# Patient Record
Sex: Female | Born: 1945 | Race: White | Hispanic: No | Marital: Married | Smoking: Former smoker
Health system: Southern US, Community
[De-identification: ages and names within clinical notes are randomized; demographics above are authoritative.]

## PROBLEM LIST (undated history)

## (undated) DIAGNOSIS — K56609 Unspecified intestinal obstruction, unspecified as to partial versus complete obstruction: Secondary | ICD-10-CM

## (undated) DIAGNOSIS — M545 Low back pain, unspecified: Secondary | ICD-10-CM

## (undated) DIAGNOSIS — F329 Major depressive disorder, single episode, unspecified: Secondary | ICD-10-CM

## (undated) DIAGNOSIS — T8859XA Other complications of anesthesia, initial encounter: Secondary | ICD-10-CM

## (undated) DIAGNOSIS — M1990 Unspecified osteoarthritis, unspecified site: Secondary | ICD-10-CM

## (undated) DIAGNOSIS — K589 Irritable bowel syndrome without diarrhea: Secondary | ICD-10-CM

## (undated) DIAGNOSIS — K5790 Diverticulosis of intestine, part unspecified, without perforation or abscess without bleeding: Secondary | ICD-10-CM

## (undated) DIAGNOSIS — G43909 Migraine, unspecified, not intractable, without status migrainosus: Secondary | ICD-10-CM

## (undated) DIAGNOSIS — M542 Cervicalgia: Secondary | ICD-10-CM

## (undated) DIAGNOSIS — S82202A Unspecified fracture of shaft of left tibia, initial encounter for closed fracture: Secondary | ICD-10-CM

## (undated) DIAGNOSIS — E7800 Pure hypercholesterolemia, unspecified: Secondary | ICD-10-CM

## (undated) DIAGNOSIS — K635 Polyp of colon: Secondary | ICD-10-CM

## (undated) DIAGNOSIS — J45909 Unspecified asthma, uncomplicated: Secondary | ICD-10-CM

## (undated) DIAGNOSIS — F419 Anxiety disorder, unspecified: Secondary | ICD-10-CM

## (undated) DIAGNOSIS — F32A Depression, unspecified: Secondary | ICD-10-CM

## (undated) DIAGNOSIS — J189 Pneumonia, unspecified organism: Secondary | ICD-10-CM

## (undated) DIAGNOSIS — G8929 Other chronic pain: Secondary | ICD-10-CM

## (undated) DIAGNOSIS — T4145XA Adverse effect of unspecified anesthetic, initial encounter: Secondary | ICD-10-CM

## (undated) DIAGNOSIS — I2510 Atherosclerotic heart disease of native coronary artery without angina pectoris: Secondary | ICD-10-CM

## (undated) HISTORY — PX: AUGMENTATION MAMMAPLASTY: SUR837

## (undated) HISTORY — PX: APPENDECTOMY: SHX54

## (undated) HISTORY — DX: Unspecified intestinal obstruction, unspecified as to partial versus complete obstruction: K56.609

## (undated) HISTORY — PX: NASAL SINUS SURGERY: SHX719

## (undated) HISTORY — PX: COLON SURGERY: SHX602

## (undated) HISTORY — DX: Polyp of colon: K63.5

## (undated) HISTORY — PX: BRACHIOPLASTY: SUR162

## (undated) HISTORY — DX: Pure hypercholesterolemia, unspecified: E78.00

## (undated) HISTORY — DX: Pneumonia, unspecified organism: J18.9

## (undated) HISTORY — PX: FRACTURE SURGERY: SHX138

## (undated) HISTORY — DX: Anxiety disorder, unspecified: F41.9

## (undated) HISTORY — DX: Unspecified osteoarthritis, unspecified site: M19.90

## (undated) HISTORY — DX: Irritable bowel syndrome, unspecified: K58.9

## (undated) HISTORY — PX: CARDIAC CATHETERIZATION: SHX172

## (undated) HISTORY — PX: PARTIAL COLECTOMY: SHX5273

## (undated) HISTORY — DX: Diverticulosis of intestine, part unspecified, without perforation or abscess without bleeding: K57.90

## (undated) HISTORY — PX: CHOLECYSTECTOMY OPEN: SUR202

## (undated) HISTORY — PX: EYE MUSCLE SURGERY: SHX370

## (undated) HISTORY — PX: TONSILLECTOMY: SUR1361

---

## 1998-04-03 ENCOUNTER — Encounter: Payer: Self-pay | Admitting: Family Medicine

## 1998-04-04 ENCOUNTER — Inpatient Hospital Stay (HOSPITAL_COMMUNITY): Admission: EM | Admit: 1998-04-04 | Discharge: 1998-04-05 | Payer: Self-pay | Admitting: Emergency Medicine

## 1999-01-29 ENCOUNTER — Other Ambulatory Visit: Admission: RE | Admit: 1999-01-29 | Discharge: 1999-01-29 | Payer: Self-pay | Admitting: *Deleted

## 2000-05-06 ENCOUNTER — Other Ambulatory Visit: Admission: RE | Admit: 2000-05-06 | Discharge: 2000-05-06 | Payer: Self-pay | Admitting: *Deleted

## 2001-11-23 ENCOUNTER — Other Ambulatory Visit: Admission: RE | Admit: 2001-11-23 | Discharge: 2001-11-23 | Payer: Self-pay | Admitting: Obstetrics and Gynecology

## 2002-11-30 ENCOUNTER — Other Ambulatory Visit: Admission: RE | Admit: 2002-11-30 | Discharge: 2002-11-30 | Payer: Self-pay | Admitting: Obstetrics and Gynecology

## 2004-02-18 ENCOUNTER — Other Ambulatory Visit: Admission: RE | Admit: 2004-02-18 | Discharge: 2004-02-18 | Payer: Self-pay | Admitting: Obstetrics and Gynecology

## 2005-01-09 ENCOUNTER — Ambulatory Visit (HOSPITAL_BASED_OUTPATIENT_CLINIC_OR_DEPARTMENT_OTHER): Admission: RE | Admit: 2005-01-09 | Discharge: 2005-01-09 | Payer: Self-pay | Admitting: Ophthalmology

## 2005-01-09 ENCOUNTER — Ambulatory Visit (HOSPITAL_COMMUNITY): Admission: RE | Admit: 2005-01-09 | Discharge: 2005-01-09 | Payer: Self-pay | Admitting: Ophthalmology

## 2005-05-14 ENCOUNTER — Other Ambulatory Visit: Admission: RE | Admit: 2005-05-14 | Discharge: 2005-05-14 | Payer: Self-pay | Admitting: Obstetrics and Gynecology

## 2006-09-23 ENCOUNTER — Encounter: Payer: Self-pay | Admitting: Internal Medicine

## 2006-09-23 ENCOUNTER — Other Ambulatory Visit: Admission: RE | Admit: 2006-09-23 | Discharge: 2006-09-23 | Payer: Self-pay | Admitting: Obstetrics & Gynecology

## 2006-10-07 ENCOUNTER — Encounter: Payer: Self-pay | Admitting: Internal Medicine

## 2007-05-23 ENCOUNTER — Ambulatory Visit: Payer: Self-pay | Admitting: Internal Medicine

## 2007-06-17 ENCOUNTER — Encounter: Payer: Self-pay | Admitting: Internal Medicine

## 2007-06-17 ENCOUNTER — Ambulatory Visit: Payer: Self-pay | Admitting: Internal Medicine

## 2007-06-17 LAB — HM COLONOSCOPY

## 2007-06-22 LAB — HM MAMMOGRAPHY: HM Mammogram: NORMAL

## 2007-08-11 ENCOUNTER — Encounter: Admission: RE | Admit: 2007-08-11 | Discharge: 2007-08-11 | Payer: Self-pay | Admitting: Family Medicine

## 2007-11-25 ENCOUNTER — Encounter: Payer: Self-pay | Admitting: Endocrinology

## 2007-11-25 ENCOUNTER — Other Ambulatory Visit: Admission: RE | Admit: 2007-11-25 | Discharge: 2007-11-25 | Payer: Self-pay | Admitting: Obstetrics and Gynecology

## 2008-03-16 ENCOUNTER — Ambulatory Visit: Payer: Self-pay | Admitting: Endocrinology

## 2008-03-16 DIAGNOSIS — Z8719 Personal history of other diseases of the digestive system: Secondary | ICD-10-CM | POA: Insufficient documentation

## 2008-03-16 DIAGNOSIS — E875 Hyperkalemia: Secondary | ICD-10-CM | POA: Insufficient documentation

## 2008-03-16 DIAGNOSIS — R51 Headache: Secondary | ICD-10-CM | POA: Insufficient documentation

## 2008-03-16 DIAGNOSIS — Z9189 Other specified personal risk factors, not elsewhere classified: Secondary | ICD-10-CM | POA: Insufficient documentation

## 2008-03-16 DIAGNOSIS — R519 Headache, unspecified: Secondary | ICD-10-CM | POA: Insufficient documentation

## 2008-03-16 DIAGNOSIS — R7309 Other abnormal glucose: Secondary | ICD-10-CM | POA: Insufficient documentation

## 2008-03-16 DIAGNOSIS — L659 Nonscarring hair loss, unspecified: Secondary | ICD-10-CM | POA: Insufficient documentation

## 2008-03-16 LAB — CONVERTED CEMR LAB
BUN: 18 mg/dL (ref 6–23)
CO2: 31 meq/L (ref 19–32)
Calcium: 9.6 mg/dL (ref 8.4–10.5)
Chloride: 107 meq/L (ref 96–112)
Cortisol, Plasma: 18.7 ug/dL
Cortisol, Plasma: 27 ug/dL
Creatinine, Ser: 1 mg/dL (ref 0.4–1.2)
Folate: 10.3 ng/mL
GFR calc Af Amer: 73 mL/min
GFR calc non Af Amer: 60 mL/min
Glucose, Bld: 102 mg/dL — ABNORMAL HIGH (ref 70–99)
Potassium: 4.2 meq/L (ref 3.5–5.1)
Sodium: 142 meq/L (ref 135–145)
Vitamin B-12: 564 pg/mL (ref 211–911)

## 2008-03-18 DIAGNOSIS — G43009 Migraine without aura, not intractable, without status migrainosus: Secondary | ICD-10-CM | POA: Insufficient documentation

## 2008-03-18 DIAGNOSIS — E7800 Pure hypercholesterolemia, unspecified: Secondary | ICD-10-CM | POA: Insufficient documentation

## 2008-05-01 ENCOUNTER — Inpatient Hospital Stay (HOSPITAL_COMMUNITY): Admission: EM | Admit: 2008-05-01 | Discharge: 2008-05-07 | Payer: Self-pay | Admitting: Emergency Medicine

## 2008-10-01 ENCOUNTER — Encounter: Payer: Self-pay | Admitting: Internal Medicine

## 2008-11-08 ENCOUNTER — Telehealth: Payer: Self-pay | Admitting: Internal Medicine

## 2008-11-23 ENCOUNTER — Ambulatory Visit (HOSPITAL_BASED_OUTPATIENT_CLINIC_OR_DEPARTMENT_OTHER): Admission: RE | Admit: 2008-11-23 | Discharge: 2008-11-23 | Payer: Self-pay | Admitting: Ophthalmology

## 2009-01-09 ENCOUNTER — Encounter: Payer: Self-pay | Admitting: Internal Medicine

## 2009-04-08 ENCOUNTER — Encounter: Payer: Self-pay | Admitting: Internal Medicine

## 2009-06-04 ENCOUNTER — Encounter: Payer: Self-pay | Admitting: Internal Medicine

## 2009-06-25 ENCOUNTER — Encounter: Payer: Self-pay | Admitting: Internal Medicine

## 2009-06-27 ENCOUNTER — Encounter: Payer: Self-pay | Admitting: Internal Medicine

## 2009-07-19 ENCOUNTER — Ambulatory Visit: Payer: Self-pay | Admitting: Internal Medicine

## 2009-07-19 DIAGNOSIS — M542 Cervicalgia: Secondary | ICD-10-CM | POA: Insufficient documentation

## 2009-07-24 ENCOUNTER — Telehealth: Payer: Self-pay | Admitting: Internal Medicine

## 2009-07-25 DIAGNOSIS — F329 Major depressive disorder, single episode, unspecified: Secondary | ICD-10-CM | POA: Insufficient documentation

## 2009-07-25 DIAGNOSIS — F32A Depression, unspecified: Secondary | ICD-10-CM | POA: Insufficient documentation

## 2009-08-01 ENCOUNTER — Telehealth: Payer: Self-pay | Admitting: Internal Medicine

## 2009-08-08 ENCOUNTER — Ambulatory Visit: Payer: Self-pay | Admitting: Internal Medicine

## 2009-08-08 DIAGNOSIS — R030 Elevated blood-pressure reading, without diagnosis of hypertension: Secondary | ICD-10-CM | POA: Insufficient documentation

## 2009-08-08 LAB — CONVERTED CEMR LAB
BUN: 29 mg/dL — ABNORMAL HIGH (ref 6–23)
CO2: 30 meq/L (ref 19–32)
Calcium: 9.5 mg/dL (ref 8.4–10.5)
Chloride: 101 meq/L (ref 96–112)
Cholesterol: 221 mg/dL — ABNORMAL HIGH (ref 0–200)
Creatinine, Ser: 1 mg/dL (ref 0.4–1.2)
Direct LDL: 125.2 mg/dL
Free T4: 0.8 ng/dL (ref 0.6–1.6)
GFR calc non Af Amer: 59.76 mL/min (ref >60)
Glucose, Bld: 115 mg/dL — ABNORMAL HIGH (ref 70–99)
HDL: 77.2 mg/dL (ref >39.00)
Hgb A1c MFr Bld: 5.7 % (ref 4.6–6.5)
Potassium: 4 meq/L (ref 3.5–5.1)
Sodium: 139 meq/L (ref 135–145)
TSH: 0.57 microintl units/mL (ref 0.35–5.50)
Tissue Transglutaminase Ab, IgA: 0.5 units (ref <7)
Total CHOL/HDL Ratio: 3
Triglycerides: 56 mg/dL (ref 0.0–149.0)
VLDL: 11.2 mg/dL (ref 0.0–40.0)

## 2009-08-13 ENCOUNTER — Telehealth: Payer: Self-pay | Admitting: Internal Medicine

## 2009-09-03 ENCOUNTER — Telehealth: Payer: Self-pay | Admitting: Internal Medicine

## 2009-09-05 ENCOUNTER — Ambulatory Visit (HOSPITAL_COMMUNITY): Admission: RE | Admit: 2009-09-05 | Discharge: 2009-09-05 | Payer: Self-pay | Admitting: Otolaryngology

## 2009-09-05 ENCOUNTER — Encounter (INDEPENDENT_AMBULATORY_CARE_PROVIDER_SITE_OTHER): Payer: Self-pay | Admitting: Otolaryngology

## 2009-11-04 ENCOUNTER — Telehealth: Payer: Self-pay | Admitting: Internal Medicine

## 2009-11-12 ENCOUNTER — Ambulatory Visit: Payer: Self-pay | Admitting: Internal Medicine

## 2009-11-12 DIAGNOSIS — K589 Irritable bowel syndrome without diarrhea: Secondary | ICD-10-CM | POA: Insufficient documentation

## 2009-11-12 DIAGNOSIS — J309 Allergic rhinitis, unspecified: Secondary | ICD-10-CM | POA: Insufficient documentation

## 2009-12-13 ENCOUNTER — Telehealth: Payer: Self-pay | Admitting: Internal Medicine

## 2010-01-14 ENCOUNTER — Telehealth: Payer: Self-pay | Admitting: Internal Medicine

## 2010-01-18 ENCOUNTER — Encounter: Payer: Self-pay | Admitting: Internal Medicine

## 2010-04-24 ENCOUNTER — Ambulatory Visit: Payer: Self-pay | Admitting: Internal Medicine

## 2010-04-24 LAB — CONVERTED CEMR LAB
ALT: 14 units/L (ref 0–35)
AST: 19 units/L (ref 0–37)
Albumin: 4 g/dL (ref 3.5–5.2)
Alkaline Phosphatase: 47 units/L (ref 39–117)
BUN: 21 mg/dL (ref 6–23)
Basophils Absolute: 0 10*3/uL (ref 0.0–0.1)
Basophils Relative: 0.4 % (ref 0.0–3.0)
Bilirubin, Direct: 0.1 mg/dL (ref 0.0–0.3)
CO2: 31 meq/L (ref 19–32)
Calcium: 9.4 mg/dL (ref 8.4–10.5)
Chloride: 102 meq/L (ref 96–112)
Cholesterol: 189 mg/dL (ref 0–200)
Creatinine, Ser: 1 mg/dL (ref 0.4–1.2)
Eosinophils Absolute: 0.2 10*3/uL (ref 0.0–0.7)
Eosinophils Relative: 3.3 % (ref 0.0–5.0)
GFR calc non Af Amer: 58.94 mL/min (ref >60)
Glucose, Bld: 91 mg/dL (ref 70–99)
HCT: 39.3 % (ref 36.0–46.0)
HDL: 61.3 mg/dL (ref >39.00)
Hemoglobin: 13.2 g/dL (ref 12.0–15.0)
Hgb A1c MFr Bld: 5.8 % (ref 4.6–6.5)
LDL Cholesterol: 115 mg/dL — ABNORMAL HIGH (ref 0–99)
Lymphocytes Relative: 20.4 % (ref 12.0–46.0)
Lymphs Abs: 1.5 10*3/uL (ref 0.7–4.0)
MCHC: 33.6 g/dL (ref 30.0–36.0)
MCV: 92.9 fL (ref 78.0–100.0)
Monocytes Absolute: 0.5 10*3/uL (ref 0.1–1.0)
Monocytes Relative: 7.1 % (ref 3.0–12.0)
Neutro Abs: 5.2 10*3/uL (ref 1.4–7.7)
Neutrophils Relative %: 68.8 % (ref 43.0–77.0)
Platelets: 300 10*3/uL (ref 150.0–400.0)
Potassium: 4.8 meq/L (ref 3.5–5.1)
RBC: 4.23 M/uL (ref 3.87–5.11)
RDW: 12.5 % (ref 11.5–14.6)
Sodium: 140 meq/L (ref 135–145)
TSH: 0.67 microintl units/mL (ref 0.35–5.50)
Total Bilirubin: 0.5 mg/dL (ref 0.3–1.2)
Total CHOL/HDL Ratio: 3
Total Protein: 6.7 g/dL (ref 6.0–8.3)
Triglycerides: 66 mg/dL (ref 0.0–149.0)
VLDL: 13.2 mg/dL (ref 0.0–40.0)
WBC: 7.5 10*3/uL (ref 4.5–10.5)

## 2010-04-25 ENCOUNTER — Telehealth: Payer: Self-pay | Admitting: Internal Medicine

## 2010-04-25 ENCOUNTER — Ambulatory Visit: Payer: Self-pay | Admitting: Internal Medicine

## 2010-06-03 ENCOUNTER — Ambulatory Visit: Payer: Self-pay | Admitting: Internal Medicine

## 2010-06-03 ENCOUNTER — Encounter: Payer: Self-pay | Admitting: Internal Medicine

## 2010-06-10 ENCOUNTER — Encounter: Payer: Self-pay | Admitting: Internal Medicine

## 2010-06-10 ENCOUNTER — Ambulatory Visit: Payer: Self-pay | Admitting: Internal Medicine

## 2010-06-30 ENCOUNTER — Encounter: Payer: Self-pay | Admitting: Internal Medicine

## 2010-07-08 ENCOUNTER — Ambulatory Visit
Admission: RE | Admit: 2010-07-08 | Discharge: 2010-07-08 | Payer: Self-pay | Source: Home / Self Care | Attending: Internal Medicine | Admitting: Internal Medicine

## 2010-07-08 ENCOUNTER — Ambulatory Visit: Payer: Self-pay | Admitting: Internal Medicine

## 2010-07-08 DIAGNOSIS — M810 Age-related osteoporosis without current pathological fracture: Secondary | ICD-10-CM | POA: Insufficient documentation

## 2010-07-08 LAB — CONVERTED CEMR LAB
BUN: 16 mg/dL (ref 6–23)
Basophils Absolute: 0 10*3/uL (ref 0.0–0.1)
Basophils Relative: 0.1 % (ref 0.0–3.0)
CO2: 28 meq/L (ref 19–32)
Calcium: 9.3 mg/dL (ref 8.4–10.5)
Chloride: 102 meq/L (ref 96–112)
Creatinine, Ser: 0.7 mg/dL (ref 0.4–1.2)
Eosinophils Absolute: 0.2 10*3/uL (ref 0.0–0.7)
Eosinophils Relative: 1.5 % (ref 0.0–5.0)
GFR calc non Af Amer: 84.33 mL/min (ref >60.00)
Glucose, Bld: 84 mg/dL (ref 70–99)
HCT: 40.6 % (ref 36.0–46.0)
Hemoglobin: 13.6 g/dL (ref 12.0–15.0)
Lymphocytes Relative: 13 % (ref 12.0–46.0)
Lymphs Abs: 1.4 10*3/uL (ref 0.7–4.0)
MCHC: 33.6 g/dL (ref 30.0–36.0)
MCV: 91.4 fL (ref 78.0–100.0)
Monocytes Absolute: 1 10*3/uL (ref 0.1–1.0)
Monocytes Relative: 9.5 % (ref 3.0–12.0)
Neutro Abs: 8.4 10*3/uL — ABNORMAL HIGH (ref 1.4–7.7)
Neutrophils Relative %: 75.9 % (ref 43.0–77.0)
Platelets: 298 10*3/uL (ref 150.0–400.0)
Potassium: 4.2 meq/L (ref 3.5–5.1)
RBC: 4.45 M/uL (ref 3.87–5.11)
RDW: 12.3 % (ref 11.5–14.6)
Sodium: 138 meq/L (ref 135–145)
WBC: 11 10*3/uL — ABNORMAL HIGH (ref 4.5–10.5)

## 2010-07-11 ENCOUNTER — Telehealth: Payer: Self-pay | Admitting: Internal Medicine

## 2010-08-14 NOTE — Letter (Signed)
Summary: Date Range:04-03-09 to 06-27-09/[facility]  Date Range:04-03-09 to 06-27-09/[facility]   Imported By: Sherian Rein 09/02/2009 08:36:06  _____________________________________________________________________  External Attachment:    Type:   Image     Comment:   External Document

## 2010-08-14 NOTE — Assessment & Plan Note (Signed)
Summary: f/u BP / SD   Vital Signs:  Patient profile:   65 year old female Height:      62 inches Weight:      119 pounds BMI:     21.84 O2 Sat:      99 % on Room air Temp:     98.4 degrees F oral Pulse rate:   69 / minute BP sitting:   124 / 88  (left arm) Cuff size:   regular  O2 Flow:  Room air CC: Follow-up visit for BP/aj   Primary Care Provider:  Sherril Cong  CC:  Follow-up visit for BP/aj.  History of Present Illness: Patient returns to discuss BP readings (see scanned report) with a range from 110/62 to 151/ 86. There was a difference comparing right and left arms with the left arm running higher.  she is asymptomatic. These readings are tending to run higher than her historical levels. No driving factors.   Labs from outside have not been received. However, she believes that the last time her cholesterol was checked was in '09.   Her principal concern is with a loose bowel habit. She has not had a good response to fiber. She does have bloating and gas and borborygmi. She has no blood in her stool or mucus. this seems to be getting worse. She also has a heightened gastro-colic reflex. This is not food related. She is taking align and was taking an otc product for intestinal gas problems.   Seeing Dr. Haroldine Laws for chronic sinus disease and recurrent infections. He has proposed sinus surgery - to be done in the next 6 weeks.   Current Medications (verified): 1)  Fluoxetine Hcl 20 Mg Tabs (Fluoxetine Hcl) .... Take 1 By Mouth Qd 2)  Multivitamins  Tabs (Multiple Vitamin) .... Take 1 By Mouth Qd 3)  Alprazolam 0.5 Mg Tabs (Alprazolam) .... Take 1 At Bedtime 4)  Trazodone Hcl 50 Mg Tabs (Trazodone Hcl) .... Take 1 By Mouth At Bedtime 5)  Indomethacin 25 Mg Caps (Indomethacin) .... 4 Caps Daily 6)  Imitrex 6 Mg/0.74ml Soln (Sumatriptan Succinate) .... Subcutaneously Injection As Needed Migraine  Allergies (verified): No Known Drug Allergies PMH-FH-SH reviewed-no changes  except otherwise noted  Review of Systems GI:  Complains of abdominal pain, change in bowel habits, diarrhea, gas, and nausea.  Physical Exam  General:  Well-developed,well-nourished,in no acute distress; alert,appropriate and cooperative throughout examination Head:  normocephalic and atraumatic.   Eyes:  pupils equal, pupils round, corneas and lenses clear, and no injection.   Lungs:  normal respiratory effort and normal breath sounds.   Heart:  normal rate and regular rhythm.   Neurologic:  alert & oriented X3, cranial nerves II-XII intact, and gait normal.   Skin:  turgor normal and color normal.   Psych:  Oriented X3, memory intact for recent and remote, and normally interactive.     Impression & Recommendations:  Problem # 1:  DIARRHEA (ICD-787.91)  Persistent for months. No blood or real pain making IBD unlikely. Concern for celiac disease vs IBS.  Plan - celiac panel           trial of dicyclomine 10mg  three times a day - may titrate up           if celiac panel negative and no improvement witth dicycylomine - GI referral.  Orders: T-Celiac Disease Ab Evaluation (8002) TLB-T4 (Thyrox), Free [phone]) TLB-TSH (Thyroid Stimulating Hormone) (84443-TSH)  Addendum - thyroid labs normal. Celiac panel pending  Problem # 2:  ELEVATED BP READING WITHOUT DX HYPERTENSION (ICD-796.2) Reviewed readings as well as thinkng on the treatment of pre-hypetension.  Plan - watchful waiting at this time.  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 Crossley  Problem # 4:  HYPERCHOLESTEROLEMIA (ICD-272.0)  No recent labs.  Plan - lipid panel with recommendations to follow.  Orders: TLB-Lipid Panel (80061-LIPID)  Addendum - HDL is very robust, LDL is less than 130 which is the goal, LDL/HDL ratio is protective. No need for any treatment  Complete Medication List: 1)  Fluoxetine Hcl 40 Mg Caps (Fluoxetine hcl) .Marland Kitchen.. 1 by mouth once daily 2)  Multivitamins Tabs  (Multiple vitamin) .... Take 1 by mouth qd 3)  Alprazolam 0.5 Mg Tabs (Alprazolam) .... Take 1 at bedtime 4)  Trazodone Hcl 50 Mg Tabs (Trazodone hcl) .... Take 1 by mouth at bedtime 5)  Indomethacin 25 Mg Caps (Indomethacin) .... 4 caps daily 6)  Imitrex 6 Mg/0.44ml Soln (Sumatriptan succinate) .... Subcutaneously injection as needed migraine 7)  Dicyclomine Hcl 10 Mg Caps (Dicyclomine hcl) .Marland Kitchen.. 1 by mouth three times a day; may titrate to symptoms  Other Orders: TLB-BMP (Basic Metabolic Panel-BMET) (80048-METABOL) TLB-A1C / Hgb A1C (Glycohemoglobin) (83036-A1C)  Patient: [name] Note: All result statuses are Final unless otherwise noted.  Tests: (1) Lipid Panel (LIPID)   Cholesterol          [H]  221 mg/dL                   1-610     ATP III Classification            Desirable:  < 200 mg/dL                    Borderline High:  200 - 239 mg/dL               High:  > = 240 mg/dL   Triglycerides             56.0 mg/dL                  9.6-045.4     Normal:  <150 mg/dL     Borderline High:  098 - 199 mg/dL   HDL                       11.91 mg/dL                 >47.82   VLDL Cholesterol          11.2 mg/dL                  9.5-62.1  CHO/HDL Ratio:  CHD Risk                             3                    Men          Women     1/2 Average Risk     3.4          3.3     Average Risk          5.0          4.4     2X Average Risk          9.6  7.1     3X Average Risk          15.0          11.0                           Tests: (2) BMP (METABOL)   Sodium                    139 mEq/L                   135-145   Potassium                 4.0 mEq/L                   3.5-5.1   Chloride                  101 mEq/L                   96-112   Carbon Dioxide            30 mEq/L                    19-32   Glucose              [H]  115 mg/dL                   56-21   BUN                  [H]  29 mg/dL                    3-08   Creatinine                1.0 mg/dL                    6.5-7.8   Calcium                   9.5 mg/dL                   4.6-96.2   GFR                       59.76 mL/min                >60  Tests: (3) Hemoglobin A1C (A1C)   Hemoglobin A1C            5.7 %                       4.6-6.5     Glycemic Control Guidelines for People with Diabetes:     Non Diabetic:  <6%     Goal of Therapy: <7%     Additional Action Suggested:  >8%   Tests: (4) T4, Free (FT4R)   Free T4                   0.8 ng/dL                   9.5-2.8  Tests: (5) TSH (TSH)   FastTSH                   0.57 uIU/mL  0.35-5.50  Tests: (6) Cholesterol LDL - Direct (DIRLDL)  Cholesterol LDL - Direct                             125.2 mg/dL     Optimal:  <161 mg/dL     Near or Above Optimal:  100-129 mg/dL     Borderline High:  096-045 mg/dL     High:  409-811 mg/dL     Very High:  >914 mg/dLPrescriptions: ALPRAZOLAM 0.5 MG TABS (ALPRAZOLAM) take 1 at bedtime  #30 x 5   Entered and Authorized by:   Jacques Navy MD   Signed by:   Jacques Navy MD on 08/08/2009   Method used:   Handwritten   RxID:   [id number] FLUOXETINE HCL 40 MG CAPS (FLUOXETINE HCL) 1 by mouth once daily  #30 x 12   Entered and Authorized by:   Jacques Navy MD   Signed by:   Jacques Navy MD on 08/08/2009   Method used:   Electronically to        [facility]  [phone]* (retail)       [address]       [place], [place]  [address]       Ph: [phone] or [phone]       Fax: [phone]   RxID:   [id number] DICYCLOMINE HCL 10 MG CAPS (DICYCLOMINE HCL) 1 by mouth three times a day; may titrate to symptoms  #30 x 1   Entered and Authorized by:   Jacques Navy MD   Signed by:   Jacques Navy MD on 08/08/2009   Method used:   Electronically to        [facility]  [phone]* (retail)       [address]       [place], [place]  [address]       Ph: [phone] or [phone]       Fax:  [phone]   RxID:   [id number]    Immunization History:  Influenza Immunization History:    Influenza:  historical (04/12/2009)

## 2010-08-14 NOTE — Progress Notes (Signed)
Summary: Refill--Alprazolam  Phone Note Refill Request Message from:  Fax from Pharmacy on December 13, 2009 1:51 PM  Refills Requested: Medication #1:  ALPRAZOLAM 0.5 MG TABS 1 by mouth bid.   Supply Requested: 3 months Next Appointment Scheduled: none Initial call taken by: Lucious Groves,  December 13, 2009 1:51 PM  Follow-up for Phone Call        ok Follow-up by: Etta Grandchild MD,  December 13, 2009 2:02 PM  Additional Follow-up for Phone Call Additional follow up Details #1::        done  Additional Follow-up by: Lucious Groves,  December 13, 2009 3:44 PM    Prescriptions: ALPRAZOLAM 0.5 MG TABS (ALPRAZOLAM) 1 by mouth bid  #60 x 0   Entered by:   Lucious Groves   Authorized by:   Etta Grandchild MD   Signed by:   Lucious Groves on 12/13/2009   Method used:   Telephoned to ...       [facility]  [address]  [phone]* (retail)       [address]       [place], [place]  [address]       Ph: [phone] or [phone]       Fax: [phone]   RxID:   [id number]

## 2010-08-14 NOTE — Progress Notes (Signed)
Summary: Results  Phone Note Outgoing Call   Reason for Call: Discuss lab or test results Summary of Call: please call patient: test for celiac disease, as a cause of diarrhea, was negative.  Thanks Initial call taken by: Jacques Navy MD,  August 13, 2009 3:55 AM  Follow-up for Phone Call        left message for pt to call back Follow-up by: Ami Bullins CMA,  August 13, 2009 8:26 AM  Additional Follow-up for Phone Call Additional follow up Details #1::        Patient notified, and wanted MD to know that the med given for IBS helped immediately. Patient would like to know how the rest of her labs were (i.e. cholesterol, etc.). Please advise. Additional Follow-up by: Lucious Groves,  August 13, 2009 11:31 AM    Additional Follow-up for Phone Call Additional follow up Details #2::    office note with all her labs has been mailed. She should receive it soon. Follow-up by: Jacques Navy MD,  August 13, 2009 1:31 PM  Additional Follow-up for Phone Call Additional follow up Details #3:: Details for Additional Follow-up Action Taken: pt informed via Hm VM. pt told to call back with any further questions or concerns Additional Follow-up by: Margaret Pyle, CMA,  August 13, 2009 2:21 PM

## 2010-08-14 NOTE — Assessment & Plan Note (Signed)
Summary: NEW / [employer] / # / CD   Vital Signs:  Patient profile:   65 year old female Height:      62 inches Weight:      119 pounds BMI:     21.84 O2 Sat:      97 % on Room air Temp:     97.9 degrees F oral Pulse rate:   70 / minute BP sitting:   138 / 86  (left arm) Cuff size:   regular  Vitals Entered By: Bill Salinas CMA (July 19, 2009 2:41 PM)  O2 Flow:  Room air CC: pt here to est care with primary/ ab   Primary Care Provider:  Sherril Cong  CC:  pt here to est care with primary/ ab.  History of Present Illness: Patient presents to establish for on-going continuity care. she has no specific medical problems. She does have arthritic problems back and neck. She does report frequent diarrhea -routine is BM every 2-3 days, but it will be watery and loose. She had colonosocopy '08 Dr. Juanda Chance - diverticulosis only. Reviewed lab s and endocrine work-up for alopecia and low energy: normal labs including A1C. Labs from '09 GYN office reviewed and normal except for LDL of 162 (goal of 130 or less). She has had recent labs by Dr. Uvaldo Rising results not available at this time.   Current Medications (verified): 1)  Fluoxetine Hcl 20 Mg Tabs (Fluoxetine Hcl) .... Take 1 By Mouth Qd 2)  Multivitamins  Tabs (Multiple Vitamin) .... Take 1 By Mouth Qd 3)  Alprazolam 0.5 Mg Tabs (Alprazolam) .... Take 1 At Bedtime 4)  Trazodone Hcl 50 Mg Tabs (Trazodone Hcl) .... Take 1 By Mouth At Bedtime 5)  Indomethacin 25 Mg Caps (Indomethacin) .... 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 Maple Hudson (pediatric opthal)                GI- Dr. Lina Sar                Ortho - Dr. Darrelyn Hillock                ENT - Dr. 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  marriage in good health.   Review of Systems       The patient complains of incontinence.  The patient denies anorexia, fever, weight loss, weight gain, vision loss, decreased hearing, chest pain, syncope, dyspnea on exertion, peripheral edema, hemoptysis, abdominal pain, melena, severe indigestion/heartburn, difficulty walking, depression, enlarged  lymph nodes, and breast masses.         stress incontinence.  Physical Exam  General:  Well-developed,well-nourished,in no acute distress; alert,appropriate and cooperative throughout examination Head:  Normocephalic and atraumatic without obvious abnormalities. No apparent alopecia or balding. Eyes:  No corneal or conjunctival inflammation noted. EOMI. Perrla. Funduscopic exam benign, without hemorrhages, exudates or papilledema. Vision grossly normal. Ears:  External ear exam shows no significant lesions or deformities.  Otoscopic examination reveals clear canals, tympanic membranes are intact bilaterally without bulging, retraction, inflammation or discharge. Hearing is grossly normal bilaterally. Nose:  External nasal examination shows no deformity or inflammation. Nasal mucosa are pink and moist without lesions or exudates. Mouth:   Oral mucosa and oropharynx without lesions or exudates.  Teeth in good repair. Neck:  supple, full ROM, and no masses.   Chest Wall:  no deformities and no tenderness.   Breasts:  deferred Lungs:  Normal respiratory effort, chest expands symmetrically. Lungs are clear to auscultation, no crackles or wheezes. Heart:  Normal rate and regular rhythm. S1 and S2 normal without gallop, murmur, click, rub or other extra sounds. Abdomen:  soft, non-tender, normal bowel sounds, no distention, no guarding, no rigidity, and no hepatomegaly.   Genitalia:  deferred Msk:  normal ROM, no joint tenderness, no joint swelling, no joint warmth, no joint deformities, and no joint instability.   Pulses:  2+ radial and DP pulses Extremities:  No clubbing, cyanosis, edema, or deformity noted with normal full range of motion of all joints.   Neurologic:  alert & oriented X3, cranial nerves II-XII intact, strength normal in all extremities, gait normal, and DTRs symmetrical and normal.   Skin:  turgor normal, color normal, no rashes, no petechiae, and no ulcerations.   Cervical Nodes:  No lymphadenopathy noted Psych:  Cognition and judgment appear intact. Alert and cooperative with normal attention span and concentration. No apparent delusions, illusions, hallucinations   Impression & Recommendations:  Problem # 1:  LOW BACK PAIN, CHRONIC (ICD-724.2) Patient is not in significant pain at today's visit and has normal ROM and movement.  Her updated medication list for this problem includes:    Indomethacin 25 Mg Caps (Indomethacin) .Marland KitchenMarland KitchenMarland KitchenMarland Kitchen 4 caps daily  Problem # 2:  HYPERCHOLESTEROLEMIA (ICD-272.0) Reviewed labs from '06 scanned in from Gyn office: Total cholesterol 259, HDL 80, LDL 161. Need to review recent labs at her previous physicians. If no recent lipid profile will need to order the same with recommendations to follow in either case. There is a strong positive family history of athersclerosis related  disease.  Problem # 3:  HYPERGLYCEMIA (ICD-790.29) Reviewed labs from '06 scanned in from gyn office: A1C 5.9% which is normal. there is a positive family history of diabetes. Will need recent serum glucose or A1C.  Problem # 4:  COMMON MIGRAINE (ICD-346.10) Existing diagnosis. No 5HT products on med list. For recurrent headache, if migraine in nature will try sumatriptan or similar product.  Her updated medication list for this problem includes:    Indomethacin 25 Mg Caps (Indomethacin) .Marland KitchenMarland KitchenMarland KitchenMarland Kitchen 4 caps daily  Problem # 5:  DIVERTICULITIS, HX OF (ICD-V12.79) No current symptoms. Last colonoscopy Dec '08.  Problem # 6:  Preventive Health Care (ICD-V70.0) Unremarkable exam. Current with colorectal cancer screening and breast cancer screening. She is current with her gynecologist. Labs recently done have been requested.  In summary - a very pleasant woman whose mother and sister were patients here who is now established for on-going care. Will review  her outside labs when available. She will return  in 4-8 weeks for follow -up.  Complete Medication List: 1)  Fluoxetine Hcl 20 Mg Tabs (Fluoxetine hcl) .... Take 1 by mouth qd 2)  Multivitamins Tabs (Multiple vitamin) .... Take 1 by mouth qd 3)  Alprazolam 0.5 Mg Tabs (Alprazolam) .... Take 1 at bedtime 4)  Trazodone Hcl 50 Mg Tabs (Trazodone hcl) .... Take 1 by mouth at bedtime 5)  Indomethacin 25 Mg Caps (Indomethacin) .... 4 caps daily   Preventive Care Screening  Colonoscopy:    Date:  06/17/2007    Results:  Diverticulosis

## 2010-08-14 NOTE — Letter (Signed)
Summary: [facility]  [facility]   Imported By: Lester [place] 01/22/2010 09:12:05  _____________________________________________________________________  External Attachment:    Type:   Image     Comment:   External Document

## 2010-08-14 NOTE — Miscellaneous (Signed)
Summary: Pt Vaccination Record/[facility]  Pt Vaccination Record/[facility]   Imported By: Sherian Rein 09/02/2009 08:32:59  _____________________________________________________________________  External Attachment:    Type:   Image     Comment:   External Document

## 2010-08-14 NOTE — Assessment & Plan Note (Signed)
Summary: OV--PER PT---STC   Vital Signs:  Patient profile:   65 year old female Height:      63 inches Weight:      122 pounds BMI:     21.69 O2 Sat:      98 % on Room air Temp:     99.2 degrees F oral Pulse rate:   83 / minute BP sitting:   148 / 90  (left arm) Cuff size:   regular  Vitals Entered By: Bill Salinas CMA (July 08, 2010 1:02 PM)  O2 Flow:  Room air CC: pt here with c/o weakness, fatigue, chills, sore throat and productive cough (producing green mucous) x 1 weeks with a fever./ ab   Primary Care Provider:  Jacques Navy MD  CC:  pt here with c/o weakness, fatigue, chills, and sore throat and productive cough (producing green mucous) x 1 weeks with a fever./ ab.  History of Present Illness: Patient presents with a 7-10 h/o high fevers to 103, cough-productive of thick and dark sputum. increased SOB, rigors. She gives a history of pneumonia in the past and that this feels similar. Denies any nausea or vomiting. she has had a sore, feels like her neck glands are swollen, she has pain in the ears. She has been very weak and bedbound for several days. She has been taking APAP for fever, no other OTC meds, no cough syrups or preparations.  Current Medications (verified): 1)  Fluoxetine Hcl 40 Mg Caps (Fluoxetine Hcl) .Marland Kitchen.. 1 By Mouth Once Daily 2)  Multivitamins  Tabs (Multiple Vitamin) .... Take 1 By Mouth Qd 3)  Trazodone Hcl 50 Mg Tabs (Trazodone Hcl) .... Take 1 By Mouth At Bedtime 4)  Indomethacin 25 Mg Caps (Indomethacin) .Marland Kitchen.. 1 Cap Daily 5)  Imitrex 6 Mg/0.51ml Soln (Sumatriptan Succinate) .... Subcutaneously Injection As Needed Migraine 6)  Alprazolam 0.5 Mg Tabs (Alprazolam) .Marland Kitchen.. 1 By Mouth Bid 7)  Valacyclovir Hcl 1 Gm Tabs (Valacyclovir Hcl) .Marland Kitchen.. 1 Tablet Two Times A Day 8)  Nitrofurantoin Monohyd Macro 100 Mg Caps (Nitrofurantoin Monohyd Macro) .Marland Kitchen.. 1 Capsule Two Times A Day 9)  Maxalt 10 Mg  Tabs (Rizatriptan Benzoate) .... 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 Maple Hudson (pediatric opthal)                GI- Dr. Lina Sar                Ortho - Dr. Darrelyn Hillock                ENT - Dr. 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  marriage in good health.   Review of Systems       The patient complains of anorexia, fever, hoarseness, chest pain, dyspnea on exertion, and prolonged cough.  The patient denies weight loss, weight gain, decreased hearing, peripheral edema, hemoptysis, abdominal pain, severe indigestion/heartburn, muscle weakness, difficulty walking, abnormal bleeding, and enlarged lymph nodes.     Physical Exam  General:  Well-developed,well-nourished,in no acute distress; alert,appropriate and cooperative throughout examination Head:  normocephalic and atraumatic.  No tenderness to percussion over the frontal sinuses Eyes:  C&S clear Ears:  R ear normal and L ear normal.   Nose:  no external deformity, no external erythema, and no nasal discharge.   Mouth:  good dentition, pharynx pink and moist, no exudates, no posterior lymphoid hypertrophy, no tongue abnormalities, and no leukoplakia.   Msk:  no joint tenderness, no joint swelling, and no redness over joints.   Pulses:  2+radial Neurologic:  alert & oriented X3, cranial nerves II-XII intact, sensation intact to pinprick, and gait normal.   Skin:  turgor normal, color normal, no rashes, and no ecchymoses.   Cervical Nodes:  no anterior cervical adenopathy and no posterior cervical adenopathy.   Psych:  Oriented X3, memory intact for recent and remote, and normally interactive.     Impression & Recommendations:  Problem # 1:  COUGH (ICD-786.2)  patient with fever, cough, sob but clear lungs suggestive of bronchitis. Her is hx is remarkable for illness with leukocytosis and cough.  Plan PA-Lat CXR         Lab - CBCD, metabolic         Doxycycline 100mg  two times a day x10         robitussin DM         APAP, hydrate, vit C  Orders: TLB-CBC Platelet - w/Differential (85025-CBCD) TLB-BMP (Basic Metabolic Panel-BMET) (80048-METABOL) T-2 View CXR (71020TC)  addendum: CXr NAD                   WBC 11,000 with normal diff.  Problem # 2:  OSTEOPOROSIS (ICD-733.00) Reviewed patient DXA scan with and reviewed scoring system. she does have osteoporosis of the hip, one hip being  T-2.4 = osteopenia as well as osteopenia of the spine. Discussed treatment options.  Plan - alendronate 70 mg weekly.   Her updated medication list for this problem includes:    Alendronate Sodium 70 Mg Tabs (Alendronate sodium) .Marland Kitchen... 1 by mouth  weekly for bone loss  Complete Medication List: 1)  Fluoxetine Hcl 40 Mg Caps (Fluoxetine hcl) .Marland Kitchen.. 1 by mouth once daily 2)  Multivitamins Tabs (Multiple vitamin) .... Take 1 by mouth qd 3)  Trazodone Hcl 50 Mg Tabs (Trazodone hcl) .... Take 1 by mouth at bedtime 4)  Indomethacin 25 Mg Caps (Indomethacin) .Marland Kitchen.. 1 cap daily 5)  Imitrex 6 Mg/0.42ml Soln (Sumatriptan succinate) .... Subcutaneously injection as needed migraine 6)  Alprazolam 0.5 Mg Tabs (Alprazolam) .Marland Kitchen.. 1 by mouth bid 7)  Valacyclovir Hcl 1 Gm Tabs (Valacyclovir hcl) .Marland Kitchen.. 1 tablet two times a day 8)  Nitrofurantoin Monohyd Macro  100 Mg Caps (Nitrofurantoin monohyd macro) .Marland Kitchen.. 1 capsule two times a day 9)  Maxalt 10 Mg Tabs (Rizatriptan benzoate) .... Once daily as needed migraine 10)  Alendronate Sodium 70 Mg Tabs (Alendronate sodium) .Marland Kitchen.. 1 by mouth weekly for bone loss 11)  Doxycycline Hyclate 100 Mg Caps (Doxycycline hyclate) .Marland Kitchen.. 1 by mouth two times a day for respiratory infection  Patient Instructions: 1)  cough - lungs are clear on exam but fever and production of sputum suggestive of bronchitis vs possible peumonia. Plan - chest x-ray and lab now. Start doxycycline 100mg  two times a day as antibiotic coverage; for cough robitussin DM or equivalent product; tylenol 500-1000mg  three times a day for fever and aches; hydrate; vitamine C 1500mg  daily. 2)  Osteopenia/osteoporosis - sspine and left hip are osteopenic while the right hip is osteoporotic. Plan - calcium 1200mg  daily (diet plus supplement), vitamin D [phone] international units daily, regular weight bearing exercise. Medical therapy with generic fosamax 70mg  once a week. Prescriptions: DOXYCYCLINE HYCLATE 100 MG CAPS (DOXYCYCLINE HYCLATE) 1 by mouth two times a day for respiratory infection  #20 x 0   Entered and Authorized by:   Jacques Navy MD   Signed by:   Jacques Navy MD on 07/08/2010   Method used:   Electronically to        [facility]   [phone]* (retail)       [address]       [place], [place]  [address]       Ph: [phone] or [phone]       Fax: [phone]   RxID:   [id number] ALENDRONATE SODIUM 70 MG TABS (ALENDRONATE SODIUM) 1 by mouth weekly for bone loss  #4 x 12   Entered and Authorized by:   Jacques Navy MD   Signed by:   Jacques Navy MD on 07/08/2010   Method used:   Electronically to        [facility]  [phone]* (retail)       [address]       [place], [place]  [address]       Ph: [phone] or [phone]       Fax: [phone]   RxID:   [id number]    Orders Added: 1)  TLB-CBC Platelet - w/Differential [85025-CBCD] 2)  TLB-BMP (Basic Metabolic Panel-BMET) [80048-METABOL] 3)  T-2 View CXR [71020TC] 4)  Est. Patient Level IV [42706]

## 2010-08-14 NOTE — Assessment & Plan Note (Signed)
Summary: 3 month follow up-lb   Vital Signs:  Patient profile:   65 year old female Height:      62 inches Weight:      123 pounds BMI:     22.58 O2 Sat:      99 % on Room air Temp:     97.9 degrees F oral Pulse rate:   72 / minute BP sitting:   110 / 74  (left arm) Cuff size:   regular  Vitals Entered By: Bill Salinas CMA (April 24, 2010 1:13 PM)  O2 Flow:  Room air CC: pt here for 3 month follow up/ ab   Primary Care Provider:  Sherril Cong  CC:  pt here for 3 month follow up/ ab.  History of Present Illness: Patient presents to discuss Gyn problem: she developed a blood tinged vaginal d/c. She went to her gynecologist and came to endometrial biopsy, which by her report was negative. She continued to have a vaginal d/c along with severe pruritis. 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 She was started on [employer] [facility] [employer] [facility] Thursday 10/6.   She had/has many questions about how she could have contracted HSV with no history of fever blisters or other symptoms, with only two sexual partners. 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 Another factor that confounds the diagnosis is the persistence of her symptoms after 7-8 days of treatment. She continues to have a sense of fullness in her abdomen.  In addition, she needs refill prescriptions and would like to schedule a full exam.    Current Medications (verified): 1)  Fluoxetine Hcl 40 Mg Caps (Fluoxetine Hcl) .Marland Kitchen.. 1 By Mouth Once Daily 2)  Multivitamins  Tabs (Multiple Vitamin) .... Take 1 By Mouth Qd 3)  Trazodone Hcl 50 Mg Tabs (Trazodone Hcl) .... Take 1 By Mouth At Bedtime 4)  Indomethacin 25 Mg Caps (Indomethacin) .Marland Kitchen.. 1 Cap Daily 5)  Imitrex 6 Mg/0.50ml Soln (Sumatriptan Succinate) .... Subcutaneously Injection As Needed Migraine 6)  Alprazolam 0.5 Mg Tabs (Alprazolam) .Marland Kitchen.. 1 By Mouth  Bid 7)  Valacyclovir Hcl 1 Gm Tabs (Valacyclovir Hcl) .Marland Kitchen.. 1 Tablet Two Times A Day 8)  Nitrofurantoin Monohyd Macro 100 Mg Caps (Nitrofurantoin Monohyd Macro) .Marland Kitchen.. 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 Maple Hudson (pediatric opthal)                GI- Dr. Lina Sar                Ortho - Dr. Darrelyn Hillock                ENT - Dr. 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  marriage in good health.   Review of Systems       The patient complains of abdominal pain.  The patient denies anorexia, fever, weight loss, weight gain, peripheral edema, severe indigestion/heartburn, muscle weakness, difficulty walking, and abnormal bleeding.    Physical  Exam  General:  Well-developed,well-nourished,in no acute distress; alert,appropriate and cooperative throughout examination Head:  normocephalic and no abnormalities observed.   Eyes:  C&S clear Abdomen:  soft, non-tender, normal bowel sounds, no masses, no guarding, and no hepatomegaly.   Pulses:  2+ radial Neurologic:  alert & oriented X3, cranial nerves II-XII intact, strength normal in all extremities, and gait normal.   Skin:  turgor normal and color normal.   Psych:  Oriented X3, memory intact for recent and remote, normally interactive, and good eye contact.     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  Plan - lab for HSV I-II serologies          refer to Dr. Henderson Cloud.  Her updated medication list for this problem includes:    Nitrofurantoin Monohyd Macro 100 Mg Caps (Nitrofurantoin monohyd macro) .Marland Kitchen... 1 capsule two times a day  Orders: T- * Misc. Laboratory test [phone]) Gynecologic Referral (Gyn)  Complete Medication List: 1)  Fluoxetine Hcl 40 Mg Caps (Fluoxetine hcl) .Marland Kitchen.. 1 by mouth once daily 2)  Multivitamins Tabs (Multiple vitamin) .... Take 1 by mouth qd 3)  Trazodone Hcl 50 Mg Tabs (Trazodone hcl) .... Take 1 by mouth at bedtime 4)  Indomethacin 25 Mg Caps (Indomethacin) .Marland Kitchen.. 1 cap daily 5)  Imitrex 6 Mg/0.29ml Soln (Sumatriptan succinate) .... Subcutaneously injection as needed migraine 6)  Alprazolam 0.5 Mg Tabs (Alprazolam) .Marland Kitchen.. 1 by mouth bid 7)  Valacyclovir Hcl 1 Gm Tabs (Valacyclovir hcl) .Marland Kitchen.. 1 tablet two times a day 8)  Nitrofurantoin Monohyd Macro 100 Mg Caps (Nitrofurantoin monohyd macro) .Marland Kitchen.. 1 capsule two times a day  Other Orders: TLB-BMP (Basic Metabolic Panel-BMET) (80048-METABOL) TLB-Lipid Panel (80061-LIPID) TLB-Hepatic/Liver Function Pnl (80076-HEPATIC) TLB-A1C / Hgb A1C (Glycohemoglobin) (83036-A1C) TLB-CBC Platelet - w/Differential (85025-CBCD) TLB-TSH (Thyroid Stimulating  Hormone) (84443-TSH) Prescriptions: ALPRAZOLAM 0.5 MG TABS (ALPRAZOLAM) 1 by mouth bid  #60 x 5   Entered and Authorized by:   Jacques Navy MD   Signed by:   Jacques Navy MD on 04/24/2010   Method used:   Print then Give to Patient   RxID:   [id number] IMITREX 6 MG/0.5ML SOLN (SUMATRIPTAN SUCCINATE) Subcutaneously injection as needed migraine  #6 x 3   Entered and Authorized by:   Jacques Navy MD   Signed by:   Jacques Navy MD on 04/24/2010   Method used:   Electronically to        [facility]  [phone]* (retail)       [address]       [place], [place]  [address]  Ph: [phone] or [phone]       Fax: [phone]   RxID:   [id number] INDOMETHACIN 25 MG CAPS (INDOMETHACIN) 1 cap daily  #30 x 3   Entered and Authorized by:   Jacques Navy MD   Signed by:   Jacques Navy MD on 04/24/2010   Method used:   Electronically to        [facility]  [phone]* (retail)       [address]       [place], [place]  [address]       Ph: [phone] or [phone]       Fax: [phone]   RxID:   [id number] TRAZODONE HCL 50 MG TABS (TRAZODONE HCL) take 1 by mouth at bedtime  #30 Each x 12   Entered and Authorized by:   Jacques Navy MD   Signed by:   Jacques Navy MD on 04/24/2010   Method used:   Electronically to        [facility]  [phone]* (retail)       [address]       [place], [place]  [address]       Ph: [phone] or [phone]       Fax: [phone]   RxID:   [id number] FLUOXETINE HCL 40 MG CAPS (FLUOXETINE HCL) 1 by mouth once daily  #30 x 12   Entered and Authorized by:   Jacques Navy MD   Signed by:   Jacques Navy MD on 04/24/2010   Method used:   Electronically to        [facility]  [phone]* (retail)       [address]       [place], [place]  [address]        Ph: [phone] or [phone]       Fax: [phone]   RxID:   [id number]

## 2010-08-14 NOTE — Progress Notes (Signed)
Summary: BP/Patient  BP/Patient   Imported By: Sherian Rein 08/12/2009 10:37:41  _____________________________________________________________________  External Attachment:    Type:   Image     Comment:   External Document

## 2010-08-14 NOTE — Assessment & Plan Note (Signed)
Summary: NEW ENDO CONSULT/ SELF REFERRAL /[employer] [place]/ THINKS SHE HAS P...   Vital Signs:  Patient Profile:   65 Years Old Female Weight:      119.0 pounds Temp:     97.1 degrees F oral Pulse rate:   84 / minute BP sitting:   112 / 76  (left arm) Cuff size:   regular  Pt. in pain?   no  Vitals Entered By: Orlan Leavens (March 16, 2008 8:19 AM)                  PCP:  Sherril Cong  Chief Complaint:  New Endo/ Self referral.  History of Present Illness: pt states few years of cold intolerance, and associated loss of head and body hair.  also chronic constipation and decreased sweating.  also numbness of fingertips, and lightheaded upon arising.    Current Allergies: No known allergies   Past Medical History:    Reviewed history and no changes required:       ALOPECIA (ICD-704.00)       HYPERCHOLESTEROLEMIA (ICD-272.0)       HYPERGLYCEMIA (ICD-790.29)       NUMBNESS (ICD-782.0)       HYPERKALEMIA (ICD-276.7)       UTI'S, HX OF (ICD-V13.00)       CHICKENPOX, HX OF (ICD-V15.9)       COMMON MIGRAINE (ICD-346.10)       HEADACHE (ICD-784.0)       DIVERTICULITIS, HX OF (ICD-V12.79)       DEPRESSION (ICD-311)  Past Surgical History:    Cholecystectomy (1970)   Social History:    married    works a Environmental consultant   Risk Factors:  Tobacco use:  current   Review of Systems       The patient complains of weight gain.  The patient denies vision loss and depression.         denies sob, diarrhea, myalgias, and rhinorrhea.  no change in chronic headache, palpitations, tremor, nxiety, easy bruising, and leg cramps    Physical Exam  General:     well developed, well nourished, in no acute distress Head:     head is normocephalic eyes: no scleral icterus no periorbital swelling perrl external ears are normal nose normal externally mouth has no lesion, including normal tongue  Neck:     no masses, thyromegaly, or abnormal cervical nodes Lungs:     clear to  auscultation.  no respiratory distress  Heart:     regular rate and rhythm, S1, S2 without murmurs, rubs, gallops, or clicks Abdomen:     abdomen is soft, nontender.  no hepatosplenomegaly.   not distended.  no hernia  Msk:     no deformity or scoliosis noted with normal posture and gait Extremities:     no deformity.  no ulcer on the feet.  feet are of normal color and temp.  no edema  Neurologic:     sensation is intact to touch on the feet cn 2-12 grossly intact.   readily moves all 4's.    Skin:     intact without lesions or rashes.  little or no body hair Cervical Nodes:     no significant adenopathy Psych:     alert and cooperative; normal mood and affect; normal attention span and concentration Additional Exam:     outside test results are reviewed:  09/23/06: k+=6.2 tsh=0.895 a1c=5.9  03/16/08: VITAMIN B12  564 pg/mL                   211-911   FOLATE                    10.3 ng/mL      Tests: (2) BASIC METABOLIC PANEL (METABOL)   SODIUM                    142 mEq/L                   135-145   POTASSIUM                 4.2 mEq/L                   3.5-5.1   CHLORIDE                  107 mEq/L                   96-112   CARBON DIOXIDE            31 mEq/L                    19-32   GLUCOSE              [H]  102 mg/dL                   95-62   BUN                       18 mg/dL                    1-30   CREATININE                1.0 mg/dL                   8.6-5.7   CALCIUM                   9.6 mg/dL                   8.4-69.6    CORTISOL                  18.7 (baseline), then increases to 27, 45 minutes after 250 micrograms cosyntropin im    Impression & Recommendations:  Problem # 1:  ALOPECIA (ICD-704.00)  Problem # 2:  NUMBNESS (ICD-782.0)  Problem # 3:  cold intolerance  Problem # 4:  HYPERGLYCEMIA (ICD-790.29)  Medications Added to Medication List This Visit: 1)  Alendronate Sodium 70 Mg Tabs (Alendronate sodium) .... Take 1 q week 2)   Fluoxetine Hcl 20 Mg Tabs (Fluoxetine hcl) .... Take 1 by mouth qd 3)  Multivitamins Tabs (Multiple vitamin) .... Take 1 by mouth qd 4)  Alprazolam 0.5 Mg Tabs (Alprazolam) .... Take 1 at bedtime 5)  Trazodone Hcl 50 Mg Tabs (Trazodone hcl) .... Take 1 by mouth at bedtime 6)  Estring 2 Mg Ring (Estradiol) .... Insert once q 3 months  Other Orders: TLB-Cortisol (82533-CORT) Cosyntropin Inj. (E9528) Admin of Therapeutic Inj  intramuscular or subcutaneous (41324) TLB-BMP (Basic Metabolic Panel-BMET) (80048-METABOL)   Patient Instructions: 1)  i advised pt that no endocrine cause for her sxs is found 2)  continue routine f/u with pcp's, including annual tsh and a1c 3)  ret here  as needed 4)  cc p [facility] or dr Leda Quail and dr Corliss Blacker   ]  Medication Administration  Injection # 1:    Medication: Cosyntropin Inj.    Diagnosis: HYPERKALEMIA (ICD-276.7)    Route: IM    Site: L deltoid    Exp Date: 09/2009    Lot #: [id number]    Mfr: amphastar    Patient tolerated injection without complications    Given by: Orlan Leavens (March 16, 2008 10:12 AM)  Orders Added: 1)  TLB-Cortisol [82533-CORT] 2)  Cosyntropin Inj. [J0835] 3)  Admin of Therapeutic Inj  intramuscular or subcutaneous [96372] 4)  TLB-B12 + Folate Pnl [82746_82607-B12/FOL] 5)  TLB-BMP (Basic Metabolic Panel-BMET) [80048-METABOL] 6)  New Patient Level IV [45409]

## 2010-08-14 NOTE — Progress Notes (Signed)
Summary: Refill--Trazodone/Alprazolam  Phone Note Refill Request   Refills Requested: Medication #1:  TRAZODONE HCL 50 MG TABS take 1 by mouth at bedtime  Medication #2:  ALPRAZOLAM 0.5 MG TABS 1 by mouth bid. [facility]  Initial call taken by: Lamar Sprinkles, CMA,  January 14, 2010 10:16 AM  Follow-up for Phone Call        ok 3 ref on both Follow-up by: Tresa Garter MD,  January 15, 2010 8:09 AM    Prescriptions: ALPRAZOLAM 0.5 MG TABS (ALPRAZOLAM) 1 by mouth bid  #60 x 3   Entered by:   Lucious Groves   Authorized by:   Tresa Garter MD   Signed by:   Lucious Groves on 01/15/2010   Method used:   Telephoned to ...       [facility] [address]  [phone]* (retail)       [address]       [place], [place]  [address]       Ph: [phone] or [phone]       Fax: [phone]   RxID:   [id number] TRAZODONE HCL 50 MG TABS (TRAZODONE HCL) take 1 by mouth at bedtime  #30 Each x 3   Entered by:   Lucious Groves   Authorized by:   Tresa Garter MD   Signed by:   Lucious Groves on 01/15/2010   Method used:   Telephoned to ...       [facility] [address]  [phone]* (retail)       [address]       [place], [place]  [address]       Ph: [phone] or [phone]       Fax: [phone]   RxID:   [id number]

## 2010-08-14 NOTE — Progress Notes (Signed)
Summary: ALT MED?   Phone Note Call from Patient   Summary of Call: Pt has been on doxycycline and continues to have a fever. She is concerned that abx is not working and wants to know if she needs alt antibiotic?  Initial call taken by: Lamar Sprinkles, CMA,  July 11, 2010 10:50 AM  Follow-up for Phone Call        at three days I recommend continueing with the doxycycline especially with a ormal CXR and minimally elevated WBC at 11,000 Follow-up by: Jacques Navy MD,  July 11, 2010 1:39 PM  Additional Follow-up for Phone Call Additional follow up Details #1::        Pt informed  Additional Follow-up by: Lamar Sprinkles, CMA,  July 11, 2010 4:29 PM

## 2010-08-14 NOTE — Progress Notes (Signed)
Summary: Questions  Phone Note Call from Patient Call back at Home Phone [phone] Call back at [facility] VM ON HOME #   Summary of Call: 1. Should another dr refill her meds or will Dr Debby Bud do refills for xanax? 2. BP continues to be elevated. She has checked bp daily. Average she says is 137.  3. Pt says dr told her that cholesterol report was elevated and he would get back to her.  Initial call taken by: Lamar Sprinkles, CMA,  July 24, 2009 3:14 PM  Follow-up for Phone Call        1. will refill her xanax as needed. If she needs a refill now that is OK - alprazolam 0.5 mg # 30 1 at bedtime, refill x 5 2. For persistent elevation in BP can see in the office to address 3. Have not received her recent lab work and per my note will make recommendations after reviewing cholesterol levels ( last data available to me is from '06) Follow-up by: Jacques Navy MD,  July 24, 2009 4:15 PM  Additional Follow-up for Phone Call Additional follow up Details #1::        1.Pt does not need refill at this time, she will call when needed.  2.Pt will drop off bp readings for Dr's review 3. Pt will call her other MD to see if they have done labs. 4. Please add anxiety disorder to problem list 5. [facility] Need to add imitrex injection 6mg  as needed to her med list. Dr Landry Mellow is her neighbor and pt calls him when needed. H/a's have decreased since getting the anxiety disorder.    Additional Follow-up by: Lamar Sprinkles, CMA,  July 25, 2009 10:39 AM  New Problems: ANXIETY DEPRESSION (ICD-300.4)   New Problems: ANXIETY DEPRESSION (ICD-300.4) New/Updated Medications: IMITREX 6 MG/0.5ML SOLN (SUMATRIPTAN SUCCINATE) Subcutaneously injection as needed migraine

## 2010-08-14 NOTE — Progress Notes (Signed)
Summary: F/U OV   Phone Note Call from Patient   Summary of Call: Pt stated she dropped off BP readings (on counter) and stated the last cholesterol check was in 2009. Does pt need to come in for appt? Please advise? Initial call taken by: Josph Macho CMA,  August 01, 2009 2:35 PM  Follow-up for Phone Call        Reviewed  BP's - not too bad. I think that due to her concerns a follow-up office visit would be appropriate.  Follow-up by: Jacques Navy MD,  August 05, 2009 9:27 AM  Additional Follow-up for Phone Call Additional follow up Details #1::        Pt informed, scheduled for office visit this week  Additional Follow-up by: Lamar Sprinkles, CMA,  August 05, 2009 5:28 PM

## 2010-08-14 NOTE — Progress Notes (Signed)
Summary: REFILLS   Phone Note Refill Request   Refills Requested: Medication #1:  DICYCLOMINE HCL 10 MG CAPS 1 by mouth three times a day; may titrate to symptoms.  Medication #2:  ALPRAZOLAM 0.5mg  1 bid   Notes: #60 Pt wants to know if she is due for office visit?   Initial call taken by: Lamar Sprinkles, CMA,  November 04, 2009 2:39 PM  Follow-up for Phone Call        OKI for refills. Ok for follow-up OV Follow-up by: Jacques Navy MD,  November 04, 2009 5:40 PM  Additional Follow-up for Phone Call Additional follow up Details #1::        lmoam for pt to call back Additional Follow-up by: Ami Bullins CMA,  November 06, 2009 8:54 AM    Additional Follow-up for Phone Call Additional follow up Details #2::    pt made f/u ov for 5/3@11 :10 am. Follow-up by: Verdell Face,  November 06, 2009 11:38 AM  New/Updated Medications: ALPRAZOLAM 0.5 MG TABS (ALPRAZOLAM) 1 by mouth bid Prescriptions: ALPRAZOLAM 0.5 MG TABS (ALPRAZOLAM) 1 by mouth bid  #60 x 0   Entered by:   Lucious Groves   Authorized by:   Jacques Navy MD   Signed by:   Lucious Groves on 11/06/2009   Method used:   Telephoned to ...       [facility]  [address]  [phone]* (retail)       [address]       [place], [place]  [address]       Ph: [phone] or [phone]       Fax: [phone]   RxID:   [id number] DICYCLOMINE HCL 10 MG CAPS (DICYCLOMINE HCL) 1 by mouth three times a day; may titrate to symptoms  #30 x 0   Entered by:   Lucious Groves   Authorized by:   Jacques Navy MD   Signed by:   Lucious Groves on 11/06/2009   Method used:   Telephoned to ...       [facility]  [address]  [phone]* (retail)       [address]       [place], [place]  [address]       Ph: [phone] or [phone]       Fax: [phone]   RxID:   [id number]     Allergies: No Known Drug Allergies

## 2010-08-14 NOTE — Assessment & Plan Note (Signed)
Summary: req f/u ov/#/cd   Vital Signs:  Patient profile:   65 year old female Height:      62 inches Weight:      121 pounds BMI:     22.21 O2 Sat:      97 % on Room air Temp:     97.8 degrees F oral Pulse rate:   78 / minute BP sitting:   110 / 80  (left arm) Cuff size:   regular  Vitals Entered By: Bill Salinas CMA (Nov 12, 2009 11:12 AM)  O2 Flow:  Room air CC: follow-up visit, pt c/o sore throat with nasal drainage/ ab   Primary Care Provider:  Sherril Cong  CC:  follow-up visit and pt c/o sore throat with nasal drainage/ ab.  History of Present Illness: Patient presents for sinus congestion, sore throat and nasal drainage. She is chronically hoarse. She did see Dr. Haroldine Laws last week and was given an antibiotic-maybe Ceftin- x 5 days. Her symptoms have persisted. Over the w/e she did feel like she had a fever, undocumented, and chills.  She otherwise reports that she is doing OK with no other complaints. She reports that the bentyl has been very helpful.   Current Medications (verified): 1)  Fluoxetine Hcl 40 Mg Caps (Fluoxetine Hcl) .Marland Kitchen.. 1 By Mouth Once Daily 2)  Multivitamins  Tabs (Multiple Vitamin) .... Take 1 By Mouth Qd 3)  Trazodone Hcl 50 Mg Tabs (Trazodone Hcl) .... Take 1 By Mouth At Bedtime 4)  Indomethacin 25 Mg Caps (Indomethacin) .Marland Kitchen.. 1 Cap Daily 5)  Imitrex 6 Mg/0.84ml Soln (Sumatriptan Succinate) .... Subcutaneously Injection As Needed Migraine 6)  Alprazolam 0.5 Mg Tabs (Alprazolam) .Marland Kitchen.. 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 Maple Hudson (pediatric opthal)                GI- Dr. Lina Sar                Ortho - Dr. Darrelyn Hillock                ENT - Dr. Haroldine Laws  Past Surgical History: Reviewed history from 07/19/2009 and no changes required. 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 Father - deceased @ 71: CHF, CAD Mother - deceased @85 : CAD, CHF, HTN , Lipid Neg- Breast or colon cancer Sister- DM with all the complications, CAD, PVD, CVA  Social History: Reviewed history from 07/19/2009 and no changes required. 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  marriage in good health.   Review of Systems       The patient complains  of fever and hoarseness.  The patient denies anorexia, weight loss, weight gain, chest pain, syncope, dyspnea on exertion, peripheral edema, prolonged cough, hemoptysis, melena, severe indigestion/heartburn, incontinence, muscle weakness, difficulty walking, depression, unusual weight change, abnormal bleeding, and angioedema.    Physical Exam  General:  Well-developed,well-nourished,in no acute distress; alert,appropriate and cooperative throughout examination Head:  [place]/AT, no sinus tenderness to percussion Eyes:  corneas and lenses clear and no injection.   Ears:  TMs pearly with good landmarks Nose:  no external deformity and no external erythema.   Mouth:  posterior pharynx clear without erythema or exudate. Neck:  full ROM and no thyromegaly.   Lungs:   normal respiratory effort, normal breath sounds, and no wheezes.   Heart:  normal rate and regular rhythm.   Skin:  turgor normal and color normal.   Cervical Nodes:  no anterior cervical adenopathy and no posterior cervical adenopathy.   Psych:  Oriented X3, normally interactive, and good eye contact.     Impression & Recommendations:  Problem # 1:  ALLERGIC RHINITIS CAUSE UNSPECIFIED (ICD-477.9) No evidence of persistent infection on exam. Suspect her symptoms are allergy related.  Plan - otc generic claritin and possible mucinex if needed  Problem # 2:  IRRITABLE BOWEL SYNDROME (ICD-564.1) Very positive response to bentyl - marked reduction in symptoms  Plan - continue with bentyl up to three times a day as needed: she may titrate dose to symptom severity.   Complete Medication List: 1)  Fluoxetine Hcl 40 Mg Caps (Fluoxetine hcl) .Marland Kitchen.. 1 by mouth once daily 2)  Multivitamins Tabs (Multiple vitamin) .... Take 1 by mouth qd 3)  Trazodone Hcl 50 Mg Tabs (Trazodone hcl) .... Take 1 by mouth at bedtime 4)  Indomethacin 25 Mg Caps (Indomethacin) .Marland Kitchen.. 1 cap daily 5)  Imitrex 6 Mg/0.82ml Soln (Sumatriptan succinate) .... Subcutaneously injection as needed migraine 6)  Alprazolam 0.5 Mg Tabs (Alprazolam) .Marland Kitchen.. 1 by mouth bid

## 2010-08-14 NOTE — Progress Notes (Signed)
SummaryPrudy Mcpherson  Phone Note Call from Patient   Summary of Call: Patient is requesting rx for xanax to be 1 to 2 daily. Occasionally she needs 2 daily and would like qty to be changed also. OK?  Initial call taken by: Lamar Sprinkles, CMA,  September 03, 2009 4:07 PM  Follow-up for Phone Call        ok to make change Follow-up by: Jacques Navy MD,  September 03, 2009 6:06 PM  Additional Follow-up for Phone Call Additional follow up Details #1::        left message to call back to office, patient refused to come to the phone. Additional Follow-up by: Lucious Groves,  September 04, 2009 3:11 PM    Additional Follow-up for Phone Call Additional follow up Details #2::    rtn call-left message on machine to call back to office. Follow-up by: Lucious Groves,  September 04, 2009 4:27 PM  Additional Follow-up for Phone Call Additional follow up Details #3:: Details for Additional Follow-up Action Taken: Patient notified and prescription called in. Additional Follow-up by: Lucious Groves,  September 04, 2009 4:48 PM  New/Updated Medications: ALPRAZOLAM 0.5 MG TABS (ALPRAZOLAM) take 1 tab by mouth two times a day as directed Prescriptions: ALPRAZOLAM 0.5 MG TABS (ALPRAZOLAM) take 1 tab by mouth two times a day as directed  #60 x 0   Entered by:   Lucious Groves   Authorized by:   Jacques Navy MD   Signed by:   Lucious Groves on 09/04/2009   Method used:   Telephoned to ...       [facility]  [address]  [phone]* (retail)       [address]       [place], [place]  [address]       Ph: [phone] or [phone]       Fax: [phone]   RxID:   [id number]

## 2010-08-14 NOTE — Miscellaneous (Signed)
Summary: BONE DENSITY  Clinical Lists Changes  Orders: Added new Test order of T-Bone Densitometry (77080) - Signed Added new Test order of T-Lumbar Vertebral Assessment (77082) - Signed 

## 2010-08-14 NOTE — Letter (Signed)
   [facility] [address] [place], [place]  [address] Phone: [phone]      June 30, 2010   [facility] [address] [place], [place] [address]  RE:  LAB RESULTS  Dear  Ms. Winzer,  The following is an interpretation of your most recent lab tests.  Please take note of any instructions provided or changes to medications that have resulted from your lab work.    Bone density study reveals a T-score of -2.1 at spine - osteopenia; -2.4 right hip, -2.6 left hip - osteoporosis range.   Based on the bone density results I recommend medical therapy. Please schedule an appointment to discuss treatment options.    Sincerely Yours,    Jacques Navy MD

## 2010-08-14 NOTE — Progress Notes (Signed)
Summary: CALL?   Phone Note Call from Patient   Summary of Call: Pt left vm stating that MD was going to discuss something with Dr Henderson Cloud. Please advise. Initial call taken by: Lamar Sprinkles, CMA,  April 25, 2010 2:33 PM  Follow-up for Phone Call        called pt and informed her of appt next Tuesday with Dr. Henderson Cloud Follow-up by: Jacques Navy MD,  April 25, 2010 6:02 PM

## 2010-08-14 NOTE — Progress Notes (Signed)
Summary: TRIAGE   Phone Note Call from Patient Call back at Home Phone [phone]   Call For: Dr Juanda Chance Reason for Call: Talk to Nurse Summary of Call: C/O one week of diarrhea for 2 or 3 days, the next  week she will be constipated. Has been going on for a while now and just doesnt know what to treat with anymore. Wants an appoinment to see MD, but next available is in June. Initial call taken by: Leanor Kail [facility],  November 08, 2008 3:59 PM  Follow-up for Phone Call        Pt. c/o bowel habit changes, she is either constipated or has diarrhea. She has added fiber to her diet, tried a fiber supplement but no help. States she is scared to leave the house due to stool incontinence. Denies blood,black stools,pain,n/v,fever.  1) See Dr.Icess Bertoni on 11-22-08 at 3pm 2) Restart a daily fiber supplement, take it daily until your OV. 3) Ample daily fluids 4) Immodium as needed for diarrhea. 5) Pt. instructed to call back as needed.   Follow-up by: Laureen Ochs LPN,  November 08, 2008 4:22 PM  Additional Follow-up for Phone Call Additional follow up Details #1::        no GI history in EMR, agree with triage disposition. Additional Follow-up by: Hart Carwin,  November 09, 2008 6:04 PM

## 2010-08-14 NOTE — Assessment & Plan Note (Signed)
Summary: CPX/ NWS  #   Vital Signs:  Patient profile:   65 year old female Height:      63 inches Weight:      124 pounds O2 Sat:      98 % on Room air Temp:     98.2 degrees F oral Pulse rate:   77 / minute Resp:     12 per minute BP sitting:   138 / 82  O2 Flow:  Room air  Primary Care Provider:  Sherril Cong   History of Present Illness: Patient presents for follow-up. since her last visit she has seen Dr. 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 She did have a full pelvic/pap and breast exam.  she had lab Octboer 14th- all reviewed and all normal.  she is due for mammography and bone density which will be set up for her. She is current with colorectal cancer screening.   Immunizations are brought up to date and she will need a tetnus booster today.   Allergies: No Known Drug Allergies  Past History:  Past Medical History: VULVOVAGINITIS (ICD-616.10) IRRITABLE BOWEL SYNDROME (ICD-564.1) ALLERGIC RHINITIS CAUSE UNSPECIFIED (ICD-477.9) ELEVATED BP READING WITHOUT DX HYPERTENSION (ICD-796.2) OTHER CHRONIC SINUSITIS (ICD-473.8) NECK PAIN, CHRONIC (ICD-723.1) LOW BACK PAIN, CHRONIC (ICD-724.2) ALOPECIA (ICD-704.00) HYPERCHOLESTEROLEMIA (ICD-272.0) HYPERGLYCEMIA (ICD-790.29) HYPERKALEMIA (ICD-276.7) UTI'S, HX OF (ICD-V13.00) CHICKENPOX, HX OF (ICD-V15.9) COMMON MIGRAINE (ICD-346.10) HEADACHE (ICD-784.0) DIVERTICULITIS, HX OF (ICD-V12.79) ANXIETY DEPRESSION (ICD-300.4)          Physician Roster;                Gyn - Rock Nephew, NP                ophtal - Dr. Maple Hudson (pediatric opthal)                GI- Dr. Lina Sar                Ortho - Dr. Darrelyn Hillock                ENT - Dr. 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 Father - deceased @ 34: CHF, CAD Mother - deceased @85 : CAD, CHF, HTN , Lipid Neg- Breast or colon cancer Sister- DM with all the complications, CAD, PVD, CVA - 36  Social History: Reviewed history from 07/19/2009 and no changes required. 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  marriage in good health.   Review of Systems  The patient denies anorexia, fever, weight loss, weight gain, decreased hearing, chest pain, dyspnea on exertion, prolonged cough, headaches, hemoptysis, abdominal pain, melena, incontinence, transient blindness, depression, abnormal bleeding, and angioedema.    Physical Exam  General:  Well-developed,well-nourished,in no acute distress; alert,appropriate and cooperative throughout examination Head:  normocephalic and atraumatic.   Eyes:  pupils equal, pupils round, corneas and lenses clear, and no injection.   Neck:  supple.   Lungs:  Normal respiratory effort, chest expands symmetrically. Lungs are clear to auscultation, no crackles or wheezes. Heart:  normal rate, regular rhythm, and no murmur.   Abdomen:  soft, non-tender, normal bowel sounds, no distention, no guarding, no rigidity, and no hepatomegaly.   Msk:  normal ROM, no joint tenderness, no joint swelling, no joint warmth, and no redness over joints.   Pulses:  2+ radial Neurologic:  alert & oriented X3, cranial nerves II-XII intact, and gait normal.   Skin:  turgor normal, color normal, and no rashes.   Cervical Nodes:  no anterior cervical adenopathy and no posterior cervical adenopathy.   Psych:  Oriented X3, normally interactive, good eye contact, and moderately anxious.     Impression & Recommendations:  Problem # 1:  IRRITABLE BOWEL SYNDROME (ICD-564.1) Patient with a lot of stress in  the home: step-daughter with issues. This may be an exaceerbating factor in her symptoms  Plan - no change in medications           recommended resuming counseling/therapy to find better solutions.  Problem # 2:  VULVOVAGINITIS (ICD-616.10) Resolving vaginitis. Patient very satisfied with care from Dr. Henderson Cloud.  Her updated medication list for this problem includes:    Nitrofurantoin Monohyd Macro 100 Mg Caps (Nitrofurantoin monohyd macro) .Marland Kitchen... 1 capsule two times a day  Problem # 3:  HYPERKALEMIA (ICD-276.7) Stable labs recently. No need for further evaluation  Problem # 4:  ELEVATED BP READING WITHOUT DX HYPERTENSION (ICD-796.2)  BP today: 138/82 Prior BP: 110/74 (04/24/2010)  Labs Reviewed: Creat: 1.0 (04/24/2010) Chol: 189 (04/24/2010)   HDL: 61.30 (04/24/2010)   LDL: 115 (04/24/2010)   TG: 66.0 (04/24/2010)  BP mildly elevated. No indeication to start medication at this time - see #1.  Problem # 5:  Preventive Health Care (ICD-V70.0) unremarkable interval hisotry since her last visit. Current with Gyn. Last colonoscopy  Dec '08. Immunizations: flu - Oct  13th, '11; pneumonvax May '09. Last mammogram Dec '08 - due for follow-up. Patient says she will schedule. Recent labs OK. 12 Lead EKG negative for any ischemia or injury pattern.   Complete Medication List: 1)  Fluoxetine Hcl 40 Mg Caps (Fluoxetine hcl) .Marland Kitchen.. 1 by mouth once daily 2)  Multivitamins Tabs (Multiple vitamin) .... Take 1 by mouth qd 3)  Trazodone Hcl 50 Mg Tabs (Trazodone hcl) .... Take 1 by mouth at bedtime 4)  Indomethacin 25 Mg Caps (Indomethacin) .Marland Kitchen.. 1 cap daily 5)  Imitrex 6 Mg/0.3ml Soln (Sumatriptan succinate) .... Subcutaneously injection as needed migraine 6)  Alprazolam 0.5 Mg Tabs (Alprazolam) .Marland Kitchen.. 1 by mouth bid 7)  Valacyclovir Hcl 1 Gm Tabs (Valacyclovir hcl) .Marland Kitchen.. 1 tablet two times a day 8)  Nitrofurantoin Monohyd Macro 100 Mg Caps (Nitrofurantoin monohyd macro) .Marland Kitchen.. 1 capsule two times a day 9)   Maxalt 10 Mg Tabs (Rizatriptan benzoate) .... Once daily as needed migraine   Orders Added: 1)  Est. Patient Level III [78295]   Immunization History:  Influenza Immunization History:    Influenza:  fluvax 3+ (04/24/2010)  Pneumovax Immunization History:    Pneumovax:  pneumovax (11/23/2007)   Immunization History:  Influenza Immunization History:    Influenza:  Fluvax 3+ (04/24/2010)  Pneumovax Immunization History:    Pneumovax:  Pneumovax (11/23/2007)   Preventive Care Screening  Mammogram:    Date:  06/22/2007    Results:  normal

## 2010-08-14 NOTE — Progress Notes (Signed)
  Phone Note Refill Request Message from:  Fax from Pharmacy on September 03, 2009 12:12 PM  Refills Requested: Medication #1:  TRAZODONE HCL 50 MG TABS take 1 by mouth at bedtime   Last Refilled: 08/06/2009 Pt was a New Primary Pt to you on 07/19/2009. Refill Request coming from [facility] on [address] . Please Advise Refill  Initial call taken by: Ami Bullins CMA,  September 03, 2009 12:13 PM  Follow-up for Phone Call        ok for refills as needed  Follow-up by: Jacques Navy MD,  September 03, 2009 5:48 PM    Prescriptions: TRAZODONE HCL 50 MG TABS (TRAZODONE HCL) take 1 by mouth at bedtime  #30 x 0   Entered by:   Ami Bullins CMA   Authorized by:   Jacques Navy MD   Signed by:   Bill Salinas CMA on 09/04/2009   Method used:   Faxed to ...       [facility]  [address]  [phone]* (retail)       [address]       [place], [place]  [address]       Ph: [phone] or [phone]       Fax: [phone]   RxID:   [id number]

## 2010-08-14 NOTE — Progress Notes (Signed)
Summary: REFILL - Trazodone  Phone Note Refill Request Message from:  Pharmacy  Refills Requested: Medication #1:  TRAZODONE HCL 50 MG TABS take 1 by mouth at bedtime Initial call taken by: Lamar Sprinkles, CMA,  November 04, 2009 7:54 AM  Follow-up for Phone Call        ok to refill with 5 add'l Follow-up by: Jacques Navy MD,  November 04, 2009 9:12 AM    Prescriptions: TRAZODONE HCL 50 MG TABS (TRAZODONE HCL) take 1 by mouth at bedtime  #30 x 5   Entered by:   Lamar Sprinkles, CMA   Authorized by:   Jacques Navy MD   Signed by:   Lamar Sprinkles, CMA on 11/04/2009   Method used:   Telephoned to ...       [facility]  [address]  [phone]* (retail)       [address]       [place], [place]  [address]       Ph: [phone] or [phone]       Fax: [phone]   RxID:   [id number]

## 2010-09-12 ENCOUNTER — Encounter: Payer: Self-pay | Admitting: Internal Medicine

## 2010-09-17 ENCOUNTER — Telehealth: Payer: Self-pay | Admitting: Internal Medicine

## 2010-09-22 ENCOUNTER — Ambulatory Visit (INDEPENDENT_AMBULATORY_CARE_PROVIDER_SITE_OTHER): Payer: BC Managed Care – PPO | Admitting: Internal Medicine

## 2010-09-22 ENCOUNTER — Encounter: Payer: Self-pay | Admitting: Internal Medicine

## 2010-09-22 DIAGNOSIS — L659 Nonscarring hair loss, unspecified: Secondary | ICD-10-CM

## 2010-09-23 NOTE — Letter (Signed)
Summary: [facility]  [facility]   Imported By: Lennie Odor 09/19/2010 10:37:54  _____________________________________________________________________  External Attachment:    Type:   Image     Comment:   External Document

## 2010-09-30 NOTE — Assessment & Plan Note (Signed)
Summary: SHAKY--FEEL FREEZING COLD--X LAST YR---STC   Vital Signs:  Patient profile:   65 year old female Height:      63 inches Weight:      122 pounds BMI:     21.69 O2 Sat:      97 % on Room air Temp:     98.0 degrees F oral Pulse rate:   73 / minute BP sitting:   128 / 80  (left arm) Cuff size:   regular  Vitals Entered By: Bill Salinas CMA (September 22, 2010 1:14 PM)  O2 Flow:  Room air CC: pt here for evaluation of ongoing symptoms of feeling "shakey" and being cold all the time./ ab Comments Pt has started several new meds she is unsure of name and dose. She will call to update.   Primary Care Provider:  Jacques Navy MD  CC:  pt here for evaluation of ongoing symptoms of feeling "shakey" and being cold all the time./ ab.  History of Present Illness: Mrs. [name] continues to have a complaint of being cold all the time. She has also lost all of her pubic hair. she is generally low on energy and has a shaky feeling much of the time. She has had thyroid function tested in 2011 x 2 and was normal with TSH 0.65. chemistry panels have been normal. She has had negative lab for celiac disease. Her CBCs have been normal. Her symptoms are progressive.   She has seen Dr. Ethelene Hal for neck and back pain. He has been treating her for this problem in the past. She has had ESI that made her shaky and were not all that effective. She has been started on cymbalta 50mg  once daily.   Current Medications (verified): 1)  Fluoxetine Hcl 40 Mg Caps (Fluoxetine Hcl) .Marland Kitchen.. 1 By Mouth Once Daily 2)  Multivitamins  Tabs (Multiple Vitamin) .... Take 1 By Mouth Qd 3)  Trazodone Hcl 50 Mg Tabs (Trazodone Hcl) .... Take 1 By Mouth At Bedtime 4)  Indomethacin 25 Mg Caps (Indomethacin) .Marland Kitchen.. 1 Cap Daily 5)  Imitrex 6 Mg/0.57ml Soln (Sumatriptan Succinate) .... Subcutaneously Injection As Needed Migraine 6)  Alprazolam 0.5 Mg Tabs (Alprazolam) .Marland Kitchen.. 1 By Mouth Bid 7)  Valacyclovir Hcl 1 Gm Tabs (Valacyclovir Hcl)  .Marland Kitchen.. 1 Tablet Two Times A Day 8)  Nitrofurantoin Monohyd Macro 100 Mg Caps (Nitrofurantoin Monohyd Macro) .Marland Kitchen.. 1 Capsule Two Times A Day 9)  Maxalt 10 Mg  Tabs (Rizatriptan Benzoate) .... Once Daily As Needed Migraine 10)  Alendronate Sodium 70 Mg Tabs (Alendronate Sodium) .Marland Kitchen.. 1 By Mouth Weekly For Bone Loss 11)  Doxycycline Hyclate 100 Mg Caps (Doxycycline Hyclate) .Marland Kitchen.. 1 By Mouth Two Times A Day For Respiratory Infection 12)  Estrace 0.1 Mg/gm Crea (Estradiol)  Allergies (verified): No Known Drug Allergies  Past History:  Past Medical History: Last updated: 06/03/2010 VULVOVAGINITIS (ICD-616.10) IRRITABLE BOWEL SYNDROME (ICD-564.1) ALLERGIC RHINITIS CAUSE UNSPECIFIED (ICD-477.9) ELEVATED BP READING WITHOUT DX HYPERTENSION (ICD-796.2) OTHER CHRONIC SINUSITIS (ICD-473.8) NECK PAIN, CHRONIC (ICD-723.1) LOW BACK PAIN, CHRONIC (ICD-724.2) ALOPECIA (ICD-704.00) HYPERCHOLESTEROLEMIA (ICD-272.0) HYPERGLYCEMIA (ICD-790.29) HYPERKALEMIA (ICD-276.7) UTI'S, HX OF (ICD-V13.00) CHICKENPOX, HX OF (ICD-V15.9) COMMON MIGRAINE (ICD-346.10) HEADACHE (ICD-784.0) DIVERTICULITIS, HX OF (ICD-V12.79) ANXIETY DEPRESSION (ICD-300.4)          Physician Roster;                Gyn - Rock Nephew, NP                ophtal - Dr.  Young (pediatric opthal)                GI- Dr. Lina Sar                Ortho - Dr. Darrelyn Hillock                ENT - Dr. Haroldine Laws  Past Surgical History: Last updated: 2010/06/12 Cholecystectomy (1970) Partial colectomy in her 20's correction of esotropic June '06, May '10  P2G2  Family History: Last updated: 2010/06/12 Father - deceased @ 56: CHF, CAD Mother - deceased @85 : CAD, CHF, HTN , Lipid Neg- Breast or colon cancer Sister- DM with all the complications, CAD, PVD, CVA - 45  Social History: Last updated: June 12, 2010 [place] '72 - 16 yrs/divorce; married '92 - 2 sons - '77, '82; 1 grandchild Niece (jamie - Madeline's daughter, with 3  children and full time Consulting civil engineer) works as a Engineer, 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  marriage in good health.   Review of Systems  The patient denies anorexia, fever, weight loss, weight gain, decreased hearing, chest pain, dyspnea on exertion, prolonged cough, headaches, abdominal pain, hematochezia, incontinence, muscle weakness, difficulty walking, abnormal bleeding, and enlarged lymph nodes.         loss of all pubic hair, thinning of hair at scalp.  Physical Exam  General:  Well-developed,well-nourished,in no acute distress; alert,appropriate and cooperative throughout examination Head:  normocephalic and atraumatic.   Eyes:  vision grossly intact, pupils equal, pupils round, corneas and lenses clear, and no injection.   Neck:  supple.   Lungs:  normal respiratory effort.   Heart:  normal rate and regular rhythm.   Msk:  normal ROM.   Pulses:  2+ radial Neurologic:  alert & oriented X3, cranial nerves II-XII intact, and gait normal.   Skin:  color normal.   Psych:  Oriented X3, memory intact for recent and remote, normally interactive, and good eye contact.     Impression & Recommendations:  Problem # 1:  ALOPECIA (ICD-704.00) Patient with alopecia as well as loss of all pubic hair along with thermo-regulation issues.  Plan - refer to Dr. Leslie Dales for evaluation endocrine systems.  Orders: Endocrinology Referral (Endocrine)  Problem # 2:  LOW BACK PAIN, CHRONIC (ICD-724.2) Seeing Dr. Ethelene Hal and is trying Cymbalta without real improvement todate. She is to see Dr. Ethelene Hal in early April  Her updated medication list for this problem includes:    Indomethacin 25 Mg Caps (Indomethacin) .Marland Kitchen... 1 cap daily  Complete Medication List: 1)  Multivitamins Tabs (Multiple vitamin) .... Take 1 by mouth qd 2)  Trazodone Hcl 50 Mg Tabs (Trazodone hcl) .... Take 1 by mouth at bedtime 3)  Indomethacin 25 Mg Caps (Indomethacin) .Marland Kitchen.. 1 cap daily 4)  Imitrex 6 Mg/0.43ml Soln (Sumatriptan  succinate) .... Subcutaneously injection as needed migraine 5)  Alprazolam 0.5 Mg Tabs (Alprazolam) .Marland Kitchen.. 1 by mouth bid 6)  Valacyclovir Hcl 1 Gm Tabs (Valacyclovir hcl) .Marland Kitchen.. 1 tablet two times a day 7)  Nitrofurantoin Monohyd Macro 100 Mg Caps (Nitrofurantoin monohyd macro) .Marland Kitchen.. 1 capsule two times a day 8)  Maxalt 10 Mg Tabs (Rizatriptan benzoate) .... Once daily as needed migraine 9)  Alendronate Sodium 70 Mg Tabs (Alendronate sodium) .Marland Kitchen.. 1 by mouth weekly for bone loss 10)  Doxycycline Hyclate 100 Mg Caps (Doxycycline hyclate) .Marland Kitchen.. 1 by mouth two times a day for respiratory infection 11)  Estrace 0.1 Mg/gm Crea (Estradiol) 12)  Cymbalta 60 Mg Cpep (Duloxetine hcl) .Marland KitchenMarland KitchenMarland Kitchen  1 by mouth once daily  Patient Instructions: 1)  Thermoregulation problems and hair loss all suggestive of underlying endocrine abnormality. Plan - refer to Dr. Casimiro Needle Altheimer - my office staff will arrange for the appontment.    Orders Added: 1)  Endocrinology Referral [Endocrine] 2)  Est. Patient Level II [16109]

## 2010-09-30 NOTE — Progress Notes (Signed)
Summary: med list update  Phone Note Call from Patient Call back at Home Phone [phone]   Caller: Patient Summary of Call: Pt called to update her med list w/new Rxs..Request callback for information Initial call taken by: Burnard Leigh [facility]),  September 17, 2010 2:39 PM  Follow-up for Phone Call        Called Pt and [facility] Also spoke w/husband at [place] # listed on HIPAA & was informed Pt at work; will be home around 11:30am Follow-up by: Burnard Leigh [facility]),  September 18, 2010 9:15 AM  Additional Follow-up for Phone Call Additional follow up Details #1::        left mess to call office back..................Marland KitchenLamar Sprinkles, CMA  September 23, 2010 6:48 PM     Additional Follow-up for Phone Call Additional follow up Details #2::    Pt seen in office 3/12 Follow-up by: Lamar Sprinkles, CMA,  September 23, 2010 6:48 PM

## 2010-10-01 LAB — ELECTROLYTE PANEL
CO2: 29 mEq/L (ref 19–32)
Chloride: 102 mEq/L (ref 96–112)
Potassium: 4 mEq/L (ref 3.5–5.1)
Sodium: 138 mEq/L (ref 135–145)

## 2010-10-01 LAB — URINALYSIS, ROUTINE W REFLEX MICROSCOPIC
Bilirubin Urine: NEGATIVE
Glucose, UA: NEGATIVE mg/dL
Hgb urine dipstick: NEGATIVE
Ketones, ur: NEGATIVE mg/dL
Nitrite: NEGATIVE
Protein, ur: NEGATIVE mg/dL
Specific Gravity, Urine: 1.028 (ref 1.005–1.030)
Urobilinogen, UA: 1 mg/dL (ref 0.0–1.0)
pH: 5.5 (ref 5.0–8.0)

## 2010-10-01 LAB — GLUCOSE, RANDOM: Glucose, Bld: 76 mg/dL (ref 70–99)

## 2010-10-01 LAB — CBC
HCT: 36 % (ref 36.0–46.0)
Hemoglobin: 12.5 g/dL (ref 12.0–15.0)
MCHC: 34.8 g/dL (ref 30.0–36.0)
MCV: 89.7 fL (ref 78.0–100.0)
Platelets: 257 10*3/uL (ref 150–400)
RBC: 4.01 MIL/uL (ref 3.87–5.11)
RDW: 13.1 % (ref 11.5–15.5)
WBC: 7.1 10*3/uL (ref 4.0–10.5)

## 2010-10-01 LAB — CREATININE, SERUM
Creatinine, Ser: 1.11 mg/dL (ref 0.4–1.2)
GFR calc Af Amer: >60 mL/min (ref >60)
GFR calc non Af Amer: 50 mL/min — ABNORMAL LOW (ref >60)

## 2010-10-01 LAB — BUN: BUN: 26 mg/dL — ABNORMAL HIGH (ref 6–23)

## 2010-10-21 ENCOUNTER — Telehealth: Payer: Self-pay | Admitting: *Deleted

## 2010-10-21 LAB — POCT HEMOGLOBIN-HEMACUE: Hemoglobin: 13.8 g/dL (ref 12.0–15.0)

## 2010-10-21 NOTE — Telephone Encounter (Signed)
Pt aware.

## 2010-10-21 NOTE — Telephone Encounter (Signed)
Maintenance is usually 1000 iu otc Vit D. I do not have a vitamin d level in current EMR.   Plan - vit D level with treatment as indicated. Lab order entered fpor thursday

## 2010-10-21 NOTE — Telephone Encounter (Signed)
Patient requesting rx from Dr Debby Bud for Vit D. She lost rx given previously by her GYN.

## 2010-11-04 ENCOUNTER — Other Ambulatory Visit: Payer: Self-pay | Admitting: *Deleted

## 2010-11-05 MED ORDER — ALPRAZOLAM 0.5 MG PO TABS: 0.5000 mg | ORAL_TABLET | Freq: Two times a day (BID) | ORAL | Status: DC

## 2010-11-05 NOTE — Telephone Encounter (Signed)
Faxed paper request back to [facility]. Updated EPIC

## 2010-11-05 NOTE — Telephone Encounter (Signed)
Ok to fill 

## 2010-11-25 NOTE — Op Note (Signed)
[name], [name]               ACCOUNT NO.:  [id number]   MEDICAL RECORD NO.:  [id number]          PATIENT TYPE:  AMB   LOCATION:  DSC                          FACILITY:  [facility]   PHYSICIAN:  Pasty Spillers. Maple Hudson, M.D. DATE OF BIRTH:  08-May-1946   DATE OF PROCEDURE:  11/23/2008  DATE OF DISCHARGE:  11/23/2008                               OPERATIVE REPORT   PREOPERATIVE DIAGNOSES:  1. Right dissociated vertical deviation.  2. Exotropia, consecutive.   POSTOPERATIVE DIAGNOSES:  1. Right dissociated vertical deviation.  2. Exotropia, consecutive.   PROCEDURES:  1. Right superior rectus muscle recession, 9.0 mm, adjustable      technique.  2. Right lateral rectus muscle recession, 7.0 mm, adjustable      technique.   SURGEON:  Pasty Spillers. Young, MD   ANESTHESIA:  General (laryngeal mask).   COMPLICATIONS:  None.   DESCRIPTION OF PROCEDURE:  After preop evaluation including informed  consent, the patient was taken to the operating room where she was  identified by me.  General anesthesia was induced without difficulty  after placement of appropriate monitors.  The patient was prepped and  draped in a standard sterile fashion.  Lid speculum was placed in the  right eye.   A limbal conjunctival peritomy was made superiorly and temporally in the  right eye with Westcott scissors with relaxing incisions in the  supranasal, supratemporal, and inferotemporal quadrants.  The right  superior rectus muscle was engaged on a series of muscle hooks and  carefully cleared with surrounding fascial attachments and check  ligaments, taking extra care to clear these attachments thoroughly from  the dorsal surface of the muscle.  The tendon was secured with a double-  arm 6-0 Vicryl suture, the double-locking bite at each border of the  muscle, 1 mm from the insertion.  The muscle was disinserted.  Each pole  suture was passed back into the original insertion using direct scleral  passes in  crossed swords fashion.  The muscle was drawn up to the level  of the original insertion.  The two pole sutures were tied together 10  cm above sclera.  The pole sutures were then joined together at a  measured distance of 9.0 mm above the scleral surface using a needle  driver.  A noose suture of 6-0 Vicryl was tied tightly around the two  pole sutures at the top of the needle driver.  The muscle was then  allowed to retract until the new suture reached sclera resulting in a  hang back recession of 9.0 mm.  This procedure was repeated on the  previously resected right lateral rectus muscle, the only difference  being that scar tissue was encountered from previous surgery.  In this  case, the lateral rectus muscle was recessed at 7.0 mm again using  adjustable technique.  The corners of the conjunctival flap and the  supratemporal quadrant were joined with a very large loop of 6-0 plain  gut, leaving the incision opened in that quadrant to facilitate suture  adjustment.  The supranasal and inferotemporal corners of the  conjunctival  flap were reapposed using interrupted sutures of 6-0 plain  gut, leaving the conjunctiva recessed to the level of the original  insertion in each case.  A traction suture of 6-0 silk was placed at the  superior limbus and at the temporal limbus.  TobraDex ointment was  placed in the eye.  The pole, noose, and traction sutures were taped to  the right cheek and right forehead.  A soft patch was placed over the  eye.  The patient was awakened without difficulty and taken to the  recovery room in stable condition having suffered no intraoperative or  immediate postop complications.      Pasty Spillers. Maple Hudson, M.D.  Electronically Signed     WOY/MEDQ  D:  12/12/2008  T:  12/12/2008  Job:  [id number]

## 2010-11-25 NOTE — Discharge Summary (Signed)
NAMEROSSY, VIRAG               ACCOUNT NO.:  [id number]   MEDICAL RECORD NO.:  [id number]          PATIENT TYPE:  INP   LOCATION:  1424                         FACILITY:  [facility]   PHYSICIAN:  Corinna L. Lendell Caprice, MDDATE OF BIRTH:  1945-07-29   DATE OF ADMISSION:  05/01/2008  DATE OF DISCHARGE:  05/07/2008                               DISCHARGE SUMMARY   DISCHARGE DIAGNOSES:  1. Pneumonia  2. Recently diagnosed influenza.  3. Anxiety disorder.  4. Bronchospasm.  5. Insomnia.  6. History of C. diff colitis.  7. History of tobacco abuse, quit 3 weeks ago.   DISCHARGE MEDICATIONS:  1. Doxycycline 100 mg p.o. b.i.d. until gone.  2. Augmentin 500 mg p.o. b.i.d. until gone.  3. Hycodan 5 mL p.o. every 6 hours p.r.n. cough.  4. Imodium as needed for diarrhea.  5. Continue calcium with vitamin D.  6. Multivitamins.  7. Prozac 20 mg daily.  8. Trazodone 50 mg nightly.  9. Vitamin E.  10.Xanax 0.5 mg 1-2 p.o. every  4 p.r.n.  11.Tylenol or ibuprofen as needed for pain or fever.   FOLLOWUP:  With Dr. Corliss Blacker in 4 weeks.  Repeat chest x-ray should be  done in 4-6 weeks to ensure resolution.   CONDITION:  Stable.   CONSULTATIONS:  None.   DIET:  Drink plenty of fluids.   ACTIVITY:  She may return to work in 2 weeks.  Increase activity slowly.  No heavy exertion for 1-2 weeks.   PROCEDURES:  None.   LABORATORIES:  Blood cultures negative.  White blood cell count on  admission 27,000 with 90% neutrophils.  Her white blood cell count  peaked at 32,000 and on October 24 was 12,000.  Complete metabolic panel  on admission significant for a glucose of 176, albumin of 2.6.  Her  glucose on the 24th was 129.   SPECIAL STUDIES/RADIOLOGY:  EKG showed normal sinus rhythm with  nonspecific changes.  Two views of the chest on admission showed  lingular infiltrate and collapse, possible patchy infiltrate at the left  base and some right middle lobe disease.  Repeat chest x-ray on the  24th  showed markedly improved aeration and infiltrate of the lingula,  unchanged right upper lobe opacity.   HISTORY AND HOSPITAL COURSE:  Ms. Aziz is a 65 year old white female  who recently quit smoking.  She had been diagnosed with influenza the  week prior to admission and mistakenly took only 2 days' worth of the  Tamiflu that she was prescribed.  She had a cough for 1 week after  diagnosis and had a chest x-ray, which showed pneumonia, and  leukocytosis was found on CBC done at the office.  She was started on  levofloxacin as an outpatient and was instructed to follow up the next  day.  She subsequently had a white count of 31,000.  She had chest pain,  shortness of breath and was therefore admitted.  She had a temperature  of 100.8, normal vital signs otherwise.  She had dry mucous membranes.  She had scattered rales.  No wheezes initially.  She  subsequently  however did develop some coarse wheezes which improved after nebulizers  during the hospitalization.  She was admitted to telemetry initially,  which was subsequently stopped.  She was placed on droplet precautions.  She initially was started on Rocephin and azithromycin by the admitting  physician, which were subsequently changed to Zosyn and vancomycin.  She  had slow but steady improvement.  She did continue to have a lot of  pleuritic chest pain and abdominal pain.  She also had a lot of problems  with anxiety and insomnia, but by the time of discharge was feeling much  better.  She had normal vital signs.  She was able to ambulate the  halls.  Her cough and shortness of breath as well as energy level were  much improved.  She is tolerating a diet and ready to go home.   Total time on the day of discharge is 45 minutes.      Corinna L. Lendell Caprice, MD  Electronically Signed     CLS/MEDQ  D:  05/07/2008  T:  05/07/2008  Job:  [id number]   cc:   Pam Drown, M.D.  Fax: [phone]

## 2010-11-25 NOTE — H&P (Signed)
[name], [name]               ACCOUNT NO.:  [id number]   MEDICAL RECORD NO.:  [id number]          PATIENT TYPE:  INP   LOCATION:  1424                         [facility]   PHYSICIAN:  Kela Millin, M.D.DATE OF BIRTH:  03-22-1946   DATE OF ADMISSION:  05/01/2008  DATE OF DISCHARGE:                              HISTORY & PHYSICAL   PRIMARY CARE PHYSICIAN:  Dr. Uvaldo Rising.   CHIEF COMPLAINT:  Worsening cough and fever.   HISTORY OF PRESENT ILLNESS:  The patient is a pleasant 65 year old white  female who was diagnosed with influenza last week by her primary care  physician and started on Tamiflu but per PCP she mistakenly only took  the Tamiflu she was prescribed for 48 hours.  She states that she has  had a cough for the past 1 week with subjective fevers and she went back  to see her primary care physician the day prior to admission and a chest  x-ray was done which was consistent with a pneumonia and her white cell  count was also elevated at that time.  She was started on Levaquin and  asked to follow back up today.  When seeing her primary care physician  today she looked much worse and her white cell count was high at 31,000  with her O2 sats 92%.  The patient states that she has continued to have  coughing spells that keep her up at night.  She denies chest pain,  stating that it is only when she coughs that it feels like her whole  core hurts.  She denies dysuria, nausea, vomiting, melena.  No  diarrhea.  She admits to shortness of breath.   She was seen in the ER and a chest x-ray was done which revealed a  lingular infiltrate and collapse, possibly patchy infiltrate also at the  left base and there could also be some right middle lobe disease.  Focal  density right mid lung.  Her white cell count 27.7.  She is admitted for  further evaluation and management.   PAST MEDICAL HISTORY:  1. Anxiety disorder/history of panic attacks.  2. History of degenerative disk  disease/arthritis.   MEDICATIONS:  1. Calcium daily.  2. Multivitamins daily.  3. Prozac 20 mg daily.  4. Trazodone 50 mg q.h.s.  5. Vitamin E.  6. Xanax 0.5 mg two p.o. q.h.s.   ALLERGIES:  CODEINE and SHELLFISH.   SOCIAL HISTORY:  Positive for tobacco, though she states she has not  smoked in 2 weeks, she denies alcohol.   FAMILY HISTORY:  Her sister had diabetes with an MI/coronary artery  disease in her 68s and also stroke.   REVIEW OF SYSTEMS:  As per HPI, other review of systems negative.   PHYSICAL EXAM:  GENERAL:  The patient is an elderly white female,  intermittent coughing spells, no accessory muscle use.  VITAL SIGNS:  Temperature 100.8, blood pressure 128/74 initially 95/48,  pulse is 80, respiratory rate is 20, O2 sat 99%.  HEENT:  PERRL, EOMI, dry mucous membranes and no oral exudates.  NECK:  Supple, no  adenopathy, no thyromegaly and no JVD.  LUNGS:  She has scattered coarse crackles, few rhonchi.  No wheezes.  CARDIOVASCULAR:  Regular rate and rhythm.  Normal S1-S2.  ABDOMEN:  Soft, bowel sounds present, nontender, nondistended.  No  organomegaly and no masses palpable.  EXTREMITIES:  No cyanosis and no edema.  NEUROLOGICAL:  She is alert and oriented x3.  Cranial nerves II-XII  grossly intact.  Nonfocal exam.   LABORATORY DATA:  Chest x-ray as per HPI.  Her white cell count is 27.7,  hemoglobin 12.9, hematocrit 38.1, platelet count 261 and her sodium is  139 with a potassium of 3.7, chloride 108, CO2 of 23, glucose 176, BUN  16, creatinine 0.90, calcium is 8.3.  The albumin is 2.6, AST is 24.   ASSESSMENT AND PLAN:  1. Pneumonia, bilateral - will obtain blood cultures, empiric      antibiotics.  Concerned about is pneumonia in this patient      initially diagnosed with influenza.  Expectorants, supplemental      oxygen and follow.  2. Anxiety disorder - continue outpatient medications - recent      influenza - as above.      Kela Millin,  M.D.  Electronically Signed     ACV/MEDQ  D:  05/02/2008  T:  05/02/2008  Job:  [id number]   cc:   Dr Uvaldo Rising

## 2010-11-25 NOTE — Op Note (Signed)
[name], [name]               ACCOUNT NO.:  [id number]   MEDICAL RECORD NO.:  [id number]          PATIENT TYPE:  AMB   LOCATION:  DSC                          FACILITY:  [facility]   PHYSICIAN:  Pasty Spillers. Maple Hudson, M.D. DATE OF BIRTH:  12-05-1945   DATE OF PROCEDURE:  11/23/2008  DATE OF DISCHARGE:  11/23/2008                               OPERATIVE REPORT   PREOPERATIVE DIAGNOSES:  1. Exotropia.  2. Right dissociated vertical deviation.   POSTOPERATIVE DIAGNOSES:  1. Exotropia.  2. Right dissociated vertical deviation.   PROCEDURES:  1. Right superior rectus muscle recession, 9.0 mm, adjustable      technique.  2. Right lateral rectus muscle recession, 7.0 mm, adjustable      technique.   SURGEON:  Pasty Spillers. Young, MD   ANESTHESIA:  General (laryngeal mask).   COMPLICATIONS:  None.   DESCRIPTION OF PROCEDURE:  After preoperative evaluation including  informed consent, the patient was taken to the operating room where she  was identified by me.  General anesthesia was induced without difficulty  after placement of appropriate monitors.  The patient was prepped and  draped in standard sterile fashion.  Lid speculum was placed in the  right eye.   A limbal conjunctival peritomy was made beginning in the superonasal  quadrant and extending into the inferotemporal quadrant, approximately  180 degrees, with relaxing incisions in the superonasal, superotemporal,  and inferotemporal quadrants.  Through the superior incision, the right  superior rectus muscle was engaged on a series of muscle hooks and  cleared of its fascial attachments, taking special care to clear the  attachments on the superior surface of the muscle.  The tendon was  secured with a double-arm 6-0 Vicryl suture, with a double-locking bite  at each border of the muscle, 1 mm from the insertion.  The muscle was  disinserted.  Each pole suture was passed back into the original stump  in crossed swords fashion,  and the muscle was drawn up to the level of  the original insertion.  The pole sutures were tied together  approximately 10 cm above the sclera.  With the muscle held at the level  of the original insertion, the 2 pole sutures were joined with a needle  driver at a measured distance of 9.0 mm above sclera, and a noose suture  was tied around pole sutures at this location.  The muscle was then  allowed to retract, bringing the new suture down to sclera, and leaving  the superior rectus muscle hung back 9.0 mm.  The same procedure was  performed on the right lateral rectus muscle, except that the muscle was  recessed 7.0 mm instead of 9.0 mm.  Two traction sutures of 6-0 silk  were then placed, one at the superior limbus and one at the temporal  limbus.  The conjunctival flaps were then reapposed in the following  manner.  The superonasal corner of the superior conjunctival flap was  reapposed permanently with 6-0 Vicryl suture, leaving the corner  recessed approximately 7 mm posterior to the limbus.  The relaxing  incision  was also closed.  The inferior corner of the temporal flap was  permanently secured in the same fashion.  The superior corner of the  temporal flap and the lateral corner of the superior flap were then  joined with a very large loop of 6-0 plain gut, leaving these flaps open  to allow access to the suture for adjustment.  The pole, noose,  traction, and conjunctival sutures were then taped to the cheek.  TobraDex ointment was placed in the eye.  a sterile pad was placed over  the eye.  The patient was awakened without difficulty and taken to the  recovery room in stable condition, having suffered no intraoperative or  immediate postoperative complications.      Pasty Spillers. Maple Hudson, M.D.  Electronically Signed     WOY/MEDQ  D:  01/09/2009  T:  01/10/2009  Job:  [id number]

## 2010-11-27 ENCOUNTER — Ambulatory Visit (INDEPENDENT_AMBULATORY_CARE_PROVIDER_SITE_OTHER): Payer: BC Managed Care – PPO | Admitting: Internal Medicine

## 2010-11-27 DIAGNOSIS — F341 Dysthymic disorder: Secondary | ICD-10-CM

## 2010-11-27 DIAGNOSIS — L247 Irritant contact dermatitis due to plants, except food: Secondary | ICD-10-CM

## 2010-11-27 DIAGNOSIS — L255 Unspecified contact dermatitis due to plants, except food: Secondary | ICD-10-CM

## 2010-11-27 MED ORDER — ALPRAZOLAM 0.5 MG PO TABS: 0.5000 mg | ORAL_TABLET | Freq: Two times a day (BID) | ORAL | Status: DC

## 2010-11-27 MED ORDER — FLUOXETINE HCL 40 MG PO CAPS: 40.0000 mg | ORAL_CAPSULE | Freq: Every day | ORAL | Status: DC

## 2010-11-27 MED ORDER — PREDNISONE 10 MG PO TABS: 10.0000 mg | ORAL_TABLET | Freq: Every day | ORAL | Status: AC

## 2010-11-28 NOTE — Progress Notes (Signed)
  Subjective:    Patient ID: [name], female    DOB: 19-Jun-1946, 65 y.o.   MRN: [id number]  HPI Mrs. Gangi, a 43 y/0 [employer] presents for evaluation of a new rash which started 3 days ago after she had done several hours of yard work. The rash is now diffuse, pruritic and has not resp;onded to otc cortisone cream. She has a known elevated allergic response.  In the interval since her last visit she has been evaluated for depiliation by her endocrinologist and her gynecologist with no etiology identified.  Her anxiety level is increased since her last visit. The home situation now involves her step-daughter along with her two teenage sons, her son with his youngster so that what was 2 people is now 23, with all the tribulations of a new multi-generational mixed family.  PMH, FamHx and SocHx reviewed for any changes and relevance.    Review of Systems Review of Systems  Constitutional:  Negative for fever, chills, activity change and unexpected weight change.  HENT:  Negative for hearing loss, ear pain, congestion, neck stiffness and postnasal drip.   Eyes: Negative for pain, discharge and visual disturbance.  Respiratory: Negative for chest tightness and wheezing.   Cardiovascular: Negative for chest pain and palpitations.       [No decreased exercise tolerance Gastrointestinal: [No change in bowel habit. No bloating or gas. No reflux or indigestion Genitourinary: Negative for urgency, frequency, flank pain and difficulty urinating.  Musculoskeletal: Negative for myalgias, back pain, arthralgias and gait problem.  Neurological: Negative for dizziness, tremors, weakness and headaches.  Hematological: Negative for adenopathy.  Psychiatric/Behavioral: Negative for behavioral problems and dysphoric mood.       Objective:   Physical Exam Vitals reviewed Gen'l- WNWD WW in no distress HEENT - C&S clear Pul- normal respirations Cor- RRR Neuro - A&O, no focal signs Derm - linear  maculopapular lesions along the torso, neck, ears and anterior chest.       Assessment & Plan:  1. Contact dermatitis - likely poison ivey/oak.  Plan - systemic steroids           Antihistamines type 1&2  2. Anxiety - family issues as an exacerbating factor. She does appear stable - no evidence of destructive thoughts.  Plan - Family therapy: recommended she and her husband go first and then bring in the remainder of the family. Target - issues of communication, social interaction rules (house rules), monitoring and intervening into dangerous behavioral patterns           Increased fluoxetine and refilled alprazolam for break through anxiety.

## 2010-11-28 NOTE — Op Note (Signed)
[name], [name]               ACCOUNT NO.:  [id number]   MEDICAL RECORD NO.:  [id number]          PATIENT TYPE:  AMB   LOCATION:  DSC                          FACILITY:  [facility]   PHYSICIAN:  Pasty Spillers. Maple Hudson, M.D. DATE OF BIRTH:  1945/12/25   DATE OF PROCEDURE:  01/09/2005  DATE OF DISCHARGE:                                 OPERATIVE REPORT   PREOP DIAGNOSIS:  Recurrent esotropia, status post bilateral medial rectus  muscle recessions.   POSTOP DIAGNOSIS:  Recurrent esotropia, status post bilateral medial rectus  muscle recessions.   PROCEDURE:  Lateral rectus muscle resection, 5.0 mm, OU   SURGEON:  Pasty Spillers. Maple Hudson, M.D.   ANESTHESIA:  General (laryngeal mask).   COMPLICATIONS:  None.   PROCEDURE IN DETAIL:  After routine preoperative evaluation including  informed consent, the patient was taken to the operating room where she was  identified by me. General anesthesia was induced without difficulty after  placement of appropriate monitors. The patient was prepped and draped in  standard sterile fashion. Lid speculum was placed in the right eye.   Through an inferonasal fornix incision through conjunctiva and Tenon's  fascia, the right lateral rectus muscle was engaged on a series of muscle  hooks and cleared of its fascial attachment. The muscle was spread between  two self-retaining hooks. A 2 mm bite was taken over the center of the  muscle belly at a measured distance 5.0 mm posterior to the insertion, and a  knot was tied securely at this location. The needle at each end of the  double-arm suture was passed from the center of muscle belly to the  periphery, 5 mm posterior to the insertion, and a double-locking bite was  placed at each border of the muscle. A resection clamp was placed in the  muscle just anterior to the sutures. The muscle was disinserted. Each pole  suture was passed posteriorly to anteriorly through the periphery of the  muscle stump, then  anteriorly to posteriorly near the center of the stump,  then posteriorly to anteriorly through the center of muscle belly, just  posterior to the previously placed knot. The muscle was drawn up to the  level of the original insertion, and all slack was removed before the suture  ends were tied securely. The clamp was removed and the port the portion of  muscle anterior to the sutures was carefully excised. Conjunctiva was  reapposed to the limbus with 6-0 plain gut sutures. The lid speculum was  transferred to the left eye, an identical procedure was performed, again  effecting a 5.0 mm resection of the lateral rectus muscle via a limbal  incision. TobraDex ointment was placed in each eye. The patient was awakened  without difficulty and taken to the recovery room in stable condition having  suffered no intraoperative or immediate postoperative complications.     WOY/MEDQ  D:  01/09/2005  T:  01/09/2005  Job:  [id number]

## 2011-01-21 ENCOUNTER — Telehealth: Payer: Self-pay | Admitting: *Deleted

## 2011-01-21 ENCOUNTER — Encounter: Payer: Self-pay | Admitting: Internal Medicine

## 2011-01-21 DIAGNOSIS — K589 Irritable bowel syndrome without diarrhea: Secondary | ICD-10-CM

## 2011-01-21 MED ORDER — AZITHROMYCIN 250 MG PO TABS: ORAL_TABLET | ORAL | Status: AC

## 2011-01-21 NOTE — Telephone Encounter (Signed)
Patient requesting RX from MD. She "knows" she has a sinus infection, usually goes to Dr Joneen Roach but cost is 70 dollars. She is c/o little fever, ear & facial pressure/pain, productive cough w/yellow mucus - she says she has history of pneumonia.

## 2011-01-21 NOTE — Telephone Encounter (Signed)
Patient informed, rx completed.

## 2011-01-21 NOTE — Telephone Encounter (Signed)
z-pak as directed; mucinex two tabs bid; hydrate

## 2011-01-22 ENCOUNTER — Ambulatory Visit: Payer: BC Managed Care – PPO | Admitting: Internal Medicine

## 2011-03-09 ENCOUNTER — Ambulatory Visit (INDEPENDENT_AMBULATORY_CARE_PROVIDER_SITE_OTHER): Payer: BC Managed Care – PPO | Admitting: Internal Medicine

## 2011-03-09 VITALS — BP 126/78 | HR 72 | Temp 98.1°F | Wt 126.0 lb

## 2011-03-09 DIAGNOSIS — L03811 Cellulitis of head [any part, except face]: Secondary | ICD-10-CM

## 2011-03-09 DIAGNOSIS — L03818 Cellulitis of other sites: Secondary | ICD-10-CM

## 2011-03-09 DIAGNOSIS — L02818 Cutaneous abscess of other sites: Secondary | ICD-10-CM

## 2011-03-09 MED ORDER — DOXYCYCLINE HYCLATE 100 MG PO TABS: 100.0000 mg | ORAL_TABLET | Freq: Two times a day (BID) | ORAL | Status: AC

## 2011-03-09 NOTE — Progress Notes (Signed)
  Subjective:    Patient ID: [name], female    DOB: 1946-03-28, 65 y.o.   MRN: [id number]  HPI [name] presents for pain and inflammation behind the left ear. This has been a problem for 48 hrs. She has had no injury. No new jewelry or new contact allergens. NO drainage, no fever.  I have reviewed the patient's medical history in detail and updated the computerized patient record.    Review of Systems System review is negative for any constitutional, cardiac, pulmonary, GI or neuro symptoms or complaints     Objective:   Physical Exam Vitals - no temperature gen'l- WNWD white woman in no distress HEENT - EAC/TM normla on the left Derm - area behind the ear is red, warm and tender, question of fluctuance.       Assessment & Plan:  Cellulitis - left posterior ear.   Plan - doxycycline 100mg  bid x 7           Warm compresses bid-tid.

## 2011-03-09 NOTE — Patient Instructions (Signed)
Infection behind the ear - cellulitis. No open wound or lesion., Plan - doxycycline 100 mg twice a day for 7 days. Warm compress to the infected are three times a day. Tylenol as needed for any fever or pain.   Cellulitis Cellulitis is an infection of the skin and the tissue beneath it. The area is typically red and tender. It is caused by germs (bacteria) (usually staph or strep) that enter the body through cuts or sores. Cellulitis most commonly occurs in the arms or lower legs.   HOME CARE INSTRUCTIONS  If you are given a prescription for medications which kill germs (antibiotics), take as directed until finished.   If the infection is on the arm or leg, keep the limb elevated as able.   Use a warm cloth several times per day to relieve pain and encourage healing.   See your caregiver for recheck of the infected site in no days or sooner if problems arise.   Only take over-the-counter or prescription medicines for pain, discomfort, or fever as directed by your caregiver.  SEEK MEDICAL CARE IF:  An oral temperature above 100 develops, not controlled by medication.   The area of redness (inflammation) is spreading, there are red streaks coming from the infected site, or if a part of the infection begins to turn dark in color.   The joint or bone underneath the infected skin becomes painful after the skin has healed.   The infection returns in the same or another area after it seems to have gone away.   A boil or bump swells up. This may be an abscess.   New, unexplained problems such as pain or fever develop.  SEEK IMMEDIATE MEDICAL CARE IF:  You or your child feels drowsy or lethargic.   There is vomiting, diarrhea, or lasting discomfort or feeling ill (malaise) with muscle aches and pains.  MAKE SURE YOU:    Understand these instructions.   Will watch your condition.   Will get help right away if you are not doing well or get worse.  Document Released: 04/08/2005 Document  Re-Released: 04/26/2009 [facility] Patient Information 2011 [place], [place].

## 2011-03-25 ENCOUNTER — Other Ambulatory Visit (INDEPENDENT_AMBULATORY_CARE_PROVIDER_SITE_OTHER): Payer: BC Managed Care – PPO

## 2011-03-25 ENCOUNTER — Telehealth: Payer: Self-pay | Admitting: *Deleted

## 2011-03-25 DIAGNOSIS — N3000 Acute cystitis without hematuria: Secondary | ICD-10-CM

## 2011-03-25 LAB — URINALYSIS, ROUTINE W REFLEX MICROSCOPIC
Bilirubin Urine: NEGATIVE
Hgb urine dipstick: NEGATIVE
Ketones, ur: NEGATIVE
Nitrite: NEGATIVE
Specific Gravity, Urine: >=1.03 (ref 1.000–1.030)
Total Protein, Urine: NEGATIVE
Urine Glucose: NEGATIVE
Urobilinogen, UA: 0.2 (ref 0.0–1.0)
pH: 5.5 (ref 5.0–8.0)

## 2011-03-25 MED ORDER — SULFAMETHOXAZOLE-TRIMETHOPRIM 800-160 MG PO TABS: 1.0000 | ORAL_TABLET | Freq: Two times a day (BID) | ORAL | Status: AC

## 2011-03-25 NOTE — Telephone Encounter (Signed)
Pt c/o urinary discomfort and req labs to check for UTI. Labs ordered, pt coming in today. HOLD OPEN FOR RESULTS

## 2011-03-25 NOTE — Telephone Encounter (Signed)
Results ready please advise.

## 2011-03-25 NOTE — Telephone Encounter (Signed)
Patient informed. 

## 2011-03-25 NOTE — Telephone Encounter (Signed)
Septra DS bid x 3 days

## 2011-04-14 LAB — CBC
HCT: 35.2 — ABNORMAL LOW
HCT: 35.2 — ABNORMAL LOW
HCT: 36.2
HCT: 38.1
Hemoglobin: 11.8 — ABNORMAL LOW
Hemoglobin: 12
Hemoglobin: 12.2
Hemoglobin: 12.9
MCHC: 33.6
MCHC: 33.6
MCHC: 33.9
MCHC: 34
MCV: 93.6
MCV: 95.2
MCV: 95.5
MCV: 96.1
Platelets: 261
Platelets: 274
Platelets: 319
Platelets: 335
RBC: 3.68 — ABNORMAL LOW
RBC: 3.7 — ABNORMAL LOW
RBC: 3.77 — ABNORMAL LOW
RBC: 4.07
RDW: 12.4
RDW: 12.7
RDW: 12.8
RDW: 13.1
WBC: 12.6 — ABNORMAL HIGH
WBC: 26.3 — ABNORMAL HIGH
WBC: 27.7 — ABNORMAL HIGH
WBC: 32.4 — ABNORMAL HIGH

## 2011-04-14 LAB — BASIC METABOLIC PANEL
BUN: 12
BUN: 12
BUN: 14
CO2: 23
CO2: 24
CO2: 27
Calcium: 8.7
Calcium: 8.8
Calcium: 8.9
Chloride: 107
Chloride: 108
Chloride: 110
Creatinine, Ser: 0.67
Creatinine, Ser: 0.78
Creatinine, Ser: 0.81
GFR calc Af Amer: >60
GFR calc Af Amer: >60
GFR calc Af Amer: >60
GFR calc non Af Amer: >60
GFR calc non Af Amer: >60
GFR calc non Af Amer: >60
Glucose, Bld: 129 — ABNORMAL HIGH
Glucose, Bld: 131 — ABNORMAL HIGH
Glucose, Bld: 136 — ABNORMAL HIGH
Potassium: 4
Potassium: 4
Potassium: 4
Sodium: 139
Sodium: 141
Sodium: 141

## 2011-04-14 LAB — COMPREHENSIVE METABOLIC PANEL
ALT: 19
AST: 24
Albumin: 2.6 — ABNORMAL LOW
Alkaline Phosphatase: 23 — ABNORMAL LOW
BUN: 16
CO2: 23
Calcium: 8.3 — ABNORMAL LOW
Chloride: 108
Creatinine, Ser: 0.9
GFR calc Af Amer: >60
GFR calc non Af Amer: >60
Glucose, Bld: 176 — ABNORMAL HIGH
Potassium: 3.7
Sodium: 139
Total Bilirubin: 0.8
Total Protein: 5.6 — ABNORMAL LOW

## 2011-04-14 LAB — DIFFERENTIAL
Basophils Absolute: 0
Basophils Absolute: 0.3 — ABNORMAL HIGH
Basophils Relative: 0
Basophils Relative: 1
Eosinophils Absolute: 0
Eosinophils Absolute: 0
Eosinophils Relative: 0
Eosinophils Relative: 0
Lymphocytes Relative: 4 — ABNORMAL LOW
Lymphocytes Relative: 4 — ABNORMAL LOW
Lymphs Abs: 1.1
Lymphs Abs: 1.1
Monocytes Absolute: 1.1 — ABNORMAL HIGH
Monocytes Absolute: 1.4 — ABNORMAL HIGH
Monocytes Relative: 4
Monocytes Relative: 5
Neutro Abs: 24.1 — ABNORMAL HIGH
Neutro Abs: 24.9 — ABNORMAL HIGH
Neutrophils Relative %: 90 — ABNORMAL HIGH
Neutrophils Relative %: 92 — ABNORMAL HIGH

## 2011-04-14 LAB — CULTURE, BLOOD (ROUTINE X 2)
Culture: NO GROWTH
Culture: NO GROWTH

## 2011-04-14 LAB — VANCOMYCIN, TROUGH
Vancomycin Tr: 10.9
Vancomycin Tr: 21.7 — ABNORMAL HIGH

## 2011-05-05 ENCOUNTER — Ambulatory Visit (INDEPENDENT_AMBULATORY_CARE_PROVIDER_SITE_OTHER): Payer: BC Managed Care – PPO | Admitting: Internal Medicine

## 2011-05-05 VITALS — BP 120/80 | HR 69 | Temp 98.4°F | Wt 127.0 lb

## 2011-05-05 DIAGNOSIS — J040 Acute laryngitis: Secondary | ICD-10-CM

## 2011-05-05 MED ORDER — AMOXICILLIN 875 MG PO TABS: 875.0000 mg | ORAL_TABLET | Freq: Two times a day (BID) | ORAL | Status: AC

## 2011-05-05 NOTE — Patient Instructions (Signed)
Upper respiratory infection with laryngitis - viral vs bacterial. The fever leans towards bacterial. Plan - amoxicillin twice a day for 7 days; tylenol for fever; claritin for drainage; hot tea with tablespoon of lemon and tablespoon of honey; rest your voice - whisper.    Laryngitis At the top of your windpipe is your voice box. It is the source of your voice. Inside your voice box are 2 bands of muscles called vocal cords. When you breathe, your vocal cords are relaxed and open so that air can get into the lungs. When you decide to say something, these cords come together and vibrate. The sound from these vibrations goes into your throat and comes out through your mouth as sound.  Laryngitis is an inflammation of the vocal cords that causes hoarseness, cough, loss of voice, sore throat, and dry throat. Laryngitis can often be related to a viral infection, excessive smoking, excessive talking or yelling, inhalation of toxic fumes, allergies, and reflux of acid from your stomach.  CAUSES Laryngitis can be temporary (acute) or long-term (chronic). Most cases of acute laryngitis improve with time. The typical cause of acute laryngitis is viral infection, vocal strain, measles or mumps, or bacterial infections. Chronic laryngitis lasts for more than 3 weeks. It is usually caused by vocal cord strain, vocal cord injury, postnasal drip, growths on the vocal cords, or acid reflux. RISK FACTORS  Respiratory infections.     Exposure to irritating substances, such as cigarette smoke, excessive amounts of alcohol, stomach acids, and workplace chemicals.     Voice trauma, such as vocal cord injury from shouting or speaking too loud.  DIAGNOSIS   The most common sign of laryngitis is hoarseness. This can range from partial to total loss of your voice. Laryngoscopy may be necessary. This allows your caregiver to look into the larynx in order to diagnose your condition. HOME CARE INSTRUCTIONS  Drink enough  fluids to keep your urine clear or pale yellow.     Rest until you no longer have symptoms or as directed by your caregiver.     Breathe in moist air.     Take all medicine as directed by your caregiver.     Do not smoke.     Talk as little as possible (this includes whispering).     Write on paper instead of talking until your voice is back to normal.     Follow up with your caregiver if your condition has not improved after 10 days.  SEEK MEDICAL CARE IF:    You have trouble breathing.     You cough up blood.     You have persistent fever.     You have increasing pain.     You have difficulty swallowing.  Document Released: 06/29/2005 Document Revised: 03/11/2011 Document Reviewed: 09/04/2010 [facility] Patient Information 2012 [place], [place].

## 2011-05-05 NOTE — Progress Notes (Signed)
  Subjective:    Patient ID: [name], female    DOB: August 17, 1945, 65 y.o.   MRN: [id number]  HPI Mrs. [name] presents with a 48 hour h/o sore throat, tender cervical glands, post-nasal drainage, fatiague and low grade fever. She has had no rigors, no N/V/D.   I have reviewed the patient's medical history in detail and updated the computerized patient record.    Review of Systems System review is negative for any constitutional, cardiac, pulmonary, GI or neuro symptoms or complaints other than as described in the HPI.       Objective:   Physical Exam Vitals reviewed - normal, no fever gen'l - WNWD white woman in no distress HEENT- TMs normal, throat clear without erythema or exudate Nodes - no cervical or submandiublar nodes palpated. Chest clear       Assessment & Plan:  URI - viral vs bacrterial with laryngitis and low grade fever.  Plan - amoxicillin 875 mg bid x 7           Comfort: claritin, APAP, hot tea

## 2011-05-06 ENCOUNTER — Other Ambulatory Visit: Payer: Self-pay | Admitting: Internal Medicine

## 2011-05-07 NOTE — Telephone Encounter (Signed)
Please advise regarding RF request.  

## 2011-05-08 NOTE — Telephone Encounter (Signed)
ok 

## 2011-05-28 ENCOUNTER — Other Ambulatory Visit: Payer: Self-pay | Admitting: Internal Medicine

## 2011-05-29 NOTE — Telephone Encounter (Signed)
Rx faxed to pharmacy  

## 2011-06-23 ENCOUNTER — Other Ambulatory Visit: Payer: Self-pay | Admitting: Endocrinology

## 2011-06-24 NOTE — Telephone Encounter (Signed)
Ok on xanax refill x 5

## 2011-06-29 ENCOUNTER — Other Ambulatory Visit: Payer: Self-pay

## 2011-06-29 NOTE — Telephone Encounter (Signed)
A user error has taken place: encounter opened in error, closed for administrative reasons.

## 2011-06-29 NOTE — Telephone Encounter (Signed)
Pt called requesting states of medication refill, please advise pt.

## 2011-06-30 ENCOUNTER — Telehealth: Payer: Self-pay | Admitting: *Deleted

## 2011-06-30 MED ORDER — ALPRAZOLAM 0.5 MG PO TABS: 0.5000 mg | ORAL_TABLET | Freq: Two times a day (BID) | ORAL | Status: DC

## 2011-06-30 NOTE — Telephone Encounter (Signed)
Pt called regarding Alprazolam refill--VO per Dr Debby Bud phoned in Rx to pharmacy.

## 2011-07-21 ENCOUNTER — Ambulatory Visit (INDEPENDENT_AMBULATORY_CARE_PROVIDER_SITE_OTHER): Payer: BC Managed Care – PPO | Admitting: Internal Medicine

## 2011-07-21 DIAGNOSIS — Z5181 Encounter for therapeutic drug level monitoring: Secondary | ICD-10-CM

## 2011-07-21 DIAGNOSIS — M810 Age-related osteoporosis without current pathological fracture: Secondary | ICD-10-CM

## 2011-07-21 DIAGNOSIS — E875 Hyperkalemia: Secondary | ICD-10-CM

## 2011-07-21 MED ORDER — ALPRAZOLAM 0.5 MG PO TABS: 0.5000 mg | ORAL_TABLET | Freq: Two times a day (BID) | ORAL | Status: DC

## 2011-07-21 MED ORDER — SUMATRIPTAN SUCCINATE 6 MG/0.5ML ~~LOC~~ SOLN: 6.0000 mg | Freq: Once | SUBCUTANEOUS | Status: DC

## 2011-07-21 MED ORDER — TRAZODONE HCL 50 MG PO TABS: 50.0000 mg | ORAL_TABLET | Freq: Every day | ORAL | Status: DC

## 2011-07-21 NOTE — Patient Instructions (Signed)
Full review of medication list updated and cleaned up.

## 2011-07-21 NOTE — Progress Notes (Signed)
  Subjective:    Patient ID: [name], female    DOB: 02/09/1946, 66 y.o.   MRN: [id number]  HPI [name] presents for medication refill. She reports that her [facility] pharmacy has been unsuccessful in getting [name] to refill her xanax. Record reviewed: Dec 18th telphone not with refill for 1 month.  I have reviewed the patient's medical history in detail and updated the computerized patient record.    Review of Systems     Objective:   Physical Exam Filed Vitals:   07/21/11 1407  BP: 112/76  Pulse: 67  Temp: 98.1 F (36.7 C)   Gen'l- WNWD white woman in no distress Pulm - normal respirations  Cor - RRR Neuro- A&O x 3        Assessment & Plan:  Medication management - reviewed her med list and brought it up to date. Provided appropriate refills.

## 2011-07-22 ENCOUNTER — Other Ambulatory Visit (INDEPENDENT_AMBULATORY_CARE_PROVIDER_SITE_OTHER): Payer: BC Managed Care – PPO

## 2011-07-22 DIAGNOSIS — E875 Hyperkalemia: Secondary | ICD-10-CM

## 2011-07-22 DIAGNOSIS — M810 Age-related osteoporosis without current pathological fracture: Secondary | ICD-10-CM

## 2011-07-22 LAB — COMPREHENSIVE METABOLIC PANEL
ALT: 34 U/L (ref 0–35)
AST: 31 U/L (ref 0–37)
Albumin: 4.3 g/dL (ref 3.5–5.2)
Alkaline Phosphatase: 32 U/L — ABNORMAL LOW (ref 39–117)
BUN: 30 mg/dL — ABNORMAL HIGH (ref 6–23)
CO2: 30 mEq/L (ref 19–32)
Calcium: 9.3 mg/dL (ref 8.4–10.5)
Chloride: 106 mEq/L (ref 96–112)
Creatinine, Ser: 1 mg/dL (ref 0.4–1.2)
GFR: 58.7 mL/min — ABNORMAL LOW (ref >60.00)
Glucose, Bld: 81 mg/dL (ref 70–99)
Potassium: 4.7 mEq/L (ref 3.5–5.1)
Sodium: 143 mEq/L (ref 135–145)
Total Bilirubin: 0.8 mg/dL (ref 0.3–1.2)
Total Protein: 6.9 g/dL (ref 6.0–8.3)

## 2011-07-22 LAB — CBC WITH DIFFERENTIAL/PLATELET
Basophils Absolute: 0 10*3/uL (ref 0.0–0.1)
Basophils Relative: 0.5 % (ref 0.0–3.0)
Eosinophils Absolute: 0.1 10*3/uL (ref 0.0–0.7)
Eosinophils Relative: 2.4 % (ref 0.0–5.0)
HCT: 41.5 % (ref 36.0–46.0)
Hemoglobin: 14.1 g/dL (ref 12.0–15.0)
Lymphocytes Relative: 23 % (ref 12.0–46.0)
Lymphs Abs: 1.4 10*3/uL (ref 0.7–4.0)
MCHC: 34.1 g/dL (ref 30.0–36.0)
MCV: 93.1 fl (ref 78.0–100.0)
Monocytes Absolute: 0.6 10*3/uL (ref 0.1–1.0)
Monocytes Relative: 9.5 % (ref 3.0–12.0)
Neutro Abs: 3.9 10*3/uL (ref 1.4–7.7)
Neutrophils Relative %: 64.6 % (ref 43.0–77.0)
Platelets: 257 10*3/uL (ref 150.0–400.0)
RBC: 4.45 Mil/uL (ref 3.87–5.11)
RDW: 12 % (ref 11.5–14.6)
WBC: 6.1 10*3/uL (ref 4.5–10.5)

## 2011-07-25 LAB — VITAMIN D 1,25 DIHYDROXY
Vitamin D 1, 25 (OH)2 Total: 85 pg/mL — ABNORMAL HIGH (ref 18–72)
Vitamin D2 1, 25 (OH)2: 33 pg/mL
Vitamin D3 1, 25 (OH)2: 52 pg/mL

## 2011-07-27 ENCOUNTER — Encounter: Payer: Self-pay | Admitting: Internal Medicine

## 2011-07-27 ENCOUNTER — Ambulatory Visit: Payer: BC Managed Care – PPO

## 2011-07-30 ENCOUNTER — Telehealth: Payer: Self-pay | Admitting: *Deleted

## 2011-07-30 DIAGNOSIS — L659 Nonscarring hair loss, unspecified: Secondary | ICD-10-CM

## 2011-07-30 NOTE — Telephone Encounter (Signed)
Pt states that she had lab work done last OV and the results were abnormal. She is concerned about her thyroid. Does she need another OV? Please advise.

## 2011-07-30 NOTE — Telephone Encounter (Signed)
Thyroid checked Oxt '11 - TSH was ok. Jan 9th Vit D levels were checked along with CBC , metabolic - all OK.

## 2011-07-31 NOTE — Telephone Encounter (Signed)
Explained to pt you said her labs were OK & she is still insisting on a TSH. Please advise.

## 2011-08-01 NOTE — Telephone Encounter (Signed)
Ok. TSH order entered.

## 2011-08-03 ENCOUNTER — Other Ambulatory Visit (INDEPENDENT_AMBULATORY_CARE_PROVIDER_SITE_OTHER): Payer: BC Managed Care – PPO

## 2011-08-03 DIAGNOSIS — L659 Nonscarring hair loss, unspecified: Secondary | ICD-10-CM

## 2011-08-03 LAB — TSH: TSH: 0.56 u[IU]/mL (ref 0.35–5.50)

## 2011-08-03 NOTE — Telephone Encounter (Signed)
Spoke with pt & she will be in this week for her TSH.

## 2011-08-04 ENCOUNTER — Encounter: Payer: Self-pay | Admitting: Internal Medicine

## 2011-08-11 ENCOUNTER — Ambulatory Visit: Payer: BC Managed Care – PPO | Admitting: Internal Medicine

## 2011-09-14 ENCOUNTER — Telehealth: Payer: Self-pay | Admitting: *Deleted

## 2011-09-14 NOTE — Telephone Encounter (Signed)
Pt left vm req Rf on meloxicam 15 mg. She states she takes this for her back pain. I dont see med on her active list. Please advise.

## 2011-09-14 NOTE — Telephone Encounter (Signed)
Ok for meloxicam 15 mg once a day, #30 refill 11. Add to med list

## 2011-09-15 MED ORDER — MELOXICAM 15 MG PO TABS: 15.0000 mg | ORAL_TABLET | Freq: Every day | ORAL | Status: DC

## 2011-09-15 NOTE — Telephone Encounter (Signed)
Done

## 2011-11-30 ENCOUNTER — Other Ambulatory Visit: Payer: Self-pay | Admitting: Internal Medicine

## 2011-12-17 ENCOUNTER — Telehealth: Payer: Self-pay | Admitting: Internal Medicine

## 2011-12-17 NOTE — Telephone Encounter (Signed)
Caller: [name]/Patient; PCP: [name]; CB#: [phone]; Call regarding Poison Ivy; rash started 12/17/11; rash is located on face and neck; she states that she is very allergic to poison ivy; eyes are swollen; lips are swollen; face swollen; no diff breathing; she was out pulling weeds on 12/15/11; unsure what poison it is; Triaged per [facility] or [place] Exposure Guideline; See in 4 hr d/t involves eyes and mouth; will comply; no appt in EPIC; called office and instructed to send pt to UC; pt aware and will comply; info given on [facility]; will comply

## 2012-02-02 ENCOUNTER — Other Ambulatory Visit: Payer: Self-pay | Admitting: *Deleted

## 2012-02-02 NOTE — Telephone Encounter (Signed)
Patient request refill on trazodone   Last OV 07/21/2011

## 2012-02-02 NOTE — Telephone Encounter (Signed)
Ok for refill prn 

## 2012-02-03 MED ORDER — TRAZODONE HCL 50 MG PO TABS: 50.0000 mg | ORAL_TABLET | Freq: Every day | ORAL | Status: DC

## 2012-02-04 ENCOUNTER — Other Ambulatory Visit: Payer: Self-pay | Admitting: *Deleted

## 2012-02-04 MED ORDER — TRAZODONE HCL 50 MG PO TABS: 50.0000 mg | ORAL_TABLET | Freq: Every day | ORAL | Status: DC

## 2012-02-04 NOTE — Telephone Encounter (Signed)
Medication trazodone called to [facility]

## 2012-02-06 ENCOUNTER — Other Ambulatory Visit: Payer: Self-pay | Admitting: Internal Medicine

## 2012-07-07 ENCOUNTER — Other Ambulatory Visit: Payer: Self-pay | Admitting: Internal Medicine

## 2012-07-08 ENCOUNTER — Encounter: Payer: Self-pay | Admitting: Internal Medicine

## 2012-08-05 ENCOUNTER — Other Ambulatory Visit: Payer: Self-pay | Admitting: Internal Medicine

## 2012-08-08 ENCOUNTER — Other Ambulatory Visit: Payer: Self-pay | Admitting: *Deleted

## 2012-08-08 MED ORDER — TRAZODONE HCL 50 MG PO TABS: 50.0000 mg | ORAL_TABLET | Freq: Every day | ORAL | Status: DC

## 2012-08-22 ENCOUNTER — Encounter: Payer: Self-pay | Admitting: Internal Medicine

## 2012-08-22 ENCOUNTER — Ambulatory Visit (INDEPENDENT_AMBULATORY_CARE_PROVIDER_SITE_OTHER): Payer: BC Managed Care – PPO | Admitting: Internal Medicine

## 2012-08-22 VITALS — BP 118/68 | HR 73 | Temp 97.3°F | Resp 10 | Wt 132.1 lb

## 2012-08-22 DIAGNOSIS — Z23 Encounter for immunization: Secondary | ICD-10-CM

## 2012-08-22 DIAGNOSIS — K589 Irritable bowel syndrome without diarrhea: Secondary | ICD-10-CM

## 2012-08-22 DIAGNOSIS — R259 Unspecified abnormal involuntary movements: Secondary | ICD-10-CM

## 2012-08-22 DIAGNOSIS — F341 Dysthymic disorder: Secondary | ICD-10-CM

## 2012-08-22 DIAGNOSIS — R253 Fasciculation: Secondary | ICD-10-CM

## 2012-08-22 DIAGNOSIS — R7309 Other abnormal glucose: Secondary | ICD-10-CM

## 2012-08-22 DIAGNOSIS — Z1239 Encounter for other screening for malignant neoplasm of breast: Secondary | ICD-10-CM

## 2012-08-22 DIAGNOSIS — E7800 Pure hypercholesterolemia, unspecified: Secondary | ICD-10-CM

## 2012-08-22 DIAGNOSIS — R030 Elevated blood-pressure reading, without diagnosis of hypertension: Secondary | ICD-10-CM

## 2012-08-22 DIAGNOSIS — Z0000 Encounter for general adult medical examination without abnormal findings: Secondary | ICD-10-CM

## 2012-08-22 DIAGNOSIS — Z1211 Encounter for screening for malignant neoplasm of colon: Secondary | ICD-10-CM

## 2012-08-22 DIAGNOSIS — T50995A Adverse effect of other drugs, medicaments and biological substances, initial encounter: Secondary | ICD-10-CM

## 2012-08-22 NOTE — Progress Notes (Signed)
Subjective:    Patient ID: [name], female    DOB: 11-26-1945, 67 y.o.   MRN: [id number]  HPI Mrs. [name] presents follow up. She is due for colonoscopy.   She feels that she is having twitching muscles on the left side of her face: for about a month with progression of sensation from periorbital to the whole left face. No paresthesia, no focal weakness. She can feel the shaking with her hand.  She did have H1N1 influenza.   Past Medical History  Diagnosis Date  . HYPERCHOLESTEROLEMIA 03/16/2008  . ANXIETY DEPRESSION 07/25/2009  . COMMON MIGRAINE 03/16/2008  . ALLERGIC RHINITIS CAUSE UNSPECIFIED 11/12/2009  . Irritable bowel syndrome 11/12/2009  . ALOPECIA 03/16/2008  . LOW BACK PAIN, CHRONIC 07/19/2009  . OSTEOPOROSIS 07/08/2010  . CHICKENPOX, HX OF 03/16/2008  . DIVERTICULITIS, HX OF 03/16/2008  . HYPERGLYCEMIA 03/16/2008  . Anxiety    Past Surgical History  Procedure Laterality Date  . Cholecystectomy    . Partial colectomy      in her 20's  . Correction of esotropic  June 06', May '10   Family History  Problem Relation Age of Onset  . Heart disease Mother     CHF  . Coronary artery disease Mother   . Hypertension Mother   . Hyperlipidemia Mother   . Heart disease Father     CHF  . Coronary artery disease Father   . Diabetes Sister     with all the complications  . Coronary artery disease Sister   . Stroke Sister 50    CVA  . Peripheral vascular disease Sister   . Cancer Neg Hx     breast or colon   History   Social History  . Marital Status: Married    Spouse Name: N/A    Number of Children: N/A  . Years of Education: N/A   Occupational History  . Retired Printmaker)     Social History Main Topics  . Smoking status: Former Games developer  . Smokeless tobacco: Never Used  . Alcohol Use: No  . Drug Use: No  . Sexually Active: Yes -- Female partner(s)   Other Topics Concern  . 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   Marriage in good health.      Physician roster   Gyn- Rock Nephew, NP   Opthal- Dr Maple Hudson (pediatric opthal)   GI- dr Tommye Standard- Dr Darrelyn Hillock   ENT- Dr Haroldine Laws             \ Current Outpatient Prescriptions on File Prior to Visit  Medication Sig Dispense Refill  . alendronate (FOSAMAX) 70 MG tablet Take 70 mg by mouth every 7 (seven) days. Take with a full glass of water on an empty stomach.       . ALPRAZolam (XANAX) 0.5 MG tablet TAKE ONE TABLET BY MOUTH TWICE DAILY  60 tablet  4  . estradiol (ESTRACE) 0.1 MG/GM vaginal cream Place 2 g vaginally daily.        Marland Kitchen FLUoxetine (PROZAC) 40 MG capsule TAKE ONE CAPSULE BY MOUTH EVERY DAY  30 capsule  4  . meloxicam (MOBIC) 15 MG tablet Take 1 tablet (15 mg total) by mouth daily.  30 tablet  11  . MULTIPLE  VITAMIN PO Take 1 tablet by mouth daily.       . SUMAtriptan (IMITREX) 6 MG/0.5ML SOLN injection Inject 0.5 mLs (6 mg total) into the skin once. Subcutaneously injection as needed migraine  1 vial  3  . traZODone (DESYREL) 50 MG tablet Take 1 tablet (50 mg total) by mouth at bedtime. MAY TAKE ONLY AS NEEDED PER DR. NORINS  30 tablet  5   No current facility-administered medications on file prior to visit.      Review of Systems System review is negative for any constitutional, cardiac, pulmonary, GI or neuro symptoms or complaints other than as described in the HPI.     Objective:   Physical Exam Filed Vitals:   08/22/12 1505  BP: 118/68  Pulse: 73  Temp: 97.3 F (36.3 C)  Resp: 10   Gen'l - WQNWD white woman in no distress HEENT- [place]/AT, C&S clear Neck - supple, no thyromegaly Nodes - negative Cor - RRR, 2+ radial pulse, No JVD, no carotid bruit Pulm normal Neuro - A&O x 3, speech clear, cognition normal. CN II-XII normal - no fasiculation of the tongue. Sensation  slightly diminished to light touch and pin prick left face vs right face. No palpable muscle fasiculation. Normal gait and balance       Assessment & Plan:  1. Facial twitch - normal exam with no evidence of any strokoid problems. Plan Rule out metabolic causes: lab studies are ordered  If all the labs are normal and the symptoms persist will need a neurology consult.

## 2012-08-22 NOTE — Patient Instructions (Addendum)
Facial twitch - normal exam with no evidence of any strokoid problems. Plan Rule out metabolic causes: lab studies are ordered  If all the labs are normal and the symptoms persist will need a neurology consult.  Health maintenance - orders in for mammography and colonoscopy. Routine labs ordered for today.   Will give tetanus shot today  Blood pressure - excellent control.   Please sign up for MyChart

## 2012-08-23 ENCOUNTER — Other Ambulatory Visit (INDEPENDENT_AMBULATORY_CARE_PROVIDER_SITE_OTHER): Payer: BC Managed Care – PPO

## 2012-08-23 DIAGNOSIS — R259 Unspecified abnormal involuntary movements: Secondary | ICD-10-CM

## 2012-08-23 DIAGNOSIS — R253 Fasciculation: Secondary | ICD-10-CM

## 2012-08-23 DIAGNOSIS — R030 Elevated blood-pressure reading, without diagnosis of hypertension: Secondary | ICD-10-CM

## 2012-08-23 DIAGNOSIS — Z0000 Encounter for general adult medical examination without abnormal findings: Secondary | ICD-10-CM | POA: Insufficient documentation

## 2012-08-23 DIAGNOSIS — T50995A Adverse effect of other drugs, medicaments and biological substances, initial encounter: Secondary | ICD-10-CM

## 2012-08-23 LAB — COMPREHENSIVE METABOLIC PANEL
ALT: 25 U/L (ref 0–35)
AST: 26 U/L (ref 0–37)
Albumin: 4 g/dL (ref 3.5–5.2)
Alkaline Phosphatase: 31 U/L — ABNORMAL LOW (ref 39–117)
BUN: 30 mg/dL — ABNORMAL HIGH (ref 6–23)
CO2: 27 mEq/L (ref 19–32)
Calcium: 9.2 mg/dL (ref 8.4–10.5)
Chloride: 104 mEq/L (ref 96–112)
Creatinine, Ser: 1 mg/dL (ref 0.4–1.2)
GFR: 59.18 mL/min — ABNORMAL LOW (ref >60.00)
Glucose, Bld: 98 mg/dL (ref 70–99)
Potassium: 4 mEq/L (ref 3.5–5.1)
Sodium: 138 mEq/L (ref 135–145)
Total Bilirubin: 0.7 mg/dL (ref 0.3–1.2)
Total Protein: 7 g/dL (ref 6.0–8.3)

## 2012-08-23 LAB — VITAMIN B12: Vitamin B-12: 370 pg/mL (ref 211–911)

## 2012-08-23 LAB — CBC WITH DIFFERENTIAL/PLATELET
Basophils Absolute: 0 10*3/uL (ref 0.0–0.1)
Basophils Relative: 0.3 % (ref 0.0–3.0)
Eosinophils Absolute: 0.2 10*3/uL (ref 0.0–0.7)
Eosinophils Relative: 3.6 % (ref 0.0–5.0)
HCT: 42.9 % (ref 36.0–46.0)
Hemoglobin: 14.6 g/dL (ref 12.0–15.0)
Lymphocytes Relative: 20.4 % (ref 12.0–46.0)
Lymphs Abs: 1.1 10*3/uL (ref 0.7–4.0)
MCHC: 34 g/dL (ref 30.0–36.0)
MCV: 92.1 fl (ref 78.0–100.0)
Monocytes Absolute: 0.5 10*3/uL (ref 0.1–1.0)
Monocytes Relative: 10 % (ref 3.0–12.0)
Neutro Abs: 3.5 10*3/uL (ref 1.4–7.7)
Neutrophils Relative %: 65.7 % (ref 43.0–77.0)
Platelets: 254 10*3/uL (ref 150.0–400.0)
RBC: 4.66 Mil/uL (ref 3.87–5.11)
RDW: 12.3 % (ref 11.5–14.6)
WBC: 5.3 10*3/uL (ref 4.5–10.5)

## 2012-08-23 LAB — SEDIMENTATION RATE: Sed Rate: 14 mm/hr (ref 0–22)

## 2012-08-23 LAB — TSH: TSH: 0.94 u[IU]/mL (ref 0.35–5.50)

## 2012-08-23 NOTE — Assessment & Plan Note (Signed)
Situation is much better at home and her stress/anxiety levels are much lower. She is doing fine.

## 2012-08-23 NOTE — Assessment & Plan Note (Signed)
Last lab in '11: total cholesterol 189, HDL 61, LDL 115 - normal values with no medication. No indication for repeat lab

## 2012-08-23 NOTE — Assessment & Plan Note (Signed)
Stable with no complaints at today's visit.

## 2012-08-23 NOTE — Assessment & Plan Note (Signed)
Interval history significant for facial twitching but otherwise she is doing great. Physical exam - nonfocal; no breast or pelvic exam performed. Reviewed labs - no need to repeat lipids. Cmet, ESR, ANA, B12, TSH pending. She is referred for colonoscopy and she will schedule mammogram. Immunizations are current except she is due for shingles vaccine.   In summary - her general state of health is good. Evaluation of perceived facial twitching is underway.

## 2012-08-23 NOTE — Assessment & Plan Note (Signed)
BP Readings from Last 3 Encounters:  08/22/12 118/68  07/21/11 112/76  05/05/11 120/80   Very normal range blood pressure.

## 2012-08-24 LAB — ANA: Anti Nuclear Antibody(ANA): NEGATIVE

## 2012-08-27 ENCOUNTER — Other Ambulatory Visit: Payer: Self-pay

## 2012-08-30 ENCOUNTER — Encounter: Payer: Self-pay | Admitting: Internal Medicine

## 2012-10-03 ENCOUNTER — Other Ambulatory Visit: Payer: Self-pay | Admitting: Internal Medicine

## 2012-10-03 NOTE — Telephone Encounter (Signed)
Meloxicam request [last Rx 03.05.13 #30x11]/SLS Please advise.

## 2012-10-04 ENCOUNTER — Other Ambulatory Visit: Payer: Self-pay

## 2012-10-04 MED ORDER — MELOXICAM 15 MG PO TABS: 15.0000 mg | ORAL_TABLET | Freq: Every day | ORAL | Status: DC

## 2012-10-12 ENCOUNTER — Ambulatory Visit (AMBULATORY_SURGERY_CENTER): Payer: Medicare HMO | Admitting: *Deleted

## 2012-10-12 VITALS — Ht 61.5 in | Wt 132.8 lb

## 2012-10-12 DIAGNOSIS — Z1211 Encounter for screening for malignant neoplasm of colon: Secondary | ICD-10-CM

## 2012-10-12 DIAGNOSIS — Z86010 Personal history of colonic polyps: Secondary | ICD-10-CM

## 2012-10-12 MED ORDER — MOVIPREP 100 G PO SOLR: ORAL | Status: DC

## 2012-10-13 ENCOUNTER — Encounter: Payer: Self-pay | Admitting: Internal Medicine

## 2012-10-19 ENCOUNTER — Encounter: Payer: BC Managed Care – PPO | Admitting: Internal Medicine

## 2012-10-26 ENCOUNTER — Encounter: Payer: Self-pay | Admitting: Internal Medicine

## 2012-10-26 ENCOUNTER — Ambulatory Visit (AMBULATORY_SURGERY_CENTER): Payer: Medicare HMO | Admitting: Internal Medicine

## 2012-10-26 VITALS — BP 113/67 | HR 68 | Temp 98.5°F | Resp 13 | Ht 61.5 in | Wt 132.0 lb

## 2012-10-26 DIAGNOSIS — Z86010 Personal history of colonic polyps: Secondary | ICD-10-CM

## 2012-10-26 DIAGNOSIS — Z1211 Encounter for screening for malignant neoplasm of colon: Secondary | ICD-10-CM

## 2012-10-26 DIAGNOSIS — D126 Benign neoplasm of colon, unspecified: Secondary | ICD-10-CM

## 2012-10-26 MED ORDER — SODIUM CHLORIDE 0.9 % IV SOLN: 500.0000 mL | INTRAVENOUS | Status: DC

## 2012-10-26 NOTE — Progress Notes (Signed)
Patient did not experience any of the following events: a burn prior to discharge; a fall within the facility; wrong site/side/patient/procedure/implant event; or a hospital transfer or hospital admission upon discharge from the facility. (G8907) Patient did not have preoperative order for IV antibiotic SSI prophylaxis. (G8918)  

## 2012-10-26 NOTE — Patient Instructions (Addendum)
Handout was given to your care partner on polyps, diverticulosis and high fiber diet.  You may resume your current medications today.Per Dr. Juanda Chance add soluable fiber 1 tablespoon daily.  Please call if any questions or concerns.     YOU HAD AN ENDOSCOPIC PROCEDURE TODAY AT THE [facility]: Refer to the procedure report that was given to you for any specific questions about what was found during the examination.  If the procedure report does not answer your questions, please call your gastroenterologist to clarify.  If you requested that your care partner not be given the details of your procedure findings, then the procedure report has been included in a sealed envelope for you to review at your convenience later.  YOU SHOULD EXPECT: Some feelings of bloating in the abdomen. Passage of more gas than usual.  Walking can help get rid of the air that was put into your GI tract during the procedure and reduce the bloating. If you had a lower endoscopy (such as a colonoscopy or flexible sigmoidoscopy) you may notice spotting of blood in your stool or on the toilet paper. If you underwent a bowel prep for your procedure, then you may not have a normal bowel movement for a few days.  DIET: Your first meal following the procedure should be a light meal and then it is ok to progress to your normal diet.  A half-sandwich or bowl of soup is an example of a good first meal.  Heavy or fried foods are harder to digest and may make you feel nauseous or bloated.  Likewise meals heavy in dairy and vegetables can cause extra gas to form and this can also increase the bloating.  Drink plenty of fluids but you should avoid alcoholic beverages for 24 hours.  ACTIVITY: Your care partner should take you home directly after the procedure.  You should plan to take it easy, moving slowly for the rest of the day.  You can resume normal activity the day after the procedure however you should NOT DRIVE or use heavy  machinery for 24 hours (because of the sedation medicines used during the test).    SYMPTOMS TO REPORT IMMEDIATELY: A gastroenterologist can be reached at any hour.  During normal business hours, 8:30 AM to 5:00 PM Monday through Friday, call [phone].  After hours and on weekends, please call the GI answering service at [phone] who will take a message and have the physician on call contact you.   Following lower endoscopy (colonoscopy or flexible sigmoidoscopy):  Excessive amounts of blood in the stool  Significant tenderness or worsening of abdominal pains  Swelling of the abdomen that is new, acute  Fever of 100F or higher   FOLLOW UP: If any biopsies were taken you will be contacted by phone or by letter within the next 1-3 weeks.  Call your gastroenterologist if you have not heard about the biopsies in 3 weeks.  Our staff will call the home number listed on your records the next business day following your procedure to check on you and address any questions or concerns that you may have at that time regarding the information given to you following your procedure. This is a courtesy call and so if there is no answer at the home number and we have not heard from you through the emergency physician on call, we will assume that you have returned to your regular daily activities without incident.  SIGNATURES/CONFIDENTIALITY: You and/or your care partner  have signed paperwork which will be entered into your electronic medical record.  These signatures attest to the fact that that the information above on your After Visit Summary has been reviewed and is understood.  Full responsibility of the confidentiality of this discharge information lies with you and/or your care-partner.

## 2012-10-26 NOTE — Progress Notes (Signed)
Called to room to assist during endoscopic procedure.  Patient ID and intended procedure confirmed with present staff. Received instructions for my participation in the procedure from the performing physician.  

## 2012-10-26 NOTE — Progress Notes (Signed)
No complaints noted in the recovery room. Maw   

## 2012-10-26 NOTE — Progress Notes (Signed)
Lidocaine-40mg IV prior to Propofol InductionPropofol given over incremental dosages 

## 2012-10-26 NOTE — Op Note (Signed)
[facility] [address] [place], [address]   COLONOSCOPY PROCEDURE REPORT  PATIENT: [name], [name]  MR#: [id number] BIRTHDATE: 1946/07/08 , 66  yrs. old GENDER: Female ENDOSCOPIST: Hart Carwin, MD REFERRED BY:  recall colonoscopy PROCEDURE DATE:  10/26/2012 PROCEDURE:   Colonoscopy with cold biopsy polypectomy ASA CLASS:   Class II INDICATIONS:2008 colonoscopy - adenom and hyperplastic polyps. , hx of right colon resection 30 years ago for acute " peritonitis" MEDICATIONS: MAC sedation, administered by CRNA and propofol (Diprivan) 200mg  IV  DESCRIPTION OF PROCEDURE:   After the risks and benefits and of the procedure were explained, informed consent was obtained.        The LB [id number]  endoscope was introduced through the anus and advanced to the surgical anastomosis .  The quality of the prep was good, using MoviPrep .  The instrument was then slowly withdrawn as the colon was fully examined.     COLON FINDINGS: Five smooth sessile polyps ranging between 3-28mm in size were found in the rectum.  A polypectomy was performed with cold forceps.  The resection was complete and the polyp tissue was completely retrieved.   There was severe diverticulosis noted in the sigmoid colon with associated tortuosity, muscular hypertrophy and colonic narrowing.   There was evidence of a prior end-to-side ileocolonic surgical anastomosis.     Retroflexed views revealed no abnormalities.  several polyps were removed from the retroflexed position of the scope   The scope was then withdrawn from the patient and the procedure completed.  COMPLICATIONS: There were no complications. ENDOSCOPIC IMPRESSION: 1.   Five sessile polyps ranging between 3-47mm in size were found in the rectum; polypectomy was performed with cold forceps 2.   There was severe diverticulosis noted in the sigmoid colon 3. normal appearing ileo-colic anastomosis  RECOMMENDATIONS: 1.   Await pathology results 2.  High fiber diet 3. add soluable fiber 1 tablesp daily   REPEAT EXAM: for Colonoscopy, pending biopsy results.  cc:  _______________________________ eSignedHart Carwin, MD 10/26/2012 9:12 AM     PATIENT NAME:  [name], [name] MR#: [id number]

## 2012-10-26 NOTE — Progress Notes (Signed)
NO EGG OR SOY ALLERGY. EWM 

## 2012-10-27 ENCOUNTER — Telehealth: Payer: Self-pay

## 2012-10-27 NOTE — Telephone Encounter (Signed)
  Follow up Call-  Call back number 10/26/2012  Post procedure Call Back phone  # [phone]  Permission to leave phone message Yes     Patient questions:  Do you have a fever, pain , or abdominal swelling? no Pain Score  0 *  Have you tolerated food without any problems? yes  Have you been able to return to your normal activities? yes  Do you have any questions about your discharge instructions: Diet   no Medications  no Follow up visit  no  Do you have questions or concerns about your Care? no  Actions: * If pain score is 4 or above: No action needed, pain <4.

## 2012-11-01 ENCOUNTER — Encounter: Payer: Self-pay | Admitting: Internal Medicine

## 2012-11-22 ENCOUNTER — Encounter: Payer: Self-pay | Admitting: Internal Medicine

## 2012-12-19 ENCOUNTER — Telehealth: Payer: Self-pay

## 2012-12-19 ENCOUNTER — Encounter: Payer: Self-pay | Admitting: Internal Medicine

## 2012-12-19 DIAGNOSIS — G514 Facial myokymia: Secondary | ICD-10-CM

## 2012-12-19 NOTE — Telephone Encounter (Signed)
[facility] advising per MD. 

## 2012-12-19 NOTE — Telephone Encounter (Signed)
Ok for neurology referral - [facility] notified

## 2012-12-19 NOTE — Telephone Encounter (Signed)
Patient called [facility] c/o continued "facial twitching" that has increased since last office visit. Pt would like to request a referral to see a neurology within the [facility]. Thanks

## 2012-12-23 ENCOUNTER — Other Ambulatory Visit: Payer: Self-pay | Admitting: Internal Medicine

## 2013-01-08 ENCOUNTER — Other Ambulatory Visit: Payer: Self-pay | Admitting: Internal Medicine

## 2013-01-09 ENCOUNTER — Other Ambulatory Visit: Payer: Self-pay | Admitting: Internal Medicine

## 2013-01-09 NOTE — Telephone Encounter (Signed)
Alprazolam called to pharmacy  

## 2013-01-10 ENCOUNTER — Ambulatory Visit (INDEPENDENT_AMBULATORY_CARE_PROVIDER_SITE_OTHER): Payer: Medicare HMO | Admitting: Neurology

## 2013-01-10 ENCOUNTER — Telehealth: Payer: Self-pay

## 2013-01-10 ENCOUNTER — Encounter: Payer: Self-pay | Admitting: Neurology

## 2013-01-10 VITALS — BP 104/70 | HR 80 | Temp 97.7°F | Resp 18 | Wt 130.0 lb

## 2013-01-10 DIAGNOSIS — G252 Other specified forms of tremor: Secondary | ICD-10-CM

## 2013-01-10 DIAGNOSIS — G5139 Clonic hemifacial spasm, unspecified: Secondary | ICD-10-CM | POA: Insufficient documentation

## 2013-01-10 DIAGNOSIS — G518 Other disorders of facial nerve: Secondary | ICD-10-CM

## 2013-01-10 DIAGNOSIS — G250 Essential tremor: Secondary | ICD-10-CM

## 2013-01-10 LAB — BASIC METABOLIC PANEL
BUN: 24 mg/dL — ABNORMAL HIGH (ref 6–23)
CO2: 31 mEq/L (ref 19–32)
Calcium: 9.4 mg/dL (ref 8.4–10.5)
Chloride: 101 mEq/L (ref 96–112)
Creatinine, Ser: 1.1 mg/dL (ref 0.4–1.2)
GFR: 52.95 mL/min — ABNORMAL LOW (ref >60.00)
Glucose, Bld: 94 mg/dL (ref 70–99)
Potassium: 4.1 mEq/L (ref 3.5–5.1)
Sodium: 138 mEq/L (ref 135–145)

## 2013-01-10 NOTE — Patient Instructions (Addendum)
Your MRI is scheduled for Thursday, July 3rd at 12:00 noon.  Please arrive to [facility], first floor admitting by 11:45am  [phone].  We will start the authorization process on the Botox.

## 2013-01-10 NOTE — Progress Notes (Signed)
[name] was seen today in neurologic consultation at the request of Tanya Regulus, MD.  The consultation is for the evaluation of facial twitching.  The pt reports that it has been going on intermittently for about a year.  It is not continuous.  It seems to cluster and go for 3-4 days and then stop.  It is always on the L side of the face.  The longest it goes away is 3-4 days.  She notices no initiating factors.  She does admit to a tremor in the hands for the last 3-4 years.  There is an internal tremor as well.  Her mother had a similar shake.  Because of this, she has trouble writing (shaking, not micrographia) and has trouble putting on her makeup.  She does not drink caffeine or alcohol so does not know affect on tremor.  She has had no change in balance.  She has no voice changes.  She has intermittent congenital strabismus and rarely gets diplopia because of this.  Her bladder is under control but she has frequent urination and has bowel urgency (hx of IBS).    The patient does bring in a video today of her symptoms, since they are intermittent.  We reviewed this together.   Neuroimaging has not previously been performed.      PREVIOUS MEDICATIONS: Not applicable  ALLERGIES:   Allergies  Allergen Reactions  . Codeine Nausea And Vomiting  . Iodine Swelling    Throat swelling  . Shellfish Allergy Swelling    Throat swells    CURRENT MEDICATIONS:  Current Outpatient Prescriptions on File Prior to Visit  Medication Sig Dispense Refill  . ALPRAZolam (XANAX) 0.5 MG tablet TAKE ONE TABLET BY MOUTH TWICE DAILY.  60 tablet  0  . FLUoxetine (PROZAC) 40 MG capsule TAKE ONE CAPSULE BY MOUTH EVERY DAY  30 capsule  4  . meloxicam (MOBIC) 15 MG tablet TAKE ONE TABLET BY MOUTH DAILY. DUE FOR PHYSICAL.  30 tablet  0  . MULTIPLE VITAMIN PO Take 1 tablet by mouth daily.       . SUMAtriptan (IMITREX) 6 MG/0.5ML SOLN injection Inject 0.5 mLs (6 mg total) into the skin once. Subcutaneously  injection as needed migraine  1 vial  3  . traZODone (DESYREL) 50 MG tablet Take 1 tablet (50 mg total) by mouth at bedtime. MAY TAKE ONLY AS NEEDED PER DR. NORINS  30 tablet  5  . vitamin B-12 (CYANOCOBALAMIN) 500 MCG tablet Take 1,500 mcg by mouth daily.      Marland Kitchen alendronate (FOSAMAX) 70 MG tablet Take 70 mg by mouth every 7 (seven) days. Take with a full glass of water on an empty stomach.       . fish oil-omega-3 fatty acids 1000 MG capsule Take 1 g by mouth daily.      Marland Kitchen GREEN COFFEE BEAN PO Take 1 tablet by mouth daily.       No current facility-administered medications on file prior to visit.    PAST MEDICAL HISTORY:   Past Medical History  Diagnosis Date  . HYPERCHOLESTEROLEMIA 03/16/2008  . ANXIETY DEPRESSION 07/25/2009  . COMMON MIGRAINE 03/16/2008  . ALLERGIC RHINITIS CAUSE UNSPECIFIED 11/12/2009  . Irritable bowel syndrome 11/12/2009  . ALOPECIA 03/16/2008  . LOW BACK PAIN, CHRONIC 07/19/2009  . OSTEOPOROSIS 07/08/2010  . CHICKENPOX, HX OF 03/16/2008  . DIVERTICULITIS, HX OF 03/16/2008  . HYPERGLYCEMIA 03/16/2008  . Anxiety     PAST SURGICAL HISTORY:   Past  Surgical History  Procedure Laterality Date  . Partial colectomy      in her 20's  . Correction of esotropic  June 06', May '10  . Cholecystectomy    . Arm lift  09-23-12    SOCIAL HISTORY:   History   Social History  . Marital Status: Married    Spouse Name: N/A    Number of Children: N/A  . Years of Education: N/A   Occupational History  . Retired Printmaker)     Social History Main Topics  . Smoking status: Former Smoker    Start date: 01/10/2010  . Smokeless tobacco: Never Used  . Alcohol Use: No  . Drug Use: No  . Sexually Active: Yes -- Female partner(s)   Other Topics Concern  . 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   Marriage in good health.      Physician roster   Gyn- Rock Nephew, NP   Opthal- Dr Maple Hudson (pediatric opthal)   GI- dr Tommye Standard- Dr Darrelyn Hillock   ENT- Dr Haroldine Laws             FAMILY HISTORY:   Family Status  Relation Status Death Age  . Mother Deceased 44  . Father Deceased 2    ROS:  A complete 10 system review of systems was obtained and was unremarkable apart from what is mentioned above.  PHYSICAL EXAMINATION:    VITALS:   Filed Vitals:   01/10/13 0844  BP: 104/70  Pulse: 80  Temp: 97.7 F (36.5 C)  Resp: 18  Weight: 130 lb (58.968 kg)    GEN:  Normal appears female in no acute distress.  Appears stated age. HEENT:  Normocephalic, atraumatic. The mucous membranes are moist. The superficial temporal arteries are without ropiness or tenderness. Cardiovascular: Regular rate and rhythm. Lungs: Clear to auscultation bilaterally. Neck/Heme: There are no carotid bruits noted bilaterally.  NEUROLOGICAL: Orientation:  The patient is alert and oriented x 3.  Fund of knowledge is appropriate.  Recent and remote memory intact.  Attention span and concentration normal.  Repeats and names without difficulty. Cranial nerves: There is good facial symmetry with very mild decreased nasolabial fold on the left.  The pupils are equal round and reactive to light bilaterally. Fundoscopic exam reveals clear disc margins bilaterally. Extraocular muscles are intact and visual fields are full to confrontational testing. Speech is fluent and clear. Soft palate rises symmetrically and there is no tongue deviation. Hearing is intact to conversational tone. Tone: Tone is good throughout. Sensation: Sensation is intact to light touch and pinprick throughout (facial, trunk, extremities). Vibration is intact at the bilateral big toe. There is no extinction with double simultaneous stimulation. There is no sensory dermatomal level identified. Coordination:  The patient has no difficulty  with RAM's or FNF bilaterally. Motor: Strength is 5/5 in the bilateral upper and lower extremities.  Shoulder shrug is equal and symmetric. There is no pronator drift.  There are no fasciculations noted. DTR's: Deep tendon reflexes are 2/4 at the bilateral biceps, triceps, brachioradialis, patella and achilles.  Plantar responses are downgoing bilaterally. Gait and Station: The patient is able to ambulate without difficulty. The patient is able to heel toe walk without any difficulty. The patient is able to ambulate in  a tandem fashion. The patient is able to stand in the Romberg position. ABNL MOVEMNENTS:  There is intermittent left hemi-facial spasm.  This involves the chin, left zygomaticus major and left eye.  There is mild tremor of the outstretched hands.  She has no difficulty with Archimedes spirals.  She has a very minor, intermittent  head titubation in the "no" direction.   IMPRESSION/PLAN  1. left hemifacial spasm.  -We discussed the diagnosis as well as pathophysiology of the disease.  We discussed treatment options as well as prognostic indicators.  Patient education was provided.  -We will do an MRI of the brain with attention to the left facial nerve to make sure that there is nothing on it irritating it.  - Presuming the left facial nerve looks okay, we discussed the use of Botox.  The patient was educated on the botulinum toxin the black blox warning and given a copy of the botox patient medication guide.  The patient understands that this warning states that there have been reported cases of the Botox extending beyond the injection site and creating adverse effects, similar to those of botulism. This included loss of strength, trouble walking, hoarseness, trouble saying words clearly, loss of bladder control, trouble breathing, trouble swallowing, diplopia, blurry vision and ptosis. Most of the distant spread of Botox was happening in patients, primarily children, who received  medication for spasticity or for cervical dystonia. The patient expressed understanding and desire to proceed.  I talked to her about the fact that she cannot receive Botox for other conditions, including wrinkles, more than every 3 months so if she wishes to continue with that, she would need to get the injections the same day as her injections here or the risk of Ab development is much higher. 2. mild essential tremor.  -This is very mild in the hands and rarely in the head as well.  Her mother had a tremor.  I would not recommend treating this at this time unless it becomes more bothersome. 3.  I will f/u with her after the above is completed, hopefully to proceed with injections.

## 2013-01-10 NOTE — Telephone Encounter (Signed)
Alprazolam called to [facility] pharmacy

## 2013-01-10 NOTE — Telephone Encounter (Signed)
Think that she most definitely needs it done.  What about the very open unit at [facility]?

## 2013-01-10 NOTE — Telephone Encounter (Signed)
Pt was thinking about the MRI and is a bit anxious now to have it done.  She wants to know if you feel strongly about her having it and if so she would need meds before hand for claustrophobia.

## 2013-01-11 ENCOUNTER — Other Ambulatory Visit: Payer: Self-pay | Admitting: *Deleted

## 2013-01-11 MED ORDER — ALENDRONATE SODIUM 70 MG PO TABS: 70.0000 mg | ORAL_TABLET | ORAL | Status: DC

## 2013-01-11 NOTE — Telephone Encounter (Signed)
Pt rescheduled for [facility] tomorrow at 3.  She is aware.

## 2013-01-11 NOTE — Telephone Encounter (Signed)
Pt called requesting a refill for Fosamax.  Refill sent to pharmacy.

## 2013-01-12 ENCOUNTER — Ambulatory Visit (HOSPITAL_COMMUNITY): Payer: Medicare HMO

## 2013-01-16 ENCOUNTER — Telehealth: Payer: Self-pay | Admitting: Neurology

## 2013-01-16 NOTE — Telephone Encounter (Deleted)
Hello, [name].  Wanted to let you know that your MRI of the brain looked great!  Have a good day!

## 2013-01-16 NOTE — Telephone Encounter (Signed)
MRI was normal.

## 2013-02-10 ENCOUNTER — Other Ambulatory Visit: Payer: Self-pay | Admitting: Internal Medicine

## 2013-02-13 ENCOUNTER — Other Ambulatory Visit: Payer: Self-pay | Admitting: Internal Medicine

## 2013-02-13 NOTE — Telephone Encounter (Signed)
Alprazolam called to pharmacy  

## 2013-02-15 ENCOUNTER — Other Ambulatory Visit: Payer: Self-pay

## 2013-02-16 ENCOUNTER — Encounter: Payer: Self-pay | Admitting: Neurology

## 2013-02-20 ENCOUNTER — Other Ambulatory Visit: Payer: Self-pay | Admitting: Internal Medicine

## 2013-02-23 ENCOUNTER — Other Ambulatory Visit: Payer: Self-pay | Admitting: Internal Medicine

## 2013-02-24 ENCOUNTER — Ambulatory Visit (INDEPENDENT_AMBULATORY_CARE_PROVIDER_SITE_OTHER): Payer: Medicare HMO | Admitting: Neurology

## 2013-02-24 ENCOUNTER — Encounter: Payer: Self-pay | Admitting: Neurology

## 2013-02-24 VITALS — BP 118/80 | HR 76 | Temp 97.7°F | Resp 18 | Wt 130.0 lb

## 2013-02-24 DIAGNOSIS — G5139 Clonic hemifacial spasm, unspecified: Secondary | ICD-10-CM

## 2013-02-24 DIAGNOSIS — G518 Other disorders of facial nerve: Secondary | ICD-10-CM

## 2013-02-24 MED ORDER — ONABOTULINUMTOXINA 100 UNITS IJ SOLR
100.0000 [IU] | Freq: Once | INTRAMUSCULAR | Status: AC
  Administered 2013-02-24: 100 [IU] via INTRAMUSCULAR

## 2013-02-24 NOTE — Progress Notes (Signed)
Pt here for Botox.  Procedural note completed

## 2013-02-24 NOTE — Procedures (Signed)
[facility]   History:  Diagnosis: Hemifacial spasm (351.8)  Initial side: left   Result History  Onset of effect: Not applicable  Duration of Benefit: Not applicable Adverse Effects: Not applicable   Consent obtained from: The patient Benefits discussed included, but were not limited to decreased muscle tightness, increased joint range of motion, and decreased pain.  Risk discussed included, but were not limited pain and discomfort, bleeding, bruising, excessive weakness, venous thrombosis, muscle atrophy and dysphagia.  A copy of the patient medication guide was given to the patient which explains the blackbox warning.  Patients identity and treatment sites confirmed yes.  Details of Procedure: Skin was cleaned with alcohol.   Prior to injection, the needle plunger was aspirated to make sure the needle was not within a blood vessel.  There was no blood retrieved on aspiration.    Following is a summary of the muscles injected  And the amount of Botulinum toxin used:  Injections  Location Left  Right Units Number of sites        Corrugator      Frontalis      Lower Lid, Lateral 2.5   2.5    Lower Lid Medial  2.5    2.5    Upper Lid, Lateral 2.5    2.5   Upper Lid, Medial 2.5  2.5   Canthus 5.0  5.0   Nasalis 5.0  5.0   Masseter      Procerus      Zygomaticus Major 2.5 + 2.5  5.0   TOTAL UNITS:   25    Agent: Botulinum Type A ( Onobotulinum Toxin type A ).  1 vials of Botox were used, each containing 50 units and freshly diluted with 2 mL of sterile, non-perserved saline   Total injected (Units): 25  Total wasted (Units): 25 Pt tolerated procedure well without complications.   Reinjection is anticipated in 3 months.

## 2013-02-24 NOTE — Addendum Note (Signed)
Addended by: Lelon Huh on: 02/24/2013 12:00 PM   Modules accepted: Orders

## 2013-03-20 ENCOUNTER — Other Ambulatory Visit: Payer: Self-pay | Admitting: Internal Medicine

## 2013-03-20 NOTE — Telephone Encounter (Signed)
Alprazolam called to pharmacy  

## 2013-03-20 NOTE — Telephone Encounter (Signed)
Already did this, I thought

## 2013-03-21 ENCOUNTER — Other Ambulatory Visit: Payer: Self-pay | Admitting: Internal Medicine

## 2013-03-21 ENCOUNTER — Other Ambulatory Visit: Payer: Self-pay

## 2013-03-21 MED ORDER — MELOXICAM 15 MG PO TABS: 15.0000 mg | ORAL_TABLET | Freq: Every day | ORAL | Status: DC

## 2013-04-07 ENCOUNTER — Ambulatory Visit (INDEPENDENT_AMBULATORY_CARE_PROVIDER_SITE_OTHER): Payer: Medicare HMO | Admitting: Neurology

## 2013-04-07 VITALS — BP 120/80 | HR 76 | Temp 98.1°F | Resp 16 | Wt 131.7 lb

## 2013-04-07 DIAGNOSIS — G518 Other disorders of facial nerve: Secondary | ICD-10-CM

## 2013-04-07 DIAGNOSIS — G5139 Clonic hemifacial spasm, unspecified: Secondary | ICD-10-CM

## 2013-04-07 NOTE — Progress Notes (Signed)
[name] was seen today in neurologic consultation at the request of [name], MD.  The consultation is for the evaluation of facial twitching.  The pt reports that it has been going on intermittently for about a year.  It is not continuous.  It seems to cluster and go for 3-4 days and then stop.  It is always on the L side of the face.  The longest it goes away is 3-4 days.  She notices no initiating factors.  She does admit to a tremor in the hands for the last 3-4 years.  There is an internal tremor as well.  Her mother had a similar shake.  Because of this, she has trouble writing (shaking, not micrographia) and has trouble putting on her makeup.  She does not drink caffeine or alcohol so does not know affect on tremor.  She has had no change in balance.  She has no voice changes.  She has intermittent congenital strabismus and rarely gets diplopia because of this.  Her bladder is under control but she has frequent urination and has bowel urgency (hx of IBS).    The patient does bring in a video today of her symptoms, since they are intermittent.  We reviewed this together.  04/07/13 update: The patient has a history of left hemifacial spasm.  She underwent her first Botox injections on 02/24/2013.  She was pleased with efficacy of the injections.  The botox seem to take he can almost immediately.  She has, however, noticed some left facial droop.  This has not been particularly bothersome, but it was definitely noticeable.  She has very rare twitching over the left chin.   Neuroimaging was performed on 02/08/2013.  This was a negative MRI of the brain.  I reviewed this with her.      PREVIOUS MEDICATIONS: Not applicable  ALLERGIES:   Allergies  Allergen Reactions  . Codeine Nausea And Vomiting  . Iodine Swelling    Throat swelling  . Shellfish Allergy Swelling    Throat swells    CURRENT MEDICATIONS:  Current Outpatient Prescriptions on File Prior to Visit  Medication Sig  Dispense Refill  . alendronate (FOSAMAX) 70 MG tablet TAKE ONE TABLET BY MOUTH ONCE A WEEK WITH A FULL GLASS OF WATER ON AN EMPTY STOMACH.  12 tablet  0  . ALPRAZolam (XANAX) 0.5 MG tablet TAKE ONE TABLET BY MOUTH TWICE DAILY  60 tablet  0  . fish oil-omega-3 fatty acids 1000 MG capsule Take 1 g by mouth daily.      Marland Kitchen FLUoxetine (PROZAC) 40 MG capsule TAKE ONE CAPSULE BY MOUTH EVERY DAY  30 capsule  5  . GREEN COFFEE BEAN PO Take 1 tablet by mouth daily.      . meloxicam (MOBIC) 15 MG tablet Take 1 tablet (15 mg total) by mouth daily.  30 tablet  0  . MULTIPLE VITAMIN PO Take 1 tablet by mouth daily.       . SUMAtriptan (IMITREX) 6 MG/0.5ML SOLN injection Inject 0.5 mLs (6 mg total) into the skin once. Subcutaneously injection as needed migraine  1 vial  3  . traZODone (DESYREL) 50 MG tablet TAKE 1 TABLET BY MOUTH AT BEDTIME (QUALITEST BRAND ONLY [phone])  30 tablet  5  . vitamin B-12 (CYANOCOBALAMIN) 500 MCG tablet Take 1,500 mcg by mouth daily.       No current facility-administered medications on file prior to visit.    PAST MEDICAL HISTORY:   Past  Medical History  Diagnosis Date  . HYPERCHOLESTEROLEMIA 03/16/2008    01/10/13;  Pt denies  . ANXIETY DEPRESSION 07/25/2009  . COMMON MIGRAINE 03/16/2008    01/10/13:  resolved  . ALLERGIC RHINITIS CAUSE UNSPECIFIED 11/12/2009  . Irritable bowel syndrome 11/12/2009  . ALOPECIA 03/16/2008  . LOW BACK PAIN, CHRONIC 07/19/2009  . OSTEOPOROSIS 07/08/2010  . CHICKENPOX, HX OF 03/16/2008  . DIVERTICULITIS, HX OF 03/16/2008  . HYPERGLYCEMIA 03/16/2008  . Anxiety     PAST SURGICAL HISTORY:   Past Surgical History  Procedure Laterality Date  . Partial colectomy      in her 20's  . Correction of esotropic  June 06', May '10  . Cholecystectomy    . Arm lift  09-23-12    SOCIAL HISTORY:   History   Social History  . Marital Status: Married    Spouse Name: N/A    Number of Children: N/A  . Years of Education: N/A   Occupational History  . Retired  Printmaker)     Social History Main Topics  . Smoking status: Former Smoker    Start date: 01/10/2010  . Smokeless tobacco: Never Used  . Alcohol Use: No  . Drug Use: No  . Sexual Activity: Yes    Partners: Female   Other Topics Concern  . 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   Marriage in good health.      Physician roster   Gyn- Rock Nephew, NP   Opthal- Dr Maple Hudson (pediatric opthal)   GI- dr Tommye Standard- Dr Darrelyn Hillock   ENT- Dr Haroldine Laws             FAMILY HISTORY:   Family Status  Relation Status Death Age  . Mother Deceased 45    CAD  . Father Deceased 65    CAD  . Child Alive     2, healthy    ROS:  A complete 10 system review of systems was obtained and was unremarkable apart from what is mentioned above.  PHYSICAL EXAMINATION:    VITALS:   There were no vitals filed for this visit.  GEN:  Normal appears female in no acute distress.  Appears stated age. HEENT:  Normocephalic, atraumatic. The mucous membranes are moist. The superficial temporal arteries are without ropiness or tenderness. Cardiovascular: Regular rate and rhythm. Lungs: Clear to auscultation bilaterally. Neck/Heme: There are no carotid bruits noted bilaterally.  NEUROLOGICAL: Orientation:  The patient is alert and oriented x 3.  Fund of knowledge is appropriate.  Recent and remote memory intact.  Attention span and concentration normal.  Repeats and names without difficulty. Cranial nerves: There is more pronounced decrease in nasolabial fold on the left today than last visit.  She is able to close both eyes and blink, but she cannot squeeze the eye attenuation in the left.  She is able to hold air in the cheeks symmetrically.  Forehead wrinkle is symmetric.  The pupils are equal round and reactive  to light bilaterally. Fundoscopic exam reveals clear disc margins bilaterally. Extraocular muscles are intact and visual fields are full to confrontational testing. Speech is fluent and clear. Soft palate rises symmetrically and there is no tongue deviation. Hearing is intact to conversational tone. Tone: Tone is good throughout. Sensation: Sensation  is intact to light touch throughout Coordination:  The patient has no difficulty with RAM's or FNF bilaterally. Motor: Strength is 5/5 in the bilateral upper and lower extremities.  Shoulder shrug is equal and symmetric. There is no pronator drift.  There are no fasciculations noted. DTR's: Deep tendon reflexes are not tested today Gait and Station: The patient is able to ambulate without difficulty.  ABNL MOVEMNENTS:  There is no hemifacial spasm noted today.   IMPRESSION/PLAN  1. left hemifacial spasm.  -She has virtually no hemifacial spasm, but does have an increase in the facial asymmetry (had some documented at baseline) since Botox.  Next visit, I will eliminate the zygomaticus and potentially will eliminate the left nasalis as well. 2. mild essential tremor.  -This is very mild in the hands and rarely in the head as well.  Her mother had a tremor.  I would not recommend treating this at this time unless it becomes more bothersome. 3.  I will f/u with her at her next series of injections, which she would like to proceed with.Marland Kitchen

## 2013-04-08 ENCOUNTER — Other Ambulatory Visit: Payer: Self-pay | Admitting: Internal Medicine

## 2013-04-10 ENCOUNTER — Other Ambulatory Visit: Payer: Self-pay | Admitting: Internal Medicine

## 2013-04-10 NOTE — Telephone Encounter (Signed)
Alprazolam called to pharmacy  

## 2013-04-12 ENCOUNTER — Other Ambulatory Visit: Payer: Self-pay | Admitting: Internal Medicine

## 2013-04-17 ENCOUNTER — Other Ambulatory Visit: Payer: Self-pay | Admitting: Internal Medicine

## 2013-05-01 ENCOUNTER — Telehealth: Payer: Self-pay | Admitting: Neurology

## 2013-05-02 NOTE — Telephone Encounter (Signed)
Called pt back but she only wants to speak with Dr.Tat when she returns to the office at the end of the month.

## 2013-05-02 NOTE — Telephone Encounter (Signed)
Sarah please call pt and see what her concern is since I am out of the office for the next week.  If she wants to hold on next botox in December, then she certainly can.

## 2013-05-17 ENCOUNTER — Other Ambulatory Visit: Payer: Self-pay | Admitting: Internal Medicine

## 2013-05-18 ENCOUNTER — Other Ambulatory Visit: Payer: Self-pay

## 2013-05-18 ENCOUNTER — Telehealth: Payer: Self-pay | Admitting: Neurology

## 2013-05-18 NOTE — Telephone Encounter (Signed)
Pt cancelled appt for Botox inj 05/26/13-She did not wish to r/s / Roanna Raider

## 2013-05-18 NOTE — Telephone Encounter (Signed)
Noted  

## 2013-05-26 ENCOUNTER — Ambulatory Visit: Payer: Medicare HMO | Admitting: Neurology

## 2013-06-02 ENCOUNTER — Ambulatory Visit: Payer: Medicare HMO | Admitting: Neurology

## 2013-06-09 ENCOUNTER — Other Ambulatory Visit: Payer: Self-pay | Admitting: Internal Medicine

## 2013-06-09 NOTE — Telephone Encounter (Signed)
Alprazolam called to pharmacy  

## 2013-07-18 ENCOUNTER — Other Ambulatory Visit: Payer: Self-pay | Admitting: Internal Medicine

## 2013-07-19 ENCOUNTER — Other Ambulatory Visit: Payer: Self-pay | Admitting: Internal Medicine

## 2013-07-31 ENCOUNTER — Other Ambulatory Visit: Payer: Self-pay | Admitting: Internal Medicine

## 2013-08-04 ENCOUNTER — Other Ambulatory Visit: Payer: Self-pay | Admitting: Internal Medicine

## 2013-08-19 ENCOUNTER — Other Ambulatory Visit: Payer: Self-pay | Admitting: Internal Medicine

## 2013-08-21 ENCOUNTER — Other Ambulatory Visit: Payer: Self-pay | Admitting: Internal Medicine

## 2013-09-14 ENCOUNTER — Other Ambulatory Visit: Payer: Self-pay | Admitting: Internal Medicine

## 2013-09-14 NOTE — Telephone Encounter (Signed)
Ok with 5 refills 

## 2013-09-16 ENCOUNTER — Other Ambulatory Visit: Payer: Self-pay | Admitting: Internal Medicine

## 2013-11-23 ENCOUNTER — Ambulatory Visit (INDEPENDENT_AMBULATORY_CARE_PROVIDER_SITE_OTHER): Payer: Medicare HMO | Admitting: Neurology

## 2013-11-23 ENCOUNTER — Encounter: Payer: Self-pay | Admitting: Neurology

## 2013-11-23 VITALS — BP 92/58 | HR 68 | Resp 14 | Ht 62.0 in | Wt 133.0 lb

## 2013-11-23 DIAGNOSIS — G5139 Clonic hemifacial spasm, unspecified: Secondary | ICD-10-CM

## 2013-11-23 DIAGNOSIS — H532 Diplopia: Secondary | ICD-10-CM

## 2013-11-23 DIAGNOSIS — G252 Other specified forms of tremor: Secondary | ICD-10-CM

## 2013-11-23 DIAGNOSIS — R251 Tremor, unspecified: Secondary | ICD-10-CM

## 2013-11-23 DIAGNOSIS — G518 Other disorders of facial nerve: Secondary | ICD-10-CM

## 2013-11-23 DIAGNOSIS — G250 Essential tremor: Secondary | ICD-10-CM

## 2013-11-23 DIAGNOSIS — R259 Unspecified abnormal involuntary movements: Secondary | ICD-10-CM

## 2013-11-23 NOTE — Patient Instructions (Signed)
1. Your provider has requested that you have labwork completed today. Please go to [facility] on the first floor of this building before leaving the office today. 2. We are referring you to [facility] for a DaT scan. They will contact you directly to set up a time for this testing. If you do not hear from them they can be contacted at [phone]. 3. Follow up after testing complete.

## 2013-11-23 NOTE — Progress Notes (Signed)
[name] was seen today in neurologic consultation at the request of Gerrit Heck, MD.  The consultation is for the evaluation of facial twitching.  The pt reports that it has been going on intermittently for about a year.  It is not continuous.  It seems to cluster and go for 3-4 days and then stop.  It is always on the L side of the face.  The longest it goes away is 3-4 days.  She notices no initiating factors.  She does admit to a tremor in the hands for the last 3-4 years.  There is an internal tremor as well.  Her mother had a similar shake.  Because of this, she has trouble writing (shaking, not micrographia) and has trouble putting on her makeup.  She does not drink caffeine or alcohol so does not know affect on tremor.  She has had no change in balance.  She has no voice changes.  She has intermittent congenital strabismus and rarely gets diplopia because of this.  Her bladder is under control but she has frequent urination and has bowel urgency (hx of IBS).    The patient does bring in a video today of her symptoms, since they are intermittent.  We reviewed this together.  04/07/13 update: The patient has a history of left hemifacial spasm.  She underwent her first Botox injections on 02/24/2013.  She was pleased with efficacy of the injections.  The botox seem to take he can almost immediately.  She has, however, noticed some left facial droop.  This has not been particularly bothersome, but it was definitely noticeable.  She has very rare twitching over the left chin.  11/23/13 update: The patient presents today for followup.  I have not seen her since September, 2014.  The patient reports that her eye began to twitch slowly on the left and it is now twitching almost constantly.  She has developed other symptoms over this past year as well.  She states that she feels tremulous on the inside.  She has developed a tremor in her hands.  She has no balance trouble.  She has developed  double vision, however.  She had a cataract removed from the right eye and that surgery what well, but she recently had a cataract removed from the left eye and she states that that did not go well.  It made the diplopia worse.  Her ophthalmologist, however, cannot find a reason for the double vision.  She admits that she had a double vision prior to cataract surgery, but cataract surgery seemed to make it worse.  She rarely has trouble describing whether it is horizontal or vertical and states that it is both.  She states that it is monocular.  She has no difficulty swallowing.  She has no shortness of breath.  She has no weakness.  She reports that she had a cosmetic laser peel done on her face since last visit.   Neuroimaging was performed on 02/08/2013.  This was a negative MRI of the brain.  I reviewed this with her.      PREVIOUS MEDICATIONS: Not applicable  ALLERGIES:   Allergies  Allergen Reactions  . Codeine Nausea And Vomiting  . Iodine Swelling    Throat swelling  . Shellfish Allergy Swelling    Throat swells    CURRENT MEDICATIONS:  Current Outpatient Prescriptions on File Prior to Visit  Medication Sig Dispense Refill  . alendronate (FOSAMAX) 70 MG tablet TAKE ONE TABLET BY MOUTH  ONCE A WEEK WITH A FULL GLASS OF WATER ON AN EMPTY STOMACH.  12 tablet  0  . ALPRAZolam (XANAX) 0.5 MG tablet TAKE ONE TABLET BY MOUTH TWICE DAILY  60 tablet  5  . FLUoxetine (PROZAC) 40 MG capsule TAKE ONE CAPSULE BY MOUTH ONCE DAILY  30 capsule  0  . GREEN COFFEE BEAN PO Take 1 tablet by mouth daily.      . meloxicam (MOBIC) 15 MG tablet TAKE ONE TABLET BY MOUTH ONCE DAILY  30 tablet  0  . MULTIPLE VITAMIN PO Take 1 tablet by mouth daily.       . SUMAtriptan (IMITREX) 6 MG/0.5ML SOLN injection Inject 0.5 mLs (6 mg total) into the skin once. Subcutaneously injection as needed migraine  1 vial  3  . traZODone (DESYREL) 50 MG tablet TAKE 1 TABLET BY MOUTH AT BEDTIME (QUALITEST BRAND ONLY  [phone])  30 tablet  5  . vitamin B-12 (CYANOCOBALAMIN) 500 MCG tablet Take 1,500 mcg by mouth daily.       No current facility-administered medications on file prior to visit.    PAST MEDICAL HISTORY:   Past Medical History  Diagnosis Date  . HYPERCHOLESTEROLEMIA 03/16/2008    01/10/13;  Pt denies  . ANXIETY DEPRESSION 07/25/2009  . COMMON MIGRAINE 03/16/2008    01/10/13:  resolved  . ALLERGIC RHINITIS CAUSE UNSPECIFIED 11/12/2009  . Irritable bowel syndrome 11/12/2009  . ALOPECIA 03/16/2008  . LOW BACK PAIN, CHRONIC 07/19/2009  . OSTEOPOROSIS 07/08/2010  . CHICKENPOX, HX OF 03/16/2008  . DIVERTICULITIS, HX OF 03/16/2008  . HYPERGLYCEMIA 03/16/2008  . Anxiety     PAST SURGICAL HISTORY:   Past Surgical History  Procedure Laterality Date  . Partial colectomy      in her 20's  . Correction of esotropic  June 06', May '10  . Cholecystectomy    . Arm lift  09-23-12    SOCIAL HISTORY:   History   Social History  . Marital Status: Married    Spouse Name: N/A    Number of Children: N/A  . Years of Education: N/A   Occupational History  . Retired Merchant navy officer)     Social History Main Topics  . Smoking status: Former Smoker    Start date: 01/10/2010  . Smokeless tobacco: Never Used  . Alcohol Use: No  . Drug Use: No  . Sexual Activity: Yes    Partners: Female   Other Topics Concern  . 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   Marriage in good health.      Physician roster   Gyn- Helene Shoe, NP   Opthal- Dr Annamaria Boots (pediatric opthal)   GI- dr Adriana Simas- Dr Gladstone Lighter   ENT- Dr Ernesto Rutherford             FAMILY HISTORY:   Family Status  Relation Status Death Age  . Mother Deceased 41    CAD  . Father Deceased 63    CAD  . Child Alive     2, healthy    ROS:  A complete 10  system review of systems was obtained and was unremarkable apart from what is mentioned above.  PHYSICAL EXAMINATION:    VITALS:   Filed Vitals:   11/23/13 1119  BP: 92/58  Pulse: 68  Resp: 14  Height: 5\' 2"  (1.575 m)  Weight: 133 lb (60.328 kg)    GEN:  Normal appears female in no acute distress.  Appears stated age. HEENT:  Normocephalic, atraumatic. The mucous membranes are moist. The superficial temporal arteries are without ropiness or tenderness.  There is head titubation in the "no" direction, but it is quite mild. Cardiovascular: Regular rate and rhythm. Lungs: Clear to auscultation bilaterally. Neck/Heme: There are no carotid bruits noted bilaterally.  NEUROLOGICAL: Orientation:  The patient is alert and oriented x 3.  Fund of knowledge is appropriate.  Recent and remote memory intact.  Attention span and concentration normal.  Repeats and names without difficulty. Cranial nerves: She continues to have decreased nasolabial fold on the left. She is able to close both eyes and blink, but she cannot squeeze the eye attenuation in the left.  She is able to hold air in the cheeks symmetrically.  Forehead wrinkle is symmetric.  The pupils are equal round and reactive to light bilaterally. Fundoscopic exam reveals clear disc margins bilaterally. Extraocular muscles are intact and visual fields are full to confrontational testing.  There are square wave jerks.  Speech is fluent and clear. Soft palate rises symmetrically and there is no tongue deviation. Hearing is intact to conversational tone. Tone: Tone is good throughout. Sensation: Sensation is intact to light touch throughout Coordination:  The patient has no difficulty with RAM's or FNF bilaterally. Motor: Strength is 5/5 in the bilateral upper and lower extremities.  Shoulder shrug is equal and symmetric. There is no pronator drift.  There are no fasciculations noted. DTR's: Deep tendon reflexes are not tested today Gait and  Station: The patient is able to ambulate without difficulty.  However, there is marked decreased arm swing bilaterally.  She has a negative pull test.  Stride length is good bilaterally. ABNL MOVEMNENTS:  There is mild left hemifacial spasm, including left blepharospasm.  She does have some mild tremor of the outstretched hands.   IMPRESSION/PLAN  1. left hemifacial spasm.  -Although this is likely just a left hemifacial spasm, she has developed diplopia over the years since I have seen her and has also developed loss of arm swing, and I do think that [place] is in the differential.  She is not rigid, however, and I see no other Parkinsonian features.  She does have a tremor, but that was there last visit and I am less worried.  Nonetheless, I will order a DaT and MG panel just to make sure.  If negative, she is ready to proceed with botox 2. mild essential tremor.  -This is very mild in the hands and rarely in the head as well.  Her mother had a tremor.  I would not recommend treating this at this time unless it becomes more bothersome. 3.  I will f/u with her at her next series of injections, which she would like to proceed with.Marland Kitchen

## 2013-11-30 LAB — MYASTHENIA GRAVIS PANEL 2
Acetylcholine Rec Binding: <0.3 nmol/L
Acetylcholine Rec Mod Ab: 18 %
Aceytlcholine Rec Bloc Ab: <15 % of inhibition (ref <15)

## 2013-12-20 ENCOUNTER — Telehealth: Payer: Self-pay | Admitting: Neurology

## 2013-12-20 NOTE — Telephone Encounter (Signed)
She had her DaT scan yesterday. She is going to bring disc by today or tomorrow. Okay to schedule Botox for August 14?

## 2013-12-20 NOTE — Telephone Encounter (Signed)
Yes, but also didn't want to do it until I saw her DaT scan results.  When are those?

## 2013-12-20 NOTE — Telephone Encounter (Signed)
Patient approved for Botox. She just had cosmetic botox between her eye brows on 12/13/2013. She paid out of pocket for this - do we still need to wait 3 months to schedule non cosmetic botox? Please advise.

## 2013-12-20 NOTE — Telephone Encounter (Signed)
If DaT results okay, can schedule botox but should be sept since just had cosmetic.  If continues cosmetic, needs to do same day as our injections

## 2013-12-21 ENCOUNTER — Telehealth: Payer: Self-pay | Admitting: Neurology

## 2013-12-21 NOTE — Telephone Encounter (Signed)
Let pt know that DaT scan is reported to be normal!  [place] news!

## 2013-12-21 NOTE — Telephone Encounter (Signed)
Called patient to set up Botox date in September. September 18th blocked for Botox. To schedule with patient on this date. She needs to wait and schedule cosmetic botox on same day if continuing this. Awaiting call back to make her aware.

## 2013-12-21 NOTE — Telephone Encounter (Signed)
Please call pt- she is returning your call

## 2013-12-21 NOTE — Telephone Encounter (Signed)
Patient aware DaT scan was normal. Appt scheduled for Botox on September 18. She is made aware if she is going to have cosmetic botox she will have to have this done on the same day.

## 2013-12-22 NOTE — Telephone Encounter (Signed)
Patient aware.

## 2014-02-23 ENCOUNTER — Other Ambulatory Visit: Payer: Self-pay | Admitting: Neurology

## 2014-02-23 DIAGNOSIS — G5139 Clonic hemifacial spasm, unspecified: Secondary | ICD-10-CM

## 2014-03-30 ENCOUNTER — Ambulatory Visit (INDEPENDENT_AMBULATORY_CARE_PROVIDER_SITE_OTHER): Payer: Medicare HMO | Admitting: Neurology

## 2014-03-30 ENCOUNTER — Encounter: Payer: Self-pay | Admitting: Neurology

## 2014-03-30 VITALS — BP 120/78 | HR 77 | Ht 62.0 in | Wt 135.2 lb

## 2014-03-30 DIAGNOSIS — G5139 Clonic hemifacial spasm, unspecified: Secondary | ICD-10-CM

## 2014-03-30 DIAGNOSIS — G518 Other disorders of facial nerve: Secondary | ICD-10-CM

## 2014-03-30 MED ORDER — ONABOTULINUMTOXINA 100 UNITS IJ SOLR
20.0000 [IU] | Freq: Once | INTRAMUSCULAR | Status: AC
  Administered 2014-03-30: 20 [IU] via INTRAMUSCULAR

## 2014-03-30 NOTE — Procedures (Signed)
[facility]   History:  Diagnosis: Hemifacial spasm (351.8)  Initial side: left   Result History    Adverse Effects: mild L facial drrop  Consent obtained from: The patient Benefits discussed included, but were not limited to decreased muscle tightness, increased joint range of motion, and decreased pain.  Risk discussed included, but were not limited pain and discomfort, bleeding, bruising, excessive weakness, venous thrombosis, muscle atrophy and dysphagia.  A copy of the patient medication guide was given to the patient which explains the blackbox warning.  Patients identity and treatment sites confirmed yes.  Details of Procedure: Skin was cleaned with alcohol.   Prior to injection, the needle plunger was aspirated to make sure the needle was not within a blood vessel.  There was no blood retrieved on aspiration.    Following is a summary of the muscles injected  And the amount of Botulinum toxin used:  Injections  Location Left  Right Units Number of sites        Corrugator      Frontalis      Lower Lid, Lateral 2.5   2.5    Lower Lid Medial  2.5    2.5    Upper Lid, Lateral 2.5    2.5   Upper Lid, Medial 2.5  2.5   Canthus 5.0 2.5 7.5   Nasalis 2.5  2.5   Masseter      Procerus      Zygomaticus Major      TOTAL UNITS:   20.0    Agent: Botulinum Type A ( Onobotulinum Toxin type A ).  1 vials of Botox were used, each containing 50 units and freshly diluted with 2 mL of sterile, non-perserved saline   Total injected (Units): 20  Total wasted (Units): 0.0 Pt tolerated procedure well without complications.   Reinjection is anticipated in 3 months.

## 2014-06-20 ENCOUNTER — Other Ambulatory Visit: Payer: Self-pay | Admitting: Neurology

## 2014-06-20 DIAGNOSIS — G5139 Clonic hemifacial spasm, unspecified: Secondary | ICD-10-CM

## 2014-06-22 ENCOUNTER — Telehealth: Payer: Self-pay | Admitting: Neurology

## 2014-06-22 NOTE — Telephone Encounter (Signed)
Pt called to cancel her 12/18 botox appt. She did not give a reason. She will call later to r/s.

## 2014-06-29 ENCOUNTER — Ambulatory Visit: Payer: Medicare HMO | Admitting: Neurology

## 2014-08-07 ENCOUNTER — Encounter: Payer: Self-pay | Admitting: Cardiology

## 2014-08-07 ENCOUNTER — Other Ambulatory Visit: Payer: Self-pay | Admitting: Cardiology

## 2014-08-07 DIAGNOSIS — I209 Angina pectoris, unspecified: Secondary | ICD-10-CM

## 2014-08-07 NOTE — Progress Notes (Unsigned)
Patient ID: [name], female   DOB: 1946/06/09, 69 y.o.   MRN: [id number]   Charmain, Diosdado  Date of visit:  08/06/2014 DOB:  1946/04/28    Age:  69 yrs. Medical record number:  [id number]     Account number:  [id number] Primary Care Provider: [facility] ____________________________ CURRENT DIAGNOSES  1. Angina pectoris, unspecified  2. Other hyperlipidemia  3. Abnormal electrocardiogram [ECG] [EKG]  4. Chest pain ____________________________ ALLERGIES  Codeine, Vomiting  Shellfish Derived, Swelling-face & neck ____________________________ MEDICATIONS  1. multivitamin tablet, 1 p.o. daily  2. trazodone 50 mg tablet, QHS  3. alprazolam 0.5 mg tablet, BID  4. meloxicam 15 mg tablet, 1 p.o. daily  5. Imitrex 5 mg/actuation nasal spray, Take as directed  6. fluoxetine 40 mg capsule, 1 p.o. daily  7. alendronate 70 mg tablet, weekly  8. Tylenol Extra Strength 500 mg tablet, Take as directed  9. nitroglycerin 0.4 mg sublingual tablet, PRN  10. Body, Hair, Skin & Nails 3 mg-133 mcg capsule, 1 p.o. daily  11. Vitamin C 250 mg tablet, 2 p.o. daily ____________________________ CHIEF COMPLAINTS  Chest + jaw pain sat   took 3 ntg for relief ____________________________ HISTORY OF PRESENT ILLNESS Patient seen early for cardiac evaluation. She had been doing well until last week when she became severely fatigued and found that she would have to sleep extra hours and had extreme lack of energy. She had previously not been having any anginal type pains and had a negative myocardial perfusion scan last May. On Saturday while carrying her grandson down stairs she developed pressure-like pain in her left upper sternal area that radiated through to her back and up into her jaw and was associated with some sweating and mild dyspnea.. The discomfort lasted around 25 minutes and was relieved after a total of 3 nitroglycerin. She has not had recurrent symptoms since then. She has not had symptoms at  rest in peace and did not have exertional type symptoms. She does have a strong family history of cardiac disease. She previously never did start statin therapy. ____________________________ PAST HISTORY  Past Medical Illnesses:  anxiety, depression, migraine headaches;  Cardiovascular Illnesses:  palpitations;  Surgical Procedures:  cholecystectomy, breast augmentation, cataract extraction OU, colon resection in 20's;  NYHA Classification:  I;  Canadian Angina Classification:  Class 0: Asymptomatic;  Cardiology Procedures-Invasive:  no history of prior cardiac procedures;  Cardiology Procedures-Noninvasive:  treadmill cardiolite May 2015;  LVEF of 74% documented via nuclear study on 12/07/2013,   ____________________________ CARDIO-PULMONARY TEST DATES EKG Date:  08/06/2014;  Nuclear Study Date:  12/07/2013;  Chest Xray Date: 11/27/2013;   ____________________________ FAMILY HISTORY Father -- Father dead, Heart disease Mother -- Mother dead, Heart disease Sister -- Sister dead, Diabetes mellitus, Heart disease ____________________________ SOCIAL HISTORY Alcohol Use:  no alcohol use;  Smoking:  used to smoke but quit 2010, 30 pack year history;  Lifestyle:  divorced, remarried and 2 sons;  Exercise:  exercises regularly;  Occupation:  retired Psychiatric nurse;   ____________________________ REVIEW OF SYSTEMS General:  denies recent weight change, fatique or change in exercise tolerance. Eyes: cataract extraction Respiratory: mild dyspnea with exertion Cardiovascular:  please review HPI Abdominal: intermittent diarrhea and diverticulitis Musculoskeletal:  chronic low back pain Psychiatric:  anxiety  ____________________________ PHYSICAL EXAMINATION VITAL SIGNS  Blood Pressure:  110/60 Sitting, Left arm, regular cuff  , 104/64 Standing, Left arm and regular cuff   Pulse:  82/min. Weight:  134.00 lbs. Height:  62"BMI: 24  Constitutional:  pleasant white female, in no acute distress Skin:  warm and  dry to touch, no apparent skin lesions, or masses noted. Head:  normocephalic, normal hair pattern, no masses or tenderness Eyes:  EOMS Intact, PERRLA, C and S clear, Funduscopic exam not done. ENT:  ears, nose and throat reveal no gross abnormalities.  Dentition good. Neck:  supple, without massess. No JVD, thyromegaly or carotid bruits. Carotid upstroke normal. Chest:  normal symmetry, clear to auscultation. Cardiac:  regular rhythm, normal S1 and S2, No S3 or S4, no murmurs, gallops or rubs detected. Extremities & Back:  no deformities, clubbing, cyanosis, erythema or edema observed. Normal muscle strength and tone. Neurological:  no gross motor or sensory deficits noted, affect appropriate, oriented x3. ____________________________ MOST RECENT LIPID PANEL 09/29/13  CHOL TOTL 184 mg/dl, LDL 110 NM, HDL 65 mg/dl, TRIGLYCER 46 mg/dl, ALT 19 u/l, ALK PHOS 32 u/l, CHOL/HDL 2.8 (Calc)  ____________________________ IMPRESSIONS/PLAN  1. Prolonged episode of chest discomfort that sounds ischemic in origin relief with 3 nitroglycerin 2. Significant family history of cardiac disease  Recommendations:  Lab work as noted below was done. EKG shows minor nonspecific changes in the inferolateral leads. This prolonged episode of pain is somewhat worrisome and I suggested that she go go ahead and obtain imaging of her coronary arteries to look for atherosclerosis and calcium. We discussed the options of either a coronary CTA versus catheterization. We will try to get a coronary CTA arranged. In the interim I asked her to begin aspirin daily we'll see her after the imaging study. I discussed cardiac catheterization risks also with her if needed. ____________________________ TODAYS ORDERS  1. 12 Lead EKG: Today  2. Basic Metabolic Panel: Today  3. Cardiac CTA: First Available  4. Complete Blood Count: Today                       ____________________________ Cardiology Physician:  Kerry Hough MD [facility]

## 2014-08-10 ENCOUNTER — Ambulatory Visit (HOSPITAL_COMMUNITY)
Admission: RE | Admit: 2014-08-10 | Discharge: 2014-08-10 | Disposition: A | Discharge status: Home / Self Care | Payer: Medicare Other | Source: Ambulatory Visit | Attending: Cardiology | Admitting: Cardiology

## 2014-08-10 ENCOUNTER — Encounter (HOSPITAL_COMMUNITY): Payer: Self-pay

## 2014-08-10 DIAGNOSIS — I209 Angina pectoris, unspecified: Secondary | ICD-10-CM | POA: Insufficient documentation

## 2014-08-10 DIAGNOSIS — R079 Chest pain, unspecified: Secondary | ICD-10-CM

## 2014-08-10 MED ORDER — METOPROLOL TARTRATE 1 MG/ML IV SOLN
5.0000 mg | INTRAVENOUS | Status: DC | PRN
  Administered 2014-08-10 (×2): 5 mg via INTRAVENOUS
  Filled 2014-08-10: qty 5

## 2014-08-10 MED ORDER — METOPROLOL TARTRATE 1 MG/ML IV SOLN
INTRAVENOUS | Status: AC
  Administered 2014-08-10: 5 mg via INTRAVENOUS
  Filled 2014-08-10: qty 5

## 2014-08-10 MED ORDER — IOHEXOL 350 MG/ML SOLN: 80.0000 mL | Freq: Once | INTRAVENOUS | Status: AC | PRN

## 2014-08-10 MED ORDER — METOPROLOL TARTRATE 1 MG/ML IV SOLN
INTRAVENOUS | Status: AC
  Filled 2014-08-10: qty 5

## 2014-08-10 MED ORDER — NITROGLYCERIN 0.4 MG SL SUBL
SUBLINGUAL_TABLET | SUBLINGUAL | Status: AC
  Filled 2014-08-10: qty 1

## 2014-08-15 ENCOUNTER — Encounter (HOSPITAL_COMMUNITY): Payer: Self-pay | Admitting: Cardiology

## 2014-08-15 DIAGNOSIS — F419 Anxiety disorder, unspecified: Secondary | ICD-10-CM

## 2014-08-15 DIAGNOSIS — I200 Unstable angina: Secondary | ICD-10-CM

## 2014-08-15 DIAGNOSIS — I2510 Atherosclerotic heart disease of native coronary artery without angina pectoris: Secondary | ICD-10-CM

## 2014-08-15 MED ORDER — SODIUM CHLORIDE 0.9 % IJ SOLN: 3.0000 mL | INTRAMUSCULAR | Status: DC | PRN

## 2014-08-15 MED ORDER — SODIUM CHLORIDE 0.9 % IV SOLN
INTRAVENOUS | Status: DC
  Administered 2014-08-16: 09:00:00 via INTRAVENOUS

## 2014-08-15 MED ORDER — ASPIRIN 81 MG PO CHEW: 81.0000 mg | CHEWABLE_TABLET | ORAL | Status: AC

## 2014-08-15 MED ORDER — SODIUM CHLORIDE 0.9 % IV SOLN: 250.0000 mL | INTRAVENOUS | Status: DC | PRN

## 2014-08-15 MED ORDER — SODIUM CHLORIDE 0.9 % IJ SOLN: 3.0000 mL | Freq: Two times a day (BID) | INTRAMUSCULAR | Status: DC

## 2014-08-15 NOTE — H&P (Signed)
[name]  Date of visit:  08/15/2014 DOB:  08/04/1945    Age:  69 yrs. Medical record number:  [id number]     Account number:  [id number] Primary Care Provider: [facility] ____________________________ CURRENT DIAGNOSES  1. Angina pectoris, unspecified  2. CAD native with other angina  3. Encounter for preprocedural cardiovascular examination  4. Other hyperlipidemia  5. Abnormal electrocardiogram [ECG] [EKG]  6. Chest pain ____________________________ ALLERGIES  Codeine, Vomiting  Shellfish Derived, Swelling-face & neck ____________________________ MEDICATIONS  1. multivitamin tablet, 1 p.o. daily  2. trazodone 50 mg tablet, QHS  3. fluoxetine 40 mg capsule, 1 p.o. daily  4. alendronate 70 mg tablet, weekly  5. Tylenol Extra Strength 500 mg tablet, Take as directed  6. nitroglycerin 0.4 mg sublingual tablet, PRN  7. Body, Hair, Skin & Nails 3 mg-133 mcg capsule, 1 p.o. daily  8. Vitamin C 250 mg tablet, 2 p.o. daily  9. clopidogrel 75 mg tablet, 1 p.o. daily  10. diltiazem ER 180 mg tablet,extended release 24 hr, 1 p.o. daily  11. alprazolam 0.5 mg tablet, qd or prn  12. Crestor 20 mg tablet, 1 p.o. daily ____________________________ CHIEF COMPLAINTS  Followup of Abnormal electrocardiogram [ECG] [EKG]  Preop eval ____________________________ HISTORY OF PRESENT ILLNESS  This 69 year old female is seen for precatheterization evaluation. She was initially seen in May when she presented with some discomfort in her throat and going down her right arm when walking hills. She has a significant history of anxiety and also has a strong family history of cardiac disease. She has some slight T-wave flattening on her baseline EKG and underwent a myocardial perfusion scan on May 29 where she walked into a Bruce stage III for 8 minutes and had very minimal ST depression at peak exercise with no arrhythmias. There was no myocardial ischemia with an ejection fraction of 74%. She was  treated initially with metoprolol and with Lipitor but stopped the metoprolol because it made her feel bad and later stopped the Lipitor because she felt the same thing. She was seen in September at which time she was really not having any angina. She was symptom-free until January 25 when she presented with a prolonged episode of chest discomfort while carrying her grandchild down the stairs that was relieved with 3 nitroglycerin. An EKG was unremarkable and arrangements were made for her to have a coronary CTA that was done on January 29. There were findings of a moderate stenosis involving the mid LAD. The patient had another episode of chest discomfort relieved with one nitroglycerin after receiving a phone call on Monday of this week and was seen today. She has not had any recurrence of chest discomfort and has been started on clopidogrel 75 mg for which she has received a total of 2 doses as well as diltiazem because she was previously intolerant to metoprolol.  She is quite anxious about her situation in light of her family history and wishes to have cardiac catheterization through the radial approach. Because of the moderate stenosis and ongoing symptoms she is referred for catheterization. ____________________________ PAST HISTORY  Past Medical Illnesses:  anxiety, depression, migraine headaches;  Cardiovascular Illnesses:  CAD;  Surgical Procedures:  cholecystectomy, breast augmentation, cataract extraction OU, colon resection in 20's;  NYHA Classification:  I;  Canadian Angina Classification:  Class 3: Angina with mild exertion;  Cardiology Procedures-Invasive:  no history of prior cardiac procedures;  Cardiology Procedures-Noninvasive:  treadmill cardiolite May 2015, coronary CTA 1/15;  LVEF of 74%  documented via nuclear study on 12/07/2013,   ____________________________ CARDIO-PULMONARY TEST DATES EKG Date:  08/06/2014;  Nuclear Study Date:  12/07/2013;  Chest Xray Date: 11/27/2013;    ____________________________ FAMILY HISTORY Father -- Father dead, Heart disease Mother -- Mother dead, Heart disease Sister -- Sister dead, Diabetes mellitus, Heart disease ____________________________ SOCIAL HISTORY Alcohol Use:  no alcohol use;  Smoking:  used to smoke but quit 2010, 30 pack year history;  Lifestyle:  divorced, remarried and 2 sons;  Exercise:  exercises regularly;  Occupation:  retired Psychiatric nurse;   ____________________________ REVIEW OF SYSTEMS General:  denies recent weight change, fatique or change in exercise tolerance. Eyes: cataract extraction Ears, Nose, Throat, Mouth:  denies any hearing loss, epistaxis, hoarseness or difficulty speaking. Respiratory: mild dyspnea with exertion Cardiovascular:  please review HPI Abdominal: intermittent diarrhea and diverticulitis Musculoskeletal:  chronic low back pain Psychiatric:  anxiety  ____________________________ PHYSICAL EXAMINATION VITAL SIGNS  Blood Pressure:  104/60 Sitting, Right arm, regular cuff  , 100/60 Standing, Right arm and regular cuff   Pulse:  76/min. Weight:  134.00 lbs. Height:  62"BMI: 24  Constitutional:  pleasant but anxious white female, in no acute distress Skin:  warm and dry to touch, no apparent skin lesions, or masses noted. Head:  normocephalic, normal hair pattern, no masses or tenderness Eyes:  EOMS Intact, PERRLA, C and S clear, Funduscopic exam not done. ENT:  ears, nose and throat reveal no gross abnormalities.  Dentition good. Neck:  supple, without massess. No JVD, thyromegaly or carotid bruits. Carotid upstroke normal. Chest:  normal symmetry, clear to auscultation. Cardiac:  regular rhythm, normal S1 and S2, No S3 or S4, no murmurs, gallops or rubs detected. Peripheral Pulses:  the femoral, popliteal, dorsalis pedis, and posterior tibial pulses are full and equal bilaterally with no bruits auscultated. Extremities & Back:  no deformities, clubbing, cyanosis, erythema or edema  observed. Normal muscle strength and tone. Neurological:  no gross motor or sensory deficits noted, affect appropriate, oriented x3. ____________________________ MOST RECENT LIPID PANEL 09/29/13  CHOL TOTL 184 mg/dl, LDL 110 NM, HDL 65 mg/dl, TRIGLYCER 46 mg/dl,  CHOL/HDL 2.8 (Calc)  ____________________________ IMPRESSIONS/PLAN 1. 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 Hyperlipidemia 3. Positive family history of cardiac disease 4. Anxiety  Recommendations:  The patient has been started on low-dose aspirin, Plavix which she has now received 2 doses as well as diltiazem sustained release because she was unable to tolerate metoprolol. Crestor 20 mg was added today. Cardiac catheterization was discussed with the patient including risks of myocardial infarction, death, stroke, bleeding, arrhythmia, dye allergy, or renal insufficiency. He understands and is willing to proceed. Possibility of percutaneous intervention at the same setting was also discussed with the patient including risks. We also discussed that if she does not have a stenosis of known severity visually for stenting that she could possibly have FFR done. We discussed length of stay, possible overnight stay, and sedation for anxiety during the procedure. She understands the procedure will be done at  9 am tomorrow and that Dr. Daneen Schick will be doing the procedure. All questions were answered and she is agreeable to proceed. ____________________________ TODAYS ORDERS  1. Comprehensive Metabolic Panel: Today  2. Complete Blood Count: Today  3. Draw PT/INR: Today  4. PTT: Today                       ____________________________ Cardiology Physician:  Kerry Hough MD [facility]

## 2014-08-16 ENCOUNTER — Encounter (HOSPITAL_COMMUNITY)
Admission: RE | Disposition: A | Discharge status: Home / Self Care | Payer: Self-pay | Source: Ambulatory Visit | Attending: Interventional Cardiology

## 2014-08-16 ENCOUNTER — Encounter (HOSPITAL_COMMUNITY): Payer: Self-pay | Admitting: Interventional Cardiology

## 2014-08-16 ENCOUNTER — Ambulatory Visit (HOSPITAL_COMMUNITY)
Admission: RE | Admit: 2014-08-16 | Discharge: 2014-08-16 | Disposition: A | Discharge status: Home / Self Care | Payer: Medicare Other | Source: Ambulatory Visit | Attending: Interventional Cardiology | Admitting: Interventional Cardiology

## 2014-08-16 DIAGNOSIS — F329 Major depressive disorder, single episode, unspecified: Secondary | ICD-10-CM | POA: Insufficient documentation

## 2014-08-16 DIAGNOSIS — I25119 Atherosclerotic heart disease of native coronary artery with unspecified angina pectoris: Secondary | ICD-10-CM | POA: Diagnosis not present

## 2014-08-16 DIAGNOSIS — G43909 Migraine, unspecified, not intractable, without status migrainosus: Secondary | ICD-10-CM | POA: Insufficient documentation

## 2014-08-16 DIAGNOSIS — R079 Chest pain, unspecified: Secondary | ICD-10-CM | POA: Diagnosis present

## 2014-08-16 DIAGNOSIS — E785 Hyperlipidemia, unspecified: Secondary | ICD-10-CM | POA: Insufficient documentation

## 2014-08-16 DIAGNOSIS — E7800 Pure hypercholesterolemia, unspecified: Secondary | ICD-10-CM | POA: Diagnosis present

## 2014-08-16 DIAGNOSIS — F419 Anxiety disorder, unspecified: Secondary | ICD-10-CM | POA: Diagnosis not present

## 2014-08-16 DIAGNOSIS — Z79899 Other long term (current) drug therapy: Secondary | ICD-10-CM | POA: Insufficient documentation

## 2014-08-16 DIAGNOSIS — I2510 Atherosclerotic heart disease of native coronary artery without angina pectoris: Secondary | ICD-10-CM

## 2014-08-16 DIAGNOSIS — I200 Unstable angina: Secondary | ICD-10-CM

## 2014-08-16 HISTORY — PX: LEFT HEART CATHETERIZATION WITH CORONARY ANGIOGRAM: SHX5451

## 2014-08-16 HISTORY — DX: Atherosclerotic heart disease of native coronary artery without angina pectoris: I25.10

## 2014-08-16 HISTORY — DX: Major depressive disorder, single episode, unspecified: F32.9

## 2014-08-16 HISTORY — DX: Depression, unspecified: F32.A

## 2014-08-16 LAB — CK TOTAL AND CKMB (NOT AT ARMC)
CK, MB: 1.6 ng/mL (ref 0.3–4.0)
Relative Index: INVALID (ref 0.0–2.5)
Total CK: 60 U/L (ref 7–177)

## 2014-08-16 SURGERY — LEFT HEART CATHETERIZATION WITH CORONARY ANGIOGRAM

## 2014-08-16 MED ORDER — ONDANSETRON HCL 4 MG/2ML IJ SOLN: 4.0000 mg | Freq: Four times a day (QID) | INTRAMUSCULAR | Status: DC | PRN

## 2014-08-16 MED ORDER — DIPHENHYDRAMINE HCL 50 MG/ML IJ SOLN: 25.0000 mg | INTRAMUSCULAR | Status: DC

## 2014-08-16 MED ORDER — MIDAZOLAM HCL 2 MG/2ML IJ SOLN
INTRAMUSCULAR | Status: AC
  Filled 2014-08-16: qty 2

## 2014-08-16 MED ORDER — FAMOTIDINE IN NACL 20-0.9 MG/50ML-% IV SOLN: 20.0000 mg | INTRAVENOUS | Status: DC

## 2014-08-16 MED ORDER — FAMOTIDINE IN NACL 20-0.9 MG/50ML-% IV SOLN
INTRAVENOUS | Status: AC
  Administered 2014-08-16: 20 mg
  Filled 2014-08-16: qty 50

## 2014-08-16 MED ORDER — ACETAMINOPHEN 325 MG PO TABS: 650.0000 mg | ORAL_TABLET | ORAL | Status: DC | PRN

## 2014-08-16 MED ORDER — DIPHENHYDRAMINE HCL 50 MG/ML IJ SOLN
INTRAMUSCULAR | Status: AC
  Administered 2014-08-16: 25 mg
  Filled 2014-08-16: qty 1

## 2014-08-16 MED ORDER — FENTANYL CITRATE 0.05 MG/ML IJ SOLN
INTRAMUSCULAR | Status: AC
  Filled 2014-08-16: qty 2

## 2014-08-16 MED ORDER — SODIUM CHLORIDE 0.9 % IV SOLN
INTRAVENOUS | Status: AC
Start: 2014-08-16 — End: 2014-08-16

## 2014-08-16 MED ORDER — METHYLPREDNISOLONE SODIUM SUCC 125 MG IJ SOLR
INTRAMUSCULAR | Status: AC
  Filled 2014-08-16: qty 2

## 2014-08-16 NOTE — Discharge Instructions (Signed)
Radial Site Care °Refer to this sheet in the next few weeks. These instructions provide you with information on caring for yourself after your procedure. Your caregiver may also give you more specific instructions. Your treatment has been planned according to current medical practices, but problems sometimes occur. Call your caregiver if you have any problems or questions after your procedure. °HOME CARE INSTRUCTIONS °· You may shower the day after the procedure. Remove the bandage (dressing) and gently wash the site with plain soap and water. Gently pat the site dry. °· Do not apply powder or lotion to the site. °· Do not submerge the affected site in water for 3 to 5 days. °· Inspect the site at least twice daily. °· Do not flex or bend the affected arm for 24 hours. °· No lifting over 5 pounds (2.3 kg) for 5 days after your procedure. °· Do not drive home if you are discharged the same day of the procedure. Have someone else drive you. °· You may drive 24 hours after the procedure unless otherwise instructed by your caregiver. °· Do not operate machinery or power tools for 24 hours. °· A responsible adult should be with you for the first 24 hours after you arrive home. °What to expect: °· Any bruising will usually fade within 1 to 2 weeks. °· Blood that collects in the tissue (hematoma) may be painful to the touch. It should usually decrease in size and tenderness within 1 to 2 weeks. °SEEK IMMEDIATE MEDICAL CARE IF: °· You have unusual pain at the radial site. °· You have redness, warmth, swelling, or pain at the radial site. °· You have drainage (other than a small amount of blood on the dressing). °· You have chills. °· You have a fever or persistent symptoms for more than 72 hours. °· You have a fever and your symptoms suddenly get worse. °· Your arm becomes pale, cool, tingly, or numb. °· You have heavy bleeding from the site. Hold pressure on the site. °Document Released: 08/01/2010 Document Revised:  09/21/2011 Document Reviewed: 08/01/2010 °ExitCare® Patient Information ©2015 [employer]. This information is not intended to replace advice given to you by your health care provider. Make sure you discuss any questions you have with your health care provider. ° °

## 2014-08-16 NOTE — Research (Signed)
Bioflow Informed Consent   Subject Name: [name]  Subject met inclusion and exclusion criteria.  The informed consent form, study requirements and expectations were reviewed with the subject and questions and concerns were addressed prior to the signing of the consent form.  The subject verbalized understanding of the trail requirements.  The subject agreed to participate in the Bioflow trial and signed the informed consent.  The informed consent was obtained prior to performance of any protocol-specific procedures for the subject.  A copy of the signed informed consent was given to the subject and a copy was placed in the subject's medical record.  Sandie Ano 08/16/2014, 7:35

## 2014-08-16 NOTE — CV Procedure (Signed)
     Left Heart Catheterization with Coronary Angiography Report  [name]  69 y.o.  female May 18, 1946  Procedure Date: 08/16/2014 Referring Physician: Jacolyn Reedy, M.D. Primary Cardiologist: Jacolyn Reedy, M.D.  INDICATIONS: Recurring episodes of chest pain of anginal quality with atypical features. Recent coronary CT angiography demonstrating moderate obstruction within a calcified segment in the mid LAD. This study is requested by Dr. Wynonia Lawman to exclude the possibility of high-grade obstruction in the LAD.  PROCEDURE: 1. Left heart catheterization; 2. Coronary angiography; 3. Left ventriculography  CONSENT:  The risks, benefits, and details of the procedure were explained in detail to the patient. Risks including death, stroke, heart attack, kidney injury, allergy, limb ischemia, bleeding and radiation injury were discussed.  The patient verbalized understanding and wanted to proceed.  Informed written consent was obtained.  PROCEDURE TECHNIQUE:  After Xylocaine anesthesia a a 5 French Slender sheath was placed in the right radial artery with an angiocath and the modified Seldinger technique.  Coronary angiography was done using a 5 F JR 4 and JL 3.5 cm diagnostic catheter.  Left ventriculography was done using the JR 4 catheter and hand injection.   We review the digital images. Images were reviewed with Dr. Wynonia Lawman. The area of concern on CT angiography at less than 50% narrowing by angiographic criteria and probably more in the 25-40% range.   CONTRAST:  Total of 90 cc.  COMPLICATIONS:  None   HEMODYNAMICS:  Aortic pressure 102/56 mmHg; LV pressure 107/0 mmHg; LVEDP 9 mmHg  ANGIOGRAPHIC DATA:   The left main coronary artery is normal.  The left anterior descending artery is very large and supplies a large branching diagonal and first set of perforator. The LAD wraps around the apex. After the diagonal and septal perforator there is a tubular region of calcified  disease that obstructs the vessel by no more than 50% and more accurately approximately 30-40%..  The left circumflex artery is normal. There is one obtuse marginal that bifurcates on the lateral/inferolateral wall..  The right coronary artery is dominant and normal. There is ostial calcification. A large PDA and 2 left ventricular branches arise from the distal right coronary.   LEFT VENTRICULOGRAM:  Left ventricular angiogram was done in the 30 RAO projection and revealed a normal size cavity and normal symmetrical contractility. EF 60%.   IMPRESSIONS:  1. Calcified mid LAD segment with 30-40% narrowing after the first septal perforator and diagonal. Coronaries are otherwise normal. 2. Normal left ventricular function   RECOMMENDATION:  Risk factor modification and management per Dr. Wynonia Lawman.

## 2014-08-16 NOTE — Interval H&P Note (Signed)
Cath Lab Visit (complete for each Cath Lab visit)  Clinical Evaluation Leading to the Procedure:   ACS: No.  Non-ACS:    Anginal Classification: CCS III  Anti-ischemic medical therapy: Minimal Therapy (1 class of medications)  Non-Invasive Test Results: Intermediate-risk stress test findings: cardiac mortality 1-3%/year  Prior CABG: No previous CABG      History and Physical Interval Note:  08/16/2014 9:14 AM  [name]  has presented today for surgery, with the diagnosis of cp  The various methods of treatment have been discussed with the patient and family. After consideration of risks, benefits and other options for treatment, the patient has consented to  Procedure(s): LEFT HEART CATHETERIZATION WITH CORONARY ANGIOGRAM (N/A) as a surgical intervention .  The patient's history has been reviewed, patient examined, no change in status, stable for surgery.  I have reviewed the patient's chart and labs.  Questions were answered to the patient's satisfaction.     Sinclair Grooms

## 2015-01-07 ENCOUNTER — Other Ambulatory Visit: Payer: Self-pay

## 2015-06-10 ENCOUNTER — Telehealth: Payer: Self-pay | Admitting: Neurology

## 2015-06-10 NOTE — Telephone Encounter (Signed)
Patient hasn't been seen in over a year- will you need a regular follow up prior to scheduling a Botox appt?

## 2015-06-10 NOTE — Telephone Encounter (Signed)
Left message on machine for patient to call back. To make her aware she will need a regular follow up appt. Awaiting call back.

## 2015-06-10 NOTE — Telephone Encounter (Signed)
-----   Message from [facility] sent at 06/10/2015 10:30 AM EST ----- PT called and wanted to set up a Botox Injection appointment, when is the next Botox day/Dawn

## 2015-06-10 NOTE — Telephone Encounter (Signed)
Appt made with patient for a follow up appt.

## 2015-06-10 NOTE — Telephone Encounter (Signed)
Yes.  Will have no idea how much botox, areas to inject, SE from last time if don't have visit

## 2015-06-21 ENCOUNTER — Ambulatory Visit (INDEPENDENT_AMBULATORY_CARE_PROVIDER_SITE_OTHER): Payer: Medicare Other | Admitting: Neurology

## 2015-06-21 ENCOUNTER — Encounter: Payer: Self-pay | Admitting: Neurology

## 2015-06-21 VITALS — BP 110/72 | HR 74 | Ht 62.0 in | Wt 140.0 lb

## 2015-06-21 DIAGNOSIS — G518 Other disorders of facial nerve: Secondary | ICD-10-CM | POA: Diagnosis not present

## 2015-06-21 DIAGNOSIS — G5139 Clonic hemifacial spasm, unspecified: Secondary | ICD-10-CM

## 2015-06-21 DIAGNOSIS — G250 Essential tremor: Secondary | ICD-10-CM

## 2015-06-21 NOTE — Progress Notes (Signed)
[name] was seen today in neurologic consultation at the request of Gerrit Heck, MD.  The consultation is for the evaluation of facial twitching.  The pt reports that it has been going on intermittently for about a year.  It is not continuous.  It seems to cluster and go for 3-4 days and then stop.  It is always on the L side of the face.  The longest it goes away is 3-4 days.  She notices no initiating factors.  She does admit to a tremor in the hands for the last 3-4 years.  There is an internal tremor as well.  Her mother had a similar shake.  Because of this, she has trouble writing (shaking, not micrographia) and has trouble putting on her makeup.  She does not drink caffeine or alcohol so does not know affect on tremor.  She has had no change in balance.  She has no voice changes.  She has intermittent congenital strabismus and rarely gets diplopia because of this.  Her bladder is under control but she has frequent urination and has bowel urgency (hx of IBS).    The patient does bring in a video today of her symptoms, since they are intermittent.  We reviewed this together.  04/07/13 update: The patient has a history of left hemifacial spasm.  She underwent her first Botox injections on 02/24/2013.  She was pleased with efficacy of the injections.  The botox seem to take he can almost immediately.  She has, however, noticed some left facial droop.  This has not been particularly bothersome, but it was definitely noticeable.  She has very rare twitching over the left chin.  11/23/13 update: The patient presents today for followup.  I have not seen her since September, 2014.  The patient reports that her eye began to twitch slowly on the left and it is now twitching almost constantly.  She has developed other symptoms over this past year as well.  She states that she feels tremulous on the inside.  She has developed a tremor in her hands.  She has no balance trouble.  She has developed  double vision, however.  She had a cataract removed from the right eye and that surgery what well, but she recently had a cataract removed from the left eye and she states that that did not go well.  It made the diplopia worse.  Her ophthalmologist, however, cannot find a reason for the double vision.  She admits that she had a double vision prior to cataract surgery, but cataract surgery seemed to make it worse.  She rarely has trouble describing whether it is horizontal or vertical and states that it is both.  She states that it is monocular.  She has no difficulty swallowing.  She has no shortness of breath.  She has no weakness.  She reports that she had a cosmetic laser peel done on her face since last visit.  06/21/15 update:  The patient is following up today.  I have not seen her in over a year.  She has a history of left hemifacial spasm.  Her last Botox was on 03/30/2014.  She wishes to restart this.  She does state that the last botox caused some eye droop and she didn't realize it was from the botox and she ended up going to the eye doc and she almost had an eye surgery.  She states that upper lid did not droop but the lower lid was droopy and  she couldn't close the eye properly.  Now the hemifacial spasm has come back with a vengence.  She is now noting a pull even in the neck.     Neuroimaging was performed on 02/08/2013.  This was a negative MRI of the brain.  I reviewed this with her.      PREVIOUS MEDICATIONS: Not applicable  ALLERGIES:   Allergies  Allergen Reactions  . Codeine Nausea And Vomiting  . Shellfish Allergy Swelling    Throat swells    CURRENT MEDICATIONS:  Current Outpatient Prescriptions on File Prior to Visit  Medication Sig Dispense Refill  . ALPRAZolam (XANAX) 0.5 MG tablet TAKE ONE TABLET BY MOUTH TWICE DAILY 60 tablet 5  . FLUoxetine (PROZAC) 40 MG capsule TAKE ONE CAPSULE BY MOUTH ONCE DAILY 30 capsule 0  . Multiple Vitamin (MULTI-VITAMINS) TABS Take 1  tablet by mouth daily.     . SUMAtriptan (IMITREX) 6 MG/0.5ML SOLN injection Inject 0.5 mLs (6 mg total) into the skin once. Subcutaneously injection as needed migraine 1 vial 3  . traZODone (DESYREL) 50 MG tablet TAKE 1 TABLET BY MOUTH AT BEDTIME (QUALITEST BRAND ONLY [phone]) 30 tablet 5   No current facility-administered medications on file prior to visit.    PAST MEDICAL HISTORY:   Past Medical History  Diagnosis Date  . Asthma   . Irritable bowel syndrome   . Hyperlipidemia   . History of migraines   . Depression   . Anxiety   . CAD (coronary artery disease), native coronary artery     PAST SURGICAL HISTORY:   Past Surgical History  Procedure Laterality Date  . Partial colectomy      in her 20's  . Correction of esotropic  June 06', May '10  . Cholecystectomy    . Arm lift  09-23-12  . Breast enhancement surgery    . Left heart catheterization with coronary angiogram N/A 08/16/2014    Procedure: LEFT HEART CATHETERIZATION WITH CORONARY ANGIOGRAM;  Surgeon: Sinclair Grooms, MD;  Location: [facility] CATH LAB;  Service: Cardiovascular;  Laterality: N/A;    SOCIAL HISTORY:   Social History   Social History  . Marital Status: Married    Spouse Name: N/A  . Number of Children: N/A  . Years of Education: N/A   Occupational History  . Retired Merchant [employer] officer)     Social History Main Topics  . Smoking status: Former Smoker    Start date: 01/10/2010  . Smokeless tobacco: Never Used  . Alcohol Use: No  . Drug Use: No  . Sexual Activity:    Partners: Female   Other Topics Concern  . 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   Marriage in good health.      Physician roster   Gyn- Helene Shoe, NP   Opthal- Dr Annamaria Boots (pediatric opthal)   GI- dr Adriana Simas- Dr Gladstone Lighter   ENT- Dr  Ernesto Rutherford             FAMILY HISTORY:   Family Status  Relation Status Death Age  . Mother Deceased 4    CAD  . Father Deceased 70    CAD  . Child Alive     2, healthy  . Sister Alive     CAD, premature  CAD    ROS:  A complete 10 system review of systems was obtained and was unremarkable apart from what is mentioned above.  PHYSICAL EXAMINATION:    VITALS:   Filed Vitals:   06/21/15 1015  BP: 110/72  Pulse: 74  Height: 5\' 2"  (1.575 m)  Weight: 140 lb (63.504 kg)    GEN:  Normal appears female in no acute distress.  Appears stated age. HEENT:  Normocephalic, atraumatic. The mucous membranes are moist. The superficial temporal arteries are without ropiness or tenderness.  There is head titubation in the "no" direction, but it is quite mild. Cardiovascular: Regular rate and rhythm. Lungs: Clear to auscultation bilaterally. Neck/Heme: There are no carotid bruits noted bilaterally.  NEUROLOGICAL: Orientation:  The patient is alert and oriented x 3.   Cranial nerves: She continues to have slight decreased nasolabial fold on the left.  Forehead wrinkle is symmetric. Extraocular muscles are intact and visual fields are full to confrontational testing.   Speech is fluent and clear. Soft palate rises symmetrically and there is no tongue deviation. Hearing is intact to conversational tone. Tone: Tone is good throughout. Sensation: Sensation is intact to light touch throughout Coordination:  The patient has no difficulty with RAM's or FNF bilaterally. Motor: Strength is 5/5 in the bilateral upper and lower extremities.  Shoulder shrug is equal and symmetric. There is no pronator drift.  There are no fasciculations noted. DTR's: Deep tendon reflexes are not tested today Gait and Station: The patient is able to ambulate without difficulty.  However, there is marked decreased arm swing bilaterally.  She has a negative pull test.  Stride length is good bilaterally. ABNL MOVEMNENTS:   There is mild left hemifacial spasm, including left blepharospasm.  She has a pull in the platysma and around the orbicularis oris on the L, along with zygomaticus.  She does have some mild tremor of the outstretched hands.   IMPRESSION/PLAN  1. left hemifacial spasm.  -This has progressed.  It is now involving the platysma on the left and she describes it on the corrugator on the left although I did not see it today.  Unfortunately, she had some droop from the lower orbicularis oculi last time.  She and I decided to not do the lateral lower orbicularis oculi and lower the canthus dose on the left.  I will add both corrugators, and cautiously add the zygomaticus and platysma.  She understands the risks and benefits.  She understands the black box warning.  She wishes to proceed.  I will get her scheduled. 2. mild essential tremor.  -DaT scan in June 2015 was normal  -This is very mild in the hands and rarely in the head as well.  Her mother had a tremor.  I would not recommend treating this at this time unless it becomes more bothersome. 3.  Total visit time today was 30 minutes.  Greater than 50% of the time was in counseling and coordinating care.

## 2015-07-04 ENCOUNTER — Ambulatory Visit (INDEPENDENT_AMBULATORY_CARE_PROVIDER_SITE_OTHER): Payer: Medicare Other | Admitting: Neurology

## 2015-07-04 VITALS — Temp 97.6°F

## 2015-07-04 DIAGNOSIS — G518 Other disorders of facial nerve: Secondary | ICD-10-CM | POA: Diagnosis not present

## 2015-07-04 DIAGNOSIS — G5139 Clonic hemifacial spasm, unspecified: Secondary | ICD-10-CM

## 2015-07-04 MED ORDER — ONABOTULINUMTOXINA 100 UNITS IJ SOLR
25.0000 [IU] | Freq: Once | INTRAMUSCULAR | Status: AC
  Administered 2015-07-04: 25 [IU] via INTRAMUSCULAR

## 2015-07-04 NOTE — Procedures (Signed)
[facility]   History:  Diagnosis: Hemifacial spasm (351.8)  Initial side: left   Result History    Adverse Effects: mild L ptosis Prior to todays visit, pt had requested that we alter injection and add playsma.  When we got into procedure room, however, she had changed mind and asked that we not do platysma and didn't want ANY injections over the upper lid or the lateral lower lid or nasalis.  Injections are in detail below.  Consent obtained from: The patient Benefits discussed included, but were not limited to decreased muscle tightness, increased joint range of motion, and decreased pain.  Risk discussed included, but were not limited pain and discomfort, bleeding, bruising, excessive weakness, venous thrombosis, muscle atrophy and dysphagia.  A copy of the patient medication guide was given to the patient which explains the blackbox warning.  Patients identity and treatment sites confirmed yes.  Details of Procedure: Skin was cleaned with alcohol.   Prior to injection, the needle plunger was aspirated to make sure the needle was not within a blood vessel.  There was no blood retrieved on aspiration.    Following is a summary of the muscles injected  And the amount of Botulinum toxin used:  Injections  Location Left  Right Units Number of sites        Corrugator 2.5 2.5 5.0   Frontalis      Lower Lid, Lateral       Lower Lid Medial  2.5    2.5    Upper Lid, Lateral       Upper Lid, Medial      Canthus 2.5 2.5 5.0   Nasalis      Masseter      Procerus      Zygomaticus Major 2.5  2.5   TOTAL UNITS:   20.0    Agent: Botulinum Type A ( Onobotulinum Toxin type A ).  1 vials of Botox were used, each containing 50 units and freshly diluted with 2 mL of sterile, non-perserved saline   Total injected (Units): 15  Total wasted (Units): 5 Pt tolerated procedure well without complications.   Reinjection is anticipated in 3 months.

## 2015-07-25 DIAGNOSIS — R635 Abnormal weight gain: Secondary | ICD-10-CM | POA: Insufficient documentation

## 2015-07-25 DIAGNOSIS — M6740 Ganglion, unspecified site: Secondary | ICD-10-CM | POA: Insufficient documentation

## 2015-08-15 ENCOUNTER — Telehealth: Payer: Self-pay | Admitting: Neurology

## 2015-08-15 NOTE — Telephone Encounter (Signed)
I would agree.  I greatly decreased botox amt at her request last visit

## 2015-08-15 NOTE — Telephone Encounter (Signed)
Patient called back - she states she is better after Botox injection- not as frequently but still having some facial spasm. She states she may need increased amount next visit. She will keep scheduled appt and call with any problems or questions prior.

## 2015-08-15 NOTE — Telephone Encounter (Signed)
Left message on machine for patient to call back. To see how she is doing after Botox injections.

## 2015-08-16 ENCOUNTER — Encounter: Payer: Self-pay | Admitting: Internal Medicine

## 2015-08-16 ENCOUNTER — Ambulatory Visit (INDEPENDENT_AMBULATORY_CARE_PROVIDER_SITE_OTHER): Payer: Medicare Other | Admitting: Internal Medicine

## 2015-08-16 VITALS — BP 110/68 | HR 70 | Temp 98.1°F | Resp 14 | Ht 62.0 in | Wt 141.6 lb

## 2015-08-16 DIAGNOSIS — E0580 Other thyrotoxicosis without thyrotoxic crisis or storm: Secondary | ICD-10-CM | POA: Diagnosis not present

## 2015-08-16 DIAGNOSIS — R5382 Chronic fatigue, unspecified: Secondary | ICD-10-CM | POA: Diagnosis not present

## 2015-08-16 NOTE — Patient Instructions (Addendum)
Please stop the multivitamins and come back for labs on Monday.  We will schedule a new appt if the results are abnormal.

## 2015-08-16 NOTE — Progress Notes (Signed)
Patient ID: [name], female   DOB: 25-Oct-1945, 70 y.o.   MRN: [id number]   HPI  [name] is a 70 y.o.-year-old female, referred by her PCP, Dr. 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  She noticed - hair loss over her entire body ~70 y/o - weight gain 114>>141- despite 40 min exercise every day, good diet- no dairy, no fried foods, no bread/pasta, mostly chicken and veggies. She believes she is eating 500 cal/day. - Fatigue >> needs to take a nap in the pm every second day - brain fog - past 6-7 mo - dry skin - brittle hair - cold and heat intolerance  I reviewed pt's thyroid tests: 07/26/2015: TSH 0.76, free T4 1.26 (0.61-1.12), total T3 115 (71-180) Lab Results  Component Value Date   TSH 0.94 08/23/2012   TSH 0.56 08/03/2011   TSH 0.67 04/24/2010   TSH 0.57 08/08/2009   FREET4 0.8 08/08/2009    Pt denies feeling nodules in neck, hoarseness, dysphagia/odynophagia, SOB with lying down.  She gets steroid injections every 6 mo - spine (DDD) - for years.  Reviewed labs brought by pt (07/25/2015): CBC, CMP normal (except Aphos 30, low), LDL 174.  Pt does have a FH of thyroid ds  - hypothyroidism. No FH of thyroid cancer. No h/o radiation tx to head or neck.  No seaweed or kelp, no recent contrast studies. No herbal supplements. No Biotin use, takes a MVI daily in am.  ROS: Constitutional: see HPI, + nocturia Eyes: + blurry vision, no xerophthalmia ENT: no sore throat, no nodules palpated in throat, no dysphagia/odynophagia, no hoarseness Cardiovascular: no CP/SOB/palpitations/leg swelling Respiratory: no cough/SOB Gastrointestinal: no N/V/D/C Musculoskeletal: + Both: muscle/joint aches Skin: no rashes, + hair loss Neurological: + tremors/no numbness/tingling/dizziness Psychiatric: no depression/+ anxiety + Low libido  Past Medical History  Diagnosis Date  . Asthma   . Irritable bowel syndrome   . Hyperlipidemia   . History of migraines   .  Depression   . Anxiety   . CAD (coronary artery disease), native coronary artery    Past Surgical History  Procedure Laterality Date  . Partial colectomy      in her 20's  . Correction of esotropic  June 06', May '10  . Cholecystectomy    . Arm lift  09-23-12  . Breast enhancement surgery    . Left heart catheterization with coronary angiogram N/A 08/16/2014    Procedure: LEFT HEART CATHETERIZATION WITH CORONARY ANGIOGRAM;  Surgeon: Sinclair Grooms, MD;  Location: [facility] CATH LAB;  Service: Cardiovascular;  Laterality: N/A;   Social History   Social History  . Marital Status: Married    Spouse Name: N/A  . Number of Children: 2   Occupational History  . Retired Merchant [employer] officer)     Social History Main Topics  . Smoking status: Former Smoker    Start date: 01/10/2010  . Smokeless tobacco: Never Used  . Alcohol Use: No  . 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   Marriage in good health.      Physician roster   Gyn- Helene Shoe, NP   Opthal- Dr Annamaria Boots (pediatric opthal)   GI- dr Adriana Simas- Dr Gladstone Lighter   ENT- Dr Ernesto Rutherford   Current Outpatient Prescriptions  on File Prior to Visit  Medication Sig Dispense Refill  . ALPRAZolam (XANAX) 0.5 MG tablet TAKE ONE TABLET BY MOUTH TWICE DAILY 60 tablet 5  . FLUoxetine (PROZAC) 40 MG capsule TAKE ONE CAPSULE BY MOUTH ONCE DAILY 30 capsule 0  . Multiple Vitamin (MULTI-VITAMINS) TABS Take 1 tablet by mouth daily.     . SUMAtriptan (IMITREX) 6 MG/0.5ML SOLN injection Inject 0.5 mLs (6 mg total) into the skin once. Subcutaneously injection as needed migraine 1 vial 3  . traZODone (DESYREL) 50 MG tablet TAKE 1 TABLET BY MOUTH AT BEDTIME (QUALITEST BRAND ONLY [phone]) 30 tablet 5   No current facility-administered medications on file prior to  visit.   Allergies  Allergen Reactions  . Codeine Nausea And Vomiting  . Shellfish Allergy Swelling    Throat swells   Family History  Problem Relation Age of Onset  . Heart disease Mother     CHF  . Coronary artery disease Mother   . Hypertension Mother   . Hyperlipidemia Mother   . Heart disease Father     CHF  . Coronary artery disease Father   . Diabetes Sister     with all the complications  . Coronary artery disease Sister   . Stroke Sister 59    CVA  . Peripheral vascular disease Sister   . Cancer Neg Hx     breast or colon  . Colon cancer Maternal Grandfather    PE: BP 110/68 mmHg  Pulse 70  Temp(Src) 98.1 F (36.7 C)  Resp 14  Ht 5\' 2"  (1.575 m)  Wt 141 lb 9.6 oz (64.229 kg)  BMI 25.89 kg/m2  SpO2 95% Wt Readings from Last 3 Encounters:  08/16/15 141 lb 9.6 oz (64.229 kg)  06/21/15 140 lb (63.504 kg)  08/16/14 134 lb (60.782 kg)   Constitutional: overweight, in NAD Eyes: PERRLA, EOMI, no exophthalmos, no lid lag, no stare ENT: moist mucous membranes, no thyromegaly, no thyroid bruits, no cervical lymphadenopathy Cardiovascular: RRR, No MRG Respiratory: CTA B Gastrointestinal: abdomen soft, NT, ND, BS+ Musculoskeletal: no deformities, strength intact in all 4 Skin: moist, warm, no rashes Neurological: no tremor with outstretched hands, DTR normal in all 4  ASSESSMENT: 1. T4 Thyrotoxicosis  PLAN:  1. Patient with a recently found slightly elevated free T4, with normal TSH and free T3, while she previously had normal TFTs. She has multiple sxs, but no weight loss, hyperdefecation, palpitations. She has mostly cold intolerance, but only occasionally heat intolerance, she also has hair loss and brain fog. The clinical picture is not completely consistent with hyper or hypo-thyroidism. We discussed about the fact that her many steroid injections over the years could have been contributing to many of her symptoms: Weight gain, fatigue, brain fog, heat  intolerance >> I advised her to try to stay away from these if possible. - she does not appear to have exogenous causes for thyrotoxicosis. She is taking multivitamins that contain biotin, and she took them before last set of labs were drawn. Biotin is known to be able to artificially lower TSH and elevated free T4. - We discussed that possible causes of thyrotoxicosis are:  Graves ds   Thyroiditis toxic multinodular goiter/ toxic adenoma (I cannot feel nodules at palpation of her thyroid). - I suggested that we check the TSH, fT3 and fT4 and also add thyroid stimulating antibodies to screen for Graves' disease and TPO and ATA antibodies to screen for Hashimoto's disease, But we will check these after  the weekend, after she has been off her multivitamins for 3 days. - If the tests remain abnormal, we may need an uptake and scan to differentiate between the 3 above possible etiologies  - I do not feel that we need to add beta blockers at this time, since she is not tachycardic, anxious, or tremulous - RTC prn if labs remain abnormal  Component     Latest Ref Rng 08/19/2015  T4,Free(Direct)     0.60 - 1.60 ng/dL 1.04  Triiodothyronine,Free,Serum     2.3 - 4.2 pg/mL 3.2  TSH     0.35 - 4.50 uIU/mL 0.67  Thyroperoxidase Ab SerPl-aCnc     <9 IU/mL <1  Thyroglobulin Ab     <2 IU/mL <1  TSI     <140 % baseline 51   Labs normal. No further investigation needed for now.

## 2015-08-19 ENCOUNTER — Other Ambulatory Visit (INDEPENDENT_AMBULATORY_CARE_PROVIDER_SITE_OTHER): Payer: Medicare Other

## 2015-08-19 DIAGNOSIS — R5382 Chronic fatigue, unspecified: Secondary | ICD-10-CM | POA: Diagnosis not present

## 2015-08-19 DIAGNOSIS — E0580 Other thyrotoxicosis without thyrotoxic crisis or storm: Secondary | ICD-10-CM | POA: Diagnosis not present

## 2015-08-19 LAB — FERRITIN: Ferritin: 48.4 ng/mL (ref 10.0–291.0)

## 2015-08-19 LAB — VITAMIN D 25 HYDROXY (VIT D DEFICIENCY, FRACTURES): VITD: 41.72 ng/mL (ref 30.00–100.00)

## 2015-08-19 LAB — TSH: TSH: 0.67 u[IU]/mL (ref 0.35–4.50)

## 2015-08-19 LAB — T3, FREE: T3, Free: 3.2 pg/mL (ref 2.3–4.2)

## 2015-08-19 LAB — T4, FREE: Free T4: 1.04 ng/dL (ref 0.60–1.60)

## 2015-08-19 LAB — VITAMIN B12: Vitamin B-12: 514 pg/mL (ref 211–911)

## 2015-08-20 LAB — THYROID PEROXIDASE ANTIBODY: Thyroperoxidase Ab SerPl-aCnc: <1 IU/mL (ref <9)

## 2015-08-20 LAB — THYROGLOBULIN ANTIBODY: Thyroglobulin Ab: <1 IU/mL (ref <2)

## 2015-08-22 ENCOUNTER — Telehealth: Payer: Self-pay | Admitting: Internal Medicine

## 2015-08-22 NOTE — Telephone Encounter (Signed)
Patient is calling for the results of labs.

## 2015-08-22 NOTE — Telephone Encounter (Signed)
Returned pt's call. Advised her per Dr Arman Filter MyChart message. Pt voiced understanding.

## 2015-08-26 LAB — THYROID STIMULATING IMMUNOGLOBULIN: TSI: 51 % baseline (ref <140)

## 2015-10-04 ENCOUNTER — Ambulatory Visit: Payer: Medicare Other | Admitting: Neurology

## 2015-10-04 ENCOUNTER — Telehealth: Payer: Self-pay | Admitting: Neurology

## 2015-10-04 NOTE — Telephone Encounter (Signed)
Patient wanted to see about going ahead and coming in today. Aware Dr Tat is gone for the day.

## 2015-10-04 NOTE — Telephone Encounter (Signed)
Spoke with patient. She states she can not wait for next Botox date (11/28/15) to be injected. She cancelled her appt today due to having a fever. Aware that is our next scheduled Botox day. I advised we could bring her in on a non-botox day but she would be charged for the entire vial of botox. She does not want to do this. She wants to talk with her PCP about going elsewhere for injections. FYI.

## 2015-10-04 NOTE — Telephone Encounter (Signed)
Patient [name] 05/26/2046 called wanting to see about getting an earlier appointment for Botox.  Her contact number is [phone]. Thank you

## 2015-10-04 NOTE — Telephone Encounter (Signed)
Pt called and needs a call back/Dawn CB# [phone]

## 2015-10-06 NOTE — Telephone Encounter (Signed)
I'm sorry she feels that way.  I understand that she doesn't want to wait and have no problems getting her in, but if we open a vial of botox for her specifically with no one to split that vial with, then by law, we have to charge that to her/her insurance.  Botox only lasts for about 4-6 hours once it is opened/reconstituted so the options are little if she doesn't want to wait for another patient to split it with.  Jade, if you would let pcp know the issue so that she understands as well, that would be great.  Shes not in epic so I cannot copy her on this.

## 2015-10-07 NOTE — Telephone Encounter (Signed)
Patient's PCP is aware. Patient spoke with her on Friday and she advised her that if she went elsewhere it would be the same wait time. I will be cognizant of other provider's schedules and adding her on a day they could have Botox, but as of right now no one is scheduled that Botox can be split with.

## 2015-11-26 ENCOUNTER — Telehealth: Payer: Self-pay | Admitting: Neurology

## 2015-11-26 NOTE — Telephone Encounter (Signed)
Left message with husband to see if patient wanted to move appt on Thursday to 9:45 am or 11:15 am. Awaiting call back.

## 2015-11-26 NOTE — Telephone Encounter (Signed)
-----   Message from Nolan, DO sent at 11/26/2015 12:49 PM EDT ----- Is she coming for botox or f/u?  Schedule says follow up is visit type but I think that she is botox.  Will you change visit type if so.  Also, see if she can come in at the 11:15 slot where someone else cancelled.

## 2015-11-26 NOTE — Telephone Encounter (Signed)
Patient called back and appt moved to 9:45 am.

## 2015-11-28 ENCOUNTER — Ambulatory Visit: Payer: Medicare Other | Admitting: Neurology

## 2016-01-03 ENCOUNTER — Ambulatory Visit (INDEPENDENT_AMBULATORY_CARE_PROVIDER_SITE_OTHER): Payer: Medicare Other | Admitting: Neurology

## 2016-01-03 DIAGNOSIS — G245 Blepharospasm: Secondary | ICD-10-CM | POA: Diagnosis not present

## 2016-01-03 DIAGNOSIS — G518 Other disorders of facial nerve: Secondary | ICD-10-CM

## 2016-01-03 DIAGNOSIS — G5139 Clonic hemifacial spasm, unspecified: Secondary | ICD-10-CM

## 2016-01-03 MED ORDER — ONABOTULINUMTOXINA 100 UNITS IJ SOLR
25.0000 [IU] | Freq: Once | INTRAMUSCULAR | Status: AC
  Administered 2016-01-03: 25 [IU] via INTRAMUSCULAR

## 2016-01-03 MED ORDER — ONABOTULINUMTOXINA 100 UNITS IJ SOLR
15.0000 [IU] | Freq: Once | INTRAMUSCULAR | Status: AC
  Administered 2016-01-03: 15 [IU] via INTRAMUSCULAR

## 2016-01-03 MED ORDER — ONABOTULINUMTOXINA 100 UNITS IJ SOLR
2.5000 [IU] | Freq: Once | INTRAMUSCULAR | Status: AC
  Administered 2016-01-03: 2.5 [IU] via INTRAMUSCULAR

## 2016-01-03 NOTE — Procedures (Signed)
[facility]   History:  Diagnosis: Hemifacial spasm (351.8)  Initial side: left   Result History    Adverse Effects: mild L ptosis Clinical note:  Pt has not had injections for long time (6 months).  More pull of the L cheek and occasional twitch on the right.  Asks me to do forehead/procerus for symmetry and also because of wrinkle lines.  Consent obtained from: The patient Benefits discussed included, but were not limited to decreased muscle tightness, increased joint range of motion, and decreased pain.  Risk discussed included, but were not limited pain and discomfort, bleeding, bruising, excessive weakness, venous thrombosis, muscle atrophy and dysphagia.  A copy of the patient medication guide was given to the patient which explains the blackbox warning.  Patients identity and treatment sites confirmed yes.  Details of Procedure: Skin was cleaned with alcohol.   Prior to injection, the needle plunger was aspirated to make sure the needle was not within a blood vessel.  There was no blood retrieved on aspiration.    Following is a summary of the muscles injected  And the amount of Botulinum toxin used:  Injections  Location Left  Right Units Number of sites        Corrugator 2.5 2.5 5.0   Procerus 2.5 2.5 5.0   Lower Lid, Lateral       Lower Lid Medial  2.5    2.5    Upper Lid, Lateral 2.5   2.5   Upper Lid, Medial      Canthus 2.5 2.5 5.0   Nasalis      Masseter      Procerus      Zygomaticus Major 2.5 2.5 5.0   TOTAL UNITS:   25.0    Agent: Botulinum Type A ( Onobotulinum Toxin type A ).  1 vials of Botox were used, each containing 50 units and freshly diluted with 2 mL of sterile, non-perserved saline   Total injected (Units): 25  Total wasted (Units): 15 Pt tolerated procedure well without complications.   Reinjection is anticipated in 3 months.

## 2016-02-21 ENCOUNTER — Ambulatory Visit: Payer: Medicare Other | Admitting: Neurology

## 2016-04-03 ENCOUNTER — Ambulatory Visit (INDEPENDENT_AMBULATORY_CARE_PROVIDER_SITE_OTHER): Payer: Medicare Other | Admitting: Neurology

## 2016-04-03 DIAGNOSIS — G518 Other disorders of facial nerve: Secondary | ICD-10-CM | POA: Diagnosis not present

## 2016-04-03 DIAGNOSIS — G5139 Clonic hemifacial spasm, unspecified: Secondary | ICD-10-CM

## 2016-04-03 MED ORDER — ONABOTULINUMTOXINA 100 UNITS IJ SOLR
22.5000 [IU] | Freq: Once | INTRAMUSCULAR | Status: AC
  Administered 2016-04-03: 22.5 [IU] via INTRAMUSCULAR

## 2016-04-03 MED ORDER — ONABOTULINUMTOXINA 100 UNITS IJ SOLR: 25.0000 [IU] | Freq: Once | INTRAMUSCULAR | Status: DC

## 2016-04-03 NOTE — Procedures (Signed)
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  Risk discussed included, but were not limited pain and discomfort, bleeding, bruising, excessive weakness, venous thrombosis, muscle atrophy and dysphagia.  A copy of the patient medication guide was given to the patient which explains the blackbox warning.  Patients identity and treatment sites confirmed yes.  Details of Procedure: Skin was cleaned with alcohol.   Prior to injection, the needle plunger was aspirated to make sure the needle was not within a blood vessel.  There was no blood retrieved on aspiration.    Following is a summary of the muscles injected  And the amount of Botulinum toxin used:  Injections  Location Left  Right Units Number of sites        Corrugator 2.5  2.5   Procerus 2.5 2.5 5.0   Lower Lid, Lateral       Lower Lid Medial  2.5    2.5    Upper Lid, Lateral 2.5   2.5   Upper Lid, Medial      Canthus 2.5 2.5 5.0   Nasalis      Masseter      Procerus      Zygomaticus Major 2.5 2.5 5.0   TOTAL UNITS:   22.5    Agent: Botulinum Type A ( Onobotulinum Toxin type A ).  1 vials of Botox were used, each containing 50 units and freshly diluted with 2 mL of sterile, non-perserved saline   Total injected (Units): 22.5  Total wasted (Units): 15 Pt tolerated procedure well without complications.   Reinjection is anticipated in 3 months.

## 2016-04-22 ENCOUNTER — Encounter: Payer: Self-pay | Admitting: Cardiovascular Disease

## 2016-04-23 ENCOUNTER — Telehealth: Payer: Self-pay | Admitting: Neurology

## 2016-04-23 NOTE — Telephone Encounter (Signed)
PT called and said that her face is still twitching/Dawn CB# [phone]

## 2016-04-23 NOTE — Telephone Encounter (Signed)
Left message on machine for patient to call back.

## 2016-04-24 NOTE — Telephone Encounter (Signed)
Spoke with patient. She states not doing as well since last Botox. The twitching is not as frequent as without Botox completely, but severity is about the same. Has 1-2 episodes daily of right side of face twitching. She just wanted to make [place] aware. She is aware we can not repeat Botox until 3 months.

## 2016-05-05 ENCOUNTER — Ambulatory Visit (INDEPENDENT_AMBULATORY_CARE_PROVIDER_SITE_OTHER): Payer: Medicare Other | Admitting: Cardiovascular Disease

## 2016-05-05 ENCOUNTER — Encounter: Payer: Self-pay | Admitting: Cardiovascular Disease

## 2016-05-05 VITALS — BP 110/80 | HR 70 | Ht 61.5 in | Wt 142.2 lb

## 2016-05-05 DIAGNOSIS — I2510 Atherosclerotic heart disease of native coronary artery without angina pectoris: Secondary | ICD-10-CM | POA: Diagnosis not present

## 2016-05-05 DIAGNOSIS — R079 Chest pain, unspecified: Secondary | ICD-10-CM | POA: Diagnosis not present

## 2016-05-05 MED ORDER — CARVEDILOL 3.125 MG PO TABS: 3.1250 mg | ORAL_TABLET | Freq: Two times a day (BID) | ORAL | 11 refills | Status: DC

## 2016-05-05 NOTE — Patient Instructions (Signed)
Medication Instructions:  START Carvedilol (Coreg) 3.125 mg twice daily   Labwork: None Ordered   Testing/Procedures: Your physician has requested that you have an echocardiogram. Echocardiography is a painless test that uses sound waves to create images of your heart. It provides your doctor with information about the size and shape of your heart and how well your heart's chambers and valves are working. This procedure takes approximately one hour. There are no restrictions for this procedure.   Follow-Up: Your physician recommends that you schedule a follow-up appointment in: 6 weeks with Dr. Acie Fredrickson   If you need a refill on your cardiac medications before your next appointment, please call your pharmacy.   Thank you for choosing [facility]! Christen Bame, RN [phone]

## 2016-05-05 NOTE — Progress Notes (Signed)
Cardiology Office Note   Date:  05/05/2016   ID:  [name], DOB 04/19/1946, MRN [id number]  PCP:  Gerrit Heck, MD  Cardiologist:   Mertie Moores, MD   Chief Complaint  Patient presents with  . Chest Pain   Problem List 1. Chest pain     Oct. 24, 2017:   [name] is a 70 y.o. female who presents for exertional dyspnea / heaviness  started several years ago. Has seen Dr. Wynonia Lawman in the past.  Abnormal stress test  Cath Feb. 4, 2016 showed mid LAD stenosis of 40-50%.    she has exertional chest tightness.  Mid sternal pain, radiates to back of both arms, radiates up into her neck and then to upper chest   Has to stop and rest for the pain to go away.   Walking uphill is worse.   Occurs more often if she has eaten and then exercises.  Has been active and healthy,  Walks regularly . Walked 2 miles a day and has had to slow down. Has slowly gained weight but no sudden weight gain   Past Medical History:  Diagnosis Date  . Anxiety   . Asthma   . CAD (coronary artery disease), native coronary artery   . Depression   . History of migraines   . Hyperlipidemia   . Irritable bowel syndrome     Past Surgical History:  Procedure Laterality Date  . Arm Lift  09-23-12  . BREAST ENHANCEMENT SURGERY    . CHOLECYSTECTOMY    . Correction of Esotropic  June 06', May '10  . LEFT HEART CATHETERIZATION WITH CORONARY ANGIOGRAM N/A 08/16/2014   Procedure: LEFT HEART CATHETERIZATION WITH CORONARY ANGIOGRAM;  Surgeon: Sinclair Grooms, MD;  Location: [facility] CATH LAB;  Service: Cardiovascular;  Laterality: N/A;  . PARTIAL COLECTOMY     in her 20's     Current Outpatient Prescriptions  Medication Sig Dispense Refill  . ALPRAZolam (XANAX) 0.5 MG tablet TAKE ONE TABLET BY MOUTH TWICE DAILY 60 tablet 5  . atorvastatin (LIPITOR) 20 MG tablet Take 20 mg by mouth daily.    Marland Kitchen FLUoxetine (PROZAC) 40 MG capsule TAKE ONE CAPSULE BY MOUTH ONCE DAILY 30 capsule 0  .  lactobacillus acidophilus (BACID) TABS tablet Take 2 tablets by mouth 3 (three) times daily.    . Multiple Vitamin (MULTI-VITAMINS) TABS Take 1 tablet by mouth daily.     . SUMAtriptan (IMITREX) 6 MG/0.5ML SOLN injection Inject 0.5 mLs (6 mg total) into the skin once. Subcutaneously injection as needed migraine 1 vial 3  . traZODone (DESYREL) 50 MG tablet TAKE 1 TABLET BY MOUTH AT BEDTIME (QUALITEST BRAND ONLY [phone]) 30 tablet 5   Current Facility-Administered Medications  Medication Dose Route Frequency Provider Last Rate Last Dose  . botulinum toxin Type A (BOTOX) injection 25 Units  25 Units Intramuscular Once Rebecca S Tat, DO        Allergies:   Codeine; Shellfish allergy; Amoxicillin-pot clavulanate; Other; and Rosuvastatin calcium    Social History:  The patient  reports that she has quit smoking. She started smoking about 6 years ago. She has never used smokeless tobacco. She reports that she does not drink alcohol or use drugs.   Family History:  The patient's family history includes Colon cancer in her maternal grandfather; Coronary artery disease in her father, mother, and sister; Diabetes in her sister; Heart disease in her father and mother; Hyperlipidemia in her mother; Hypertension in  her mother; Peripheral vascular disease in her sister; Stroke (age of onset: 4) in her sister.    ROS:  Please see the history of present illness.    Review of Systems: Constitutional:  denies fever, chills, diaphoresis, appetite change and fatigue.  HEENT: denies photophobia, eye pain, redness, hearing loss, ear pain, congestion, sore throat, rhinorrhea, sneezing, neck pain, neck stiffness and tinnitus.  Respiratory: denies SOB, DOE, cough, chest tightness, and wheezing.  Cardiovascular: admits to chest pain, palpitations and leg swelling.  Gastrointestinal: denies nausea, vomiting, abdominal pain, diarrhea, constipation, blood in stool.  Genitourinary: denies dysuria, urgency,  frequency, hematuria, flank pain and difficulty urinating.  Musculoskeletal: denies  myalgias, back pain, joint swelling, arthralgias and gait problem.   Skin: denies pallor, rash and wound.  Neurological: denies dizziness, seizures, syncope, weakness, light-headedness, numbness and headaches.   Hematological: denies adenopathy, easy bruising, personal or family bleeding history.  Psychiatric/ Behavioral: denies suicidal ideation, mood changes, confusion, nervousness, sleep disturbance and agitation.       All other systems are reviewed and negative.    PHYSICAL EXAM: VS:  BP 110/80   Pulse 70   Ht 5' 1.5" (1.562 m)   Wt 142 lb 3.2 oz (64.5 kg)   SpO2 93%   BMI 26.43 kg/m  , BMI Body mass index is 26.43 kg/m. GEN: Well nourished, well developed, in no acute distress  HEENT: normal  Neck: no JVD, carotid bruits, or masses Cardiac: RRR; no murmurs, rubs, or gallops,no edema  Respiratory:  clear to auscultation bilaterally, normal work of breathing GI: soft, nontender, nondistended, + BS MS: no deformity or atrophy  Skin: warm and dry, no rash Neuro:  Strength and sensation are intact Psych: normal   EKG:  EKG is ordered today. The ekg ordered today demonstrates  NSR at 73.   ST T wave changes in inferior and lateral leads   Recent Labs: 08/19/2015: TSH 0.67    Lipid Panel    Component Value Date/Time   CHOL 189 04/24/2010 1413   TRIG 66.0 04/24/2010 1413   HDL 61.30 04/24/2010 1413   CHOLHDL 3 04/24/2010 1413   VLDL 13.2 04/24/2010 1413   LDLCALC 115 (H) 04/24/2010 1413   LDLDIRECT 125.2 08/08/2009 1600      Wt Readings from Last 3 Encounters:  05/05/16 142 lb 3.2 oz (64.5 kg)  08/16/15 141 lb 9.6 oz (64.2 kg)  06/21/15 140 lb (63.5 kg)      Other studies Reviewed: Additional studies/ records that were reviewed today include: . Review of the above records demonstrates:    ASSESSMENT AND PLAN:  1.  Chest pain: [name]  presents for further evaluation of  her chest discomfort. She's had a stress test in the past which was abnormal. She had a CT and gram of her coronary arteries which revealed Mixed disease in the proximal LAD.  She had a heart catheterization by Dr. Tamala Julian that revealed a very minimal proximal LAD stenosis.   I have personally reviewed the angina grams from that cardiac catheterization. Her left ventriculogram did show a hyperdynamic left ventricle.  She has exertional symptoms that are worrisome from it for angina but her coronary angina grams show no significant disease. She may have some diastolic dysfunction. I'll start her on carvedilol 3.125 mg twice a day. We'll get an echocardiogram. I'll see her back in 6 weeks for a follow-up visit.   Current medicines are reviewed at length with the patient today.  The patient does not have  concerns regarding medicines.  Labs/ tests ordered today include:  No orders of the defined types were placed in this encounter.    Disposition:   FU with me in 3 months      Mertie Moores, MD  05/05/2016 11:48 AM    [address], [place],   [address] Phone: [phone]; Fax: [phone]   [facility]  [address] [place],   [address] [phone]   Fax [phone]

## 2016-05-22 ENCOUNTER — Other Ambulatory Visit: Payer: Self-pay

## 2016-05-22 ENCOUNTER — Ambulatory Visit (HOSPITAL_COMMUNITY): Payer: Medicare Other | Attending: Cardiovascular Disease

## 2016-05-22 DIAGNOSIS — I071 Rheumatic tricuspid insufficiency: Secondary | ICD-10-CM | POA: Insufficient documentation

## 2016-05-22 DIAGNOSIS — R079 Chest pain, unspecified: Secondary | ICD-10-CM

## 2016-05-22 DIAGNOSIS — I313 Pericardial effusion (noninflammatory): Secondary | ICD-10-CM | POA: Insufficient documentation

## 2016-06-02 ENCOUNTER — Encounter: Payer: Self-pay | Admitting: Cardiovascular Disease

## 2016-06-03 ENCOUNTER — Encounter: Payer: Self-pay | Admitting: Cardiovascular Disease

## 2016-06-10 ENCOUNTER — Ambulatory Visit (INDEPENDENT_AMBULATORY_CARE_PROVIDER_SITE_OTHER): Payer: Medicare Other | Admitting: Cardiovascular Disease

## 2016-06-10 ENCOUNTER — Encounter: Payer: Self-pay | Admitting: Cardiovascular Disease

## 2016-06-10 VITALS — BP 106/86 | HR 68 | Ht 61.5 in | Wt 143.8 lb

## 2016-06-10 DIAGNOSIS — E7800 Pure hypercholesterolemia, unspecified: Secondary | ICD-10-CM

## 2016-06-10 DIAGNOSIS — I2510 Atherosclerotic heart disease of native coronary artery without angina pectoris: Secondary | ICD-10-CM | POA: Diagnosis not present

## 2016-06-10 NOTE — Patient Instructions (Signed)

## 2016-06-10 NOTE — Progress Notes (Signed)
Cardiology Office Note   Date:  06/10/2016   ID:  [name], DOB Aug 02, 1945, MRN [id number]  PCP:  Gerrit Heck, MD  Cardiologist:   Mertie Moores, MD   Chief Complaint  Patient presents with  . Chest Pain   Problem List 1. Chest pain     Oct. 24, 2017:   [name] is a 70 y.o. female who presents for exertional dyspnea / heaviness  started several years ago. Has seen Dr. Wynonia Lawman in the past.  Abnormal stress test  Cath Feb. 4, 2016 showed mid LAD stenosis of 40-50%.    she has exertional chest tightness.  Mid sternal pain, radiates to back of both arms, radiates up into her neck and then to upper chest   Has to stop and rest for the pain to go away.   Walking uphill is worse.   Occurs more often if she has eaten and then exercises.  Has been active and healthy,  Walks regularly . Walked 2 miles a day and has had to slow down. Has slowly gained weight but no sudden weight gain   Nov. 29, 2017:  [name] is doing ok Still having some CP especially if she eats after exercising .  Has CP if she exercises 30 minutes - 1 hour after eat The CP is a tightness in her throat, radiates to her neck, out  through to her shoulders and down both arms  The symptoms are better after starting carvedilol.   The symptoms do still occur though.   Past Medical History:  Diagnosis Date  . Anxiety   . Asthma   . CAD (coronary artery disease), native coronary artery   . Depression   . History of migraines   . Hyperlipidemia   . Irritable bowel syndrome     Past Surgical History:  Procedure Laterality Date  . Arm Lift  09-23-12  . BREAST ENHANCEMENT SURGERY    . CHOLECYSTECTOMY    . Correction of Esotropic  June 06', May '10  . LEFT HEART CATHETERIZATION WITH CORONARY ANGIOGRAM N/A 08/16/2014   Procedure: LEFT HEART CATHETERIZATION WITH CORONARY ANGIOGRAM;  Surgeon: Sinclair Grooms, MD;  Location: [facility] CATH LAB;  Service: Cardiovascular;  Laterality: N/A;    . PARTIAL COLECTOMY     in her 20's     Current Outpatient Prescriptions  Medication Sig Dispense Refill  . ALPRAZolam (XANAX) 0.5 MG tablet TAKE ONE TABLET BY MOUTH TWICE DAILY 60 tablet 5  . atorvastatin (LIPITOR) 20 MG tablet Take 20 mg by mouth daily.    . carvedilol (COREG) 3.125 MG tablet Take 1 tablet (3.125 mg total) by mouth 2 (two) times daily. 60 tablet 11  . FLUoxetine (PROZAC) 40 MG capsule TAKE ONE CAPSULE BY MOUTH ONCE DAILY 30 capsule 0  . lactobacillus acidophilus (BACID) TABS tablet Take 2 tablets by mouth 3 (three) times daily.    . Multiple Vitamin (MULTI-VITAMINS) TABS Take 1 tablet by mouth daily.     . SUMAtriptan (IMITREX) 6 MG/0.5ML SOLN injection Inject 0.5 mLs (6 mg total) into the skin once. Subcutaneously injection as needed migraine 1 vial 3  . traZODone (DESYREL) 50 MG tablet TAKE 1 TABLET BY MOUTH AT BEDTIME (QUALITEST BRAND ONLY [phone]) 30 tablet 5   Current Facility-Administered Medications  Medication Dose Route Frequency Provider Last Rate Last Dose  . botulinum toxin Type A (BOTOX) injection 25 Units  25 Units Intramuscular Once Eustace Quail Tat, DO  Allergies:   Codeine; Shellfish allergy; Amoxicillin-pot clavulanate; Other; and Rosuvastatin calcium    Social History:  The patient  reports that she has quit smoking. She started smoking about 6 years ago. She has never used smokeless tobacco. She reports that she does not drink alcohol or use drugs.   Family History:  The patient's family history includes Colon cancer in her maternal grandfather; Coronary artery disease in her father, mother, and sister; Diabetes in her sister; Heart disease in her father and mother; Hyperlipidemia in her mother; Hypertension in her mother; Peripheral vascular disease in her sister; Stroke (age of onset: 47) in her sister.    ROS:  Please see the history of present illness.    Review of Systems: Constitutional:  denies fever, chills, diaphoresis,  appetite change and fatigue.  HEENT: denies photophobia, eye pain, redness, hearing loss, ear pain, congestion, sore throat, rhinorrhea, sneezing, neck pain, neck stiffness and tinnitus.  Respiratory: denies SOB, DOE, cough, chest tightness, and wheezing.  Cardiovascular: admits to chest pain, palpitations and leg swelling.  Gastrointestinal: denies nausea, vomiting, abdominal pain, diarrhea, constipation, blood in stool.  Genitourinary: denies dysuria, urgency, frequency, hematuria, flank pain and difficulty urinating.  Musculoskeletal: denies  myalgias, back pain, joint swelling, arthralgias and gait problem.   Skin: denies pallor, rash and wound.  Neurological: denies dizziness, seizures, syncope, weakness, light-headedness, numbness and headaches.   Hematological: denies adenopathy, easy bruising, personal or family bleeding history.  Psychiatric/ Behavioral: denies suicidal ideation, mood changes, confusion, nervousness, sleep disturbance and agitation.       All other systems are reviewed and negative.    PHYSICAL EXAM: VS:  BP 106/86 (BP Location: Right Arm, Patient Position: Sitting, Cuff Size: Normal)   Pulse 68   Ht 5' 1.5" (1.562 m)   Wt 143 lb 12.8 oz (65.2 kg)   BMI 26.73 kg/m  , BMI Body mass index is 26.73 kg/m. GEN: Well nourished, well developed, in no acute distress  HEENT: normal  Neck: no JVD, carotid bruits, or masses Cardiac: RRR; no murmurs, rubs, or gallops,no edema  Respiratory:  clear to auscultation bilaterally, normal work of breathing GI: soft, nontender, nondistended, + BS MS: no deformity or atrophy  Skin: warm and dry, no rash Neuro:  Strength and sensation are intact Psych: normal   EKG:  EKG is ordered today. The ekg ordered today demonstrates  NSR at 73.   ST T wave changes in inferior and lateral leads   Recent Labs: 08/19/2015: TSH 0.67    Lipid Panel    Component Value Date/Time   CHOL 189 04/24/2010 1413   TRIG 66.0 04/24/2010  1413   HDL 61.30 04/24/2010 1413   CHOLHDL 3 04/24/2010 1413   VLDL 13.2 04/24/2010 1413   LDLCALC 115 (H) 04/24/2010 1413   LDLDIRECT 125.2 08/08/2009 1600      Wt Readings from Last 3 Encounters:  06/10/16 143 lb 12.8 oz (65.2 kg)  05/05/16 142 lb 3.2 oz (64.5 kg)  08/16/15 141 lb 9.6 oz (64.2 kg)      Other studies Reviewed: Additional studies/ records that were reviewed today include: . Review of the above records demonstrates:    ASSESSMENT AND PLAN:  1.  Chest pain: [name]  presents for further evaluation of her chest discomfort. She's had a stress test in the past which was abnormal. She had a CT and gram of her coronary arteries which revealed Mixed disease in the proximal LAD.  She had a heart catheterization by Dr.  Tamala Julian that revealed a very minimal proximal LAD stenosis.   I have personally reviewed the angiograms from that cardiac catheterization. Her left ventriculogram did show a hyperdynamic left ventricle.  Her echocardiogram shows normal left ventricular systolic function. She does have grade 2 diastolic dysfunction. The pulmonary pressures are normal.  She has exertional symptoms that are worrisome from it for angina but her coronary angiograms show no significant disease. She has grade 2 diastolic dysfunction. She seems to be doing a little bit better on the carvedilol.  We'll continue the same medications and I will see her in 6 month.  Current medicines are reviewed at length with the patient today.  The patient does not have concerns regarding medicines.  Labs/ tests ordered today include:  No orders of the defined types were placed in this encounter.    Disposition:   FU with me in 6 months      Mertie Moores, MD  06/10/2016 9:53 AM    [facility] [address], [place], [place]  [address] Phone: [phone]; Fax: [phone]

## 2016-06-15 ENCOUNTER — Ambulatory Visit: Payer: Medicare Other | Admitting: Cardiovascular Disease

## 2016-06-26 ENCOUNTER — Ambulatory Visit: Payer: Medicare Other | Admitting: Neurology

## 2016-08-10 ENCOUNTER — Telehealth: Payer: Self-pay | Admitting: Neurology

## 2016-08-10 NOTE — Telephone Encounter (Signed)
Next Botox day is October 02, 2016. I have the patient scheduled at 10:45 am. Can you please let her know? Thank you.

## 2016-08-10 NOTE — Telephone Encounter (Signed)
Pt called and cancelled her Botox appointment on Friday and wants to reschedule for the following month, I cancelled the appointment but I will let you reschedule her/Dawn CB# [phone]

## 2016-08-14 ENCOUNTER — Ambulatory Visit: Payer: Medicare Other | Admitting: Neurology

## 2016-10-02 ENCOUNTER — Ambulatory Visit: Payer: Medicare Other | Admitting: Neurology

## 2016-10-26 ENCOUNTER — Encounter: Payer: Self-pay | Admitting: Cardiovascular Disease

## 2016-10-26 ENCOUNTER — Ambulatory Visit (INDEPENDENT_AMBULATORY_CARE_PROVIDER_SITE_OTHER): Payer: Medicare Other | Admitting: Cardiovascular Disease

## 2016-10-26 ENCOUNTER — Other Ambulatory Visit: Payer: Self-pay | Admitting: Cardiovascular Disease

## 2016-10-26 ENCOUNTER — Telehealth: Payer: Self-pay | Admitting: Cardiovascular Disease

## 2016-10-26 VITALS — BP 110/88 | HR 76 | Ht 61.5 in | Wt 141.1 lb

## 2016-10-26 DIAGNOSIS — I200 Unstable angina: Secondary | ICD-10-CM

## 2016-10-26 DIAGNOSIS — I25110 Atherosclerotic heart disease of native coronary artery with unstable angina pectoris: Secondary | ICD-10-CM | POA: Diagnosis not present

## 2016-10-26 DIAGNOSIS — I2510 Atherosclerotic heart disease of native coronary artery without angina pectoris: Secondary | ICD-10-CM

## 2016-10-26 DIAGNOSIS — E7800 Pure hypercholesterolemia, unspecified: Secondary | ICD-10-CM

## 2016-10-26 LAB — BASIC METABOLIC PANEL
BUN/Creatinine Ratio: 19 (ref 12–28)
BUN: 21 mg/dL (ref 8–27)
CO2: 24 mmol/L (ref 18–29)
Calcium: 9.8 mg/dL (ref 8.7–10.3)
Chloride: 101 mmol/L (ref 96–106)
Creatinine, Ser: 1.13 mg/dL — ABNORMAL HIGH (ref 0.57–1.00)
GFR calc Af Amer: 58 mL/min/{1.73_m2} — ABNORMAL LOW (ref >59)
GFR calc non Af Amer: 50 mL/min/{1.73_m2} — ABNORMAL LOW (ref >59)
Glucose: 97 mg/dL (ref 65–99)
Potassium: 5.4 mmol/L — ABNORMAL HIGH (ref 3.5–5.2)
Sodium: 142 mmol/L (ref 134–144)

## 2016-10-26 LAB — PROTIME-INR
INR: 1 (ref 0.8–1.2)
Prothrombin Time: 10.4 s (ref 9.1–12.0)

## 2016-10-26 LAB — CBC
Hematocrit: 41.5 % (ref 34.0–46.6)
Hemoglobin: 14 g/dL (ref 11.1–15.9)
MCH: 31.1 pg (ref 26.6–33.0)
MCHC: 33.7 g/dL (ref 31.5–35.7)
MCV: 92 fL (ref 79–97)
Platelets: 283 10*3/uL (ref 150–379)
RBC: 4.5 x10E6/uL (ref 3.77–5.28)
RDW: 12.2 % — ABNORMAL LOW (ref 12.3–15.4)
WBC: 8.9 10*3/uL (ref 3.4–10.8)

## 2016-10-26 MED ORDER — ATORVASTATIN CALCIUM 80 MG PO TABS: 80.0000 mg | ORAL_TABLET | Freq: Every day | ORAL | 3 refills | Status: DC

## 2016-10-26 MED ORDER — ASPIRIN EC 81 MG PO TBEC: 81.0000 mg | DELAYED_RELEASE_TABLET | Freq: Every day | ORAL | Status: DC

## 2016-10-26 NOTE — Telephone Encounter (Signed)
New Message    I have the patient coming in at 1140am today 10/26/16   Pt c/o of Chest Pain: STAT if CP now or developed within 24 hours  1. Are you having CP right now? Yes   2. Are you experiencing any other symptoms (ex. SOB, nausea, vomiting, sweating)? sob  3. How long have you been experiencing CP? 3-4 days  4. Is your CP continuous or coming and going? Comes and goes down neck and arm   5. Have you taken Nitroglycerin? yes ?

## 2016-10-26 NOTE — Progress Notes (Signed)
Cardiology Office Note   Date:  10/26/2016   ID:  [name], DOB 09-24-1945, MRN [id number]  PCP:  Gerrit Heck, MD  Cardiologist:   Mertie Moores, MD   Chief Complaint  Patient presents with  . Coronary Artery Disease   Problem List 1. Chest pain     Oct. 24, 2017:   [name] is a 71 y.o. female who presents for exertional dyspnea / heaviness  started several years ago. Has seen Dr. Wynonia Lawman in the past.  Abnormal stress test  Cath Feb. 4, 2016 showed mid LAD stenosis of 40-50%.    she has exertional chest tightness.  Mid sternal pain, radiates to back of both arms, radiates up into her neck and then to upper chest   Has to stop and rest for the pain to go away.   Walking uphill is worse.   Occurs more often if she has eaten and then exercises.  Has been active and healthy,  Walks regularly . Walked 2 miles a day and has had to slow down. Has slowly gained weight but no sudden weight gain   Nov. 29, 2017:  [name] is doing ok Still having some CP especially if she eats after exercising .  Has CP if she exercises 30 minutes - 1 hour after eat The CP is a tightness in her throat, radiates to her neck, out  through to her shoulders and down both arms  The symptoms are better after starting carvedilol.   The symptoms do still occur though.  October 26, 2016:  [name] presents as a work in visit . Has been having some chest pressure ,  Had just awakened. Turned over in bed and the pain resolved.  Did not have to take a SL NTG. Had some dyspnea.   The following day , she and her husband were walking,  She develeoped CP , asssociated with increased dyspnea.  Had to stop and rest .  Walked slower and made it home ok Last several minutes.   Yesterday she had recurrent CP .  Took a SL NTG prior to walking ( had just eaten and she frequently has CP if she walks after eating . )  Took 2 SL NTG yesterday .  No CP this am .   Her last last cath  was in Feb. 2016.  Had mild - moderate CAD by cath in Feb. 2016.         Past Medical History:  Diagnosis Date  . Anxiety   . Asthma   . CAD (coronary artery disease), native coronary artery   . Depression   . History of migraines   . Hyperlipidemia   . Irritable bowel syndrome     Past Surgical History:  Procedure Laterality Date  . Arm Lift  09-23-12  . BREAST ENHANCEMENT SURGERY    . CHOLECYSTECTOMY    . Correction of Esotropic  June 06', May '10  . LEFT HEART CATHETERIZATION WITH CORONARY ANGIOGRAM N/A 08/16/2014   Procedure: LEFT HEART CATHETERIZATION WITH CORONARY ANGIOGRAM;  Surgeon: Sinclair Grooms, MD;  Location: [facility] CATH LAB;  Service: Cardiovascular;  Laterality: N/A;  . PARTIAL COLECTOMY     in her 20's     Current Outpatient Prescriptions  Medication Sig Dispense Refill  . ALPRAZolam (XANAX) 0.5 MG tablet TAKE ONE TABLET BY MOUTH TWICE DAILY 60 tablet 5  . atorvastatin (LIPITOR) 20 MG tablet Take 20 mg by mouth daily.    . carvedilol (  COREG) 3.125 MG tablet Take 1 tablet (3.125 mg total) by mouth 2 (two) times daily. 60 tablet 11  . FLUoxetine (PROZAC) 40 MG capsule TAKE ONE CAPSULE BY MOUTH ONCE DAILY 30 capsule 0  . lactobacillus acidophilus (BACID) TABS tablet Take 2 tablets by mouth 3 (three) times daily.    . Multiple Vitamin (MULTI-VITAMINS) TABS Take 1 tablet by mouth daily.     . SUMAtriptan (IMITREX) 6 MG/0.5ML SOLN injection Inject 0.5 mLs (6 mg total) into the skin once. Subcutaneously injection as needed migraine 1 vial 3  . traZODone (DESYREL) 50 MG tablet TAKE 1 TABLET BY MOUTH AT BEDTIME (QUALITEST BRAND ONLY [phone]) 30 tablet 5   Current Facility-Administered Medications  Medication Dose Route Frequency Provider Last Rate Last Dose  . botulinum toxin Type A (BOTOX) injection 25 Units  25 Units Intramuscular Once Rebecca S Tat, DO        Allergies:   Codeine; Shellfish allergy; Amoxicillin-pot clavulanate; Other; and Rosuvastatin calcium     Social History:  The patient  reports that she has quit smoking. She started smoking about 6 years ago. She has never used smokeless tobacco. She reports that she does not drink alcohol or use drugs.   Family History:  The patient's family history includes Colon cancer in her maternal grandfather; Coronary artery disease in her father, mother, and sister; Diabetes in her sister; Heart disease in her father and mother; Hyperlipidemia in her mother; Hypertension in her mother; Peripheral vascular disease in her sister; Stroke (age of onset: 67) in her sister.    ROS:  Please see the history of present illness.    Review of Systems: Constitutional:  denies fever, chills, diaphoresis, appetite change and fatigue.  HEENT: denies photophobia, eye pain, redness, hearing loss, ear pain, congestion, sore throat, rhinorrhea, sneezing, neck pain, neck stiffness and tinnitus.  Respiratory: denies SOB, DOE, cough, chest tightness, and wheezing.  Cardiovascular: admits to chest pain, palpitations and leg swelling.  Gastrointestinal: denies nausea, vomiting, abdominal pain, diarrhea, constipation, blood in stool.  Genitourinary: denies dysuria, urgency, frequency, hematuria, flank pain and difficulty urinating.  Musculoskeletal: denies  myalgias, back pain, joint swelling, arthralgias and gait problem.   Skin: denies pallor, rash and wound.  Neurological: denies dizziness, seizures, syncope, weakness, light-headedness, numbness and headaches.   Hematological: denies adenopathy, easy bruising, personal or family bleeding history.  Psychiatric/ Behavioral: denies suicidal ideation, mood changes, confusion, nervousness, sleep disturbance and agitation.      All other systems are reviewed and negative.   PHYSICAL EXAM: VS:  BP 110/88 (BP Location: Right Arm, Patient Position: Sitting, Cuff Size: Normal)   Pulse 76   Ht 5' 1.5" (1.562 m)   Wt 141 lb 1.9 oz (64 kg)   SpO2 94%   BMI 26.23 kg/m  , BMI  Body mass index is 26.23 kg/m. GEN: Well nourished, well developed, in no acute distress  HEENT: normal  Neck: no JVD, carotid bruits, or masses Cardiac: RRR; no murmurs, rubs, or gallops,no edema  Respiratory:  clear to auscultation bilaterally, normal work of breathing GI: soft, nontender, nondistended, + BS MS: no deformity or atrophy  Skin: warm and dry, no rash Neuro:  Strength and sensation are intact Psych: normal  EKG:  EKG is ordered today. The ekg ordered today demonstrates  NSR at 73.   ST / T wave changes in inferior and lateral leads   Recent Labs: No results found for requested labs within last 8760 hours.    Lipid  Panel    Component Value Date/Time   CHOL 189 04/24/2010 1413   TRIG 66.0 04/24/2010 1413   HDL 61.30 04/24/2010 1413   CHOLHDL 3 04/24/2010 1413   VLDL 13.2 04/24/2010 1413   LDLCALC 115 (H) 04/24/2010 1413   LDLDIRECT 125.2 08/08/2009 1600      Wt Readings from Last 3 Encounters:  10/26/16 141 lb 1.9 oz (64 kg)  06/10/16 143 lb 12.8 oz (65.2 kg)  05/05/16 142 lb 3.2 oz (64.5 kg)      Other studies Reviewed: Additional studies/ records that were reviewed today include: . Review of the above records demonstrates:    ASSESSMENT AND PLAN:  1.  Chest pain /  Unstable angina :   [name]  presents for further evaluation of her chest discomfort. She's had a stress test in the past which was abnormal. She had a CT and gram of her coronary arteries which revealed Mixed disease in the proximal LAD.   Her echocardiogram shows normal left ventricular systolic function. She does have grade 2 diastolic dysfunction. The pulmonary pressures are normal.  She now presents with symptoms that are consistent with unstable angina. For the past 3 days she's had increasing episodes of chest pain and pressure. These episodes last for several minutes. She's had them with exertion and notes also had him with rest. The episode yesterday required 2 sublingual  nitroglycerin. She has known mild to moderate coronary artery disease.  At this point I think we should proceed with left heart catheterization. We discussed the risks, benefits, and options concerning heart catheterization. She understands and agrees to proceed.  precath labs today  Add ASA 81 mg a day   2. Hyperlipidemia: Her last LDL is 115. She does have moderate coronary artery disease. Our goal LDL is 70. We will increase the atorvastatin to 80 mg a day. We'll check fasting labs again in 3 months.    Current medicines are reviewed at length with the patient today.  The patient does not have concerns regarding medicines.  Labs/ tests ordered today include:  No orders of the defined types were placed in this encounter.    Mertie Moores, MD  10/26/2016 10:42 AM    [facility] [place], [place], [place]  [address] Phone: [phone]; Fax: [phone]

## 2016-10-26 NOTE — Patient Instructions (Addendum)
Medication Instructions:  INCREASE Atorvastatin to 80 mg once daily   Labwork: TODAY - PT/INR, BMET, CBC   Follow-Up: Your physician recommends that you schedule a follow-up appointment in: 4-6 weeks with Dr. Acie Fredrickson    Testing/Procedures:   [facility] CARDIOVASCULAR DIVISION [place] OFFICE [address] [place] [address] Dept: [phone] Loc: [place]  10/26/2016  You are scheduled for a Cardiac Catheterization on Tuesday, April 17 with Dr. Peter Martinique.  1. Please arrive at the [facility] (Main Entrance A) at [facility]: [address] [place], [place] [address] at 11:30 AM (two hours before your procedure to ensure your preparation). Free valet parking service is available.   Special note: Every effort is made to have your procedure done on time. Please understand that emergencies sometimes delay scheduled procedures.  2. Diet: Do not eat or drink anything after midnight prior to your procedure except sips of water to take medications.  3. Labs: You will need to have blood drawn on Monday, April 16 at [facility] at [facility]. [address]  Open: 7:30am - 5pm    Phone: [phone]. You do not need to be fasting.  4. Medication instructions in preparation for your procedure:  On the morning of your procedure, take your Aspirin and any morning medicines NOT listed above.  You may use sips of water.  5. Plan for one night stay--bring personal belongings. 6. Bring a current list of your medications and current insurance cards. 7. You MUST have a responsible person to drive you home. 8. Someone MUST be with you the first 24 hours after you arrive home or your discharge will be delayed. 9. Please wear clothes that are easy to get on and off and wear slip-on shoes.  Thank you for allowing [place] to care for you!   -- [facility]  [facility]   If you need a refill on your cardiac medications before your next appointment, please call your pharmacy.   Thank you for choosing [facility]! Christen Bame, RN [phone]

## 2016-10-26 NOTE — Telephone Encounter (Signed)
Spoke with patient who states she has had several episodes of chest pain through the weekend. She states pain starts at her throat and radiates down her arms. She states she woke up with pain Friday and after changing positions it got a little better. She states she took a NTG and the pain went away. On Saturday, she was going to go for a walk so she took a NTG prior to walking. She states she had chest pain and SOB with exertion despite having already taken the NTG. She states the pain eventually eased off but it took a while. She states she had another episode of chest pain and SOB on Sunday. She states she gets an overheated feeling but does not break out into a sweat. I reviewed her symptoms with Dr. Acie Fredrickson, who is in the office and he advised that patient come in for an appointment today. I changed patient's previous appointment for 4/26 to today with Dr. Acie Fredrickson. The patient thanked me for the call.

## 2016-10-27 ENCOUNTER — Ambulatory Visit (HOSPITAL_COMMUNITY)
Admission: RE | Admit: 2016-10-27 | Discharge: 2016-10-27 | Disposition: A | Discharge status: Home / Self Care | Payer: Medicare Other | Source: Ambulatory Visit | Attending: Cardiology | Admitting: Cardiology

## 2016-10-27 ENCOUNTER — Encounter (HOSPITAL_COMMUNITY)
Admission: RE | Disposition: A | Discharge status: Home / Self Care | Payer: Self-pay | Source: Ambulatory Visit | Attending: Cardiology

## 2016-10-27 DIAGNOSIS — Z87891 Personal history of nicotine dependence: Secondary | ICD-10-CM | POA: Diagnosis not present

## 2016-10-27 DIAGNOSIS — E785 Hyperlipidemia, unspecified: Secondary | ICD-10-CM | POA: Diagnosis not present

## 2016-10-27 DIAGNOSIS — Z91013 Allergy to seafood: Secondary | ICD-10-CM | POA: Insufficient documentation

## 2016-10-27 DIAGNOSIS — Z880 Allergy status to penicillin: Secondary | ICD-10-CM | POA: Diagnosis not present

## 2016-10-27 DIAGNOSIS — F419 Anxiety disorder, unspecified: Secondary | ICD-10-CM | POA: Diagnosis not present

## 2016-10-27 DIAGNOSIS — F329 Major depressive disorder, single episode, unspecified: Secondary | ICD-10-CM | POA: Diagnosis not present

## 2016-10-27 DIAGNOSIS — I2584 Coronary atherosclerosis due to calcified coronary lesion: Secondary | ICD-10-CM | POA: Insufficient documentation

## 2016-10-27 DIAGNOSIS — J45909 Unspecified asthma, uncomplicated: Secondary | ICD-10-CM | POA: Diagnosis not present

## 2016-10-27 DIAGNOSIS — I200 Unstable angina: Secondary | ICD-10-CM

## 2016-10-27 DIAGNOSIS — R079 Chest pain, unspecified: Secondary | ICD-10-CM | POA: Diagnosis present

## 2016-10-27 DIAGNOSIS — Z885 Allergy status to narcotic agent status: Secondary | ICD-10-CM | POA: Diagnosis not present

## 2016-10-27 DIAGNOSIS — Z8249 Family history of ischemic heart disease and other diseases of the circulatory system: Secondary | ICD-10-CM | POA: Diagnosis not present

## 2016-10-27 DIAGNOSIS — K589 Irritable bowel syndrome without diarrhea: Secondary | ICD-10-CM | POA: Insufficient documentation

## 2016-10-27 DIAGNOSIS — E7800 Pure hypercholesterolemia, unspecified: Secondary | ICD-10-CM | POA: Diagnosis present

## 2016-10-27 DIAGNOSIS — I2510 Atherosclerotic heart disease of native coronary artery without angina pectoris: Secondary | ICD-10-CM | POA: Diagnosis not present

## 2016-10-27 HISTORY — PX: LEFT HEART CATH AND CORONARY ANGIOGRAPHY: CATH118249

## 2016-10-27 SURGERY — LEFT HEART CATH AND CORONARY ANGIOGRAPHY
Anesthesia: LOCAL

## 2016-10-27 MED ORDER — MIDAZOLAM HCL 2 MG/2ML IJ SOLN
INTRAMUSCULAR | Status: DC | PRN
  Administered 2016-10-27: 2 mg via INTRAVENOUS

## 2016-10-27 MED ORDER — SODIUM CHLORIDE 0.9 % WEIGHT BASED INFUSION
3.0000 mL/kg/h | INTRAVENOUS | Status: DC
  Administered 2016-10-27: 3 mL/kg/h via INTRAVENOUS

## 2016-10-27 MED ORDER — IOPAMIDOL (ISOVUE-370) INJECTION 76%
INTRAVENOUS | Status: DC | PRN
  Administered 2016-10-27: 60 mL via INTRAVENOUS

## 2016-10-27 MED ORDER — LIDOCAINE HCL (PF) 1 % IJ SOLN
INTRAMUSCULAR | Status: DC | PRN
  Administered 2016-10-27: 2 mL

## 2016-10-27 MED ORDER — SODIUM CHLORIDE 0.9 % IV SOLN
250.0000 mL | INTRAVENOUS | Status: DC | PRN
Start: 2016-10-27 — End: 2016-10-27

## 2016-10-27 MED ORDER — VERAPAMIL HCL 2.5 MG/ML IV SOLN
INTRAVENOUS | Status: AC
  Filled 2016-10-27: qty 2

## 2016-10-27 MED ORDER — HEPARIN (PORCINE) IN NACL 2-0.9 UNIT/ML-% IJ SOLN
INTRAMUSCULAR | Status: DC | PRN
  Administered 2016-10-27: 1000 mL

## 2016-10-27 MED ORDER — SODIUM CHLORIDE 0.9 % WEIGHT BASED INFUSION: 1.0000 mL/kg/h | INTRAVENOUS | Status: DC

## 2016-10-27 MED ORDER — FENTANYL CITRATE (PF) 100 MCG/2ML IJ SOLN
INTRAMUSCULAR | Status: AC
  Filled 2016-10-27: qty 2

## 2016-10-27 MED ORDER — SODIUM CHLORIDE 0.9% FLUSH: 3.0000 mL | Freq: Two times a day (BID) | INTRAVENOUS | Status: DC

## 2016-10-27 MED ORDER — LIDOCAINE HCL (PF) 1 % IJ SOLN
INTRAMUSCULAR | Status: AC
  Filled 2016-10-27: qty 30

## 2016-10-27 MED ORDER — FENTANYL CITRATE (PF) 100 MCG/2ML IJ SOLN
INTRAMUSCULAR | Status: DC | PRN
  Administered 2016-10-27: 25 ug via INTRAVENOUS

## 2016-10-27 MED ORDER — HEPARIN (PORCINE) IN NACL 2-0.9 UNIT/ML-% IJ SOLN
INTRAMUSCULAR | Status: AC
  Filled 2016-10-27: qty 1000

## 2016-10-27 MED ORDER — SODIUM CHLORIDE 0.9 % IV SOLN: 250.0000 mL | INTRAVENOUS | Status: DC | PRN

## 2016-10-27 MED ORDER — SODIUM CHLORIDE 0.9% FLUSH: 3.0000 mL | INTRAVENOUS | Status: DC | PRN

## 2016-10-27 MED ORDER — HEPARIN SODIUM (PORCINE) 1000 UNIT/ML IJ SOLN
INTRAMUSCULAR | Status: DC | PRN
  Administered 2016-10-27: 3500 [IU] via INTRAVENOUS

## 2016-10-27 MED ORDER — VERAPAMIL HCL 2.5 MG/ML IV SOLN
INTRAVENOUS | Status: DC | PRN
  Administered 2016-10-27: 10 mL via INTRA_ARTERIAL

## 2016-10-27 MED ORDER — HEPARIN SODIUM (PORCINE) 1000 UNIT/ML IJ SOLN
INTRAMUSCULAR | Status: AC
  Filled 2016-10-27: qty 1

## 2016-10-27 MED ORDER — IOPAMIDOL (ISOVUE-370) INJECTION 76%
INTRAVENOUS | Status: AC
  Filled 2016-10-27: qty 100

## 2016-10-27 MED ORDER — MIDAZOLAM HCL 2 MG/2ML IJ SOLN
INTRAMUSCULAR | Status: AC
  Filled 2016-10-27: qty 2

## 2016-10-27 MED ORDER — ASPIRIN 81 MG PO CHEW: 81.0000 mg | CHEWABLE_TABLET | ORAL | Status: DC

## 2016-10-27 SURGICAL SUPPLY — 10 items
CATH 5FR JL3.5 JR4 ANG PIG MP (CATHETERS) ×1 IMPLANT
DEVICE RAD COMP TR BAND LRG (VASCULAR PRODUCTS) ×1 IMPLANT
GLIDESHEATH SLEND SS 6F .021 (SHEATH) ×1 IMPLANT
GUIDEWIRE INQWIRE 1.5J.035X260 (WIRE) IMPLANT
INQWIRE 1.5J .035X260CM (WIRE) ×2
KIT HEART LEFT (KITS) ×2 IMPLANT
PACK CARDIAC CATHETERIZATION (CUSTOM PROCEDURE TRAY) ×2 IMPLANT
SYR MEDRAD MARK V 150ML (SYRINGE) ×2 IMPLANT
TRANSDUCER W/STOPCOCK (MISCELLANEOUS) ×2 IMPLANT
TUBING CIL FLEX 10 FLL-RA (TUBING) ×2 IMPLANT

## 2016-10-27 NOTE — H&P (View-Only) (Signed)
Cardiology Office Note   Date:  10/26/2016   ID:  [name], DOB 12-30-1945, MRN [id number]  PCP:  Gerrit Heck, MD  Cardiologist:   Mertie Moores, MD   Chief Complaint  Patient presents with  . Coronary Artery Disease   Problem List 1. Chest pain     Oct. 24, 2017:   [name] is a 71 y.o. female who presents for exertional dyspnea / heaviness  started several years ago. Has seen Dr. Wynonia Lawman in the past.  Abnormal stress test  Cath Feb. 4, 2016 showed mid LAD stenosis of 40-50%.    she has exertional chest tightness.  Mid sternal pain, radiates to back of both arms, radiates up into her neck and then to upper chest   Has to stop and rest for the pain to go away.   Walking uphill is worse.   Occurs more often if she has eaten and then exercises.  Has been active and healthy,  Walks regularly . Walked 2 miles a day and has had to slow down. Has slowly gained weight but no sudden weight gain   Nov. 29, 2017:  [name] is doing ok Still having some CP especially if she eats after exercising .  Has CP if she exercises 30 minutes - 1 hour after eat The CP is a tightness in her throat, radiates to her neck, out  through to her shoulders and down both arms  The symptoms are better after starting carvedilol.   The symptoms do still occur though.  October 26, 2016:  [name] presents as a work in visit . Has been having some chest pressure ,  Had just awakened. Turned over in bed and the pain resolved.  Did not have to take a SL NTG. Had some dyspnea.   The following day , she and her husband were walking,  She develeoped CP , asssociated with increased dyspnea.  Had to stop and rest .  Walked slower and made it home ok Last several minutes.   Yesterday she had recurrent CP .  Took a SL NTG prior to walking ( had just eaten and she frequently has CP if she walks after eating . )  Took 2 SL NTG yesterday .  No CP this am .   Her last last cath  was in Feb. 2016.  Had mild - moderate CAD by cath in Feb. 2016.         Past Medical History:  Diagnosis Date  . Anxiety   . Asthma   . CAD (coronary artery disease), native coronary artery   . Depression   . History of migraines   . Hyperlipidemia   . Irritable bowel syndrome     Past Surgical History:  Procedure Laterality Date  . Arm Lift  09-23-12  . BREAST ENHANCEMENT SURGERY    . CHOLECYSTECTOMY    . Correction of Esotropic  June 06', May '10  . LEFT HEART CATHETERIZATION WITH CORONARY ANGIOGRAM N/A 08/16/2014   Procedure: LEFT HEART CATHETERIZATION WITH CORONARY ANGIOGRAM;  Surgeon: Sinclair Grooms, MD;  Location: [facility] CATH LAB;  Service: Cardiovascular;  Laterality: N/A;  . PARTIAL COLECTOMY     in her 20's     Current Outpatient Prescriptions  Medication Sig Dispense Refill  . ALPRAZolam (XANAX) 0.5 MG tablet TAKE ONE TABLET BY MOUTH TWICE DAILY 60 tablet 5  . atorvastatin (LIPITOR) 20 MG tablet Take 20 mg by mouth daily.    . carvedilol (  COREG) 3.125 MG tablet Take 1 tablet (3.125 mg total) by mouth 2 (two) times daily. 60 tablet 11  . FLUoxetine (PROZAC) 40 MG capsule TAKE ONE CAPSULE BY MOUTH ONCE DAILY 30 capsule 0  . lactobacillus acidophilus (BACID) TABS tablet Take 2 tablets by mouth 3 (three) times daily.    . Multiple Vitamin (MULTI-VITAMINS) TABS Take 1 tablet by mouth daily.     . SUMAtriptan (IMITREX) 6 MG/0.5ML SOLN injection Inject 0.5 mLs (6 mg total) into the skin once. Subcutaneously injection as needed migraine 1 vial 3  . traZODone (DESYREL) 50 MG tablet TAKE 1 TABLET BY MOUTH AT BEDTIME (QUALITEST BRAND ONLY [phone]) 30 tablet 5   Current Facility-Administered Medications  Medication Dose Route Frequency Provider Last Rate Last Dose  . botulinum toxin Type A (BOTOX) injection 25 Units  25 Units Intramuscular Once Rebecca S Tat, DO        Allergies:   Codeine; Shellfish allergy; Amoxicillin-pot clavulanate; Other; and Rosuvastatin calcium     Social History:  The patient  reports that she has quit smoking. She started smoking about 6 years ago. She has never used smokeless tobacco. She reports that she does not drink alcohol or use drugs.   Family History:  The patient's family history includes Colon cancer in her maternal grandfather; Coronary artery disease in her father, mother, and sister; Diabetes in her sister; Heart disease in her father and mother; Hyperlipidemia in her mother; Hypertension in her mother; Peripheral vascular disease in her sister; Stroke (age of onset: 66) in her sister.    ROS:  Please see the history of present illness.    Review of Systems: Constitutional:  denies fever, chills, diaphoresis, appetite change and fatigue.  HEENT: denies photophobia, eye pain, redness, hearing loss, ear pain, congestion, sore throat, rhinorrhea, sneezing, neck pain, neck stiffness and tinnitus.  Respiratory: denies SOB, DOE, cough, chest tightness, and wheezing.  Cardiovascular: admits to chest pain, palpitations and leg swelling.  Gastrointestinal: denies nausea, vomiting, abdominal pain, diarrhea, constipation, blood in stool.  Genitourinary: denies dysuria, urgency, frequency, hematuria, flank pain and difficulty urinating.  Musculoskeletal: denies  myalgias, back pain, joint swelling, arthralgias and gait problem.   Skin: denies pallor, rash and wound.  Neurological: denies dizziness, seizures, syncope, weakness, light-headedness, numbness and headaches.   Hematological: denies adenopathy, easy bruising, personal or family bleeding history.  Psychiatric/ Behavioral: denies suicidal ideation, mood changes, confusion, nervousness, sleep disturbance and agitation.      All other systems are reviewed and negative.   PHYSICAL EXAM: VS:  BP 110/88 (BP Location: Right Arm, Patient Position: Sitting, Cuff Size: Normal)   Pulse 76   Ht 5' 1.5" (1.562 m)   Wt 141 lb 1.9 oz (64 kg)   SpO2 94%   BMI 26.23 kg/m  , BMI  Body mass index is 26.23 kg/m. GEN: Well nourished, well developed, in no acute distress  HEENT: normal  Neck: no JVD, carotid bruits, or masses Cardiac: RRR; no murmurs, rubs, or gallops,no edema  Respiratory:  clear to auscultation bilaterally, normal work of breathing GI: soft, nontender, nondistended, + BS MS: no deformity or atrophy  Skin: warm and dry, no rash Neuro:  Strength and sensation are intact Psych: normal  EKG:  EKG is ordered today. The ekg ordered today demonstrates  NSR at 73.   ST / T wave changes in inferior and lateral leads   Recent Labs: No results found for requested labs within last 8760 hours.    Lipid  Panel    Component Value Date/Time   CHOL 189 04/24/2010 1413   TRIG 66.0 04/24/2010 1413   HDL 61.30 04/24/2010 1413   CHOLHDL 3 04/24/2010 1413   VLDL 13.2 04/24/2010 1413   LDLCALC 115 (H) 04/24/2010 1413   LDLDIRECT 125.2 08/08/2009 1600      Wt Readings from Last 3 Encounters:  10/26/16 141 lb 1.9 oz (64 kg)  06/10/16 143 lb 12.8 oz (65.2 kg)  05/05/16 142 lb 3.2 oz (64.5 kg)      Other studies Reviewed: Additional studies/ records that were reviewed today include: . Review of the above records demonstrates:    ASSESSMENT AND PLAN:  1.  Chest pain /  Unstable angina :   [name]  presents for further evaluation of her chest discomfort. She's had a stress test in the past which was abnormal. She had a CT and gram of her coronary arteries which revealed Mixed disease in the proximal LAD.   Her echocardiogram shows normal left ventricular systolic function. She does have grade 2 diastolic dysfunction. The pulmonary pressures are normal.  She now presents with symptoms that are consistent with unstable angina. For the past 3 days she's had increasing episodes of chest pain and pressure. These episodes last for several minutes. She's had them with exertion and notes also had him with rest. The episode yesterday required 2 sublingual  nitroglycerin. She has known mild to moderate coronary artery disease.  At this point I think we should proceed with left heart catheterization. We discussed the risks, benefits, and options concerning heart catheterization. She understands and agrees to proceed.  precath labs today  Add ASA 81 mg a day   2. Hyperlipidemia: Her last LDL is 115. She does have moderate coronary artery disease. Our goal LDL is 70. We will increase the atorvastatin to 80 mg a day. We'll check fasting labs again in 3 months.    Current medicines are reviewed at length with the patient today.  The patient does not have concerns regarding medicines.  Labs/ tests ordered today include:  No orders of the defined types were placed in this encounter.    Mertie Moores, MD  10/26/2016 10:42 AM    [facility] [place], [place], [place]  [address] Phone: [phone]; Fax: [phone]

## 2016-10-27 NOTE — Discharge Instructions (Signed)
Radial Site Care °Refer to this sheet in the next few weeks. These instructions provide you with information about caring for yourself after your procedure. Your health care provider may also give you more specific instructions. Your treatment has been planned according to current medical practices, but problems sometimes occur. Call your health care provider if you have any problems or questions after your procedure. °What can I expect after the procedure? °After your procedure, it is typical to have the following: °· Bruising at the radial site that usually fades within 1-2 weeks. °· Blood collecting in the tissue (hematoma) that may be painful to the touch. It should usually decrease in size and tenderness within 1-2 weeks. °Follow these instructions at home: °· Take medicines only as directed by your health care provider. °· You may shower 24-48 hours after the procedure or as directed by your health care provider. Remove the bandage (dressing) and gently wash the site with plain soap and water. Pat the area dry with a clean towel. Do not rub the site, because this may cause bleeding. °· Do not take baths, swim, or use a hot tub until your health care provider approves. °· Check your insertion site every day for redness, swelling, or drainage. °· Do not apply powder or lotion to the site. °· Do not flex or bend the affected arm for 24 hours or as directed by your health care provider. °· Do not push or pull heavy objects with the affected arm for 24 hours or as directed by your health care provider. °· Do not lift over 10 lb (4.5 kg) for 5 days after your procedure or as directed by your health care provider. °· Ask your health care provider when it is okay to: °¨ Return to work or school. °¨ Resume usual physical activities or sports. °¨ Resume sexual activity. °· Do not drive home if you are discharged the same day as the procedure. Have someone else drive you. °· You may drive 24 hours after the procedure  unless otherwise instructed by your health care provider. °· Do not operate machinery or power tools for 24 hours after the procedure. °· If your procedure was done as an outpatient procedure, which means that you went home the same day as your procedure, a responsible adult should be with you for the first 24 hours after you arrive home. °· Keep all follow-up visits as directed by your health care provider. This is important. °Contact a health care provider if: °· You have a fever. °· You have chills. °· You have increased bleeding from the radial site. Hold pressure on the site. °Get help right away if: °· You have unusual pain at the radial site. °· You have redness, warmth, or swelling at the radial site. °· You have drainage (other than a small amount of blood on the dressing) from the radial site. °· The radial site is bleeding, and the bleeding does not stop after 30 minutes of holding steady pressure on the site. °· Your arm or hand becomes pale, cool, tingly, or numb. °This information is not intended to replace advice given to you by your health care provider. Make sure you discuss any questions you have with your health care provider. °Document Released: 08/01/2010 Document Revised: 12/05/2015 Document Reviewed: 01/15/2014 °Elsevier Interactive Patient Education © 2017 [employer] ° °

## 2016-10-27 NOTE — Interval H&P Note (Signed)
History and Physical Interval Note:  10/27/2016 1:31 PM  [name]  has presented today for surgery, with the diagnosis of chest pain  The various methods of treatment have been discussed with the patient and family. After consideration of risks, benefits and other options for treatment, the patient has consented to  Procedure(s): Left Heart Cath and Coronary Angiography (N/A) as a surgical intervention .  The patient's history has been reviewed, patient examined, no change in status, stable for surgery.  I have reviewed the patient's chart and labs.  Questions were answered to the patient's satisfaction.   Cath Lab Visit (complete for each Cath Lab visit)  Clinical Evaluation Leading to the Procedure:   ACS: No.  Non-ACS:    Anginal Classification: CCS III  Anti-ischemic medical therapy: Minimal Therapy (1 class of medications)  Non-Invasive Test Results: No non-invasive testing performed  Prior CABG: No previous CABG        [facility] 10/27/2016 1:31 PM

## 2016-10-28 ENCOUNTER — Encounter (HOSPITAL_COMMUNITY): Payer: Self-pay | Admitting: Cardiology

## 2016-11-02 ENCOUNTER — Encounter: Payer: Self-pay | Admitting: Cardiovascular Disease

## 2016-11-03 ENCOUNTER — Telehealth: Payer: Self-pay | Admitting: Cardiovascular Disease

## 2016-11-03 DIAGNOSIS — R0789 Other chest pain: Secondary | ICD-10-CM

## 2016-11-03 NOTE — Telephone Encounter (Signed)
Will route to Dr. Acie Fredrickson for review and advisement.

## 2016-11-03 NOTE — Telephone Encounter (Signed)
New message      Pt states that her cath was neg and Dr Acie Fredrickson want her to see a GI.  GI doctor need a referral.  Please schedule pt an appt with GI, send ov notes---any time/any day is fine.  Also, prior to cath, Dr Acie Fredrickson increased her lipitor.  Since cath was neg, will she still need to continue increased dosage of lipitor?  Please call

## 2016-11-03 NOTE — Telephone Encounter (Signed)
I have reviewed the cath films She has a hyperdynamic LV contractility pattern. She would benefit from more beta blockade. Please increase the coreg to 6.25 BID Will see her back in 3-4 weeks to see if this helps her angina. ( we may need to consider changing to metoprolol so that we will not be limited by BP)  She should as her primary  ( Dr. Drema Dallas) for the referral to GI. That is the most appropriate pathway

## 2016-11-04 MED ORDER — METOPROLOL SUCCINATE ER 25 MG PO TB24: 25.0000 mg | ORAL_TABLET | Freq: Every day | ORAL | 11 refills | Status: DC

## 2016-11-04 NOTE — Telephone Encounter (Signed)
Spoke with patient about previous message from yesterday. I reviewed Dr. Elmarie Shiley advice with her and she states her BP is typically low 557'D mmHg systolic and 22'G mmHg diastolic.  She states she has had problems taking anti-hypertensive agents in the past. I spoke with Dr. Acie Fredrickson, who is in the office, and he would like to start Toprol 25 mg once daily.  I advised her to monitor BP and call back if she has symptoms of low BP or other concerns prior to follow-up appointment scheduled for 5/22. She states she is thinking of changing PCP. I advised that we will go ahead and refer her to GI and referral to [facility] placed per her request. I advised that someone from their office will call her to schedule appointment. I advised her to continue current dose of Lipitor at this time and that Dr. Acie Fredrickson can address that at next office visit. I answered her questions to her satisfaction and she thanked me for the call.

## 2016-11-05 ENCOUNTER — Ambulatory Visit (INDEPENDENT_AMBULATORY_CARE_PROVIDER_SITE_OTHER): Payer: Medicare Other | Admitting: Gastroenterology

## 2016-11-05 ENCOUNTER — Ambulatory Visit: Payer: Medicare Other | Admitting: Cardiovascular Disease

## 2016-11-05 ENCOUNTER — Encounter: Payer: Self-pay | Admitting: Gastroenterology

## 2016-11-05 VITALS — BP 104/76 | HR 72 | Ht 61.5 in | Wt 143.0 lb

## 2016-11-05 DIAGNOSIS — R197 Diarrhea, unspecified: Secondary | ICD-10-CM | POA: Diagnosis not present

## 2016-11-05 DIAGNOSIS — K219 Gastro-esophageal reflux disease without esophagitis: Secondary | ICD-10-CM | POA: Diagnosis not present

## 2016-11-05 DIAGNOSIS — R079 Chest pain, unspecified: Secondary | ICD-10-CM

## 2016-11-05 DIAGNOSIS — R12 Heartburn: Secondary | ICD-10-CM

## 2016-11-05 MED ORDER — RANITIDINE HCL 150 MG PO TABS: 150.0000 mg | ORAL_TABLET | Freq: Every day | ORAL | 3 refills | Status: DC

## 2016-11-05 MED ORDER — NA SULFATE-K SULFATE-MG SULF 17.5-3.13-1.6 GM/177ML PO SOLN: 1.0000 | Freq: Once | ORAL | 0 refills | Status: AC

## 2016-11-05 MED ORDER — OMEPRAZOLE 40 MG PO CPDR: 40.0000 mg | DELAYED_RELEASE_CAPSULE | Freq: Every day | ORAL | 3 refills | Status: DC

## 2016-11-05 NOTE — Progress Notes (Signed)
[name]    [id number]    September 02, 1945  Primary Care [address], MD  Referring Physician: Leighton Ruff, MD [address], [place] [address]  Chief complaint:  Atypical chest pain  HPI: 71 year old female here for evaluation of atypical chest pain, burning sensation in the back of her throat and chronic diarrhea. She was previously followed by Dr. Olevia Perches was last seen 10/26/2012. Patient complains of intermittent chest pain in the retrosternal area that radiates up to her throat associated with burning sensation in her throat. She underwent extensive cardiac evaluation including left heart catheterization which showed mild LAD stenosis otherwise unremarkable. Normal ejection fraction. Patient denies any dysphagia. No nausea, vomiting or abdominal pain. She takes NSAIDs regularly.  She has taken nitroglycerin with some improvement of the retrosternal chest pain and burning sensation. No relation to diet or activity. She has woken up from sleep with the chest pain on some nights Doesn't take PPI, H2 blocker or antacids. She is status post right hemicolectomy 30 years ago for perforated diverticulitis with peritonitis. She has history of chronic intermittent diarrhea. She has episodes of diarrhea 4-5 days a week with 3 or 4 loose bowel movements on these days.  Colonoscopy 10/26/2012: Severe left-sided diverticulosis. Remote 5 diminutive rectal polyps that were hyperplastic  Outpatient Encounter Prescriptions as of 11/05/2016  Medication Sig  . acetaminophen (TYLENOL) 500 MG tablet Take 1,000 mg by mouth as needed for moderate pain or headache.   . ALPRAZolam (XANAX) 0.5 MG tablet TAKE ONE TABLET BY MOUTH TWICE DAILY  . aspirin-acetaminophen-caffeine (EXCEDRIN MIGRAINE) 250-250-65 MG tablet Take 2 tablets by mouth every 6 (six) hours as needed for headache or migraine.  Marland Kitchen atorvastatin (LIPITOR) 80 MG tablet Take 1 tablet (80 mg total) by  mouth daily.  . diclofenac (VOLTAREN) 75 MG EC tablet Take 75 mg by mouth 2 (two) times daily as needed for mild pain.  Marland Kitchen FLUoxetine (PROZAC) 40 MG capsule TAKE ONE CAPSULE BY MOUTH ONCE DAILY  . metoprolol succinate (TOPROL XL) 25 MG 24 hr tablet Take 1 tablet (25 mg total) by mouth daily.  Marland Kitchen oxymetazoline (AFRIN) 0.05 % nasal spray Place 1-3 sprays into both nostrils at bedtime as needed for congestion.  . Tetrahydrozoline HCl (REDNESS RELIEVER EYE DROPS OP) Apply 1 drop to eye daily as needed (redness).  . traZODone (DESYREL) 50 MG tablet TAKE 1 TABLET BY MOUTH AT BEDTIME (QUALITEST BRAND ONLY [phone])  . [DISCONTINUED] aspirin EC 81 MG tablet Take 1 tablet (81 mg total) by mouth daily.   No facility-administered encounter medications on file as of 11/05/2016.     Allergies as of 11/05/2016 - Review Complete 11/05/2016  Allergen Reaction Noted  . Codeine Nausea And Vomiting 08/22/2012  . Shellfish allergy Swelling 08/22/2012  . Amoxicillin-pot clavulanate Other (See Comments) 04/06/2016  . Crestor [rosuvastatin calcium] Other (See Comments) 10/11/2012    Past Medical History:  Diagnosis Date  . Anxiety   . Arthritis   . Asthma   . Bowel obstruction ([place])   . CAD (coronary artery disease), native coronary artery   . Colon polyps   . Depression   . Diverticulosis   . History of migraines   . Hyperlipidemia   . Irritable bowel syndrome   . Pneumonia     Past Surgical History:  Procedure Laterality Date  . Arm Lift Bilateral 09/23/2012  . BREAST ENHANCEMENT SURGERY    . CHOLECYSTECTOMY    .  Correction of Esotropic  June 06', May '10  . LEFT HEART CATH AND CORONARY ANGIOGRAPHY N/A 10/27/2016   Procedure: Left Heart Cath and Coronary Angiography;  Surgeon: Peter M Martinique, MD;  Location: [facility];  Service: Cardiovascular;  Laterality: N/A;  . LEFT HEART CATHETERIZATION WITH CORONARY ANGIOGRAM N/A 08/16/2014   Procedure: LEFT HEART CATHETERIZATION WITH CORONARY  ANGIOGRAM;  Surgeon: Sinclair Grooms, MD;  Location: [facility] CATH LAB;  Service: Cardiovascular;  Laterality: N/A;  . PARTIAL COLECTOMY     in her 20's  . TONSILLECTOMY      Family History  Problem Relation Age of Onset  . Heart disease Mother     CHF  . Coronary artery disease Mother   . Hypertension Mother   . Hyperlipidemia Mother   . Heart disease Father     CHF  . Coronary artery disease Father   . Diabetes Sister     with all the complications  . Coronary artery disease Sister   . Stroke Sister 50    CVA  . Peripheral vascular disease Sister   . Colon cancer Maternal Grandfather   . Cancer Neg Hx     breast or colon    Social History   Social History  . Marital status: Married    Spouse name: N/A  . Number of children: 2  . Years of education: N/A   Occupational History  . 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 Smoking status: Former Smoker    Quit date: 01/10/2010  . Smokeless tobacco: Never Used  . Alcohol use No  . Drug use: No  . Sexual activity: Yes    Partners: Female   Other Topics Concern  . 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   Marriage in good health.      Physician roster   Gyn- Helene Shoe, NP   Opthal- Dr Annamaria Boots (pediatric opthal)   GI- dr Adriana Simas- Dr Gladstone Lighter   ENT- Dr Ernesto Rutherford               Review of systems: Review of Systems  Constitutional: Negative for fever and chills.  HENT: Negative.   Eyes: Negative for blurred vision.  Respiratory: Negative for cough, shortness of breath and wheezing.   Cardiovascular: Negative for chest pain and palpitations.  Gastrointestinal: as per HPI Genitourinary: Negative for dysuria, urgency, frequency and hematuria.  Musculoskeletal: Positive for myalgias, back pain  and joint pain.  Skin: Negative for itching and rash.  Neurological: Negative for dizziness, tremors, focal weakness, seizures and loss of consciousness.  Endo/Heme/Allergies: Positive for seasonal allergies.  Psychiatric/Behavioral: Negative for depression, suicidal ideas and hallucinations.  positive for anxiety All other systems reviewed and are negative.   Physical Exam: Vitals:   11/05/16 0930  BP: 104/76  Pulse: 72   Body mass index is 26.58 kg/m. Gen:      No acute distress HEENT:  EOMI, sclera anicteric Neck:     No masses; no thyromegaly Lungs:    Clear to auscultation bilaterally; normal respiratory effort CV:         Regular rate and rhythm; no murmurs Abd:      + bowel sounds; soft, non-tender; no palpable masses, no distension  Ext:    No edema; adequate peripheral perfusion Skin:      Warm and dry; no rash Neuro: alert and oriented x 3 Psych: normal mood and affect  Data Reviewed:  Reviewed labs, radiology imaging, old records and pertinent past GI work up   Assessment and Plan/Recommendations:  71 year old female status post right hemicolectomy with severe left-sided diverticulosis here with complaints of atypical chest pain, heartburn and diarrhea  Atypical chest pain could be secondary to gastroesophageal reflux disease and esophageal spasm Start omeprazole 40 mg daily, 30 minutes before breakfast Ranitidine 150 mg at bedtime as needed Discussed antireflux diet and lifestyle modification in detail  We'll schedule EGD to evaluate atypical chest pain and exclude esophagitis, ulcer or stricture  Chronic diarrhea: Schedule colonoscopy along with EGD and obtain colon biopsies to exclude microscopic colitis Patient may have a component of bile salt diarrhea, she prefers to wait to start cholestyramine until after her chest pain is resolved and results of colonoscopy  The risks and benefits as well as alternatives of endoscopic procedure(s) have been discussed  and reviewed. All questions answered. The patient agrees to proceed.   Damaris Hippo , MD [phone] Mon-Fri 8a-5p [phone] after 5p, weekends, holidays  CC: Leighton Ruff, MD

## 2016-11-05 NOTE — Patient Instructions (Signed)
You have been scheduled for an endoscopy and colonoscopy. Please follow the written instructions given to you at your visit today. Please pick up your prep supplies at the pharmacy within the next 1-3 days. If you use inhalers (even only as needed), please bring them with you on the day of your procedure. Your physician has requested that you go to www.startemmi.com and enter the access code given to you at your visit today. This web site gives a general overview about your procedure. However, you should still follow specific instructions given to you by our office regarding your preparation for the procedure.  We have sent in Omeprazole and zantac to your pharmacy

## 2016-11-13 ENCOUNTER — Telehealth: Payer: Self-pay | Admitting: Gastroenterology

## 2016-11-13 ENCOUNTER — Ambulatory Visit (INDEPENDENT_AMBULATORY_CARE_PROVIDER_SITE_OTHER): Payer: Medicare Other | Admitting: Neurology

## 2016-11-13 DIAGNOSIS — G5139 Clonic hemifacial spasm, unspecified: Secondary | ICD-10-CM

## 2016-11-13 DIAGNOSIS — G513 Clonic hemifacial spasm: Secondary | ICD-10-CM | POA: Diagnosis not present

## 2016-11-13 MED ORDER — ONABOTULINUMTOXINA 100 UNITS IJ SOLR
25.0000 [IU] | Freq: Once | INTRAMUSCULAR | Status: AC
  Administered 2016-11-13: 25 [IU] via INTRAMUSCULAR

## 2016-11-13 NOTE — Procedures (Signed)
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  Been 8 months and sx's just returned a few weeks ago.  No asymmetry of face.  No spasm noted today on clinical examination but both sides done as she was previously experiencing some asymmetry due to use of botox  Consent obtained from: The patient Benefits discussed included, but were not limited to decreased muscle tightness, increased joint range of motion, and decreased pain.  Risk discussed included, but were not limited pain and discomfort, bleeding, bruising, excessive weakness, venous thrombosis, muscle atrophy and dysphagia.  A copy of the patient medication guide was given to the patient which explains the blackbox warning.  Patients identity and treatment sites confirmed yes.  Details of Procedure: Skin was cleaned with alcohol.   Prior to injection, the needle plunger was aspirated to make sure the needle was not within a blood vessel.  There was no blood retrieved on aspiration.    Following is a summary of the muscles injected  And the amount of Botulinum toxin used:  Injections  Location Left  Right Units Number of sites        Corrugator 2.5  2.5   Procerus 2.5 2.5 5.0   Lower Lid, Lateral       Lower Lid Medial  2.5    2.5    Upper Lid, Lateral 2.5   2.5   Upper Lid, Medial      Canthus 2.5 2.5 5.0   Nasalis      Masseter      Temporalis 2.5  2.5   Zygomaticus Major 2.5  2.5   TOTAL UNITS:   22.5    Agent: Botulinum Type A ( Onobotulinum Toxin type A ).  1 vials of Botox were used, each containing 50 units and freshly diluted with 2 mL of sterile, non-perserved saline   Total injected (Units): 22.5  Total wasted (Units): 2.5 Pt tolerated procedure well without complications.   Reinjection is anticipated in 3 months.

## 2016-11-13 NOTE — Telephone Encounter (Signed)
I spoke with Dr. Silverio Decamp, who states it is ok to do the EGD only if diarrhea has cleared.  Pt made aware and instructions for EGD given.  Understanding voiced

## 2016-11-13 NOTE — Telephone Encounter (Signed)
Spoke with pt who states she is no longer having any diarrhea at all; states medication she was given has taken care of this problem.  She doesn't feel like she needs the colonoscopy but does was the EGD.    Dr. Silverio Decamp, Please advise  Thanks, Cyril Mourning

## 2016-11-16 ENCOUNTER — Ambulatory Visit (AMBULATORY_SURGERY_CENTER): Payer: Medicare Other | Admitting: Gastroenterology

## 2016-11-16 VITALS — BP 112/54 | HR 60 | Temp 97.3°F | Resp 16 | Ht 61.0 in | Wt 143.0 lb

## 2016-11-16 DIAGNOSIS — R0789 Other chest pain: Secondary | ICD-10-CM

## 2016-11-16 MED ORDER — SODIUM CHLORIDE 0.9 % IV SOLN: 500.0000 mL | INTRAVENOUS | Status: DC

## 2016-11-16 NOTE — Progress Notes (Signed)
Pt's states no medical or surgical changes since previsit or office visit. 

## 2016-11-16 NOTE — Op Note (Signed)
[facility] Patient Name: [name] Procedure Date: 11/16/2016 9:40 AM MRN: [id number] Endoscopist: Mauri Pole , MD Age: 71 Referring MD:  Date of Birth: 1946-03-19 Gender: Female Account #: [id number] Procedure:                Upper GI endoscopy Indications:              New-onset upper abdominal symptoms in patient older                            than 50 years, Unexplained chest pain, Chest pain                            (non cardiac) Medicines:                Monitored Anesthesia Care Procedure:                Pre-Anesthesia Assessment:                           - Prior to the procedure, a History and Physical                            was performed, and patient medications and                            allergies were reviewed. The patient's tolerance of                            previous anesthesia was also reviewed. The risks                            and benefits of the procedure and the sedation                            options and risks were discussed with the patient.                            All questions were answered, and informed consent                            was obtained. Prior Anticoagulants: The patient has                            taken no previous anticoagulant or antiplatelet                            agents. ASA Grade Assessment: II - A patient with                            mild systemic disease. After reviewing the risks                            and benefits, the patient was deemed in  satisfactory condition to undergo the procedure.                           After obtaining informed consent, the endoscope was                            passed under direct vision. Throughout the                            procedure, the patient's blood pressure, pulse, and                            oxygen saturations were monitored continuously. The                            Endoscope was introduced through  the mouth, and                            advanced to the second part of duodenum. The upper                            GI endoscopy was accomplished without difficulty.                            The patient tolerated the procedure well. Scope In: Scope Out: Findings:                 LA Grade A (one or more mucosal breaks less than 5                            mm, not extending between tops of 2 mucosal folds)                            esophagitis with no bleeding was found 32 to 35 cm                            from the incisors.                           A small hiatal hernia was present.                           The cardia and gastric fundus were normal on                            retroflexion.                           The examined duodenum was normal. Complications:            No immediate complications. Estimated Blood Loss:     Estimated blood loss: none. Impression:               - LA Grade A reflux esophagitis.                           -  Small hiatal hernia.                           - Normal examined duodenum.                           - No specimens collected. Recommendation:           - Patient has a contact number available for                            emergencies. The signs and symptoms of potential                            delayed complications were discussed with the                            patient. Return to normal activities tomorrow.                            Written discharge instructions were provided to the                            patient.                           - Resume previous diet.                           - Continue present medications.                           - No ibuprofen, naproxen, or other non-steroidal                            anti-inflammatory drugs.                           - Follow an antireflux regimen. Mauri Pole, MD 11/16/2016 10:05:58 AM This report has been signed electronically.

## 2016-11-16 NOTE — Patient Instructions (Signed)
YOU HAD AN ENDOSCOPIC PROCEDURE TODAY AT [facility]:   Refer to the procedure report that was given to you for any specific questions about what was found during the examination.  If the procedure report does not answer your questions, please call your gastroenterologist to clarify.  If you requested that your care partner not be given the details of your procedure findings, then the procedure report has been included in a sealed envelope for you to review at your convenience later.  YOU SHOULD EXPECT: Some feelings of bloating in the abdomen. Passage of more gas than usual.  Walking can help get rid of the air that was put into your GI tract during the procedure and reduce the bloating. If you had a lower endoscopy (such as a colonoscopy or flexible sigmoidoscopy) you may notice spotting of blood in your stool or on the toilet paper. If you underwent a bowel prep for your procedure, you may not have a normal bowel movement for a few days.  Please Note:  You might notice some irritation and congestion in your nose or some drainage.  This is from the oxygen used during your procedure.  There is no need for concern and it should clear up in a day or so.  SYMPTOMS TO REPORT IMMEDIATELY:   FFollowing upper endoscopy (EGD)  Vomiting of blood or coffee ground material  New chest pain or pain under the shoulder blades  Painful or persistently difficult swallowing  New shortness of breath  Fever of 100F or higher  Black, tarry-looking stools  For urgent or emergent issues, a gastroenterologist can be reached at any hour by calling [phone].   DIET:  We do recommend a small meal at first, but then you may proceed to your regular diet.  Drink plenty of fluids but you should avoid alcoholic beverages for 24 hours.  MEDICATIONS:  Continue present medications. No Ibuprofen, Naproxen, or other non-steroidal anti-inflammatory drugs.  Follow an anti-reflux regimen.  Please see  handouts given to you by your recovery nurse.  ACTIVITY:  You should plan to take it easy for the rest of today and you should NOT DRIVE or use heavy machinery until tomorrow (because of the sedation medicines used during the test).    FOLLOW UP: Our staff will call the number listed on your records the next business day following your procedure to check on you and address any questions or concerns that you may have regarding the information given to you following your procedure. If we do not reach you, we will leave a message.  However, if you are feeling well and you are not experiencing any problems, there is no need to return our call.  We will assume that you have returned to your regular daily activities without incident.  If any biopsies were taken you will be contacted by phone or by letter within the next 1-3 weeks.  Please call us at [phone] if you have not heard about the biopsies in 3 weeks.    SIGNATURES/CONFIDENTIALITY: You and/or your care partner have signed paperwork which will be entered into your electronic medical record.  These signatures attest to the fact that that the information above on your After Visit Summary has been reviewed and is understood.  Full responsibility of the confidentiality of this discharge information lies with you and/or your care-partner.

## 2016-11-16 NOTE — Progress Notes (Signed)
Report to PACU, RN, vss, BBS= Clear.  

## 2016-11-17 ENCOUNTER — Telehealth: Payer: Self-pay | Admitting: *Deleted

## 2016-11-17 NOTE — Telephone Encounter (Signed)
  Follow up Call-  Call back number 11/16/2016  Post procedure Call Back phone  # [phone]  Permission to leave phone message Yes  Some recent data might be hidden     Spoke with pts husband, pt doing "great".  Asked him to let her know we called.

## 2016-11-24 ENCOUNTER — Encounter: Payer: Self-pay | Admitting: *Deleted

## 2016-11-24 NOTE — Telephone Encounter (Signed)
This was opened in error. Sm

## 2016-11-27 ENCOUNTER — Telehealth: Payer: Self-pay | Admitting: Cardiovascular Disease

## 2016-11-27 MED ORDER — METOPROLOL SUCCINATE ER 25 MG PO TB24: 12.5000 mg | ORAL_TABLET | Freq: Every day | ORAL | 11 refills | Status: DC

## 2016-11-27 NOTE — Telephone Encounter (Signed)
The fatigue may be due to the Toprol ( aothough its a low dose) Please have her cut the dose in 1/2. I'll see her back next week to follow up

## 2016-11-27 NOTE — Telephone Encounter (Signed)
New message    Pt is calling wanting to talk about medication that was given to her.   Pt c/o medication issue:  1. Name of Medication: metoprolol & atorvastatin   2. How are you currently taking this medication (dosage and times per day)? Once daily  3. Are you having a reaction (difficulty breathing--STAT)? Joint pain, headache, nausea, neck pain  4. What is your medication issue? Pt states she has started having problems since starting medicine. She said only feels good when she lays down.

## 2016-11-27 NOTE — Telephone Encounter (Signed)
Received call back from patient-reports since increasing her atorvastatin and starting toprol she is having fatigue, HA, joint pain.   States she only feels good when she is lying down.  States she usually walks 2-3 miles and is unable to do so now.  Reports she had some minor pain with the decreased dose of Lipitor but wasn't sure that it was due to the medication.  Reports she is so fatigued she is falling asleep when she sits down.  Patient reports she cannot take this medication anymore and decided to stop taking it this AM.  No BP HR to report, doesn't take at home.  Reports her BP "always runs low".   Patient has f/u 5/22 with Dr. Acie Fredrickson.  Advised I would route to Dr. Acie Fredrickson for further recommendations.

## 2016-11-27 NOTE — Telephone Encounter (Signed)
Attempt to call house # and cell #, no answer, [email or web address].

## 2016-11-27 NOTE — Telephone Encounter (Signed)
Left detailed message on home voicemaile (DPR on file).  Informed pt of Dr. Elmarie Shiley recommendation.  Reminded her of appt Tuesday.

## 2016-12-01 ENCOUNTER — Encounter: Payer: Self-pay | Admitting: Cardiovascular Disease

## 2016-12-01 ENCOUNTER — Ambulatory Visit (INDEPENDENT_AMBULATORY_CARE_PROVIDER_SITE_OTHER): Payer: Medicare Other | Admitting: Cardiovascular Disease

## 2016-12-01 ENCOUNTER — Telehealth: Payer: Self-pay | Admitting: Cardiovascular Disease

## 2016-12-01 VITALS — BP 102/84 | HR 67 | Ht 62.0 in | Wt 144.0 lb

## 2016-12-01 DIAGNOSIS — E7800 Pure hypercholesterolemia, unspecified: Secondary | ICD-10-CM

## 2016-12-01 DIAGNOSIS — I2510 Atherosclerotic heart disease of native coronary artery without angina pectoris: Secondary | ICD-10-CM

## 2016-12-01 LAB — BASIC METABOLIC PANEL
BUN/Creatinine Ratio: 23 (ref 12–28)
BUN: 22 mg/dL (ref 8–27)
CO2: 25 mmol/L (ref 18–29)
Calcium: 9.6 mg/dL (ref 8.7–10.3)
Chloride: 102 mmol/L (ref 96–106)
Creatinine, Ser: 0.95 mg/dL (ref 0.57–1.00)
GFR calc Af Amer: 71 mL/min/{1.73_m2} (ref >59)
GFR calc non Af Amer: 61 mL/min/{1.73_m2} (ref >59)
Glucose: 98 mg/dL (ref 65–99)
Potassium: 4.8 mmol/L (ref 3.5–5.2)
Sodium: 141 mmol/L (ref 134–144)

## 2016-12-01 MED ORDER — ASPIRIN EC 81 MG PO TBEC: 81.0000 mg | DELAYED_RELEASE_TABLET | Freq: Every day | ORAL | Status: DC

## 2016-12-01 NOTE — Patient Instructions (Signed)
Medication Instructions:  1) START ASPIRIN 81 mg daily  Labwork: TODAY: BMET  FASTING LABS in 3 months: BMET, LFTs, Lipids  Testing/Procedures: None  Follow-Up: Your physician wants you to follow-up in: 6 months with Dr. Acie Fredrickson. You will receive a reminder letter in the mail two months in advance. If you don't receive a letter, please call our office to schedule the follow-up appointment.   Any Other Special Instructions Will Be Listed Below (If Applicable).     If you need a refill on your cardiac medications before your next appointment, please call your pharmacy.

## 2016-12-01 NOTE — Progress Notes (Signed)
Cardiology Office Note   Date:  12/01/2016   ID:  [name], [name] [birth year], MRN [id number]  PCP:  Leighton Ruff, MD  Cardiologist:   Mertie Moores, MD   Chief Complaint  Patient presents with  . Coronary Artery Disease   Problem List 1. Chest pain     Oct. 24, 2017:   [name] is a 71 y.o. female who presents for exertional dyspnea / heaviness  started several years ago. Has seen Dr. Wynonia Lawman in the past.  Abnormal stress test  Cath Feb. 4, 2016 showed mid LAD stenosis of 40-50%.    she has exertional chest tightness.  Mid sternal pain, radiates to back of both arms, radiates up into her neck and then to upper chest   Has to stop and rest for the pain to go away.   Walking uphill is worse.   Occurs more often if she has eaten and then exercises.  Has been active and healthy,  Walks regularly . Walked 2 miles a day and has had to slow down. Has slowly gained weight but no sudden weight gain   Nov. 29, 2017:  [name] is doing ok Still having some CP especially if she eats after exercising .  Has CP if she exercises 30 minutes - 1 hour after eat The CP is a tightness in her throat, radiates to her neck, out  through to her shoulders and down both arms  The symptoms are better after starting carvedilol.   The symptoms do still occur though.  October 26, 2016:  [name] presents as a work in visit . Has been having some chest pressure ,  Had just awakened. Turned over in bed and the pain resolved.  Did not have to take a SL NTG. Had some dyspnea.   The following day , she and her husband were walking,  She develeoped CP , asssociated with increased dyspnea.  Had to stop and rest .  Walked slower and made it home ok Last several minutes.   Yesterday she had recurrent CP .  Took a SL NTG prior to walking ( had just eaten and she frequently has CP if she walks after eating . )  Took 2 SL NTG yesterday .  No CP this am .   Her last last cath was in  Feb. 2016.  Had mild - moderate CAD by cath in Feb. 2016.    Dec 01, 2016:  [name] is seen today for follow up of her moderate CAD.  Had a cath in October 27, 2016 showed mild coronary irregularities - LAD 30%. LV EF is hyperdynamic .   We started Toprol XL 12.5 mg last week.   Did not tolerate the Atorvastatin ( abdominal pains, leg, back muscle cramps) .    Seems to be doing well.   No CP.  Had  Has not been walking .     Past Medical History:  Diagnosis Date  . Anxiety   . Arthritis   . Asthma   . Bowel obstruction ([place])   . CAD (coronary artery disease), native coronary artery   . Colon polyps   . Depression   . Diverticulosis   . History of migraines   . Hyperlipidemia   . Irritable bowel syndrome   . Pneumonia     Past Surgical History:  Procedure Laterality Date  . Arm Lift Bilateral 09/23/2012  . BREAST ENHANCEMENT SURGERY    . CHOLECYSTECTOMY    . Correction  of Esotropic  June 06', May '10  . LEFT HEART CATH AND CORONARY ANGIOGRAPHY N/A 10/27/2016   Procedure: Left Heart Cath and Coronary Angiography;  Surgeon: Peter M Martinique, MD;  Location: [facility];  Service: Cardiovascular;  Laterality: N/A;  . LEFT HEART CATHETERIZATION WITH CORONARY ANGIOGRAM N/A 08/16/2014   Procedure: LEFT HEART CATHETERIZATION WITH CORONARY ANGIOGRAM;  Surgeon: Sinclair Grooms, MD;  [facility];  Service: Cardiovascular;  Laterality: N/A;  . PARTIAL COLECTOMY     in her 20's  . TONSILLECTOMY       Current Outpatient Prescriptions  Medication Sig Dispense Refill  . acetaminophen (TYLENOL) 500 MG tablet Take 1,000 mg by mouth as needed for moderate pain or headache.     . ALPRAZolam (XANAX) 0.5 MG tablet TAKE ONE TABLET BY MOUTH TWICE DAILY 60 tablet 5  . aspirin-acetaminophen-caffeine (EXCEDRIN MIGRAINE) 250-250-65 MG tablet Take 2 tablets by mouth every 6 (six) hours as needed for headache or migraine.    . diclofenac (VOLTAREN) 75 MG EC tablet Take 75 mg by mouth 2  (two) times daily as needed for mild pain.    Marland Kitchen FLUoxetine (PROZAC) 40 MG capsule TAKE ONE CAPSULE BY MOUTH ONCE DAILY 30 capsule 0  . metoprolol succinate (TOPROL XL) 25 MG 24 hr tablet Take 0.5 tablets (12.5 mg total) by mouth daily. 30 tablet 11  . omeprazole (PRILOSEC) 40 MG capsule Take 1 capsule (40 mg total) by mouth daily. 30 min before breakfast 30 capsule 3  . oxymetazoline (AFRIN) 0.05 % nasal spray Place 1-3 sprays into both nostrils at bedtime as needed for congestion.    . Tetrahydrozoline HCl (REDNESS RELIEVER EYE DROPS OP) Apply 1 drop to eye daily as needed (redness).    . traZODone (DESYREL) 50 MG tablet TAKE 1 TABLET BY MOUTH AT BEDTIME (QUALITEST BRAND ONLY [phone]) 30 tablet 5   Current Facility-Administered Medications  Medication Dose Route Frequency Provider Last Rate Last Dose  . 0.9 %  sodium chloride infusion  500 mL Intravenous Continuous Nandigam, Venia Minks, MD        Allergies:   Codeine; Shellfish allergy; Amoxicillin-pot clavulanate; Crestor [rosuvastatin calcium]; and Lipitor [atorvastatin]    Social History:  The patient  reports that she quit smoking about 6 years ago. She has never used smokeless tobacco. She reports that she does not drink alcohol or use drugs.   Family History:  The patient's family history includes Colon cancer in her maternal grandfather; Coronary artery disease in her father, mother, and sister; Diabetes in her sister; Heart disease in her father and mother; Hyperlipidemia in her mother; Hypertension in her mother; Peripheral vascular disease in her sister; Stroke (age of onset: 26) in her sister.    ROS:  Please see the history of present illness.    Review of Systems: Constitutional:  denies fever, chills, diaphoresis, appetite change and fatigue.  HEENT: denies photophobia, eye pain, redness, hearing loss, ear pain, congestion, sore throat, rhinorrhea, sneezing, neck pain, neck stiffness and tinnitus.  Respiratory: denies  SOB, DOE, cough, chest tightness, and wheezing.  Cardiovascular: admits to chest pain, palpitations and leg swelling.  Gastrointestinal: denies nausea, vomiting, abdominal pain, diarrhea, constipation, blood in stool.  Genitourinary: denies dysuria, urgency, frequency, hematuria, flank pain and difficulty urinating.  Musculoskeletal: denies  myalgias, back pain, joint swelling, arthralgias and gait problem.   Skin: denies pallor, rash and wound.  Neurological: denies dizziness, seizures, syncope, weakness, light-headedness, numbness and headaches.   Hematological:  denies adenopathy, easy bruising, personal or family bleeding history.  Psychiatric/ Behavioral: denies suicidal ideation, mood changes, confusion, nervousness, sleep disturbance and agitation.      All other systems are reviewed and negative.   PHYSICAL EXAM: VS:  BP 102/84   Pulse 67   Ht 5\' 2"  (1.575 m)   Wt 144 lb (65.3 kg)   SpO2 97%   BMI 26.34 kg/m  , BMI Body mass index is 26.34 kg/m. GEN: Well nourished, well developed, in no acute distress  HEENT: normal  Neck: no JVD, carotid bruits, or masses Cardiac: RRR; no murmurs, rubs, or gallops,no edema  Respiratory:  clear to auscultation bilaterally, normal work of breathing GI: soft, nontender, nondistended, + BS MS: no deformity or atrophy  Skin: warm and dry, no rash Neuro:  Strength and sensation are intact Psych: normal  EKG:  EKG is not ordered today.  Recent Labs: 10/26/2016: BUN 21; Creatinine, Ser 1.13; Platelets 283; Potassium 5.4; Sodium 142    Lipid Panel    Component Value Date/Time   CHOL 189 04/24/2010 1413   TRIG 66.0 04/24/2010 1413   HDL 61.30 04/24/2010 1413   CHOLHDL 3 04/24/2010 1413   VLDL 13.2 04/24/2010 1413   LDLCALC 115 (H) 04/24/2010 1413   LDLDIRECT 125.2 08/08/2009 1600      Wt Readings from Last 3 Encounters:  12/01/16 144 lb (65.3 kg)  11/16/16 143 lb (64.9 kg)  11/05/16 143 lb (64.9 kg)      Other studies  Reviewed: Additional studies/ records that were reviewed today include: . Review of the above records demonstrates:    ASSESSMENT AND PLAN:  1.  Chest pain . Continue ASA 81 mg a day Will aim for a goal LDL of 70-80 Recheck labs in 3 months   2. Hyperlipidemia: Her last LDL is 115. She does have moderate coronary artery disease.   She did not tolerate the Atorvastatin and does not want to try any other statin medication. We'll check labs in about 3 months. We will consider a referral to the lipid clinic if needed.   I'll see her again in 6 months for follow-up visit.   Mertie Moores, MD  12/01/2016 9:01 AM    [facility] [place], [place], [place]  [id number] Phone: [phone]; Fax: [phone]

## 2016-12-01 NOTE — Telephone Encounter (Signed)
Patient called to report she had labs at her PCP last June she would bring to the office for Dr. Acie Fredrickson to review. She also wanted to know if she had to have repeat labs drawn in August since she had these drawn 12/2015. Informed patient that Dr. Drema Dallas would be called and labs would be requested to save the patient a trip. Informed her that repeat labs will still need to be drawn as it will be over a year's time at her lab appointment 03/05/17. She was grateful for call and agrees with treatment plan.  Labs requested.

## 2016-12-01 NOTE — Telephone Encounter (Signed)
New message      Calling to give nurse lab info from her PCP dated 01-10-2016.  Pt was seen this am

## 2016-12-08 ENCOUNTER — Ambulatory Visit (INDEPENDENT_AMBULATORY_CARE_PROVIDER_SITE_OTHER): Payer: Medicare Other

## 2016-12-08 ENCOUNTER — Ambulatory Visit (INDEPENDENT_AMBULATORY_CARE_PROVIDER_SITE_OTHER): Payer: Medicare Other | Admitting: Orthopaedic Surgery

## 2016-12-08 DIAGNOSIS — M79605 Pain in left leg: Secondary | ICD-10-CM

## 2016-12-08 DIAGNOSIS — M79604 Pain in right leg: Secondary | ICD-10-CM

## 2016-12-08 DIAGNOSIS — M25551 Pain in right hip: Secondary | ICD-10-CM

## 2016-12-08 DIAGNOSIS — M25552 Pain in left hip: Secondary | ICD-10-CM | POA: Diagnosis not present

## 2016-12-08 NOTE — Progress Notes (Signed)
Office Visit Note   Patient: [name]           Date of Birth: [birth year]           MRN: [id number] Visit Date: 12/08/2016              Requested by: Leighton Ruff, MD [place], [place] [id number] PCP: Leighton Ruff, MD   Assessment & Plan: Visit Diagnoses:  1. Bilateral leg pain   2. Pain of both hip joints     Plan:  We went over x-rays in detail and all questions were encouraged and answered. She does not have any significant pathology of either hip to explain her pain. I do feel this is a back issue and this gave her some relief knowing that that's more of the issue and not her hips itself. All questions were encouraged and answered. She'll follow up with her other physician in regards to her back  Follow-Up Instructions: Return if symptoms worsen or fail to improve.   Orders:  Orders Placed This Encounter  Procedures  . XR HIPS BILAT W OR W/O PELVIS 2V   No orders of the defined types were placed in this encounter.     Procedures: No procedures performed   Clinical Data: No additional findings.   Subjective: No chief complaint on file. The patient comes in today for evaluation of what she feels like maybe bilateral hip pain. However she said this visit is mainly a process of elimination. She's been dealing with low back issues and sciatica for several years now. She sees a physiatry is to another group in town who have said if these back injections are working that she may need back surgery. She fell this may be a hip issue should she want to me to evaluate her hips as a process of elimination to make sure this is not an issue for her. She has have some groin pain but it radiates to her knee and down her backside more than anything. She denies any change in bowel or bladder function. It is aggravating type of pain and hurts whether she is weightbearing or not. It is detrimentally affected her activities daily living, her quality of life,  her mobility. The other physician's office has the MRI report lumbar spine which I do not have today. She said again she's had multiple back injections.  HPI  Review of Systems She currently denies any headache, chest pain, shortness of breath, fever, chills, nausea, vomiting.  Objective: Vital Signs: There were no vitals taken for this visit.  Physical Exam She is alert and oriented 3 and in no acute distress. She gets up on the exam table easily. Ortho Exam Examination of both hips show a nice and fluid range of motion with full external and internal rotation with no pain in the groin at all either hip. She has no trochanteric pain of either hip. She is 5 out of 5 hip strength is well. Her leg lengths are equal. Specialty Comments:  No specialty comments available.  Imaging: Xr Hips Bilat W Or W/o Pelvis 2v  Result Date: 12/08/2016 An AP pelvis and bilateral hips show no significant hip arthritis at all. The hip joints are well maintained with no significant sclerotic changes at all. There is no para-articular osteophytes in the femoral heads are nice and spherical.    PMFS History: Patient Active Problem List   Diagnosis Date Noted  . Chest pain 10/27/2016  . Unstable angina (  [place])   . CAD (coronary artery disease), native coronary artery   . Anxiety   . Irritable bowel syndrome   . Depression   . Hyperlipidemia   . History of migraines    Past Medical History:  Diagnosis Date  . Anxiety   . Arthritis   . Asthma   . Bowel obstruction ([place])   . CAD (coronary artery disease), native coronary artery   . Colon polyps   . Depression   . Diverticulosis   . History of migraines   . Hyperlipidemia   . Irritable bowel syndrome   . Pneumonia     Family History  Problem Relation Age of Onset  . Heart disease Mother        CHF  . Coronary artery disease Mother   . Hypertension Mother   . Hyperlipidemia Mother   . Heart disease Father        CHF  . Coronary artery  disease Father   . Diabetes Sister        with all the complications  . Coronary artery disease Sister   . Stroke Sister 34       CVA  . Peripheral vascular disease Sister   . Colon cancer Maternal Grandfather   . Cancer Neg Hx        breast or colon    Past Surgical History:  Procedure Laterality Date  . Arm Lift Bilateral 09/23/2012  . BREAST ENHANCEMENT SURGERY    . CHOLECYSTECTOMY    . Correction of Esotropic  June 06', May '10  . LEFT HEART CATH AND CORONARY ANGIOGRAPHY N/A 10/27/2016   Procedure: Left Heart Cath and Coronary Angiography;  Surgeon: Peter M Martinique, MD;  Location: [facility];  Service: Cardiovascular;  Laterality: N/A;  . LEFT HEART CATHETERIZATION WITH CORONARY ANGIOGRAM N/A 08/16/2014   Procedure: LEFT HEART CATHETERIZATION WITH CORONARY ANGIOGRAM;  Surgeon: Sinclair Grooms, MD;  Location: [facility] CATH LAB;  Service: Cardiovascular;  Laterality: N/A;  . PARTIAL COLECTOMY     in her 20's  . TONSILLECTOMY     Social History   Occupational History  . 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 Smoking status: Former Smoker    Quit date: 01/10/2010  . Smokeless tobacco: Never Used  . Alcohol use No  . Drug use: No  . Sexual activity: Yes    Partners: Female

## 2017-02-19 ENCOUNTER — Ambulatory Visit: Payer: Medicare Other | Admitting: Neurology

## 2017-02-24 ENCOUNTER — Other Ambulatory Visit: Payer: Medicare Other | Admitting: *Deleted

## 2017-02-24 DIAGNOSIS — I2510 Atherosclerotic heart disease of native coronary artery without angina pectoris: Secondary | ICD-10-CM

## 2017-02-24 DIAGNOSIS — E7800 Pure hypercholesterolemia, unspecified: Secondary | ICD-10-CM

## 2017-02-24 LAB — BASIC METABOLIC PANEL
BUN/Creatinine Ratio: 21 (ref 12–28)
BUN: 23 mg/dL (ref 8–27)
CO2: 25 mmol/L (ref 20–29)
Calcium: 10 mg/dL (ref 8.7–10.3)
Chloride: 103 mmol/L (ref 96–106)
Creatinine, Ser: 1.08 mg/dL — ABNORMAL HIGH (ref 0.57–1.00)
GFR calc Af Amer: 61 mL/min/{1.73_m2} (ref >59)
GFR calc non Af Amer: 53 mL/min/{1.73_m2} — ABNORMAL LOW (ref >59)
Glucose: 96 mg/dL (ref 65–99)
Potassium: 4.5 mmol/L (ref 3.5–5.2)
Sodium: 142 mmol/L (ref 134–144)

## 2017-02-24 LAB — LIPID PANEL
Chol/HDL Ratio: 4.1 ratio (ref 0.0–4.4)
Cholesterol, Total: 264 mg/dL — ABNORMAL HIGH (ref 100–199)
HDL: 64 mg/dL (ref >39)
LDL Calculated: 177 mg/dL — ABNORMAL HIGH (ref 0–99)
Triglycerides: 114 mg/dL (ref 0–149)
VLDL Cholesterol Cal: 23 mg/dL (ref 5–40)

## 2017-02-24 LAB — HEPATIC FUNCTION PANEL
ALT: 13 IU/L (ref 0–32)
AST: 19 IU/L (ref 0–40)
Albumin: 4.4 g/dL (ref 3.6–4.8)
Alkaline Phosphatase: 31 IU/L — ABNORMAL LOW (ref 39–117)
Bilirubin Total: 0.4 mg/dL (ref 0.0–1.2)
Bilirubin, Direct: 0.11 mg/dL (ref 0.00–0.40)
Total Protein: 6.4 g/dL (ref 6.0–8.5)

## 2017-02-25 ENCOUNTER — Telehealth: Payer: Self-pay

## 2017-02-25 NOTE — Telephone Encounter (Signed)
Spoke with patient about recent lab work results. Patient verbalized understanding and agreed to continue with current treatment plan.

## 2017-02-25 NOTE — Telephone Encounter (Signed)
-----   Message from Thayer Headings, MD sent at 02/25/2017 10:12 AM EDT ----- Labs are stable

## 2017-03-05 ENCOUNTER — Other Ambulatory Visit: Payer: Medicare Other

## 2017-03-08 ENCOUNTER — Inpatient Hospital Stay (HOSPITAL_COMMUNITY)
Admission: EM | Admit: 2017-03-08 | Discharge: 2017-03-11 | DRG: 494 | Disposition: A | Discharge status: Home Health | Payer: Medicare Other | Attending: Orthopedic Surgery | Admitting: Orthopedic Surgery

## 2017-03-08 ENCOUNTER — Emergency Department (HOSPITAL_COMMUNITY): Payer: Medicare Other | Admitting: Certified Registered"

## 2017-03-08 ENCOUNTER — Emergency Department (HOSPITAL_COMMUNITY): Payer: Medicare Other

## 2017-03-08 ENCOUNTER — Encounter (HOSPITAL_COMMUNITY): Payer: Self-pay | Admitting: Emergency Medicine

## 2017-03-08 ENCOUNTER — Encounter (HOSPITAL_COMMUNITY)
Admission: EM | Disposition: A | Discharge status: Home Health | Payer: Self-pay | Source: Home / Self Care | Attending: Orthopedic Surgery

## 2017-03-08 ENCOUNTER — Other Ambulatory Visit (INDEPENDENT_AMBULATORY_CARE_PROVIDER_SITE_OTHER): Payer: Self-pay | Admitting: Family

## 2017-03-08 DIAGNOSIS — Z885 Allergy status to narcotic agent status: Secondary | ICD-10-CM

## 2017-03-08 DIAGNOSIS — S82872A Displaced pilon fracture of left tibia, initial encounter for closed fracture: Secondary | ICD-10-CM | POA: Diagnosis present

## 2017-03-08 DIAGNOSIS — Z87891 Personal history of nicotine dependence: Secondary | ICD-10-CM

## 2017-03-08 DIAGNOSIS — F419 Anxiety disorder, unspecified: Secondary | ICD-10-CM | POA: Diagnosis present

## 2017-03-08 DIAGNOSIS — E785 Hyperlipidemia, unspecified: Secondary | ICD-10-CM | POA: Diagnosis present

## 2017-03-08 DIAGNOSIS — M25572 Pain in left ankle and joints of left foot: Secondary | ICD-10-CM | POA: Diagnosis not present

## 2017-03-08 DIAGNOSIS — W19XXXA Unspecified fall, initial encounter: Secondary | ICD-10-CM

## 2017-03-08 DIAGNOSIS — F329 Major depressive disorder, single episode, unspecified: Secondary | ICD-10-CM | POA: Diagnosis present

## 2017-03-08 DIAGNOSIS — Z880 Allergy status to penicillin: Secondary | ICD-10-CM

## 2017-03-08 DIAGNOSIS — Z881 Allergy status to other antibiotic agents status: Secondary | ICD-10-CM

## 2017-03-08 DIAGNOSIS — Z79899 Other long term (current) drug therapy: Secondary | ICD-10-CM

## 2017-03-08 DIAGNOSIS — S82202A Unspecified fracture of shaft of left tibia, initial encounter for closed fracture: Secondary | ICD-10-CM | POA: Diagnosis not present

## 2017-03-08 DIAGNOSIS — S82402A Unspecified fracture of shaft of left fibula, initial encounter for closed fracture: Principal | ICD-10-CM | POA: Diagnosis present

## 2017-03-08 DIAGNOSIS — Z888 Allergy status to other drugs, medicaments and biological substances status: Secondary | ICD-10-CM

## 2017-03-08 DIAGNOSIS — I2510 Atherosclerotic heart disease of native coronary artery without angina pectoris: Secondary | ICD-10-CM | POA: Diagnosis present

## 2017-03-08 DIAGNOSIS — W1830XA Fall on same level, unspecified, initial encounter: Secondary | ICD-10-CM | POA: Diagnosis present

## 2017-03-08 DIAGNOSIS — J45909 Unspecified asthma, uncomplicated: Secondary | ICD-10-CM | POA: Diagnosis present

## 2017-03-08 DIAGNOSIS — Z7982 Long term (current) use of aspirin: Secondary | ICD-10-CM

## 2017-03-08 HISTORY — PX: ORIF ANKLE FRACTURE: SUR919

## 2017-03-08 HISTORY — DX: Low back pain, unspecified: M54.50

## 2017-03-08 HISTORY — PX: EXTERNAL FIXATION LEG: SHX1549

## 2017-03-08 HISTORY — DX: Adverse effect of unspecified anesthetic, initial encounter: T41.45XA

## 2017-03-08 HISTORY — DX: Other complications of anesthesia, initial encounter: T88.59XA

## 2017-03-08 HISTORY — DX: Migraine, unspecified, not intractable, without status migrainosus: G43.909

## 2017-03-08 HISTORY — DX: Other chronic pain: G89.29

## 2017-03-08 HISTORY — DX: Cervicalgia: M54.2

## 2017-03-08 HISTORY — DX: Low back pain: M54.5

## 2017-03-08 LAB — CBC
HCT: 39.7 % (ref 36.0–46.0)
Hemoglobin: 13.2 g/dL (ref 12.0–15.0)
MCH: 30.4 pg (ref 26.0–34.0)
MCHC: 33.2 g/dL (ref 30.0–36.0)
MCV: 91.5 fL (ref 78.0–100.0)
Platelets: 229 10*3/uL (ref 150–400)
RBC: 4.34 MIL/uL (ref 3.87–5.11)
RDW: 12 % (ref 11.5–15.5)
WBC: 12.3 10*3/uL — ABNORMAL HIGH (ref 4.0–10.5)

## 2017-03-08 LAB — BASIC METABOLIC PANEL
Anion gap: 7 (ref 5–15)
BUN: 23 mg/dL — ABNORMAL HIGH (ref 6–20)
CO2: 25 mmol/L (ref 22–32)
Calcium: 8.9 mg/dL (ref 8.9–10.3)
Chloride: 107 mmol/L (ref 101–111)
Creatinine, Ser: 1.08 mg/dL — ABNORMAL HIGH (ref 0.44–1.00)
GFR calc Af Amer: 59 mL/min — ABNORMAL LOW (ref >60)
GFR calc non Af Amer: 51 mL/min — ABNORMAL LOW (ref >60)
Glucose, Bld: 111 mg/dL — ABNORMAL HIGH (ref 65–99)
Potassium: 4.2 mmol/L (ref 3.5–5.1)
Sodium: 139 mmol/L (ref 135–145)

## 2017-03-08 SURGERY — EXTERNAL FIXATION, LOWER EXTREMITY
Anesthesia: Regional | Site: Ankle | Laterality: Left

## 2017-03-08 MED ORDER — METOCLOPRAMIDE HCL 5 MG/ML IJ SOLN: 5.0000 mg | Freq: Three times a day (TID) | INTRAMUSCULAR | Status: DC | PRN

## 2017-03-08 MED ORDER — CLINDAMYCIN PHOSPHATE 600 MG/50ML IV SOLN
600.0000 mg | Freq: Four times a day (QID) | INTRAVENOUS | Status: AC
  Administered 2017-03-08 – 2017-03-09 (×3): 600 mg via INTRAVENOUS
  Filled 2017-03-08 (×3): qty 50

## 2017-03-08 MED ORDER — METHOCARBAMOL 1000 MG/10ML IJ SOLN
500.0000 mg | Freq: Four times a day (QID) | INTRAMUSCULAR | Status: DC | PRN
  Filled 2017-03-08: qty 5

## 2017-03-08 MED ORDER — METOCLOPRAMIDE HCL 5 MG PO TABS: 5.0000 mg | ORAL_TABLET | Freq: Three times a day (TID) | ORAL | Status: DC | PRN

## 2017-03-08 MED ORDER — MUPIROCIN 2 % EX OINT
TOPICAL_OINTMENT | CUTANEOUS | Status: AC
  Filled 2017-03-08: qty 22

## 2017-03-08 MED ORDER — OXYCODONE HCL 5 MG/5ML PO SOLN: 5.0000 mg | Freq: Once | ORAL | Status: DC | PRN

## 2017-03-08 MED ORDER — HYDROMORPHONE HCL 1 MG/ML IJ SOLN
1.0000 mg | INTRAMUSCULAR | Status: DC | PRN
  Administered 2017-03-09 – 2017-03-10 (×5): 1 mg via INTRAVENOUS
  Filled 2017-03-08 (×5): qty 1

## 2017-03-08 MED ORDER — ONDANSETRON HCL 4 MG/2ML IJ SOLN
INTRAMUSCULAR | Status: DC | PRN
  Administered 2017-03-08: 4 mg via INTRAVENOUS

## 2017-03-08 MED ORDER — ONDANSETRON HCL 4 MG PO TABS: 4.0000 mg | ORAL_TABLET | Freq: Four times a day (QID) | ORAL | Status: DC | PRN

## 2017-03-08 MED ORDER — FENTANYL CITRATE (PF) 100 MCG/2ML IJ SOLN
INTRAMUSCULAR | Status: DC | PRN
  Administered 2017-03-08: 50 ug via INTRAVENOUS

## 2017-03-08 MED ORDER — DEXAMETHASONE SODIUM PHOSPHATE 10 MG/ML IJ SOLN
INTRAMUSCULAR | Status: AC
  Filled 2017-03-08: qty 1

## 2017-03-08 MED ORDER — ALPRAZOLAM 0.5 MG PO TABS
0.5000 mg | ORAL_TABLET | Freq: Two times a day (BID) | ORAL | Status: DC
  Administered 2017-03-08 – 2017-03-11 (×6): 0.5 mg via ORAL
  Filled 2017-03-08 (×6): qty 1

## 2017-03-08 MED ORDER — BUPIVACAINE-EPINEPHRINE (PF) 0.5% -1:200000 IJ SOLN
INTRAMUSCULAR | Status: DC | PRN
  Administered 2017-03-08: 15 mL via PERINEURAL
  Administered 2017-03-08: 25 mL via PERINEURAL

## 2017-03-08 MED ORDER — OXYCODONE HCL 5 MG PO TABS: 5.0000 mg | ORAL_TABLET | Freq: Once | ORAL | Status: DC | PRN

## 2017-03-08 MED ORDER — MAGNESIUM CITRATE PO SOLN: 1.0000 | Freq: Once | ORAL | Status: DC | PRN

## 2017-03-08 MED ORDER — PROPOFOL 10 MG/ML IV BOLUS
INTRAVENOUS | Status: DC | PRN
  Administered 2017-03-08: 100 mg via INTRAVENOUS
  Administered 2017-03-08: 50 mg via INTRAVENOUS

## 2017-03-08 MED ORDER — METHOCARBAMOL 500 MG PO TABS
500.0000 mg | ORAL_TABLET | Freq: Four times a day (QID) | ORAL | Status: DC | PRN
  Administered 2017-03-08 – 2017-03-11 (×5): 500 mg via ORAL
  Filled 2017-03-08 (×6): qty 1

## 2017-03-08 MED ORDER — ONDANSETRON HCL 4 MG/2ML IJ SOLN: 4.0000 mg | Freq: Four times a day (QID) | INTRAMUSCULAR | Status: DC | PRN

## 2017-03-08 MED ORDER — CLINDAMYCIN PHOSPHATE 900 MG/50ML IV SOLN
INTRAVENOUS | Status: AC
  Filled 2017-03-08: qty 50

## 2017-03-08 MED ORDER — PHENYLEPHRINE 40 MCG/ML (10ML) SYRINGE FOR IV PUSH (FOR BLOOD PRESSURE SUPPORT)
PREFILLED_SYRINGE | INTRAVENOUS | Status: AC
  Filled 2017-03-08: qty 10

## 2017-03-08 MED ORDER — MIDAZOLAM HCL 2 MG/2ML IJ SOLN
INTRAMUSCULAR | Status: AC
  Filled 2017-03-08: qty 2

## 2017-03-08 MED ORDER — TRAZODONE HCL 50 MG PO TABS
50.0000 mg | ORAL_TABLET | Freq: Every evening | ORAL | Status: DC | PRN
Start: 2017-03-08 — End: 2017-03-11
  Administered 2017-03-08 – 2017-03-10 (×3): 50 mg via ORAL
  Filled 2017-03-08 (×3): qty 1

## 2017-03-08 MED ORDER — ONDANSETRON HCL 4 MG/2ML IJ SOLN
4.0000 mg | Freq: Once | INTRAMUSCULAR | Status: AC
  Administered 2017-03-08: 4 mg via INTRAVENOUS
  Filled 2017-03-08: qty 2

## 2017-03-08 MED ORDER — MUPIROCIN 2 % EX OINT
TOPICAL_OINTMENT | Freq: Two times a day (BID) | CUTANEOUS | Status: DC
  Administered 2017-03-08: 16:00:00 via NASAL

## 2017-03-08 MED ORDER — DOCUSATE SODIUM 100 MG PO CAPS
100.0000 mg | ORAL_CAPSULE | Freq: Two times a day (BID) | ORAL | Status: DC
  Administered 2017-03-08 – 2017-03-11 (×6): 100 mg via ORAL
  Filled 2017-03-08 (×6): qty 1

## 2017-03-08 MED ORDER — MORPHINE SULFATE (PF) 4 MG/ML IV SOLN
4.0000 mg | Freq: Once | INTRAVENOUS | Status: AC
  Administered 2017-03-08: 4 mg via INTRAVENOUS
  Filled 2017-03-08: qty 1

## 2017-03-08 MED ORDER — ACETAMINOPHEN 325 MG PO TABS
650.0000 mg | ORAL_TABLET | Freq: Four times a day (QID) | ORAL | Status: DC | PRN
  Administered 2017-03-10: 650 mg via ORAL
  Filled 2017-03-08: qty 2

## 2017-03-08 MED ORDER — 0.9 % SODIUM CHLORIDE (POUR BTL) OPTIME
TOPICAL | Status: DC | PRN
  Administered 2017-03-08: 1000 mL

## 2017-03-08 MED ORDER — ASPIRIN EC 325 MG PO TBEC
325.0000 mg | DELAYED_RELEASE_TABLET | Freq: Every day | ORAL | Status: DC
  Administered 2017-03-08 – 2017-03-11 (×4): 325 mg via ORAL
  Filled 2017-03-08 (×4): qty 1

## 2017-03-08 MED ORDER — DEXAMETHASONE SODIUM PHOSPHATE 10 MG/ML IJ SOLN
INTRAMUSCULAR | Status: DC | PRN
  Administered 2017-03-08: 10 mg via INTRAVENOUS

## 2017-03-08 MED ORDER — BISACODYL 10 MG RE SUPP: 10.0000 mg | Freq: Every day | RECTAL | Status: DC | PRN

## 2017-03-08 MED ORDER — CHLORHEXIDINE GLUCONATE 4 % EX LIQD: 60.0000 mL | Freq: Once | CUTANEOUS | Status: DC

## 2017-03-08 MED ORDER — ACETAMINOPHEN 650 MG RE SUPP: 650.0000 mg | Freq: Four times a day (QID) | RECTAL | Status: DC | PRN

## 2017-03-08 MED ORDER — FLUOXETINE HCL 20 MG PO CAPS
40.0000 mg | ORAL_CAPSULE | Freq: Every day | ORAL | Status: DC
  Administered 2017-03-09 – 2017-03-11 (×3): 40 mg via ORAL
  Filled 2017-03-08 (×3): qty 2

## 2017-03-08 MED ORDER — POLYETHYLENE GLYCOL 3350 17 G PO PACK: 17.0000 g | PACK | Freq: Every day | ORAL | Status: DC | PRN

## 2017-03-08 MED ORDER — PHENYLEPHRINE 40 MCG/ML (10ML) SYRINGE FOR IV PUSH (FOR BLOOD PRESSURE SUPPORT)
PREFILLED_SYRINGE | INTRAVENOUS | Status: DC | PRN
  Administered 2017-03-08: 40 ug via INTRAVENOUS
  Administered 2017-03-08: 120 ug via INTRAVENOUS
  Administered 2017-03-08 (×2): 80 ug via INTRAVENOUS
  Administered 2017-03-08: 40 ug via INTRAVENOUS
  Administered 2017-03-08 (×2): 80 ug via INTRAVENOUS
  Administered 2017-03-08: 120 ug via INTRAVENOUS
  Administered 2017-03-08 (×2): 80 ug via INTRAVENOUS

## 2017-03-08 MED ORDER — KETOROLAC TROMETHAMINE 15 MG/ML IJ SOLN
7.5000 mg | Freq: Four times a day (QID) | INTRAMUSCULAR | Status: AC
  Administered 2017-03-08 – 2017-03-09 (×4): 7.5 mg via INTRAVENOUS
  Filled 2017-03-08 (×4): qty 1

## 2017-03-08 MED ORDER — CEFAZOLIN SODIUM-DEXTROSE 1-4 GM/50ML-% IV SOLN
1.0000 g | Freq: Once | INTRAVENOUS | Status: AC
  Administered 2017-03-08: 1 g via INTRAVENOUS
  Filled 2017-03-08: qty 50

## 2017-03-08 MED ORDER — SODIUM CHLORIDE 0.9 % IR SOLN
Status: DC | PRN
  Administered 2017-03-08: 3000 mL

## 2017-03-08 MED ORDER — FENTANYL CITRATE (PF) 100 MCG/2ML IJ SOLN
25.0000 ug | INTRAMUSCULAR | Status: DC | PRN
Start: 2017-03-08 — End: 2017-03-08

## 2017-03-08 MED ORDER — MIDAZOLAM HCL 2 MG/2ML IJ SOLN
INTRAMUSCULAR | Status: DC | PRN
  Administered 2017-03-08 (×2): 1 mg via INTRAVENOUS

## 2017-03-08 MED ORDER — CLINDAMYCIN PHOSPHATE 900 MG/50ML IV SOLN
900.0000 mg | INTRAVENOUS | Status: AC
  Administered 2017-03-08: 900 mg via INTRAVENOUS

## 2017-03-08 MED ORDER — METOPROLOL SUCCINATE ER 25 MG PO TB24
12.5000 mg | ORAL_TABLET | Freq: Every day | ORAL | Status: DC
  Administered 2017-03-09 – 2017-03-11 (×3): 12.5 mg via ORAL
  Filled 2017-03-08 (×3): qty 1

## 2017-03-08 MED ORDER — LACTATED RINGERS IV SOLN
INTRAVENOUS | Status: DC
  Administered 2017-03-08 (×2): via INTRAVENOUS

## 2017-03-08 MED ORDER — SUCCINYLCHOLINE CHLORIDE 20 MG/ML IJ SOLN
INTRAMUSCULAR | Status: DC | PRN
  Administered 2017-03-08: 60 mg via INTRAVENOUS

## 2017-03-08 MED ORDER — SODIUM CHLORIDE 0.9 % IV SOLN
INTRAVENOUS | Status: DC
  Administered 2017-03-08: 21:00:00 via INTRAVENOUS

## 2017-03-08 MED ORDER — FENTANYL CITRATE (PF) 250 MCG/5ML IJ SOLN
INTRAMUSCULAR | Status: AC
  Filled 2017-03-08: qty 5

## 2017-03-08 MED ORDER — WHITE PETROLATUM GEL
Status: AC
  Administered 2017-03-08: 1
  Filled 2017-03-08: qty 1

## 2017-03-08 MED ORDER — ONDANSETRON HCL 4 MG/2ML IJ SOLN
INTRAMUSCULAR | Status: AC
  Filled 2017-03-08: qty 2

## 2017-03-08 MED ORDER — SUCCINYLCHOLINE CHLORIDE 200 MG/10ML IV SOSY
PREFILLED_SYRINGE | INTRAVENOUS | Status: AC
  Filled 2017-03-08: qty 10

## 2017-03-08 MED ORDER — OXYCODONE HCL 5 MG PO TABS
5.0000 mg | ORAL_TABLET | ORAL | Status: DC | PRN
  Filled 2017-03-08: qty 2
  Filled 2017-03-08: qty 1

## 2017-03-08 SURGICAL SUPPLY — 63 items
BAG DECANTER FOR FLEXI CONT (MISCELLANEOUS) IMPLANT
BANDAGE ACE 4X5 VEL STRL LF (GAUZE/BANDAGES/DRESSINGS) ×3 IMPLANT
BANDAGE ACE 6X5 VEL STRL LF (GAUZE/BANDAGES/DRESSINGS) ×3 IMPLANT
BIT DRILL 2.5 X LONG (BIT) ×1
BIT DRILL LCP QC 2X140 (BIT) ×2 IMPLANT
BIT DRILL X LONG 2.5 (BIT) IMPLANT
COVER SURGICAL LIGHT HANDLE (MISCELLANEOUS) ×5 IMPLANT
DRAPE HALF SHEET 40X57 (DRAPES) ×6 IMPLANT
DRAPE INCISE IOBAN 66X45 STRL (DRAPES) ×4 IMPLANT
DRAPE OEC MINIVIEW 54X84 (DRAPES) IMPLANT
DRAPE U-SHAPE 47X51 STRL (DRAPES) ×3 IMPLANT
DRESSING PREVENA PLUS CUSTOM (GAUZE/BANDAGES/DRESSINGS) IMPLANT
DRILL BIT X LONG 2.5 (BIT) ×3
DRSG ADAPTIC 3X8 NADH LF (GAUZE/BANDAGES/DRESSINGS) ×3 IMPLANT
DRSG PAD ABDOMINAL 8X10 ST (GAUZE/BANDAGES/DRESSINGS) ×3 IMPLANT
DRSG PREVENA PLUS CUSTOM (GAUZE/BANDAGES/DRESSINGS) ×3
DURAPREP 26ML APPLICATOR (WOUND CARE) ×3 IMPLANT
ELECT REM PT RETURN 9FT ADLT (ELECTROSURGICAL) ×3
ELECTRODE REM PT RTRN 9FT ADLT (ELECTROSURGICAL) ×1 IMPLANT
GLOVE SURG ORTHO 9.0 STRL STRW (GLOVE) ×3 IMPLANT
GOWN STRL REUS W/ TWL XL LVL3 (GOWN DISPOSABLE) ×3 IMPLANT
GOWN STRL REUS W/TWL XL LVL3 (GOWN DISPOSABLE) ×9
HANDPIECE INTERPULSE COAX TIP (DISPOSABLE) ×3
KIT BASIN OR (CUSTOM PROCEDURE TRAY) ×3 IMPLANT
KIT DRSG PREVENA PLUS 7DAY 125 (MISCELLANEOUS) ×2 IMPLANT
KIT ROOM TURNOVER OR (KITS) ×3 IMPLANT
MANIFOLD NEPTUNE II (INSTRUMENTS) ×3 IMPLANT
NS IRRIG 1000ML POUR BTL (IV SOLUTION) ×3 IMPLANT
PACK ORTHO EXTREMITY (CUSTOM PROCEDURE TRAY) ×3 IMPLANT
PAD ARMBOARD 7.5X6 YLW CONV (MISCELLANEOUS) ×6 IMPLANT
PAD CAST 4YDX4 CTTN HI CHSV (CAST SUPPLIES) ×1 IMPLANT
PADDING CAST COTTON 4X4 STRL (CAST SUPPLIES) ×3
PENCIL BUTTON HOLSTER BLD 10FT (ELECTRODE) ×3 IMPLANT
PLATE BONE DISTAL TIBIAL H8 LT (Plate) ×2 IMPLANT
PLATE LCP 3.5 1/3 TUB 6HX69 (Plate) ×2 IMPLANT
SCREW 2.7X40MM (Screw) ×2 IMPLANT
SCREW CORTEX 3.5 12MM (Screw) ×4 IMPLANT
SCREW CORTEX LOW PRO 3.5X26 (Screw) ×6 IMPLANT
SCREW CORTEX LOW PRO 3.5X28 (Screw) ×2 IMPLANT
SCREW CORTEX LOW PRO 3.5X30 (Screw) ×2 IMPLANT
SCREW CORTEX LOW PRO 3.5X32 (Screw) ×2 IMPLANT
SCREW LOCK CORT ST 3.5X12 (Screw) IMPLANT
SCREW LOCK T15 FT 14X3.5X2.9X (Screw) IMPLANT
SCREW LOCK T15 FT 16X3.5X2.9X (Screw) IMPLANT
SCREW LOCKING 3.5X14 (Screw) ×3 IMPLANT
SCREW LOCKING 3.5X16 (Screw) ×3 IMPLANT
SCREW METAPHYSEAL 2.7X40 (Screw) ×8 IMPLANT
SET HNDPC FAN SPRY TIP SCT (DISPOSABLE) IMPLANT
SPONGE LAP 18X18 X RAY DECT (DISPOSABLE) ×2 IMPLANT
STOCKINETTE 6  STRL (DRAPES) ×2
STOCKINETTE 6 STRL (DRAPES) ×1 IMPLANT
SUCTION FRAZIER HANDLE 10FR (MISCELLANEOUS) ×2
SUCTION TUBE FRAZIER 10FR DISP (MISCELLANEOUS) ×1 IMPLANT
SUT ETHILON 2 0 FS 18 (SUTURE) ×2 IMPLANT
SUT ETHILON 2 0 PSLX (SUTURE) ×6 IMPLANT
SUT ETHILON 3 0 FSLX (SUTURE) ×3 IMPLANT
SUT VIC AB 2-0 CTB1 (SUTURE) ×3 IMPLANT
TOWEL OR 17X24 6PK STRL BLUE (TOWEL DISPOSABLE) ×3 IMPLANT
TOWEL OR 17X26 10 PK STRL BLUE (TOWEL DISPOSABLE) ×3 IMPLANT
TUBE CONNECTING 12'X1/4 (SUCTIONS) ×1
TUBE CONNECTING 12X1/4 (SUCTIONS) ×2 IMPLANT
WATER STERILE IRR 1000ML POUR (IV SOLUTION) ×3 IMPLANT
YANKAUER SUCT BULB TIP NO VENT (SUCTIONS) IMPLANT

## 2017-03-08 NOTE — ED Notes (Signed)
Dr. Delane Ginger states we can hold new splint as pt is going to OR.

## 2017-03-08 NOTE — Anesthesia Preprocedure Evaluation (Signed)
Anesthesia Evaluation  Patient identified by MRN, date of birth, ID band Patient awake    Reviewed: Allergy & Precautions, NPO status , Patient's Chart, lab work & pertinent test results  History of Anesthesia Complications Negative for: history of anesthetic complications  Airway Mallampati: II  TM Distance: >3 FB Neck ROM: Full    Dental  (+) Teeth Intact   Pulmonary neg shortness of breath, asthma , neg sleep apnea, neg recent URI, former smoker,    breath sounds clear to auscultation       Cardiovascular (-) hypertension(-) angina+ CAD  (-) Past MI and (-) CHF (-) dysrhythmias  Rhythm:Regular     Neuro/Psych  Headaches, neg Seizures PSYCHIATRIC DISORDERS Anxiety Depression    GI/Hepatic negative GI ROS, Neg liver ROS,   Endo/Other  negative endocrine ROS  Renal/GU negative Renal ROS     Musculoskeletal  (+) Arthritis ,   Abdominal   Peds  Hematology negative hematology ROS (+)   Anesthesia Other Findings   Reproductive/Obstetrics                             Anesthesia Physical Anesthesia Plan  ASA: II and emergent  Anesthesia Plan: General and Regional   Post-op Pain Management:    Induction: Intravenous  PONV Risk Score and Plan: 3 and Ondansetron and Dexamethasone  Airway Management Planned: Oral ETT  Additional Equipment: None  Intra-op Plan:   Post-operative Plan: Extubation in OR  Informed Consent: I have reviewed the patients History and Physical, chart, labs and discussed the procedure including the risks, benefits and alternatives for the proposed anesthesia with the patient or authorized representative who has indicated his/her understanding and acceptance.   Dental advisory given  Plan Discussed with: CRNA and Surgeon  Anesthesia Plan Comments:         Anesthesia Quick Evaluation

## 2017-03-08 NOTE — Transfer of Care (Signed)
Immediate Anesthesia Transfer of Care Note  Patient: [name]  Procedure(s) Performed: Procedure(s): INTERNAL FIXATION LEFT ANKLE (Left)  Patient Location: PACU  Anesthesia Type:General  Level of Consciousness: awake, alert  and oriented  Airway & Oxygen Therapy: Patient Spontanous Breathing  Post-op Assessment: Report given to RN and Patient moving all extremities X 4  Post vital signs: Reviewed and stable  Last Vitals:  Vitals:   03/08/17 1614 03/08/17 1615  BP:    Pulse: 81 79  Resp: 10 10  Temp:    SpO2: 90% 90%    Last Pain:  Vitals:   03/08/17 1513  TempSrc:   PainSc: 5          Complications: No apparent anesthesia complications

## 2017-03-08 NOTE — ED Provider Notes (Signed)
[facility] DEPT Provider Note   CSN: [id number] Arrival date & time: 03/08/17  1230    History   Chief Complaint Chief Complaint  Patient presents with  . Fall  . Ankle Pain    HPI [name] is a 71 y.o. female.  Patient presented with severe, acute onset left ankle pain following a mechanical fall at her gym. She has a past medical history significant for anxiety, coronary artery disease, depression, IBS, and hyperlipidemia. She says she was on treadmill today at the gym with her husband preparing to walk/run, when she stepped back to adjust the settings of the machine and fell off the treadmill. She stated that she landed on her left ankle, twisted it slightly and fell striking her shoulder/head. She denied preceding symptoms such as dizziness, lightheadedness, syncopal event, seizure, loss vision, or loss of consciousness. Patient stated that she did not lose consciousness after falling or during the event. Patient denied pain in her right shoulder or head despite stating that she fell against them as well.        Past Medical History:  Diagnosis Date  . Anxiety   . Arthritis    "back, neck" (03/08/2017)  . Bowel obstruction ([place])   . CAD (coronary artery disease), native coronary artery   . Childhood asthma   . Chronic lower back pain   . Chronic neck pain   . Colon polyps   . Complication of anesthesia    "I was awake during one of my surgeries"  . Depression   . Diverticulosis   . Hyperlipidemia   . Irritable bowel syndrome   . Migraine    "used to be severe; now maybe 1/year or 2" (03/08/2017)  . Pneumonia    H!N!    Patient Active Problem List   Diagnosis Date Noted  . Closed pilon fracture, left, initial encounter 03/08/2017  . Closed fracture of left fibula and tibia   . Chest pain 10/27/2016  . Unstable angina ([place])   . CAD (coronary artery disease), native coronary artery   . Anxiety   . Irritable bowel syndrome   . Depression   .  Hyperlipidemia   . History of migraines     Past Surgical History:  Procedure Laterality Date  . APPENDECTOMY    . AUGMENTATION MAMMAPLASTY Bilateral   . BRACHIOPLASTY Bilateral    Arm Lift   . CARDIAC CATHETERIZATION    . CHOLECYSTECTOMY OPEN    . COLON SURGERY    . EYE MUSCLE SURGERY Bilateral X 2   "lazy eye; OR twice on each eye; Dr. Annamaria Boots"  . FRACTURE SURGERY    . LEFT HEART CATH AND CORONARY ANGIOGRAPHY N/A 10/27/2016   Procedure: Left Heart Cath and Coronary Angiography;  Surgeon: Peter M Martinique, MD;  Location: [facility];  Service: Cardiovascular;  Laterality: N/A;  . LEFT HEART CATHETERIZATION WITH CORONARY ANGIOGRAM N/A 08/16/2014   Procedure: LEFT HEART CATHETERIZATION WITH CORONARY ANGIOGRAM;  Surgeon: Sinclair Grooms, MD;  Location: [facility] CATH LAB;  Service: Cardiovascular;  Laterality: N/A;  . NASAL SINUS SURGERY    . ORIF ANKLE FRACTURE Left 03/08/2017  . PARTIAL COLECTOMY  1970s  . TONSILLECTOMY      OB History    No data available       Home Medications    Prior to Admission medications   Medication Sig Start Date End Date Taking? Authorizing Provider  acetaminophen (TYLENOL) 500 MG tablet Take 1,000 mg by mouth as  needed (for headaches or pain).    Yes [provider]  ALPRAZolam Duanne Moron) 0.5 MG tablet TAKE ONE TABLET BY MOUTH TWICE DAILY Patient taking differently: Take 0.5 mg by mouth two times a day 09/14/13  Yes Norins, Heinz Knuckles, MD  aspirin-acetaminophen-caffeine (EXCEDRIN MIGRAINE) [phone] MG tablet Take 2 tablets by mouth every 6 (six) hours as needed for headache or migraine.   Yes [provider]  diclofenac (VOLTAREN) 75 MG EC tablet Take 75 mg by mouth 2 (two) times daily as needed for mild pain.   Yes [provider]  FLUoxetine (PROZAC) 40 MG capsule TAKE ONE CAPSULE BY MOUTH ONCE DAILY Patient taking differently: Take 80 mg by mouth once a day 08/04/13  Yes Norins, Heinz Knuckles, MD  metoprolol succinate (TOPROL XL)  25 MG 24 hr tablet Take 0.5 tablets (12.5 mg total) by mouth daily. 11/27/16  Yes Nahser, Wonda Cheng, MD  Multiple Vitamins-Calcium (ONE-A-DAY WOMENS FORMULA) TABS Take 1 tablet by mouth daily.   Yes [provider]  oxymetazoline (AFRIN) 0.05 % nasal spray Place 1-2 sprays into both nostrils at bedtime as needed for congestion.    Yes [provider]  Tetrahydrozoline HCl (REDNESS RELIEVER EYE DROPS OP) Place 1 drop into both eyes 2 (two) times daily as needed (for redness).    Yes [provider]  traZODone (DESYREL) 50 MG tablet TAKE 1 TABLET BY MOUTH AT BEDTIME (QUALITEST BRAND ONLY [phone]) Patient taking differently: Take 50 mg by mouth at bedtime (use Qualitest brand ONLY; [place] [id number]) 07/31/13  Yes Norins, Heinz Knuckles, MD  aspirin EC 81 MG tablet Take 1 tablet (81 mg total) by mouth daily. Patient not taking: Reported on 03/08/2017 12/01/16   Nahser, Wonda Cheng, MD  omeprazole (PRILOSEC) 40 MG capsule Take 1 capsule (40 mg total) by mouth daily. 30 min before breakfast Patient not taking: Reported on 03/08/2017 11/05/16   Mauri Pole, MD    Family History Family History  Problem Relation Age of Onset  . Heart disease Mother        CHF  . Coronary artery disease Mother   . Hypertension Mother   . Hyperlipidemia Mother   . Heart disease Father        CHF  . Coronary artery disease Father   . Diabetes Sister        with all the complications  . Coronary artery disease Sister   . Stroke Sister 65       CVA  . Peripheral vascular disease Sister   . Colon cancer Maternal Grandfather   . Cancer Neg Hx        breast or colon    Social History Social History  Substance Use Topics  . Smoking status: Former Smoker    Packs/day: 1.00    Years: 41.00    Types: Cigarettes    Quit date: 01/10/2010  . Smokeless tobacco: Never Used  . Alcohol use No     Allergies   Codeine; Shellfish-derived products; Amoxicillin-pot clavulanate; Crestor  [rosuvastatin calcium]; and Lipitor [atorvastatin]   Review of Systems Review of Systems  Constitutional: Negative for chills and fever.  HENT: Negative for ear pain and sore throat.   Eyes: Negative for pain and visual disturbance.  Respiratory: Negative for cough and shortness of breath.   Cardiovascular: Negative for chest pain and palpitations.  Gastrointestinal: Negative for abdominal pain and vomiting.  Genitourinary: Negative for dysuria and hematuria.  Musculoskeletal: Positive for joint swelling. Negative for arthralgias  and back pain.  Skin: Positive for wound (To the left ankle, oozing blood as per patient.). Negative for color change and rash.  Neurological: Positive for tremors. Negative for dizziness, seizures, syncope, weakness, light-headedness and headaches.  All other systems reviewed and are negative.    Physical Exam Updated Vital Signs BP (!) 105/53 (BP Location: Left Arm)   Pulse 77   Temp 98.2 F (36.8 C) (Oral)   Resp 18   Ht 5\' 1"  (1.549 m)   Wt 62.6 kg (138 lb)   SpO2 91%   BMI 26.07 kg/m   Physical Exam  Constitutional: She is oriented to person, place, and time. She appears well-developed and well-nourished. No distress.  HENT:  Head: Normocephalic and atraumatic.  No visual of palpable head trauma  Eyes: Pupils are equal, round, and reactive to light. Conjunctivae and EOM are normal. No scleral icterus.  Neck: Neck supple. No tracheal deviation present.  Cardiovascular: Normal rate and regular rhythm.   No murmur heard. Pulmonary/Chest: Effort normal and breath sounds normal. No respiratory distress. She has no wheezes.  Abdominal: Soft. She exhibits no distension. There is no tenderness.  Musculoskeletal: She exhibits tenderness (To the left anterior distal tibia. Notable 6-8 cm tender hematoma without significant abrasion) and deformity (Below the site of the tibial hematoma there is a small edematous 15cm area of swelling on the dorsal aspect  of he foot near the junction of the leg/foot.). She exhibits no edema.  Patient able to move toes on command   Neurological: She is alert and oriented to person, place, and time.  Sensation intact distal to the site of the injury.  Skin: Skin is warm and dry. Capillary refill takes less than 2 seconds. Pulses intact distal to the injury. Both dorsalis pedis and posterior tibial. There is erythema (At the site of the lesion).  Psychiatric: She has a normal mood and affect. Her behavior is normal.  Nursing note and vitals reviewed.    ED Treatments / Results  Labs (all labs ordered are listed, but only abnormal results are displayed) Labs Reviewed  CBC - Abnormal; Notable for the following:       Result Value   WBC 12.3 (*)    All other components within normal limits  BASIC METABOLIC PANEL - Abnormal; Notable for the following:    Glucose, Bld 111 (*)    BUN 23 (*)    Creatinine, Ser 1.08 (*)    GFR calc non Af Amer 51 (*)    GFR calc Af Amer 59 (*)    All other components within normal limits    EKG  EKG Interpretation  Date/Time:  Monday March 08 2017 12:47:36 EDT Ventricular Rate:  76 PR Interval:    QRS Duration: 95 QT Interval:  439 QTC Calculation: 494 R Axis:   62 Text Interpretation:  Sinus rhythm Nonspecific ST and T wave abnormality Borderline prolonged QT interval Confirmed by Lajean Saver [phone]) on 03/08/2017 3:12:59 PM       Radiology Dg Ankle Complete Left  Result Date: 03/08/2017 CLINICAL DATA:  Left lower leg pain following a fall coming off a treadmill. EXAM: LEFT ANKLE COMPLETE - 3+ VIEW COMPARISON:  None. FINDINGS: Comminuted fracture of the distal tibia with 1/2 shaft width of medial displacement and mild lateral angulation of the distal fragment. There is also 1/3 shaft width of posterior displacement and mild posterior angulation of the distal fragment. Also demonstrated is a comminuted fracture of the distal fibula with  1/2 shaft width of medial  displacement and almost 1 shaft width of posterior displacement of the distal fragment as well as lateral and posterior angulation of the distal fragment. Associated soft tissue swelling. There is also bandage material overlying an anterior soft tissue defect inferior to the level of the fractures. There is a small amount of associated soft tissue air. IMPRESSION: 1. Comminuted fractures of the distal tibia and fibula, as described above. 2. Soft tissue air due to an anterior soft tissue defect distal to the level of the fractures. Electronically Signed   By: Claudie Revering M.D.   On: 03/08/2017 13:34    Procedures Procedures (including critical care time)  Medications Ordered in ED Medications  ALPRAZolam Duanne Moron) tablet 0.5 mg (0.5 mg Oral Given 03/08/17 2131)  FLUoxetine (PROZAC) capsule 40 mg (not administered)  traZODone (DESYREL) tablet 50 mg (50 mg Oral Given 03/08/17 2131)  metoprolol succinate (TOPROL-XL) 24 hr tablet 12.5 mg (not administered)  0.9 %  sodium chloride infusion ( Intravenous New Bag/Given 03/08/17 2036)  acetaminophen (TYLENOL) tablet 650 mg (not administered)    Or  acetaminophen (TYLENOL) suppository 650 mg (not administered)  oxyCODONE (Oxy IR/ROXICODONE) immediate release tablet 5-10 mg (not administered)  HYDROmorphone (DILAUDID) injection 1 mg (1 mg Intravenous Given 03/09/17 0302)  methocarbamol (ROBAXIN) tablet 500 mg (500 mg Oral Given 03/08/17 2348)    Or  methocarbamol (ROBAXIN) 500 mg in dextrose 5 % 50 mL IVPB ( Intravenous See Alternative 03/08/17 2348)  docusate sodium (COLACE) capsule 100 mg (100 mg Oral Given 03/08/17 2132)  polyethylene glycol (MIRALAX / GLYCOLAX) packet 17 g (not administered)  bisacodyl (DULCOLAX) suppository 10 mg (not administered)  magnesium citrate solution 1 Bottle (not administered)  ondansetron (ZOFRAN) tablet 4 mg (not administered)    Or  ondansetron (ZOFRAN) injection 4 mg (not administered)  metoCLOPramide (REGLAN) tablet 5-10  mg (not administered)    Or  metoCLOPramide (REGLAN) injection 5-10 mg (not administered)  clindamycin (CLEOCIN) IVPB 600 mg (0 mg Intravenous Stopped 03/09/17 0453)  ketorolac (TORADOL) 15 MG/ML injection 7.5 mg (7.5 mg Intravenous Given 03/09/17 0554)  aspirin EC tablet 325 mg (325 mg Oral Given 03/08/17 2033)  morphine 4 MG/ML injection 4 mg (4 mg Intravenous Given 03/08/17 1452)  ceFAZolin (ANCEF) IVPB 1 g/50 mL premix (0 g Intravenous Stopped 03/08/17 1525)  ondansetron (ZOFRAN) injection 4 mg (4 mg Intravenous Given 03/08/17 1448)  clindamycin (CLEOCIN) IVPB 900 mg (900 mg Intravenous Not Given 03/09/17 0555)  clindamycin (CLEOCIN) 900 MG/50ML IVPB (  Override pull for Anesthesia 03/08/17 1632)  white petrolatum (VASELINE) gel (1 application  Given 0/27/25 2057)     Initial Impression / Assessment and Plan / ED Course  I have reviewed the triage vital signs and the nursing notes.  Pertinent labs & imaging results that were available during my care of the patient were reviewed by me and considered in my medical decision making (see chart for details).     Given patient's presentation of severe onset left ankle pain following a mechanical fall she is most likely suffering from a fracture of the distal tibia and fibula with possible involvement of the ankle. This is consistent with the X-ray imaging that was completed shortly following the admission.  12:45 PM, patient seen and evaluated for ankle pain and a significant hematoma of the distal tibia (1 inches above the ankle as well as small 2-3 inch edematous area with central site lesion with continual bleeding and oozing. X-rays of the  ankle ordered pending results  1:36 PM, X-ray returned demonstrating comminuted fracture of the tibia and distal fibula. Dorsalis pedis and posterior tibial pulses intact as well as sensation. We will consult orthopedic surgery for evaluation. Her pain is being controlled with morphine and nausea with  zofran.  2:05 PM, Dr. Sharol Given called from Orthopedic surgery. He agreed to see the patient and to attempt to schedule for surgery today. Is content with BMP, CBC, and ECG as well as current evaluation.   2:40 PM, Ortho accepted patient with plan for surgery today.  Patient was seen and evaluated with Dr. Ashok Cordia.  Final Clinical Impressions(s) / ED Diagnoses   Final diagnoses:  Closed fracture of left tibia and fibula, initial encounter  Fall with injury, initial encounter    New Prescriptions Current Discharge Medication List       Kathi Ludwig, MD 03/09/17 [facility]    Lajean Saver, MD 03/09/17 [phone]

## 2017-03-08 NOTE — ED Triage Notes (Signed)
Pt arrives via [facility] from [employer], ems reports pt was stepping off of the treadmill when she went the wrong way and fell, injuring her L ankle. Ems reports obvious deformity to left ankle with small "pin" sized break in skin, bleeding is controlled. Denies loc, pt a/ox, 200 mcg fentanyl given en route. Pt has tremors that husband reports have been ongoing prior to injury today.

## 2017-03-08 NOTE — Anesthesia Procedure Notes (Signed)
Anesthesia Regional Block: Popliteal block   Pre-Anesthetic Checklist: ,, timeout performed, Correct Patient, Correct Site, Correct Laterality, Correct Procedure, Correct Position, site marked, Risks and benefits discussed,  Surgical consent,  Pre-op evaluation,  At surgeon's request and post-op pain management  Laterality: Lower and Left  Prep: chloraprep       Needles:  Injection technique: Single-shot  Needle Type: Echogenic Stimulator Needle          Additional Needles:   Procedures: ultrasound guided,,,,,,,,  Narrative:  Start time: 03/08/2017 4:02 PM End time: 03/08/2017 4:12 PM Injection made incrementally with aspirations every 5 mL.  Performed by: Personally  Anesthesiologist: Lilas Diefendorf  Additional Notes: H+P and labs reviewed, risks and benefits discussed with patient, procedure tolerated well without complications

## 2017-03-08 NOTE — Anesthesia Procedure Notes (Signed)
Procedure Name: Intubation Date/Time: 03/08/2017 4:31 PM Performed by: Sampson Si E Pre-anesthesia Checklist: Patient identified, Emergency Drugs available, Suction available and Patient being monitored Patient Re-evaluated:Patient Re-evaluated prior to induction Oxygen Delivery Method: Circle System Utilized Preoxygenation: Pre-oxygenation with 100% oxygen Induction Type: IV induction Ventilation: Mask ventilation without difficulty Laryngoscope Size: Mac and 3 Grade View: Grade I Tube type: Oral Tube size: 7.0 mm Number of attempts: 1 Airway Equipment and Method: Stylet and Oral airway Placement Confirmation: ETT inserted through vocal cords under direct vision,  positive ETCO2 and breath sounds checked- equal and bilateral Secured at: 20 cm Tube secured with: Tape Dental Injury: Teeth and Oropharynx as per pre-operative assessment

## 2017-03-08 NOTE — Progress Notes (Signed)
Orthopedic Tech Progress Note Patient Details:  [name] Apr 27, 1946 [id number]  Ortho Devices Type of Ortho Device: CAM walker Ortho Device/Splint Location: at bedside Ortho Device/Splint Interventions: Criss Alvine 03/08/2017, 8:51 PM

## 2017-03-08 NOTE — Anesthesia Procedure Notes (Signed)
Anesthesia Regional Block: Adductor canal block   Pre-Anesthetic Checklist: ,, timeout performed, Correct Patient, Correct Site, Correct Laterality, Correct Procedure, Correct Position, site marked, Risks and benefits discussed,  Surgical consent,  Pre-op evaluation,  At surgeon's request and post-op pain management  Laterality: Lower and Left  Prep: chloraprep       Needles:  Injection technique: Single-shot  Needle Type: Echogenic Stimulator Needle          Additional Needles:   Procedures: ultrasound guided,,,,,,,,  Narrative:  Start time: 03/08/2017 4:02 PM End time: 03/08/2017 4:12 PM Injection made incrementally with aspirations every 5 mL.  Performed by: Personally  Anesthesiologist: Donaven Criswell  Additional Notes: H+P and labs reviewed, risks and benefits discussed with patient, procedure tolerated well without complications

## 2017-03-08 NOTE — ED Notes (Signed)
Patient transported to X-ray 

## 2017-03-08 NOTE — H&P (Signed)
[name] is an 71 y.o. female.   Chief Complaint: Left distal tibia and fibular pylon fracture HPI: Patient is a 71 year old woman who was on a treadmill stepped off and fell sustaining a twisting injury to the distal tibia and fibula on the left. Patient did have skin abrasion with bleeding.  Past Medical History:  Diagnosis Date  . Anxiety   . Arthritis   . Asthma   . Bowel obstruction ([place])   . CAD (coronary artery disease), native coronary artery   . Colon polyps   . Depression   . Diverticulosis   . History of migraines   . Hyperlipidemia   . Irritable bowel syndrome   . Pneumonia     Past Surgical History:  Procedure Laterality Date  . Arm Lift Bilateral 09/23/2012  . BREAST ENHANCEMENT SURGERY    . CHOLECYSTECTOMY    . Correction of Esotropic  June 06', May '10  . LEFT HEART CATH AND CORONARY ANGIOGRAPHY N/A 10/27/2016   Procedure: Left Heart Cath and Coronary Angiography;  Surgeon: Peter M Martinique, MD;  Location: [facility];  Service: Cardiovascular;  Laterality: N/A;  . LEFT HEART CATHETERIZATION WITH CORONARY ANGIOGRAM N/A 08/16/2014   Procedure: LEFT HEART CATHETERIZATION WITH CORONARY ANGIOGRAM;  Surgeon: Sinclair Grooms, MD;  Location: [facility];  Service: Cardiovascular;  Laterality: N/A;  . PARTIAL COLECTOMY     in her 20's  . TONSILLECTOMY      Family History  Problem Relation Age of Onset  . Heart disease Mother        CHF  . Coronary artery disease Mother   . Hypertension Mother   . Hyperlipidemia Mother   . Heart disease Father        CHF  . Coronary artery disease Father   . Diabetes Sister        with all the complications  . Coronary artery disease Sister   . Stroke Sister 59       CVA  . Peripheral vascular disease Sister   . Colon cancer Maternal Grandfather   . Cancer Neg Hx        breast or colon   Social History:  reports that she quit smoking about 7 years ago. She has never used smokeless tobacco. She reports that she  does not drink alcohol or use drugs.  Allergies:  Allergies  Allergen Reactions  . Codeine Nausea And Vomiting  . Shellfish Allergy Swelling    Throat swells  . Amoxicillin-Pot Clavulanate Other (See Comments)     unknown Has patient had a PCN reaction causing immediate rash, facial/tongue/throat swelling, SOB or lightheadedness with hypotension: Unknown Has patient had a PCN reaction causing severe rash involving mucus membranes or skin necrosis: Unknown Has patient had a PCN reaction that required hospitalization Unknown Has patient had a PCN reaction occurring within the last 10 years: Unknown If all of the above answers are "NO", then may proceed with Cephalosporin use.   . Crestor [Rosuvastatin Calcium] Other (See Comments)    unknown  . Lipitor [Atorvastatin] Nausea Only and Other (See Comments)    Fatigue and Lethargy. Leg, back,Joint, and muscle pain. Per patient "My stool was a muddy clay color and I having diarrhea" pt states "I never want to take another statin again"     (Not in a hospital admission)  No results found for this or any previous visit (from the past 48 hour(s)). Dg Ankle Complete Left  Result Date: 03/08/2017  CLINICAL DATA:  Left lower leg pain following a fall coming off a treadmill. EXAM: LEFT ANKLE COMPLETE - 3+ VIEW COMPARISON:  None. FINDINGS: Comminuted fracture of the distal tibia with 1/2 shaft width of medial displacement and mild lateral angulation of the distal fragment. There is also 1/3 shaft width of posterior displacement and mild posterior angulation of the distal fragment. Also demonstrated is a comminuted fracture of the distal fibula with 1/2 shaft width of medial displacement and almost 1 shaft width of posterior displacement of the distal fragment as well as lateral and posterior angulation of the distal fragment. Associated soft tissue swelling. There is also bandage material overlying an anterior soft tissue defect inferior to the level of  the fractures. There is a small amount of associated soft tissue air. IMPRESSION: 1. Comminuted fractures of the distal tibia and fibula, as described above. 2. Soft tissue air due to an anterior soft tissue defect distal to the level of the fractures. Electronically Signed   By: Claudie Revering M.D.   On: 03/08/2017 13:34    Review of Systems  All other systems reviewed and are negative.   Blood pressure (!) 109/58, pulse 67, temperature 97.9 F (36.6 C), temperature source Oral, resp. rate 15, height 5\' 1"  (1.549 m), weight 138 lb (62.6 kg), SpO2 92 %. Physical Exam  On examination patient's left lower extremity is neurovascularly intact. Angular deformity. There is a skin abrasion which is bleeding. Radiographs shows a comminuted distal tibia and fibula  pilon fracture. Radiographs show air in the soft tissue consistent with an open fracture. Assessment/Plan Assessment: Open grade I distal tibia and fibula pilon fracture.  Plan: Plan for irrigation and debridement and possible open reduction internal fixation versus external fixation. Risks and benefits were discussed including need for additional surgery risk of nerve injury and infection. Patient states she understands which preceded this time.  Newt Minion, MD 03/08/2017, 2:51 PM

## 2017-03-08 NOTE — Op Note (Signed)
03/08/2017  5:52 PM  PATIENT:  [name]    PRE-OPERATIVE DIAGNOSIS:  Pilon Fracture Left Ankle, open grade 1  POST-OPERATIVE DIAGNOSIS:  Pilon fracture left ankle with comminution of the tibia and fibula closed.  PROCEDURE:  INTERNAL FIXATION of the distal tibia with an anterior lateral Synthes plate 8 hole, internal fixation of the fibular fracture with a 7-hole plate C-arm fluoroscopy. Incisional VAC.  SURGEON:  Newt Minion, MD  PHYSICIAN ASSISTANT:None ANESTHESIA:   General  PREOPERATIVE INDICATIONS:  [name] is a  71 y.o. female with a diagnosis of Pilon Fracture Left Ankle who failed conservative measures and elected for surgical management.    The risks benefits and alternatives were discussed with the patient preoperatively including but not limited to the risks of infection, bleeding, nerve injury, cardiopulmonary complications, the need for revision surgery, among others, and the patient was willing to proceed.  OPERATIVE IMPLANTS: Synthes one third tubular 7 hole plate, and anterior lateral 8 hole plate  OPERATIVE FINDINGS: Comminution of the tibia and fibula with each in for segmental pieces  OPERATIVE PROCEDURE: Patient was brought to the operating room after undergoing a regional block she then underwent a general anesthetic. After adequate levels of anesthesia were obtained patient's left lower extremity was first scrubbed cleansed and dried prepped using DuraPrep and draped into a sterile field a timeout was called. Anterior lateral incision was made this was carried through the skin. Blunt dissection was used to identify the superficial peroneal nerve this was protected through the case. The anterior compartment was elevated to identify the segmental fracture of the distal tibia. A plate was placed proximally and one screw was placed proximally the segmental tibia was then reduced to the plate and secured with 4 compression screws distally and an  interfragmentary screw through the segmental fragments of the metaphyseal region. C-arm fluoroscopy verified alignment the joint was congruent. The fibula also had force fragments. A plate was placed proximally and the 4 fragments were reduced to the plate and 2 locking screws were placed distally. C-arm fluoroscopy verified a congruent mortise. The wound was irrigated with pulsatile lavage prior to placing the hardware and after placing the hardware. The small puncture wound distally was near the joint but appeared to be more a puncture wound from the injury the fracture site was proximal and did not communicate with the small puncture wound. The puncture wound did not extend down to the periosteum. After further irrigation with pulsatile lavage the skin was very thin and this was closed using 2-0 nylon. A Prevena wound VAC was applied patient was extubated taken to the PACU in stable condition.

## 2017-03-08 NOTE — Progress Notes (Addendum)
Received patient from PACU with Prevna 125 wound Vac at left ankle.  Patient AOx4, VS stable, denies pain and O2Sat at 99% on RA.  Refused ice pack on operative site at left ankle. Prevna 125 wound VAC noted with intermittent beeping sound and light on for leak indicator which was previously endorsed by PACU RN. Reinforced wound vac dressing, change canister, check connector and restart the wound vac but still noted the intermittent beeping sound and leak indicator. Reinforced with tape dressing at the tube site per troubleshooting guide in the wound vac manual and solve the leak indicator light and stopped the intermittent beeping sound. Will closely monitor.

## 2017-03-09 NOTE — Care Management Obs Status (Signed)
[facility] NOTIFICATION   Patient Details  Name: [name] MRN: [id number] Date of Birth: 11/06/1945   Medicare Observation Status Notification Given:  Yes    Carles Collet, RN 03/09/2017, 9:17 AM

## 2017-03-09 NOTE — Evaluation (Signed)
Physical Therapy Evaluation Patient Details Name: [name] MRN: [id number] DOB: Nov 30, 1945 Today's Date: 03/09/2017   History of Present Illness  Patient is a 71 y/o female admitted with Pilon fracture left ankle with comminution of the tibia and fibula closed.  She is s/p INTERNAL FIXATION of the distal tibia with an anterior lateral Synthes plate 8 hole, internal fixation of the fibular fracture with a 7-hole plate.  She has PMH positive for CAD, depression, diverticulosis, bowel obstruction and partial colectomy.  Clinical Impression  Patient presents with decreased balance, decreased activity tolerance, limited weight bearing and decreased strength/sensation L LE.  She previously was independent with mobility, now requiring min A for stand pivot to chair with RW.  She will benefit from skilled PT in the acute setting for at least one more day prior to d/c home with spouse assist and HHPT.  Will need to negotiate stairs for home entry.     Follow Up Recommendations Home health PT;Supervision/Assistance - 24 hour    Equipment Recommendations  Rolling walker with 5" wheels;3in1 (PT)    Recommendations for Other Services       Precautions / Restrictions Precautions Precautions: Fall Required Braces or Orthoses: Other Brace/Splint Other Brace/Splint: cam boot at all times Restrictions LLE Weight Bearing: Non weight bearing      Mobility  Bed Mobility Overal bed mobility: Needs Assistance Bed Mobility: Supine to Sit     Supine to sit: Min assist     General bed mobility comments: for LLE  Transfers Overall transfer level: Needs assistance Equipment used: Rolling walker (2 wheeled) Transfers: Sit to/from [facility] Sit to Stand: Min assist Stand pivot transfers: Min assist       General transfer comment: cues for hand placement, assist for balance, some difficulty holding foot up completely during pivotal transfer to chair  Ambulation/Gait                 Stairs            Wheelchair Mobility    Modified Rankin (Stroke Patients Only)       Balance Overall balance assessment: Needs assistance   Sitting balance-Leahy Scale: Good Sitting balance - Comments: able to don sock in sitting   Standing balance support: Bilateral upper extremity supported Standing balance-Leahy Scale: Poor Standing balance comment: UE support and assist with NWB status L LE                             Pertinent Vitals/Pain Pain Assessment: 0-10 Pain Score: 0-No pain Pain Location: at rest no pain    Home Living Family/patient expects to be discharged to:: Private residence Living Arrangements: Spouse/significant other Available Help at Discharge: Family;Available 24 hours/day Type of Home: House Home Access: Stairs to enter   [employer] of Steps: 3 in front, 4 in back Home Layout: Two level;Able to live on main level with bedroom/bathroom Home Equipment: Shower seat - built in;Grab bars - tub/shower;Grab bars - toilet      Prior Function Level of Independence: Independent               Hand Dominance        Extremity/Trunk Assessment   Upper Extremity Assessment Upper Extremity Assessment: Overall WFL for tasks assessed    Lower Extremity Assessment Lower Extremity Assessment: LLE deficits/detail LLE Deficits / Details: in fracture boot and with nerve block still unable to move independently or feel leg  fully       Communication   Communication: No difficulties  Cognition Arousal/Alertness: Awake/alert Behavior During Therapy: WFL for tasks assessed/performed Overall Cognitive Status: Within Functional Limits for tasks assessed                                        General Comments General comments (skin integrity, edema, etc.): Patient educated to stay in chair this pm and ask for nursing assist to [facility] (had purewick on previously) and to acclimate to upright.      Exercises     Assessment/Plan    PT Assessment Patient needs continued PT services  PT Problem List Pain       PT Treatment Interventions DME instruction;Gait training;Functional mobility training;Stair training;Balance training;Therapeutic exercise;Therapeutic activities;Patient/family education    PT Goals (Current goals can be found in the Care Plan section)  Acute Rehab PT Goals Patient Stated Goal: To stay one more day then home PT Goal Formulation: With patient/family Time For Goal Achievement: 03/12/17 Potential to Achieve Goals: Good    Frequency Min 5X/week   Barriers to discharge        Co-evaluation               AM-PAC PT "6 Clicks" Daily Activity  Outcome Measure Difficulty turning over in bed (including adjusting bedclothes, sheets and blankets)?: A Little Difficulty moving from lying on back to sitting on the side of the bed? : Unable Difficulty sitting down on and standing up from a chair with arms (e.g., wheelchair, bedside commode, etc,.)?: Unable Help needed moving to and from a bed to chair (including a wheelchair)?: A Little Help needed walking in hospital room?: A Lot Help needed climbing 3-5 steps with a railing? : A Lot 6 Click Score: 12    End of Session Equipment Utilized During Treatment: Gait belt Activity Tolerance: Patient limited by fatigue Patient left: in chair;with call bell/phone within reach;with family/visitor present   PT Visit Diagnosis: Other abnormalities of gait and mobility (R26.89);History of falling (Z91.81)    Time: 0981-1914 PT Time Calculation (min) (ACUTE ONLY): 27 min   Charges:   PT Evaluation $PT Eval Moderate Complexity: 1 Mod PT Treatments $Therapeutic Activity: 8-22 mins   PT G Codes:   PT G-Codes **NOT FOR INPATIENT CLASS** Functional Assessment Tool Used: AM-PAC 6 Clicks Basic Mobility Functional Limitation: Mobility: Walking and moving around Mobility: Walking and Moving Around Current  Status (N8295): At least 60 percent but less than 80 percent impaired, limited or restricted Mobility: Walking and Moving Around Goal Status [phone]): At least 40 percent but less than 60 percent impaired, limited or restricted    [place], [place] 03/09/2017   [name] 03/09/2017, 2:03 PM

## 2017-03-09 NOTE — Progress Notes (Signed)
Patient ID: [name], female   DOB: 1945-12-19, 71 y.o.   MRN: [id number] Postoperative day 1 open reduction internal fixation pilon fracture left ankle. The wound VAC did have a leak with the Prevena canister. This will be changed out to the full size hospital canister. Patient will need to wear her fracture boot at all times physical therapy progressive ambulation nonweightbearing on the left. Patient may need discharge to skilled nursing if she is not safe with ambulation.

## 2017-03-09 NOTE — Care Management Note (Signed)
Case Management Note  Patient Details  Name: [name] MRN: [id number] Date of Birth: [birth year]  Subjective/Objective:                    Action/Plan:   Expected Discharge Date:                  Expected Discharge Plan:  [place]  In-House Referral:     Discharge planning Services  CM Consult  Post Acute Care Choice:    Choice offered to:  Patient  DME Arranged:  3-N-1, Walker rolling DME Agency:  [place]:  PT [place]:  [place]  Status of Service:  Completed, signed off  If discussed at [place] of Stay Meetings, dates discussed:    Additional Comments:  Marilu Favre, RN 03/09/2017, 3:56 PM

## 2017-03-09 NOTE — Progress Notes (Signed)
Chaplain visited and offered encouragement for this patient after recent fall which resulted in a  broken leg. She is very pleasant, and appreciative of the visit, and grateful she did not substain any other injuries.  Chaplain Yaakov Guthrie [phone]

## 2017-03-10 ENCOUNTER — Encounter (HOSPITAL_COMMUNITY): Payer: Self-pay | Admitting: Orthopedic Surgery

## 2017-03-10 DIAGNOSIS — Z7982 Long term (current) use of aspirin: Secondary | ICD-10-CM | POA: Diagnosis not present

## 2017-03-10 DIAGNOSIS — F329 Major depressive disorder, single episode, unspecified: Secondary | ICD-10-CM | POA: Diagnosis not present

## 2017-03-10 DIAGNOSIS — S82872A Displaced pilon fracture of left tibia, initial encounter for closed fracture: Secondary | ICD-10-CM | POA: Diagnosis not present

## 2017-03-10 DIAGNOSIS — E785 Hyperlipidemia, unspecified: Secondary | ICD-10-CM | POA: Diagnosis not present

## 2017-03-10 DIAGNOSIS — Z880 Allergy status to penicillin: Secondary | ICD-10-CM | POA: Diagnosis not present

## 2017-03-10 DIAGNOSIS — S82402A Unspecified fracture of shaft of left fibula, initial encounter for closed fracture: Secondary | ICD-10-CM | POA: Diagnosis not present

## 2017-03-10 DIAGNOSIS — Z885 Allergy status to narcotic agent status: Secondary | ICD-10-CM | POA: Diagnosis not present

## 2017-03-10 DIAGNOSIS — F419 Anxiety disorder, unspecified: Secondary | ICD-10-CM | POA: Diagnosis not present

## 2017-03-10 DIAGNOSIS — W1830XA Fall on same level, unspecified, initial encounter: Secondary | ICD-10-CM | POA: Diagnosis present

## 2017-03-10 DIAGNOSIS — Z888 Allergy status to other drugs, medicaments and biological substances status: Secondary | ICD-10-CM | POA: Diagnosis not present

## 2017-03-10 DIAGNOSIS — I2510 Atherosclerotic heart disease of native coronary artery without angina pectoris: Secondary | ICD-10-CM | POA: Diagnosis not present

## 2017-03-10 DIAGNOSIS — J45909 Unspecified asthma, uncomplicated: Secondary | ICD-10-CM | POA: Diagnosis not present

## 2017-03-10 DIAGNOSIS — M25572 Pain in left ankle and joints of left foot: Secondary | ICD-10-CM | POA: Diagnosis present

## 2017-03-10 DIAGNOSIS — Z79899 Other long term (current) drug therapy: Secondary | ICD-10-CM | POA: Diagnosis not present

## 2017-03-10 DIAGNOSIS — Z87891 Personal history of nicotine dependence: Secondary | ICD-10-CM | POA: Diagnosis not present

## 2017-03-10 DIAGNOSIS — Z881 Allergy status to other antibiotic agents status: Secondary | ICD-10-CM | POA: Diagnosis not present

## 2017-03-10 MED ORDER — HYDROCODONE-ACETAMINOPHEN 5-325 MG PO TABS: 1.0000 | ORAL_TABLET | ORAL | Status: DC | PRN

## 2017-03-10 MED ORDER — TRAMADOL HCL 50 MG PO TABS
100.0000 mg | ORAL_TABLET | Freq: Four times a day (QID) | ORAL | Status: DC
  Administered 2017-03-10 – 2017-03-11 (×4): 100 mg via ORAL
  Filled 2017-03-10 (×4): qty 2

## 2017-03-10 NOTE — Progress Notes (Signed)
Pt expressed that she is sensitive to oxycodone . MD paged to request alternate pain medicine

## 2017-03-10 NOTE — Progress Notes (Signed)
Physical Therapy Treatment Patient Details Name: [name] MRN: [id number] DOB: [birth year] Today's Date: 03/10/2017    History of Present Illness Patient is a 71 y/o female admitted with Pilon fracture left ankle with comminution of the tibia and fibula closed.  She is s/p INTERNAL FIXATION of the distal tibia with an anterior lateral Synthes plate 8 hole, internal fixation of the fibular fracture with a 7-hole plate.  She has PMH positive for CAD, depression, diverticulosis, bowel obstruction and partial colectomy.    PT Comments    Patient progressing with ambulation and much more confident with knee walker for ambulation.  Spouse present throughout and given info on how to get walker.  Patient with flushed cheeks and groggy from medication so encouraged IS, up in chair and continued mobility.  Will see in am for stair training with spouse assist prior to d/c.  Follow Up Recommendations  Home health PT;Supervision/Assistance - 24 hour     Equipment Recommendations  Rolling walker with 5" wheels;3in1 (PT)    Recommendations for Other Services       Precautions / Restrictions Precautions Precautions: Fall Required Braces or Orthoses: Other Brace/Splint Other Brace/Splint: cam boot at all times Restrictions LLE Weight Bearing: Non weight bearing    Mobility  Bed Mobility Overal bed mobility: Needs Assistance Bed Mobility: Supine to Sit     Supine to sit: Min guard;HOB elevated     General bed mobility comments: increased time, assist for support, steadiness  Transfers Overall transfer level: Needs assistance Equipment used: Ambulation equipment used (knee walker) Transfers: Sit to/from Stand Sit to Stand: Min assist         General transfer comment: cues for technique to transition onto knee walker, assist for placing L LE on walker  Ambulation/Gait Ambulation/Gait assistance: Min assist Ambulation Distance (Feet): 200 Feet Assistive device:  (knee  walker) Gait Pattern/deviations: Step-to pattern;Decreased stride length     General Gait Details: support for balance/safety   Stairs            Wheelchair Mobility    Modified Rankin (Stroke Patients Only)       Balance Overall balance assessment: Needs assistance   Sitting balance-Leahy Scale: Good     Standing balance support: Bilateral upper extremity supported Standing balance-Leahy Scale: Poor Standing balance comment: UE support with knee walker                            Cognition Arousal/Alertness: Awake/alert Behavior During Therapy: WFL for tasks assessed/performed Overall Cognitive Status: Within Functional Limits for tasks assessed                                        Exercises      General Comments General comments (skin integrity, edema, etc.): spouse present and assisting for stability with ambulation; discussed deferring stairs to tomorrow AM due to plans for d/c tomorrow.       Pertinent Vitals/Pain Pain Assessment: Faces Faces Pain Scale: Hurts little more Pain Location: L LE with dependency Pain Descriptors / Indicators: Discomfort Pain Intervention(s): Monitored during session;Repositioned    Home Living                      Prior Function            PT Goals (current goals can now be found  in the care plan section) Progress towards PT goals: Progressing toward goals    Frequency    Min 5X/week      PT Plan Current plan remains appropriate    Co-evaluation              AM-PAC PT "6 Clicks" Daily Activity  Outcome Measure  Difficulty turning over in bed (including adjusting bedclothes, sheets and blankets)?: A Little Difficulty moving from lying on back to sitting on the side of the bed? : Unable Difficulty sitting down on and standing up from a chair with arms (e.g., wheelchair, bedside commode, etc,.)?: Unable Help needed moving to and from a bed to chair (including a  wheelchair)?: A Little Help needed walking in hospital room?: A Lot Help needed climbing 3-5 steps with a railing? : A Lot 6 Click Score: 12    End of Session Equipment Utilized During Treatment: Gait belt;Other (comment) (camboot) Activity Tolerance: Patient tolerated treatment well Patient left: in chair;with call bell/phone within reach   PT Visit Diagnosis: Other abnormalities of gait and mobility (R26.89);History of falling (Z91.81)     Time: 3646-8032 PT Time Calculation (min) (ACUTE ONLY): 26 min  Charges:  $Gait Training: 23-37 mins                    G CodesMagda Kiel, [place] 03/10/2017    [name] 03/10/2017, 1:12 PM

## 2017-03-10 NOTE — Progress Notes (Signed)
Patient ID: [name], female   DOB: 05-17-1946, 71 y.o.   MRN: [id number] Postoperative day 2 internal fixation pilon fracture left ankle. Patient is making good progress with therapy she states she does have some chills this morning and recommended incentive spirometry. Plan for discharge to home Thursday tomorrow.

## 2017-03-10 NOTE — Anesthesia Postprocedure Evaluation (Signed)
Anesthesia Post Note  Patient: [name]  Procedure(s) Performed: Procedure(s) (LRB): INTERNAL FIXATION LEFT ANKLE (Left)     Patient location during evaluation: PACU Anesthesia Type: Regional and General Level of consciousness: awake and alert Pain management: pain level controlled Vital Signs Assessment: post-procedure vital signs reviewed and stable Respiratory status: spontaneous breathing, nonlabored ventilation, respiratory function stable and patient connected to nasal cannula oxygen Cardiovascular status: blood pressure returned to baseline and stable Postop Assessment: no signs of nausea or vomiting Anesthetic complications: no    Last Vitals:  Vitals:   03/09/17 2151 03/10/17 0557  BP: (!) 123/52 106/75  Pulse: 80 95  Resp: 16   Temp: 37.1 C 37.9 C  SpO2: 97% 93%    Last Pain:  Vitals:   03/10/17 0729  TempSrc:   PainSc: 3                  Sahra Converse

## 2017-03-11 MED ORDER — TRAMADOL HCL 50 MG PO TABS: 50.0000 mg | ORAL_TABLET | Freq: Four times a day (QID) | ORAL | 0 refills | Status: DC | PRN

## 2017-03-11 MED ORDER — HYDROCODONE-ACETAMINOPHEN 5-325 MG PO TABS: 1.0000 | ORAL_TABLET | Freq: Four times a day (QID) | ORAL | 0 refills | Status: DC | PRN

## 2017-03-11 NOTE — Progress Notes (Signed)
Orthopedic Tech Progress Note Patient Details:  [name] 08-28-1945 [id number]  Ortho Devices Type of Ortho Device: Crutches Ortho Device/Splint Location: at bedside Ortho Device/Splint Interventions: [name] 03/11/2017, 11:11 AM

## 2017-03-11 NOTE — Progress Notes (Signed)
Notified patient orders were in for discharge and she said she wanted to wait until PT worked with her at 36 and then get her meds at 11am then d/c.

## 2017-03-11 NOTE — Progress Notes (Signed)
[name] to be D/C'd  per MD order. Discussed with the patient and all questions fully answered.  VSS, Skin clean, dry and intact without evidence of skin break down, no evidence of skin tears noted.  IV catheter discontinued intact. Site without signs and symptoms of complications. Dressing and pressure applied.  An After Visit Summary was printed and given to the patient. Patient received prescription.  D/c education completed with patient/family including follow up instructions, medication list, d/c activities limitations if indicated, with other d/c instructions as indicated by MD - patient able to verbalize understanding, all questions fully answered.   Patient instructed to return to ED, call 911, or call MD for any changes in condition.   Patient to be escorted via [place], and D/C home via private auto.

## 2017-03-11 NOTE — Care Management Note (Signed)
Case Management Note  Patient Details  Name: [name] MRN: [id number] Date of Birth: Mar 14, 1946  Subjective/Objective:                    Action/Plan:   Expected Discharge Date:  03/11/17               Expected Discharge Plan:  [place]  In-House Referral:     Discharge planning Services  CM Consult  Post Acute Care Choice:    Choice offered to:  Patient  DME Arranged:  3-N-1, Walker rolling DME Agency:  [place]:  PT [facility] Agency:  [place]  Status of Service:  Completed, signed off  If discussed at [place] of Stay Meetings, dates discussed:    Additional Comments:  Marilu Favre, RN 03/11/2017, 11:37 AM

## 2017-03-11 NOTE — Discharge Summary (Signed)
Discharge Diagnoses:  Active Problems:   Closed fracture of left fibula and tibia   Closed pilon fracture, left, initial encounter   Surgeries: Procedure(s): INTERNAL FIXATION LEFT ANKLE on 03/08/2017    Consultants:   Discharged Condition: Improved  Hospital Course: [name] is an 71 y.o. female who was admitted 03/08/2017 with a chief complaint of left pilon fracture, with a final diagnosis of Pilon Fracture Left Ankle.  Patient was brought to the operating room on 03/08/2017 and underwent Procedure(s): INTERNAL FIXATION LEFT ANKLE.    Patient was given perioperative antibiotics: Anti-infectives    Start     Dose/Rate Route Frequency Ordered Stop   03/09/17 0600  clindamycin (CLEOCIN) IVPB 900 mg     900 mg 100 mL/hr over 30 Minutes Intravenous On call to O.R. 03/08/17 1546 03/08/17 1632   03/08/17 2230  clindamycin (CLEOCIN) IVPB 600 mg     600 mg 100 mL/hr over 30 Minutes Intravenous Every 6 hours 03/08/17 2002 03/09/17 1010   03/08/17 1606  clindamycin (CLEOCIN) 900 MG/50ML IVPB  Status:  Discontinued    Comments:  Ernesta Amble   : cabinet override      03/08/17 1606 03/08/17 1607   03/08/17 1548  clindamycin (CLEOCIN) 900 MG/50ML IVPB    Comments:  Henrine Screws   : cabinet override      03/08/17 1548 03/08/17 1632   03/08/17 1415  ceFAZolin (ANCEF) IVPB 1 g/50 mL premix     1 g 100 mL/hr over 30 Minutes Intravenous  Once 03/08/17 1404 03/08/17 1525    .  Patient was given sequential compression devices, early ambulation, and aspirin for DVT prophylaxis.  Recent vital signs: Patient Vitals for the past 24 hrs:  BP Temp Temp src Pulse Resp SpO2  03/11/17 0537 105/66 99.8 F (37.7 C) Oral 88 16 94 %  03/10/17 2138 101/64 100.3 F (37.9 C) Oral 88 16 94 %  03/10/17 1622 (!) 84/54 99.8 F (37.7 C) Oral 87 - 96 %  03/10/17 1455 108/68 99.5 F (37.5 C) Oral 83 17 100 %  .  Recent laboratory studies: No results found.  Discharge Medications:   Allergies as of  03/11/2017      Reactions   Codeine Nausea And Vomiting   Shellfish-derived Products Anaphylaxis, Shortness Of Breath, Swelling   Throat swells!!   Amoxicillin-pot Clavulanate    Patient doesn't recall reaction   Crestor [rosuvastatin Calcium]    Patient doesn't recall reaction   Lipitor [atorvastatin] Nausea Only, Other (See Comments)   Fatigue and Lethargy. Leg, back,Joint, and muscle pain. Per patient "My stool was a muddy clay color and I having diarrhea" pt states "I never want to take another statin again"      Medication List    TAKE these medications   acetaminophen 500 MG tablet Commonly known as:  TYLENOL Take 1,000 mg by mouth as needed (for headaches or pain).   ALPRAZolam 0.5 MG tablet Commonly known as:  XANAX TAKE ONE TABLET BY MOUTH TWICE DAILY What changed:  See the new instructions.   aspirin EC 81 MG tablet Take 1 tablet (81 mg total) by mouth daily.   aspirin-acetaminophen-caffeine 250-250-65 MG tablet Commonly known as:  EXCEDRIN MIGRAINE Take 2 tablets by mouth every 6 (six) hours as needed for headache or migraine.   diclofenac 75 MG EC tablet Commonly known as:  VOLTAREN Take 75 mg by mouth 2 (two) times daily as needed for mild pain.   FLUoxetine 40 MG  capsule Commonly known as:  PROZAC TAKE ONE CAPSULE BY MOUTH ONCE DAILY What changed:  See the new instructions.   HYDROcodone-acetaminophen 5-325 MG tablet Commonly known as:  NORCO Take 1 tablet by mouth every 6 (six) hours as needed.   metoprolol succinate 25 MG 24 hr tablet Commonly known as:  TOPROL XL Take 0.5 tablets (12.5 mg total) by mouth daily.   omeprazole 40 MG capsule Commonly known as:  PRILOSEC Take 1 capsule (40 mg total) by mouth daily. 30 min before breakfast   ONE-A-DAY WOMENS FORMULA Tabs Take 1 tablet by mouth daily.   oxymetazoline 0.05 % nasal spray Commonly known as:  AFRIN Place 1-2 sprays into both nostrils at bedtime as needed for congestion.   REDNESS  RELIEVER EYE DROPS OP Place 1 drop into both eyes 2 (two) times daily as needed (for redness).   traMADol 50 MG tablet Commonly known as:  ULTRAM Take 1 tablet (50 mg total) by mouth every 6 (six) hours as needed. Maximum dose= 8 tablets per day   traZODone 50 MG tablet Commonly known as:  DESYREL TAKE 1 TABLET BY MOUTH AT BEDTIME (QUALITEST BRAND ONLY [phone]) What changed:  See the new instructions.            Durable Medical Equipment        Start     Ordered   03/10/17 [phone]  For home use only DME Walker rolling  Once    Question:  Patient needs a walker to treat with the following condition  Answer:  Pilon fracture   03/10/17 0918   03/10/17 0917  For home use only DME 3 n 1  Once     03/10/17 0918       Discharge Care Instructions        Start     Ordered   03/11/17 0000  Call MD / Call 911    Comments:  If you experience chest pain or shortness of breath, CALL 911 and be transported to the hospital emergency room.  If you develope a fever above 101 F, pus (white drainage) or increased drainage or redness at the wound, or calf pain, call your surgeon's office.   03/11/17 0708   03/11/17 0000  Diet - low sodium heart healthy     03/11/17 0708   03/11/17 0000  Constipation Prevention    Comments:  Drink plenty of fluids.  Prune juice may be helpful.  You may use a stool softener, such as Colace (over the counter) 100 mg twice a day.  Use MiraLax (over the counter) for constipation as needed.   03/11/17 0708   03/11/17 0000  Increase activity slowly as tolerated     03/11/17 0708   03/11/17 0000  Non weight bearing    Question Answer Comment  Laterality left   Extremity Lower      03/11/17 0708   03/11/17 0000  Elevate operative extremity     03/11/17 0708   03/11/17 0000  traMADol (ULTRAM) 50 MG tablet  Every 6 hours PRN     03/11/17 0708   03/11/17 0000  HYDROcodone-acetaminophen (NORCO) 5-325 MG tablet  Every 6 hours PRN     03/11/17 0708       Diagnostic Studies: Dg Ankle Complete Left  Result Date: 03/08/2017 CLINICAL DATA:  Left lower leg pain following a fall coming off a treadmill. EXAM: LEFT ANKLE COMPLETE - 3+ VIEW COMPARISON:  None. FINDINGS: Comminuted fracture of the distal tibia with  1/2 shaft width of medial displacement and mild lateral angulation of the distal fragment. There is also 1/3 shaft width of posterior displacement and mild posterior angulation of the distal fragment. Also demonstrated is a comminuted fracture of the distal fibula with 1/2 shaft width of medial displacement and almost 1 shaft width of posterior displacement of the distal fragment as well as lateral and posterior angulation of the distal fragment. Associated soft tissue swelling. There is also bandage material overlying an anterior soft tissue defect inferior to the level of the fractures. There is a small amount of associated soft tissue air. IMPRESSION: 1. Comminuted fractures of the distal tibia and fibula, as described above. 2. Soft tissue air due to an anterior soft tissue defect distal to the level of the fractures. Electronically Signed   By: Claudie Revering M.D.   On: 03/08/2017 13:34    Patient benefited maximally from their hospital stay and there were no complications.     Disposition: 01-Home or Self Care Discharge Instructions    Call MD / Call 911    Complete by:  As directed    If you experience chest pain or shortness of breath, CALL 911 and be transported to the hospital emergency room.  If you develope a fever above 101 F, pus (white drainage) or increased drainage or redness at the wound, or calf pain, call your surgeon's office.   Constipation Prevention    Complete by:  As directed    Drink plenty of fluids.  Prune juice may be helpful.  You may use a stool softener, such as Colace (over the counter) 100 mg twice a day.  Use MiraLax (over the counter) for constipation as needed.   Diet - low sodium heart healthy    Complete  by:  As directed    Elevate operative extremity    Complete by:  As directed    Increase activity slowly as tolerated    Complete by:  As directed    Non weight bearing    Complete by:  As directed    Laterality:  left   Extremity:  Lower     Follow-up Information    Newt Minion, MD Follow up in 1 week(s).   Specialty:  Orthopedic Surgery Contact information: [address] [place] [address] [phone]            Signed: Newt Minion 03/11/2017, 7:08 AM

## 2017-03-11 NOTE — Progress Notes (Signed)
Physical Therapy Treatment Patient Details Name: [name] MRN: [id number] DOB: [birth year] Today's Date: 03/11/2017    History of Present Illness Patient is a 71 y/o female admitted with Pilon fracture left ankle with comminution of the tibia and fibula closed.  She is s/p INTERNAL FIXATION of the distal tibia with an anterior lateral Synthes plate 8 hole, internal fixation of the fibular fracture with a 7-hole plate.  She has PMH positive for CAD, depression, diverticulosis, bowel obstruction and partial colectomy.    PT Comments    Patient able to negotiate stairs with spouse assist.  Also will have son to assist initially.  Did demonstrate also with rail and crutch and called for crutches for pt prior to d/c.  Educated to have [place] instruct pt in this technique as would be able to perform with one person assist.     Follow Up Recommendations  Home health PT;Supervision/Assistance - 24 hour     Equipment Recommendations  Rolling walker with 5" wheels;3in1 (PT);Crutches    Recommendations for Other Services       Precautions / Restrictions Precautions Precautions: Fall Required Braces or Orthoses: Other Brace/Splint Other Brace/Splint: cam boot at all times Restrictions LLE Weight Bearing: Non weight bearing    Mobility  Bed Mobility Overal bed mobility: Needs Assistance Bed Mobility: Sit to Supine       Sit to supine: Min assist   General bed mobility comments: assist for leg into bed after ambulation, initially pt up on [facility] with spouse assist  Transfers Overall transfer level: Needs assistance Equipment used:  (knee walker) Transfers: Sit to/from Stand Sit to Stand: Min assist         General transfer comment: for balance and to place L LE on walker  Ambulation/Gait Ambulation/Gait assistance: Min guard Ambulation Distance (Feet): 130 Feet Assistive device:  (knee walker) Gait Pattern/deviations: Step-to pattern;Decreased stride length     General  Gait Details: support for balance/safety   Stairs Stairs: Yes   Stair Management: Backwards;With walker Number of Stairs: 2 General stair comments: performed with reverse technique with spouse assist for walker and PT assist for balance (spouse states son will also assist for in the house); reports they also have rail and showed spouse technique with crutch and rail and gave handouts on both techniques  Wheelchair Mobility    Modified Rankin (Stroke Patients Only)       Balance Overall balance assessment: Needs assistance   Sitting balance-Leahy Scale: Good     Standing balance support: Bilateral upper extremity supported Standing balance-Leahy Scale: Poor Standing balance comment: UE support with knee walker                            Cognition Arousal/Alertness: Awake/alert Behavior During Therapy: WFL for tasks assessed/performed Overall Cognitive Status: Within Functional Limits for tasks assessed                                        Exercises      General Comments General comments (skin integrity, edema, etc.): educated in heel press for hamstring strengthening, and standing knee flexion       Pertinent Vitals/Pain Faces Pain Scale: Hurts even more Pain Location: L calf cramp Pain Descriptors / Indicators: Cramping Pain Intervention(s): Monitored during session;Repositioned    Home Living  Prior Function            PT Goals (current goals can now be found in the care plan section) Progress towards PT goals: Progressing toward goals    Frequency    Min 5X/week      PT Plan Current plan remains appropriate    Co-evaluation              AM-PAC PT "6 Clicks" Daily Activity  Outcome Measure  Difficulty turning over in bed (including adjusting bedclothes, sheets and blankets)?: A Little Difficulty moving from lying on back to sitting on the side of the bed? : Unable Difficulty  sitting down on and standing up from a chair with arms (e.g., wheelchair, bedside commode, etc,.)?: Unable Help needed moving to and from a bed to chair (including a wheelchair)?: A Little Help needed walking in hospital room?: A Little Help needed climbing 3-5 steps with a railing? : A Lot 6 Click Score: 13    End of Session Equipment Utilized During Treatment: Gait belt;Other (comment) (camboot) Activity Tolerance: Patient tolerated treatment well Patient left: in bed;with call bell/phone within reach;with family/visitor present   PT Visit Diagnosis: Other abnormalities of gait and mobility (R26.89);History of falling (Z91.81)     Time: 4098-1191 PT Time Calculation (min) (ACUTE ONLY): 35 min  Charges:  $Gait Training: 8-22 mins $Self Care/Home Management: 8-22                    G CodesMagda Kiel, [place] 03/11/2017    [name] 03/11/2017, 10:45 AM

## 2017-03-17 ENCOUNTER — Ambulatory Visit (INDEPENDENT_AMBULATORY_CARE_PROVIDER_SITE_OTHER): Payer: Medicare Other | Admitting: Family

## 2017-03-17 DIAGNOSIS — S82202D Unspecified fracture of shaft of left tibia, subsequent encounter for closed fracture with routine healing: Secondary | ICD-10-CM

## 2017-03-17 DIAGNOSIS — S82402D Unspecified fracture of shaft of left fibula, subsequent encounter for closed fracture with routine healing: Secondary | ICD-10-CM

## 2017-03-17 DIAGNOSIS — S82872A Displaced pilon fracture of left tibia, initial encounter for closed fracture: Secondary | ICD-10-CM

## 2017-03-17 DIAGNOSIS — J342 Deviated nasal septum: Secondary | ICD-10-CM

## 2017-03-17 NOTE — Progress Notes (Signed)
Post-Op Visit Note   Patient: [name]           Date of Birth: 04/08/1946           MRN: [id number] Visit Date: 03/17/2017 PCP: Leighton Ruff, MD  Chief Complaint:  Chief Complaint  Patient presents with  . Left Ankle - Routine Post Op    HPI:  HPI The patient is a 71 year old woman who presents for one week status post ORIF for left ankle fracture. She's been nonweightbearing she did have a wound VAC which was removed today.  Ortho Exam An incision well proximally sutures there is minimal bloody drainage no erythema moderate swelling to the foot and ankle. No sign of infection  Visit Diagnoses:  1. Closed fracture of left tibia and fibula with routine healing, subsequent encounter   2. Closed pilon fracture, left, initial encounter   3. Nasal septal deviation     Plan: Begin daily Dial soap cleansing. Apply dry dressing. Continue nonweightbearing in the fracture boot. Follow-up in 1 week to harvest sutures at that time  Follow-Up Instructions: Return in about 1 week (around 03/24/2017).   Imaging: No results found.  Orders:  Orders Placed This Encounter  Procedures  . Ambulatory referral to Plastic Surgery   No orders of the defined types were placed in this encounter.    PMFS History: Patient Active Problem List   Diagnosis Date Noted  . Closed pilon fracture, left, initial encounter 03/08/2017  . Closed fracture of left fibula and tibia   . Chest pain 10/27/2016  . Unstable angina ([place])   . CAD (coronary artery disease), native coronary artery   . Anxiety   . Irritable bowel syndrome   . Depression   . Hyperlipidemia   . History of migraines    Past Medical History:  Diagnosis Date  . Anxiety   . Arthritis    "back, neck" (03/08/2017)  . Bowel obstruction ([place])   . CAD (coronary artery disease), native coronary artery   . Childhood asthma   . Chronic lower back pain   . Chronic neck pain   . Colon polyps   . Complication of anesthesia     "I was awake during one of my surgeries"  . Depression   . Diverticulosis   . Hyperlipidemia   . Irritable bowel syndrome   . Migraine    "used to be severe; now maybe 1/year or 2" (03/08/2017)  . Pneumonia    H!N!    Family History  Problem Relation Age of Onset  . Heart disease Mother        CHF  . Coronary artery disease Mother   . Hypertension Mother   . Hyperlipidemia Mother   . Heart disease Father        CHF  . Coronary artery disease Father   . Diabetes Sister        with all the complications  . Coronary artery disease Sister   . Stroke Sister 30       CVA  . Peripheral vascular disease Sister   . Colon cancer Maternal Grandfather   . Cancer Neg Hx        breast or colon    Past Surgical History:  Procedure Laterality Date  . APPENDECTOMY    . AUGMENTATION MAMMAPLASTY Bilateral   . BRACHIOPLASTY Bilateral    Arm Lift   . CARDIAC CATHETERIZATION    . CHOLECYSTECTOMY OPEN    . COLON SURGERY    .  EXTERNAL FIXATION LEG Left 03/08/2017   Procedure: INTERNAL FIXATION LEFT ANKLE;  Surgeon: Newt Minion, MD;  Location: [facility];  Service: Orthopedics;  Laterality: Left;  . EYE MUSCLE SURGERY Bilateral X 2   "lazy eye; OR twice on each eye; Dr. Annamaria Boots"  . FRACTURE SURGERY    . LEFT HEART CATH AND CORONARY ANGIOGRAPHY N/A 10/27/2016   Procedure: Left Heart Cath and Coronary Angiography;  Surgeon: Peter M Martinique, MD;  Location: [facility];  Service: Cardiovascular;  Laterality: N/A;  . LEFT HEART CATHETERIZATION WITH CORONARY ANGIOGRAM N/A 08/16/2014   Procedure: LEFT HEART CATHETERIZATION WITH CORONARY ANGIOGRAM;  Surgeon: Sinclair Grooms, MD;  Location: [facility] CATH LAB;  Service: Cardiovascular;  Laterality: N/A;  . NASAL SINUS SURGERY    . ORIF ANKLE FRACTURE Left 03/08/2017  . PARTIAL COLECTOMY  1970s  . TONSILLECTOMY     Social History   Occupational History  . 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 Smoking status: Former Smoker     Packs/day: 1.00    Years: 41.00    Types: Cigarettes    Quit date: 01/10/2010  . Smokeless tobacco: Never Used  . Alcohol use No  . Drug use: No  . Sexual activity: Not Currently    Partners: Female

## 2017-03-19 ENCOUNTER — Telehealth (INDEPENDENT_AMBULATORY_CARE_PROVIDER_SITE_OTHER): Payer: Self-pay | Admitting: Orthopedic Surgery

## 2017-03-19 NOTE — Telephone Encounter (Signed)
Advised patients husband we do not have any cam walkers without the air pump device attached. Her old boot was thrown away. Advised to try to putting a pillow underneath her knee. Advised that if not we would write rx for [employer] for her to get another cam walker there, but most likely be charged, since this is her 3rd fracture boot. Estimate price of minimum 300 dollars.

## 2017-03-19 NOTE — Telephone Encounter (Signed)
Patient's husband Donna Christen) called advised patient is having a problem using the boot with the scooter. He asked if they can switch out the boot for another one without the inflation knob. The number to contact Donna Christen is [phone]

## 2017-03-22 ENCOUNTER — Telehealth (INDEPENDENT_AMBULATORY_CARE_PROVIDER_SITE_OTHER): Payer: Self-pay | Admitting: Family

## 2017-03-22 NOTE — Telephone Encounter (Signed)
Patients husband calling on behalf of his wife, he said she broke her nose and was wondering if you had any suggestions on where she should go to get it looked at. CB # [phone]

## 2017-03-22 NOTE — Telephone Encounter (Signed)
Advised patient's husband that Dr. Stephanie Coup does not do any type of rhinoplasty surgery. Dr. Sharol Given recommended Dr. Marla Roe, they used to work together and is very familiar with her. They would like to proceed, they may also persue with rhinoplasty surgeon at [facility], but would like to meet with Dr. Marla Roe first because she is local.

## 2017-03-23 NOTE — Telephone Encounter (Signed)
Referral faxed to Dr. Elenor Quinones office by Fleeta Emmer this morning, will call their office later this week to check on status of referral.

## 2017-03-24 ENCOUNTER — Ambulatory Visit (INDEPENDENT_AMBULATORY_CARE_PROVIDER_SITE_OTHER): Payer: Medicare Other

## 2017-03-24 ENCOUNTER — Ambulatory Visit (INDEPENDENT_AMBULATORY_CARE_PROVIDER_SITE_OTHER): Payer: Medicare Other | Admitting: Family

## 2017-03-24 DIAGNOSIS — S82402D Unspecified fracture of shaft of left fibula, subsequent encounter for closed fracture with routine healing: Secondary | ICD-10-CM

## 2017-03-24 DIAGNOSIS — S82202D Unspecified fracture of shaft of left tibia, subsequent encounter for closed fracture with routine healing: Secondary | ICD-10-CM

## 2017-03-24 NOTE — Progress Notes (Signed)
Post-Op Visit Note   Patient: [name]           Date of Birth: Nov 26, 1945           MRN: [id number] Visit Date: 03/24/2017 PCP: Leighton Ruff, MD  Chief Complaint:  No chief complaint on file.   HPI:  HPI The patient is a 71 year old woman who presents two week status post ORIF for left ankle fracture. She's been nonweightbearing.  Ortho Exam   incision healing well. sutures in place. No drainage. no erythema moderate swelling to the foot and ankle. No sign of infection  Visit Diagnoses:  1. Closed fracture of left tibia and fibula with routine healing, subsequent encounter     Plan: Begin daily Dial soap cleansing. Apply dry dressing. Continue nonweightbearing in the fracture boot. Follow-up in 2 weeks with repeat radiographs.  Follow-Up Instructions: Return in about 2 weeks (around 04/07/2017).   Imaging: No results found.  Orders:  Orders Placed This Encounter  Procedures  . XR Ankle Complete Left   No orders of the defined types were placed in this encounter.    PMFS History: Patient Active Problem List   Diagnosis Date Noted  . Closed pilon fracture, left, initial encounter 03/08/2017  . Closed fracture of left fibula and tibia   . Chest pain 10/27/2016  . Unstable angina ([place])   . CAD (coronary artery disease), native coronary artery   . Anxiety   . Irritable bowel syndrome   . Depression   . Hyperlipidemia   . History of migraines    Past Medical History:  Diagnosis Date  . Anxiety   . Arthritis    "back, neck" (03/08/2017)  . Bowel obstruction ([place])   . CAD (coronary artery disease), native coronary artery   . Childhood asthma   . Chronic lower back pain   . Chronic neck pain   . Colon polyps   . Complication of anesthesia    "I was awake during one of my surgeries"  . Depression   . Diverticulosis   . Hyperlipidemia   . Irritable bowel syndrome   . Migraine    "used to be severe; now maybe 1/year or 2" (03/08/2017)  .  Pneumonia    H!N!    Family History  Problem Relation Age of Onset  . Heart disease Mother        CHF  . Coronary artery disease Mother   . Hypertension Mother   . Hyperlipidemia Mother   . Heart disease Father        CHF  . Coronary artery disease Father   . Diabetes Sister        with all the complications  . Coronary artery disease Sister   . Stroke Sister 71       CVA  . Peripheral vascular disease Sister   . Colon cancer Maternal Grandfather   . Cancer Neg Hx        breast or colon    Past Surgical History:  Procedure Laterality Date  . APPENDECTOMY    . AUGMENTATION MAMMAPLASTY Bilateral   . BRACHIOPLASTY Bilateral    Arm Lift   . CARDIAC CATHETERIZATION    . CHOLECYSTECTOMY OPEN    . COLON SURGERY    . EXTERNAL FIXATION LEG Left 03/08/2017   Procedure: INTERNAL FIXATION LEFT ANKLE;  Surgeon: Newt Minion, MD;  Location: [facility];  Service: Orthopedics;  Laterality: Left;  . EYE MUSCLE SURGERY Bilateral X 2   "lazy  eye; OR twice on each eye; Dr. Annamaria Boots"  . FRACTURE SURGERY    . LEFT HEART CATH AND CORONARY ANGIOGRAPHY N/A 10/27/2016   Procedure: Left Heart Cath and Coronary Angiography;  Surgeon: Peter M Martinique, MD;  Location: [facility];  Service: Cardiovascular;  Laterality: N/A;  . LEFT HEART CATHETERIZATION WITH CORONARY ANGIOGRAM N/A 08/16/2014   Procedure: LEFT HEART CATHETERIZATION WITH CORONARY ANGIOGRAM;  Surgeon: Sinclair Grooms, MD;  Location: [facility] CATH LAB;  Service: Cardiovascular;  Laterality: N/A;  . NASAL SINUS SURGERY    . ORIF ANKLE FRACTURE Left 03/08/2017  . PARTIAL COLECTOMY  1970s  . TONSILLECTOMY     Social History   Occupational History  . 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 Smoking status: Former Smoker    Packs/day: 1.00    Years: 41.00    Types: Cigarettes    Quit date: 01/10/2010  . Smokeless tobacco: Never Used  . Alcohol use No  . Drug use: No  . Sexual activity: Not Currently    Partners: Female

## 2017-03-24 NOTE — Telephone Encounter (Signed)
Patient scheduled 04/13/17 10am at Dr. Eusebio Friendly office. Patient aware of appointment date and time.

## 2017-03-30 ENCOUNTER — Ambulatory Visit: Payer: Medicare Other | Admitting: Neurology

## 2017-03-31 ENCOUNTER — Telehealth (INDEPENDENT_AMBULATORY_CARE_PROVIDER_SITE_OTHER): Payer: Self-pay

## 2017-03-31 ENCOUNTER — Telehealth (INDEPENDENT_AMBULATORY_CARE_PROVIDER_SITE_OTHER): Payer: Self-pay | Admitting: Orthopedic Surgery

## 2017-03-31 NOTE — Telephone Encounter (Signed)
Patient called asking for a temporary handicap placard. She currently cant bear any weight on one leg and is in some pain. CB #  [phone]

## 2017-03-31 NOTE — Telephone Encounter (Signed)
Apologized to patient advised unfortunately we are seeing patients and I have left up at front desk to pick up.

## 2017-03-31 NOTE — Telephone Encounter (Signed)
Patient called back concerning handicap placard.  CB# [phone].  Please advise.

## 2017-04-07 ENCOUNTER — Ambulatory Visit (INDEPENDENT_AMBULATORY_CARE_PROVIDER_SITE_OTHER): Payer: Medicare Other | Admitting: Orthopedic Surgery

## 2017-04-07 ENCOUNTER — Encounter (INDEPENDENT_AMBULATORY_CARE_PROVIDER_SITE_OTHER): Payer: Self-pay | Admitting: Family

## 2017-04-07 ENCOUNTER — Ambulatory Visit (INDEPENDENT_AMBULATORY_CARE_PROVIDER_SITE_OTHER): Payer: Medicare Other | Admitting: Orthopaedic Surgery

## 2017-04-07 ENCOUNTER — Ambulatory Visit (INDEPENDENT_AMBULATORY_CARE_PROVIDER_SITE_OTHER): Payer: Medicare Other

## 2017-04-07 DIAGNOSIS — M25572 Pain in left ankle and joints of left foot: Secondary | ICD-10-CM

## 2017-04-07 DIAGNOSIS — S82872A Displaced pilon fracture of left tibia, initial encounter for closed fracture: Secondary | ICD-10-CM

## 2017-04-07 NOTE — Progress Notes (Signed)
The patient is a 71 year old who is 1 month out from open reduction-internal fixation of a left pylon fracture and distal fibular fracture. This was done in August by Dr. Sharol Given. She's been doing well overall. Her sutures are still in place.  The sutures are removed and the incision looks good. Her foot is well perfused. She's got actually pretty good range of motion of her left ankle. Her sensation is intact. She does have forefoot pain but I told her this is to be expected given the surgery. Her calf is soft. She's been wearing a cam walking boot. She does wear when she sleeps as well. She does have a rolling knee scooter as well.  X-rays of the distal tibia and fibula on the left side show the hardware is intact and the alignment is anatomic. Her ankle joint looks good. This was next articular fracture. There is been slight interval healing.  She can only still just touchdown weightbearing for now I explained the reasoning behind this. X-rays on the left ankle need to be performed again in 4 weeks to see if weightbearing can be advanced. All questions concern for answered and addressed.

## 2017-04-13 DIAGNOSIS — S022XXD Fracture of nasal bones, subsequent encounter for fracture with routine healing: Secondary | ICD-10-CM | POA: Insufficient documentation

## 2017-04-15 ENCOUNTER — Ambulatory Visit (INDEPENDENT_AMBULATORY_CARE_PROVIDER_SITE_OTHER): Payer: Medicare Other | Admitting: Orthopedic Surgery

## 2017-04-16 ENCOUNTER — Ambulatory Visit: Payer: Self-pay | Admitting: Plastic Surgery

## 2017-04-16 ENCOUNTER — Encounter (HOSPITAL_BASED_OUTPATIENT_CLINIC_OR_DEPARTMENT_OTHER): Payer: Self-pay | Admitting: *Deleted

## 2017-04-16 DIAGNOSIS — S022XXA Fracture of nasal bones, initial encounter for closed fracture: Secondary | ICD-10-CM

## 2017-04-16 NOTE — H&P (Signed)
[name] is an 71 y.o. female.   Chief Complaint: nasal septal fracture HPI: The patient is a 71 y.o. yrs old wf here with her husband for evaluation of her nose.  She fell and broke her tibia and fibula several weeks ago 8/27.  She was at the gym and does not remember exactly what happened.  She knows she was on the tread mill and woke up on the ground.  [name] since to the left with the septum included.  She had nasal surgery at the age of 48 yrs at [facility].  She is otherwise in good health.  She is able to breath through her nose.  Past Medical History:  Diagnosis Date  . Anxiety   . Arthritis    "back, neck" (03/08/2017)  . Bowel obstruction ([place])   . CAD (coronary artery disease), native coronary artery   . Childhood asthma   . Chronic lower back pain   . Chronic neck pain   . Colon polyps   . Complication of anesthesia    "I was awake during one of my surgeries"  . Depression   . Diverticulosis   . Hyperlipidemia   . Irritable bowel syndrome   . Migraine    "used to be severe; now maybe 1/year or 2" (03/08/2017)  . Pneumonia    H!N!    Past Surgical History:  Procedure Laterality Date  . APPENDECTOMY    . AUGMENTATION MAMMAPLASTY Bilateral   . BRACHIOPLASTY Bilateral    Arm Lift   . CARDIAC CATHETERIZATION    . CHOLECYSTECTOMY OPEN    . COLON SURGERY    . EXTERNAL FIXATION LEG Left 03/08/2017   Procedure: INTERNAL FIXATION LEFT ANKLE;  Surgeon: Tanya Minion, MD;  Location: [place];  Service: Orthopedics;  Laterality: Left;  . EYE MUSCLE SURGERY Bilateral X 2   "lazy eye; OR twice on each eye; Dr. Annamaria Mcpherson"  . FRACTURE SURGERY    . LEFT HEART CATH AND CORONARY ANGIOGRAPHY N/A 10/27/2016   Procedure: Left Heart Cath and Coronary Angiography;  Surgeon: Tanya M Martinique, MD;  Location: [place] [facility];  Service: Cardiovascular;  Laterality: N/A;  . LEFT HEART CATHETERIZATION WITH CORONARY ANGIOGRAM N/A 08/16/2014   Procedure: LEFT HEART CATHETERIZATION  WITH CORONARY ANGIOGRAM;  Surgeon: Tanya Grooms, MD;  Location: [facility] C[facility];  Service: Cardiovascular;  Laterality: N/A;  . NASAL SINUS SURGERY    . ORIF ANKLE FRACTURE Left 03/08/2017  . PARTIAL COLECTOMY  1970s  . TONSILLECTOMY      Family History  Problem Relation Age of Onset  . Heart disease Mother        CHF  . Coronary artery disease Mother   . Hypertension Mother   . Hyperlipidemia Mother   . Heart disease Father        CHF  . Coronary artery disease Father   . Diabetes Sister        with all the complications  . Coronary artery disease Sister   . Stroke Sister 80       CVA  . Peripheral vascular disease Sister   . Colon cancer Maternal Grandfather   . Cancer Neg Hx        breast or colon   Social History:  reports that she quit smoking about 7 years ago. Her smoking use included Cigarettes. She has a 41.00 pack-year smoking history. She has never used smokeless tobacco. She reports that she does not  drink alcohol or use drugs.  Allergies:  Allergies  Allergen Reactions  . Codeine Nausea And Vomiting  . Shellfish-Derived Products Anaphylaxis, Shortness Of Breath and Swelling    Throat swells!!  . Amoxicillin-Pot Clavulanate     Patient doesn't recall reaction  . Crestor [Rosuvastatin Calcium]     Patient doesn't recall reaction  . Lipitor [Atorvastatin] Nausea Only and Other (See Comments)    Fatigue and Lethargy. Leg, back,Joint, and muscle pain. Per patient "My stool was a muddy clay color and I having diarrhea" pt states "I never want to take another statin again"     (Not in a hospital admission)  No results found for this or any previous visit (from the past 48 hour(s)). No results found.  Review of Systems  Constitutional: Negative.   HENT: Negative.   Eyes: Negative.   Respiratory: Negative.   Cardiovascular: Negative.   Gastrointestinal: Negative.   Genitourinary: Negative.   Musculoskeletal: Negative.   Skin: Negative.   Neurological:  Negative.   Psychiatric/Behavioral: Negative.     There were no vitals taken for this visit. Physical Exam  Constitutional: She is oriented to person, place, and time. She appears well-developed and well-nourished.  HENT:  Head: Normocephalic.  Eyes: Pupils are equal, round, and reactive to light. EOM are normal.  Cardiovascular: Normal rate.   Respiratory: Effort normal.  GI: Soft.  Neurological: She is alert and oriented to person, place, and time.  Skin: Skin is warm. No rash noted. No erythema. No pallor.  Psychiatric: She has a normal mood and affect. Her behavior is normal. Judgment and thought content normal.     Assessment/Plan Closed nasal septal reduction  Tanya Going, DO 04/16/2017, 1:58 PM

## 2017-04-21 ENCOUNTER — Ambulatory Visit (HOSPITAL_BASED_OUTPATIENT_CLINIC_OR_DEPARTMENT_OTHER): Admission: RE | Admit: 2017-04-21 | Payer: Medicare Other | Source: Ambulatory Visit | Admitting: Plastic Surgery

## 2017-04-21 SURGERY — CLOSED REDUCTION, FRACTURE, NASAL BONE
Anesthesia: General

## 2017-04-29 ENCOUNTER — Ambulatory Visit (INDEPENDENT_AMBULATORY_CARE_PROVIDER_SITE_OTHER): Payer: Medicare Other

## 2017-04-29 ENCOUNTER — Ambulatory Visit (INDEPENDENT_AMBULATORY_CARE_PROVIDER_SITE_OTHER): Payer: Medicare Other | Admitting: Orthopedic Surgery

## 2017-04-29 DIAGNOSIS — S82872D Displaced pilon fracture of left tibia, subsequent encounter for closed fracture with routine healing: Secondary | ICD-10-CM

## 2017-04-29 NOTE — Progress Notes (Signed)
Office Visit Note   Patient: [name]           Date of Birth: 1946-02-19           MRN: [id number] Visit Date: 04/29/2017              Requested by: Leighton Ruff, MD [place], [place] [address] PCP: Leighton Ruff, MD  No chief complaint on file.     HPI: Patient presents in follow-up 7/2 weeks status post internal fixation pilon fracture tibia and fibula left ankle. She has nonweightbearing in a fracture boot she has no complaints.  Assessment & Plan: Visit Diagnoses:  1. Closed displaced pilon fracture of left tibia with routine healing, subsequent encounter     Plan: we will plan to start weightbearing as tolerated in the fracture boot she is given instructions to work on range of motion of the ankle specifically dorsiflexion. Discussed that if she cannot achieve dorsiflexion of only therapy. Repeat 3 view radiographs of the left ankle at follow-up. May advance to full weightbearing at follow-up.recommended 15-20 mm of compression knee-high stockings  Follow-Up Instructions: Return in about 2 weeks (around 05/13/2017).   Ortho Exam  Patient is alert, oriented, no adenopathy, well-dressed, normal affect, normal respiratory effort. Examination patient has no skin breakdown she does have swelling. Her foot is neurovascularly intact no signs of infection.  Imaging: Xr Ankle Complete Left  Result Date: 04/29/2017 Three-view radiographs of the left ankle shows good callus formation at the segmental pilon fracture left ankle with the fibula out to length and good callus formation at the fibula was well there is no complicating features of the hardware.  No images are attached to the encounter.  Labs: Lab Results  Component Value Date   HGBA1C 5.8 04/24/2010   HGBA1C 5.7 08/08/2009   ESRSEDRATE 14 08/23/2012   REPTSTATUS 05/07/2008 FINAL 05/01/2008   CULT NO GROWTH 5 DAYS 05/01/2008    Orders:  Orders Placed This Encounter  Procedures    . XR Ankle Complete Left   No orders of the defined types were placed in this encounter.    Procedures: No procedures performed  Clinical Data: No additional findings.  ROS:  All other systems negative, except as noted in the HPI. Review of Systems  Objective: Vital Signs: There were no vitals taken for this visit.  Specialty Comments:  No specialty comments available.  PMFS History: Patient Active Problem List   Diagnosis Date Noted  . Closed pilon fracture, left, initial encounter 03/08/2017  . Closed fracture of left fibula and tibia   . Chest pain 10/27/2016  . Unstable angina ([place])   . CAD (coronary artery disease), native coronary artery   . Anxiety   . Irritable bowel syndrome   . Depression   . Hyperlipidemia   . History of migraines    Past Medical History:  Diagnosis Date  . Anxiety   . Arthritis    "back, neck" (03/08/2017)  . Bowel obstruction ([place])   . CAD (coronary artery disease), native coronary artery   . Chronic lower back pain   . Chronic neck pain   . Colon polyps   . Complication of anesthesia    "I was awake during one of my surgeries"  . Depression   . Diverticulosis   . Hyperlipidemia   . Irritable bowel syndrome   . Migraine    "used to be severe; now maybe 1/year or 2" (03/08/2017)  . Pneumonia  H!N!    Family History  Problem Relation Age of Onset  . Heart disease Mother        CHF  . Coronary artery disease Mother   . Hypertension Mother   . Hyperlipidemia Mother   . Heart disease Father        CHF  . Coronary artery disease Father   . Diabetes Sister        with all the complications  . Coronary artery disease Sister   . Stroke Sister 9       CVA  . Peripheral vascular disease Sister   . Colon cancer Maternal Grandfather   . Cancer Neg Hx        breast or colon    Past Surgical History:  Procedure Laterality Date  . APPENDECTOMY    . AUGMENTATION MAMMAPLASTY Bilateral   . BRACHIOPLASTY Bilateral    Arm  Lift   . CARDIAC CATHETERIZATION    . CHOLECYSTECTOMY OPEN    . COLON SURGERY    . EXTERNAL FIXATION LEG Left 03/08/2017   Procedure: INTERNAL FIXATION LEFT ANKLE;  Surgeon: Newt Minion, MD;  Location: [facility];  Service: Orthopedics;  Laterality: Left;  . EYE MUSCLE SURGERY Bilateral X 2   "lazy eye; OR twice on each eye; Dr. Annamaria Boots"  . FRACTURE SURGERY    . LEFT HEART CATH AND CORONARY ANGIOGRAPHY N/A 10/27/2016   Procedure: Left Heart Cath and Coronary Angiography;  Surgeon: Peter M Martinique, MD;  Location: [place] [facility];  Service: Cardiovascular;  Laterality: N/A;  . LEFT HEART CATHETERIZATION WITH CORONARY ANGIOGRAM N/A 08/16/2014   Procedure: LEFT HEART CATHETERIZATION WITH CORONARY ANGIOGRAM;  Surgeon: Sinclair Grooms, MD;  Location: [facility] CATH LAB;  Service: Cardiovascular;  Laterality: N/A;  . NASAL SINUS SURGERY    . ORIF ANKLE FRACTURE Left 03/08/2017  . PARTIAL COLECTOMY  1970s  . TONSILLECTOMY     Social History   Occupational History  . 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 Smoking status: Former Smoker    Packs/day: 1.00    Years: 41.00    Types: Cigarettes    Quit date: 01/10/2010  . Smokeless tobacco: Never Used  . Alcohol use No  . Drug use: No  . Sexual activity: Not Currently    Partners: Female

## 2017-04-30 ENCOUNTER — Telehealth: Payer: Self-pay | Admitting: Neurology

## 2017-04-30 NOTE — Telephone Encounter (Signed)
Patient called and wants to sch for botox and was not sure if it needed prior auth and wants to have someone call her to let her know if it needs prior auth and how long that takes to get approved

## 2017-04-30 NOTE — Telephone Encounter (Signed)
Patient last seen in May for Botox. Will you need a follow up appt or just a Botox appt?

## 2017-04-30 NOTE — Telephone Encounter (Signed)
Pt left a voicemail message saying she wanted to cancel her appointment with Dr Tat and schedule an appointment for her botox but Dr Doristine Devoid next botox day is full so can you tell me if its ok to go to her next botox day

## 2017-04-30 NOTE — Telephone Encounter (Signed)
Next available Botox day is fine. Thanks!

## 2017-04-30 NOTE — Telephone Encounter (Signed)
Botox is okay but looks like she has upcoming surgeries scheduled and just had a surgery too.

## 2017-04-30 NOTE — Telephone Encounter (Signed)
I believe Dr. Doristine Devoid Botox is full until December, so you can go ahead and schedule her and we will work on authorization.

## 2017-05-03 ENCOUNTER — Encounter: Payer: Self-pay | Admitting: Orthopedic Surgery

## 2017-05-05 NOTE — Telephone Encounter (Signed)
Scheduled pt for 07/02/2017 for Botox, left a message for her to confirm if that is ok

## 2017-05-11 ENCOUNTER — Ambulatory Visit (INDEPENDENT_AMBULATORY_CARE_PROVIDER_SITE_OTHER): Payer: Medicare Other

## 2017-05-11 ENCOUNTER — Telehealth (INDEPENDENT_AMBULATORY_CARE_PROVIDER_SITE_OTHER): Payer: Self-pay | Admitting: Orthopedic Surgery

## 2017-05-11 ENCOUNTER — Encounter (INDEPENDENT_AMBULATORY_CARE_PROVIDER_SITE_OTHER): Payer: Self-pay | Admitting: Orthopedic Surgery

## 2017-05-11 ENCOUNTER — Ambulatory Visit (INDEPENDENT_AMBULATORY_CARE_PROVIDER_SITE_OTHER): Payer: Medicare Other | Admitting: Orthopedic Surgery

## 2017-05-11 DIAGNOSIS — S82872D Displaced pilon fracture of left tibia, subsequent encounter for closed fracture with routine healing: Secondary | ICD-10-CM | POA: Diagnosis not present

## 2017-05-11 NOTE — Telephone Encounter (Signed)
Advised patient to come in since she is having pain in surgical limb. Will need repeat xrays and in office eval to determine if ambulation with walker is safe. Advised her to come in today at 1215pm, put in 1:00pm slot today, we will see her at 1215pm. She has to pick her grandson from school at 2pm.

## 2017-05-11 NOTE — Telephone Encounter (Signed)
Patient called advised she is having a lot of pain after she use the walker. Patient said she is having to take 2 pain pills after using the walker. Patient said she is very concerned.The pain is going up from her ankle to her left knee. The number to contact patient is [phone]

## 2017-05-11 NOTE — Progress Notes (Signed)
Office Visit Note   Patient: [name]           Date of Birth: June 07, 1946           MRN: [id number] Visit Date: 05/11/2017              Requested by: Leighton Ruff, MD [place], [place] [address] PCP: Leighton Ruff, MD  Chief Complaint  Patient presents with  . Left Ankle - Follow-up      HPI: Patient presents in follow-up stating that she was having some increasing knee pain over the patella tendon as well as pain over the anterior aspect of her ankle.  Assessment & Plan: Visit Diagnoses:  1. Closed displaced pilon fracture of left tibia with routine healing, subsequent encounter     Plan: Will have the patient start physical therapy for range of motion dorsiflexion stretching of the ankle proprioception balance and strengthening.  Patient will wean out of the fracture boot but continue with the walker.  Prescription provided for [facility] physical therapy  Repeat 2 view radiographs of the left tibia at follow-up.  Follow-Up Instructions: Return in about 4 weeks (around 06/08/2017).   Ortho Exam  Patient is alert, oriented, no adenopathy, well-dressed, normal affect, normal respiratory effort. Examination patient has no swelling in the left lower extremity.  The calf is nontender to palpation no pain with dorsiflexion of the ankle no signs or symptoms of a DVT.  The ankle is tender to palpation anteriorly.  She lacks about 10 degrees of dorsiflexion to neutral.  Patient does have some tenderness to palpation over the patella tendon most likely due to altering her gait from using the fracture boot.  There is no redness no cellulitis no open wounds no signs of infection.  Imaging: Xr Tibia/fibula Left  Result Date: 05/11/2017 2 view radiographs of the left ankle shows stable internal fixation a congruent mortise with well healing of the fracture no hardware failure.  No images are attached to the encounter.  Labs: Lab Results  Component Value  Date   HGBA1C 5.8 04/24/2010   HGBA1C 5.7 08/08/2009   ESRSEDRATE 14 08/23/2012   REPTSTATUS 05/07/2008 FINAL 05/01/2008   CULT NO GROWTH 5 DAYS 05/01/2008    Orders:  Orders Placed This Encounter  Procedures  . XR Tibia/Fibula Left   No orders of the defined types were placed in this encounter.    Procedures: No procedures performed  Clinical Data: No additional findings.  ROS:  All other systems negative, except as noted in the HPI. Review of Systems  Objective: Vital Signs: There were no vitals taken for this visit.  Specialty Comments:  No specialty comments available.  PMFS History: Patient Active Problem List   Diagnosis Date Noted  . Closed pilon fracture, left, initial encounter 03/08/2017  . Closed fracture of left fibula and tibia   . Chest pain 10/27/2016  . Unstable angina ([place])   . CAD (coronary artery disease), native coronary artery   . Anxiety   . Irritable bowel syndrome   . Depression   . Hyperlipidemia   . History of migraines    Past Medical History:  Diagnosis Date  . Anxiety   . Arthritis    "back, neck" (03/08/2017)  . Bowel obstruction ([place])   . CAD (coronary artery disease), native coronary artery   . Chronic lower back pain   . Chronic neck pain   . Colon polyps   . Complication of anesthesia    "  I was awake during one of my surgeries"  . Depression   . Diverticulosis   . Hyperlipidemia   . Irritable bowel syndrome   . Migraine    "used to be severe; now maybe 1/year or 2" (03/08/2017)  . Pneumonia    H!N!    Family History  Problem Relation Age of Onset  . Heart disease Mother        CHF  . Coronary artery disease Mother   . Hypertension Mother   . Hyperlipidemia Mother   . Heart disease Father        CHF  . Coronary artery disease Father   . Diabetes Sister        with all the complications  . Coronary artery disease Sister   . Stroke Sister 23       CVA  . Peripheral vascular disease Sister   . Colon cancer  Maternal Grandfather   . Cancer Neg Hx        breast or colon    Past Surgical History:  Procedure Laterality Date  . APPENDECTOMY    . AUGMENTATION MAMMAPLASTY Bilateral   . BRACHIOPLASTY Bilateral    Arm Lift   . CARDIAC CATHETERIZATION    . CHOLECYSTECTOMY OPEN    . COLON SURGERY    . EXTERNAL FIXATION LEG Left 03/08/2017   Procedure: INTERNAL FIXATION LEFT ANKLE;  Surgeon: Newt Minion, MD;  Location: [facility];  Service: Orthopedics;  Laterality: Left;  . EYE MUSCLE SURGERY Bilateral X 2   "lazy eye; OR twice on each eye; Dr. Annamaria Boots"  . FRACTURE SURGERY    . LEFT HEART CATH AND CORONARY ANGIOGRAPHY N/A 10/27/2016   Procedure: Left Heart Cath and Coronary Angiography;  Surgeon: Peter M Martinique, MD;  Location: [place] [facility];  Service: Cardiovascular;  Laterality: N/A;  . LEFT HEART CATHETERIZATION WITH CORONARY ANGIOGRAM N/A 08/16/2014   Procedure: LEFT HEART CATHETERIZATION WITH CORONARY ANGIOGRAM;  Surgeon: Sinclair Grooms, MD;  Location: [facility] CATH LAB;  Service: Cardiovascular;  Laterality: N/A;  . NASAL SINUS SURGERY    . ORIF ANKLE FRACTURE Left 03/08/2017  . PARTIAL COLECTOMY  1970s  . TONSILLECTOMY     Social History   Occupational History  . 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 Smoking status: Former Smoker    Packs/day: 1.00    Years: 41.00    Types: Cigarettes    Quit date: 01/10/2010  . Smokeless tobacco: Never Used  . Alcohol use No  . Drug use: No  . Sexual activity: Not Currently    Partners: Female

## 2017-05-17 ENCOUNTER — Ambulatory Visit (INDEPENDENT_AMBULATORY_CARE_PROVIDER_SITE_OTHER): Payer: Medicare Other | Admitting: Orthopedic Surgery

## 2017-05-17 ENCOUNTER — Encounter (INDEPENDENT_AMBULATORY_CARE_PROVIDER_SITE_OTHER): Payer: Self-pay

## 2017-05-20 ENCOUNTER — Telehealth (INDEPENDENT_AMBULATORY_CARE_PROVIDER_SITE_OTHER): Payer: Self-pay | Admitting: Radiology

## 2017-05-20 NOTE — Telephone Encounter (Signed)
Call, dr office, fosamax would be appropriate

## 2017-05-20 NOTE — Telephone Encounter (Signed)
Dr. Drema Dallas would like to speak with Dr. Sharol Given regarding this patient. Dr. Drema Dallas is wanting to discuss patient starting on Fosamax. Her nurse Katie called and lmom about this. Please call them back @ [phone] and have either Dr. Drema Dallas or Joellen Jersey paged when you call back.

## 2017-05-21 NOTE — Telephone Encounter (Signed)
thanks

## 2017-05-31 ENCOUNTER — Ambulatory Visit: Payer: Medicare Other | Admitting: Neurology

## 2017-06-01 ENCOUNTER — Ambulatory Visit: Payer: Medicare Other | Admitting: Cardiovascular Disease

## 2017-06-01 ENCOUNTER — Encounter: Payer: Self-pay | Admitting: Cardiovascular Disease

## 2017-06-01 ENCOUNTER — Encounter: Payer: Self-pay | Admitting: Neurology

## 2017-06-01 VITALS — BP 116/60 | HR 72 | Ht 62.0 in | Wt 138.4 lb

## 2017-06-01 DIAGNOSIS — I2510 Atherosclerotic heart disease of native coronary artery without angina pectoris: Secondary | ICD-10-CM

## 2017-06-01 DIAGNOSIS — E782 Mixed hyperlipidemia: Secondary | ICD-10-CM | POA: Diagnosis not present

## 2017-06-01 LAB — BASIC METABOLIC PANEL
BUN/Creatinine Ratio: 23 (ref 12–28)
BUN: 24 mg/dL (ref 8–27)
CO2: 27 mmol/L (ref 20–29)
Calcium: 9.9 mg/dL (ref 8.7–10.3)
Chloride: 102 mmol/L (ref 96–106)
Creatinine, Ser: 1.06 mg/dL — ABNORMAL HIGH (ref 0.57–1.00)
GFR calc Af Amer: 62 mL/min/{1.73_m2} (ref >59)
GFR calc non Af Amer: 54 mL/min/{1.73_m2} — ABNORMAL LOW (ref >59)
Glucose: 91 mg/dL (ref 65–99)
Potassium: 4.4 mmol/L (ref 3.5–5.2)
Sodium: 142 mmol/L (ref 134–144)

## 2017-06-01 LAB — HEPATIC FUNCTION PANEL
ALT: 12 IU/L (ref 0–32)
AST: 18 IU/L (ref 0–40)
Albumin: 4.1 g/dL (ref 3.6–4.8)
Alkaline Phosphatase: 53 IU/L (ref 39–117)
Bilirubin Total: 0.3 mg/dL (ref 0.0–1.2)
Bilirubin, Direct: 0.09 mg/dL (ref 0.00–0.40)
Total Protein: 6.4 g/dL (ref 6.0–8.5)

## 2017-06-01 LAB — LIPID PANEL
Chol/HDL Ratio: 3.2 ratio (ref 0.0–4.4)
Cholesterol, Total: 215 mg/dL — ABNORMAL HIGH (ref 100–199)
HDL: 68 mg/dL (ref >39)
LDL Calculated: 128 mg/dL — ABNORMAL HIGH (ref 0–99)
Triglycerides: 95 mg/dL (ref 0–149)
VLDL Cholesterol Cal: 19 mg/dL (ref 5–40)

## 2017-06-01 NOTE — Patient Instructions (Signed)
Medication Instructions:  Your physician recommends that you continue on your current medications as directed. Please refer to the Current Medication list given to you today.   Labwork: TODAY - cholesterol, liver panel, basic metabolic panel   Testing/Procedures: None Ordered   Follow-Up: You have been referred to [facility] for management of your high cholesterol and intolerance of medications  Your physician wants you to follow-up in: 6 months with Dr. Acie Fredrickson.  You will receive a reminder letter in the mail two months in advance. If you don't receive a letter, please call our office to schedule the follow-up appointment.   If you need a refill on your cardiac medications before your next appointment, please call your pharmacy.   Thank you for choosing [facility]! Christen Bame, RN [phone]

## 2017-06-01 NOTE — Telephone Encounter (Signed)
This encounter was created in error - please disregard.

## 2017-06-01 NOTE — Progress Notes (Signed)
Cardiology Office Note   Date:  06/01/2017   ID:  Mirakle, Tomlin [birth year], MRN [id number]  PCP:  Leighton Ruff, MD  Cardiologist:   Mertie Moores, MD   Chief Complaint  Patient presents with  . Coronary Artery Disease   Problem List 1. Chest pain     Oct. 24, 2017:   [name] is a 71 y.o. female who presents for exertional dyspnea / heaviness  started several years ago. Has seen Dr. Wynonia Lawman in the past.  Abnormal stress test  Cath Feb. 4, 2016 showed mid LAD stenosis of 40-50%.    she has exertional chest tightness.  Mid sternal pain, radiates to back of both arms, radiates up into her neck and then to upper chest   Has to stop and rest for the pain to go away.   Walking uphill is worse.   Occurs more often if she has eaten and then exercises.  Has been active and healthy,  Walks regularly . Walked 2 miles a day and has had to slow down. Has slowly gained weight but no sudden weight gain   Nov. 29, 2017:  [name] is doing ok Still having some CP especially if she eats after exercising .  Has CP if she exercises 30 minutes - 1 hour after eat The CP is a tightness in her throat, radiates to her neck, out  through to her shoulders and down both arms  The symptoms are better after starting carvedilol.   The symptoms do still occur though.  October 26, 2016:  [name] presents as a work in visit . Has been having some chest pressure ,  Had just awakened. Turned over in bed and the pain resolved.  Did not have to take a SL NTG. Had some dyspnea.   The following day , she and her husband were walking,  She develeoped CP , asssociated with increased dyspnea.  Had to stop and rest .  Walked slower and made it home ok Last several minutes.   Yesterday she had recurrent CP .  Took a SL NTG prior to walking ( had just eaten and she frequently has CP if she walks after eating . )  Took 2 SL NTG yesterday .  No CP this am .   Her last last cath was in  Feb. 2016.  Had mild - moderate CAD by cath in Feb. 2016.    Dec 01, 2016:  [name] is seen today for follow up of her moderate CAD.  Had a cath in October 27, 2016 showed mild coronary irregularities - LAD 30%. LV EF is hyperdynamic .   We started Toprol XL 12.5 mg last week.   Did not tolerate the Atorvastatin ( abdominal pains, leg, back muscle cramps) .    Seems to be doing well.   No CP.  Had  Has not been walking .   June 01, 2017:   Seen with husband [name], [name]  seen today for follow-up of her moderate coronary artery disease. Tolerating   Feeling well Has not been exercising .   Broke her left leg this past summer - Tibia and fibula each in 4 spots.  Doing PT  / rehab.  So has not been exercising regularly   Has very high lipids intolerent to Atrova and rosuva       Past Medical History:  Diagnosis Date  . Anxiety   . Arthritis    "back, neck" (03/08/2017)  .  Bowel obstruction ([place])   . CAD (coronary artery disease), native coronary artery   . Chronic lower back pain   . Chronic neck pain   . Colon polyps   . Complication of anesthesia    "I was awake during one of my surgeries"  . Depression   . Diverticulosis   . Hyperlipidemia   . Irritable bowel syndrome   . Migraine    "used to be severe; now maybe 1/year or 2" (03/08/2017)  . Pneumonia    H!N!    Past Surgical History:  Procedure Laterality Date  . APPENDECTOMY    . AUGMENTATION MAMMAPLASTY Bilateral   . BRACHIOPLASTY Bilateral    Arm Lift   . CARDIAC CATHETERIZATION    . CHOLECYSTECTOMY OPEN    . COLON SURGERY    . EYE MUSCLE SURGERY Bilateral X 2   "lazy eye; OR twice on each eye; Dr. Annamaria Boots"  . FRACTURE SURGERY    . INTERNAL FIXATION LEFT ANKLE Left 03/08/2017   Performed by Newt Minion, MD at [facility]  . Left Heart Cath and Coronary Angiography N/A 10/27/2016   Performed by Martinique, Peter M, MD at [facility]  . LEFT HEART CATHETERIZATION WITH CORONARY ANGIOGRAM N/A 08/16/2014     Performed by Belva Crome, MD at [facility]  . NASAL SINUS SURGERY    . ORIF ANKLE FRACTURE Left 03/08/2017  . PARTIAL COLECTOMY  1970s  . TONSILLECTOMY       Current Outpatient Medications  Medication Sig Dispense Refill  . ALPRAZolam (XANAX) 0.5 MG tablet TAKE ONE TABLET BY MOUTH TWICE DAILY 60 tablet 5  . diclofenac (VOLTAREN) 75 MG EC tablet Take 75 mg by mouth 2 (two) times daily as needed for mild pain.    Marland Kitchen FLUoxetine (PROZAC) 40 MG capsule TAKE ONE CAPSULE BY MOUTH ONCE DAILY 30 capsule 0  . metoprolol succinate (TOPROL XL) 25 MG 24 hr tablet Take 0.5 tablets (12.5 mg total) by mouth daily. 30 tablet 11  . traZODone (DESYREL) 50 MG tablet TAKE 1 TABLET BY MOUTH AT BEDTIME (QUALITEST BRAND ONLY [phone]) 30 tablet 5   No current facility-administered medications for this visit.     Allergies:   Codeine; Shellfish-derived products; Crestor [rosuvastatin calcium]; and Lipitor [atorvastatin]    Social History:  The patient  reports that she quit smoking about 7 years ago. Her smoking use included cigarettes. She has a 41.00 pack-year smoking history. she has never used smokeless tobacco. She reports that she does not drink alcohol or use drugs.   Family History:  The patient's family history includes Colon cancer in her maternal grandfather; Coronary artery disease in her father, mother, and sister; Diabetes in her sister; Heart disease in her father and mother; Hyperlipidemia in her mother; Hypertension in her mother; Peripheral vascular disease in her sister; Stroke (age of onset: 13) in her sister.    ROS:  Please see the history of present illness.      All other systems are reviewed and negative.   Physical Exam: Blood pressure 116/60, pulse 72, height 5\' 2"  (1.575 m), weight 138 lb 6.4 oz (62.8 kg), SpO2 90 %.  GEN:  Well nourished, well developed in no acute distress HEENT: Normal NECK: No JVD; No carotid bruits LYMPHATICS: No lymphadenopathy CARDIAC: RR,  no murmurs, rubs, gallops RESPIRATORY:  Clear to auscultation without rales, wheezing or rhonchi  ABDOMEN: Soft, non-tender, non-distended MUSCULOSKELETAL:  No edema; No deformity  SKIN: Warm and  dry NEUROLOGIC:  Alert and oriented x 3   EKG:  EKG is not ordered today.  Recent Labs: 02/24/2017: ALT 13 03/08/2017: BUN 23; Creatinine, Ser 1.08; Hemoglobin 13.2; Platelets 229; Potassium 4.2; Sodium 139    Lipid Panel    Component Value Date/Time   CHOL 264 (H) 02/24/2017 0959   TRIG 114 02/24/2017 0959   HDL 64 02/24/2017 0959   CHOLHDL 4.1 02/24/2017 0959   CHOLHDL 3 04/24/2010 1413   VLDL 13.2 04/24/2010 1413   LDLCALC 177 (H) 02/24/2017 0959   LDLDIRECT 125.2 08/08/2009 1600      Wt Readings from Last 3 Encounters:  06/01/17 138 lb 6.4 oz (62.8 kg)  03/08/17 138 lb (62.6 kg)  12/01/16 144 lb (65.3 kg)      Other studies Reviewed: Additional studies/ records that were reviewed today include: . Review of the above records demonstrates:    ASSESSMENT AND PLAN:  1.  Chest pain . Has been stable.  She has not had any further episodes of chest pain.  She does have mild to moderate coronary artery disease.  2. Hyperlipidemia:  Her last LDL was 177.  Will check fasting labs today.  Will refer her to lipid clinic since she is intolerant of atorvastatin and rosuvastatin.   I'll see her again in 6 months for follow-up visit.   Mertie Moores, MD  06/01/2017 9:44 AM    [facility] [place] [address], [place], [place]  [address] Phone: [phone]; Fax: [phone]

## 2017-06-07 ENCOUNTER — Telehealth (INDEPENDENT_AMBULATORY_CARE_PROVIDER_SITE_OTHER): Payer: Self-pay | Admitting: Radiology

## 2017-06-07 NOTE — Telephone Encounter (Signed)
Lattie Haw is asking if patient needs premeds for her dental cleaning, since she had ORIF ankle in August.  I advised no.

## 2017-06-10 ENCOUNTER — Ambulatory Visit (INDEPENDENT_AMBULATORY_CARE_PROVIDER_SITE_OTHER): Payer: Medicare Other | Admitting: Orthopedic Surgery

## 2017-06-10 ENCOUNTER — Encounter (INDEPENDENT_AMBULATORY_CARE_PROVIDER_SITE_OTHER): Payer: Self-pay

## 2017-06-18 ENCOUNTER — Ambulatory Visit (INDEPENDENT_AMBULATORY_CARE_PROVIDER_SITE_OTHER): Payer: Medicare Other | Admitting: Pharmacist

## 2017-06-18 DIAGNOSIS — E7800 Pure hypercholesterolemia, unspecified: Secondary | ICD-10-CM

## 2017-06-18 MED ORDER — PRAVASTATIN SODIUM 20 MG PO TABS: 20.0000 mg | ORAL_TABLET | Freq: Every evening | ORAL | 11 refills | Status: DC

## 2017-06-18 NOTE — Patient Instructions (Signed)
It was nice to meet you today  Please start taking pravastatin 20mg  every evening  Call Dedria Endres in the lipid clinic with any concerns [phone]

## 2017-06-18 NOTE — Progress Notes (Signed)
Patient ID: CONTINA STRAIN                 DOB: Jul 17, 1945                    MRN: [id number]     HPI: [name] is a 71 y.o. female patient referred to lipid clinic by Dr Acie Fredrickson. PMH is significant for CAD, HLD, and angina. Left heart cath in April 2018 demonstrated prox LAD to mid LAD 30% stenosed. Pt has a history of statin intolerance and presents to clinic for further management.  Pt presents to clinic in good spirits with her husband. She has previously taken atorvastatin and rosuvastatin. She would experience extreme fatigue, joint, and muscle aches within 2-3 weeks of statin initiation. Her symptoms resolved within a few weeks of discontinuation in each case. She currently has a broken leg which is limiting her mobility. Pt eats a diet low in saturated fat. Pt questions why LDL was so different on lipid panels from August and November (decreased from 177 to 128). She was not taking medications for either of these panels and had not made any drastic changes in her diet or exercise regimen. Unclear why there was such a large LDL drop.  Current Medications: none Intolerances: atorvastatin 20mg  daily, rosuvastatin 10mg  and 20mg  daily - myalgias Risk Factors: angina, CAD, prior tobacco abuse LDL goal: 70mg /dL  Diet: Eats lean meats and fish. Does not eat any fried foods.  Exercise: Pt broke her leg a few months ago so exercise has been limited. Pt stayed active prior to this.  Family History: The patient's family history includes Colon cancer in her maternal grandfather; Coronary artery disease in her father, mother, and sister; Diabetes in her sister; Heart disease in her father and mother; Hyperlipidemia in her mother; Hypertension in her mother; Peripheral vascular disease in her sister; Stroke (age of onset: 77) in her sister.  Social History: The patient  reports that she quit smoking about 7 years ago. Her smoking use included cigarettes. She has a 41.00 pack-year smoking history.  she has never used smokeless tobacco. She reports that she does not drink alcohol or use drugs.   Labs: 06/01/2017: TC 215, TG 95, HDL 68, LDL 128 (no therapy) 02/24/2017: TC 264, TG 114, HDL 64, LDL 177 (no therapy)  Past Medical History:  Diagnosis Date  . Anxiety   . Arthritis    "back, neck" (03/08/2017)  . Bowel obstruction ([place])   . CAD (coronary artery disease), native coronary artery   . Chronic lower back pain   . Chronic neck pain   . Colon polyps   . Complication of anesthesia    "I was awake during one of my surgeries"  . Depression   . Diverticulosis   . Hyperlipidemia   . Irritable bowel syndrome   . Migraine    "used to be severe; now maybe 1/year or 2" (03/08/2017)  . Pneumonia    H!N!    Current Outpatient Medications on File Prior to Visit  Medication Sig Dispense Refill  . ALPRAZolam (XANAX) 0.5 MG tablet TAKE ONE TABLET BY MOUTH TWICE DAILY 60 tablet 5  . diclofenac (VOLTAREN) 75 MG EC tablet Take 75 mg by mouth 2 (two) times daily as needed for mild pain.    Marland Kitchen FLUoxetine (PROZAC) 40 MG capsule TAKE ONE CAPSULE BY MOUTH ONCE DAILY 30 capsule 0  . metoprolol succinate (TOPROL XL) 25 MG 24 hr tablet Take 0.5 tablets (  12.5 mg total) by mouth daily. 30 tablet 11  . traZODone (DESYREL) 50 MG tablet TAKE 1 TABLET BY MOUTH AT BEDTIME (QUALITEST BRAND ONLY [phone]) 30 tablet 5   No current facility-administered medications on file prior to visit.     Allergies  Allergen Reactions  . Codeine Nausea And Vomiting  . Shellfish-Derived Products Anaphylaxis, Shortness Of Breath and Swelling    Throat swells!!  . Crestor [Rosuvastatin Calcium]     Patient doesn't recall reaction  . Lipitor [Atorvastatin] Nausea Only and Other (See Comments)    Fatigue and Lethargy. Leg, back,Joint, and muscle pain. Per patient "My stool was a muddy clay color and I having diarrhea" pt states "I never want to take another statin again"    Assessment/Plan:  1. Hyperlipidemia  - LDL currently above goal < 70 due to hx of chest pain and nonobstructive CAD. Pt is intolerant to Crestor and Lipitor. Discussed rechallenging with low dose statin, Zetia, or PCSK9i therapy. Pt prefers to rechallenge with statin therapy. Will start pravastatin 20mg  HS and advised pt to call with any adverse reactions. Will call pt in 1 month to assess tolerability and schedule f/u labs at that time if she is tolerating well. If not, can pursue Livalo, Zetia, or PCSK9i.   [name], PharmD, CPP, [facility] [address] [place], [place] [address] Phone: [phone]; Fax: [phone] 06/18/2017 10:38 AM

## 2017-06-21 ENCOUNTER — Ambulatory Visit (INDEPENDENT_AMBULATORY_CARE_PROVIDER_SITE_OTHER): Payer: Medicare Other | Admitting: Orthopedic Surgery

## 2017-06-22 ENCOUNTER — Ambulatory Visit (INDEPENDENT_AMBULATORY_CARE_PROVIDER_SITE_OTHER): Payer: Medicare Other

## 2017-06-22 ENCOUNTER — Encounter (INDEPENDENT_AMBULATORY_CARE_PROVIDER_SITE_OTHER): Payer: Self-pay | Admitting: Orthopedic Surgery

## 2017-06-22 ENCOUNTER — Ambulatory Visit (INDEPENDENT_AMBULATORY_CARE_PROVIDER_SITE_OTHER): Payer: Medicare Other | Admitting: Orthopedic Surgery

## 2017-06-22 DIAGNOSIS — M25572 Pain in left ankle and joints of left foot: Secondary | ICD-10-CM

## 2017-06-22 DIAGNOSIS — S82872D Displaced pilon fracture of left tibia, subsequent encounter for closed fracture with routine healing: Secondary | ICD-10-CM

## 2017-06-22 NOTE — Progress Notes (Signed)
Office Visit Note   Patient: [name]           Date of Birth: [birth year]           MRN: [id number] Visit Date: 06/22/2017              Requested by: Leighton Ruff, MD [place], [place] [address] PCP: Leighton Ruff, MD  No chief complaint on file.     HPI: Patient presents little over 3 months status post open reduction internal fixation for a pilon fracture of the left ankle involving both the tibia and fibula.  Patient complains of swelling by the end of the day she states she had difficulty with compression stockings she states she has pain now when she is not using the fracture boot and is using her walker.  Patient feels the pain is from a bump that she can feel medially. Assessment & Plan: Visit Diagnoses:  1. Pain in left ankle and joints of left foot   2. Closed displaced pilon fracture of left tibia with routine healing, subsequent encounter     Plan: Recommend that she get back in her fracture boot discontinue the walker the proper way to wear the compression socks was discussed she will start off with an hour of the sock in the morning and gradually increase her use.  Reevaluation in 4 weeks with repeat three-view radiographs of the left ankle.  Follow-Up Instructions: Return in about 4 weeks (around 07/20/2017).   Ortho Exam  Patient is alert, oriented, no adenopathy, well-dressed, normal affect, normal respiratory effort. Examination patient does ambulate with a limp.  She has a good pulse she has good range of motion of the ankle which is pain-free there is a prominence medially which is either the calcium deposit or 1 of the screws which are prominent about 2 mm.  There is excellent callus formation with no avascular necrosis.  Imaging: Xr Tibia/fibula Left  Result Date: 06/22/2017 3 view radiographs of the left ankle shows stable internal fixation no hardware failure excellent callus formation no evidence of any avascular necrosis.   There are 2 screws prominent medially as well as some calcium deposits medially.  No images are attached to the encounter.  Labs: Lab Results  Component Value Date   HGBA1C 5.8 04/24/2010   HGBA1C 5.7 08/08/2009   ESRSEDRATE 14 08/23/2012   REPTSTATUS 05/07/2008 FINAL 05/01/2008   CULT NO GROWTH 5 DAYS 05/01/2008    @LABSALLVALUES (HGBA1)@  There is no height or weight on file to calculate BMI.  Orders:  Orders Placed This Encounter  Procedures  . XR Tibia/Fibula Left   No orders of the defined types were placed in this encounter.    Procedures: No procedures performed  Clinical Data: No additional findings.  ROS:  All other systems negative, except as noted in the HPI. Review of Systems  Objective: Vital Signs: There were no vitals taken for this visit.  Specialty Comments:  No specialty comments available.  PMFS History: Patient Active Problem List   Diagnosis Date Noted  . Closed pilon fracture, left, initial encounter 03/08/2017  . Closed fracture of left fibula and tibia   . Chest pain 10/27/2016  . Unstable angina ([place])   . CAD (coronary artery disease), native coronary artery   . Anxiety   . Irritable bowel syndrome   . Depression   . Hyperlipidemia   . History of migraines    Past Medical History:  Diagnosis Date  .  Anxiety   . Arthritis    "back, neck" (03/08/2017)  . Bowel obstruction ([place])   . CAD (coronary artery disease), native coronary artery   . Chronic lower back pain   . Chronic neck pain   . Colon polyps   . Complication of anesthesia    "I was awake during one of my surgeries"  . Depression   . Diverticulosis   . Hyperlipidemia   . Irritable bowel syndrome   . Migraine    "used to be severe; now maybe 1/year or 2" (03/08/2017)  . Pneumonia    H!N!    Family History  Problem Relation Age of Onset  . Heart disease Mother        CHF  . Coronary artery disease Mother   . Hypertension Mother   . Hyperlipidemia Mother   .  Heart disease Father        CHF  . Coronary artery disease Father   . Diabetes Sister        with all the complications  . Coronary artery disease Sister   . Stroke Sister 55       CVA  . Peripheral vascular disease Sister   . Colon cancer Maternal Grandfather   . Cancer Neg Hx        breast or colon    Past Surgical History:  Procedure Laterality Date  . APPENDECTOMY    . AUGMENTATION MAMMAPLASTY Bilateral   . BRACHIOPLASTY Bilateral    Arm Lift   . CARDIAC CATHETERIZATION    . CHOLECYSTECTOMY OPEN    . COLON SURGERY    . EXTERNAL FIXATION LEG Left 03/08/2017   Procedure: INTERNAL FIXATION LEFT ANKLE;  Surgeon: Newt Minion, MD;  Location: [facility];  Service: Orthopedics;  Laterality: Left;  . EYE MUSCLE SURGERY Bilateral X 2   "lazy eye; OR twice on each eye; Dr. Annamaria Boots"  . FRACTURE SURGERY    . LEFT HEART CATH AND CORONARY ANGIOGRAPHY N/A 10/27/2016   Procedure: Left Heart Cath and Coronary Angiography;  Surgeon: Peter M Martinique, MD;  Location: [facility];  Service: Cardiovascular;  Laterality: N/A;  . LEFT HEART CATHETERIZATION WITH CORONARY ANGIOGRAM N/A 08/16/2014   Procedure: LEFT HEART CATHETERIZATION WITH CORONARY ANGIOGRAM;  Surgeon: Sinclair Grooms, MD;  Location: [facility] CATH LAB;  Service: Cardiovascular;  Laterality: N/A;  . NASAL SINUS SURGERY    . ORIF ANKLE FRACTURE Left 03/08/2017  . PARTIAL COLECTOMY  1970s  . TONSILLECTOMY     Social History   Occupational History  . Occupation: Retired Merchant navy officer)     Employer: RETIRED  Tobacco Use  . Smoking status: Former Smoker    Packs/day: 1.00    Years: 41.00    Pack years: 41.00    Types: Cigarettes    Last attempt to quit: 01/10/2010    Years since quitting: 7.4  . Smokeless tobacco: Never Used  Substance and Sexual Activity  . Alcohol use: No  . Drug use: No  . Sexual activity: Not Currently    Partners: Female

## 2017-07-01 ENCOUNTER — Encounter (INDEPENDENT_AMBULATORY_CARE_PROVIDER_SITE_OTHER): Payer: Self-pay

## 2017-07-01 ENCOUNTER — Ambulatory Visit (INDEPENDENT_AMBULATORY_CARE_PROVIDER_SITE_OTHER): Payer: Medicare Other | Admitting: Orthopedic Surgery

## 2017-07-02 ENCOUNTER — Ambulatory Visit (INDEPENDENT_AMBULATORY_CARE_PROVIDER_SITE_OTHER): Payer: Medicare Other | Admitting: Neurology

## 2017-07-02 DIAGNOSIS — G5139 Clonic hemifacial spasm, unspecified: Secondary | ICD-10-CM

## 2017-07-02 DIAGNOSIS — G245 Blepharospasm: Secondary | ICD-10-CM | POA: Diagnosis not present

## 2017-07-02 MED ORDER — ONABOTULINUMTOXINA 100 UNITS IJ SOLR
22.5000 [IU] | Freq: Once | INTRAMUSCULAR | Status: AC
  Administered 2017-07-02: 22.5 [IU] via INTRAMUSCULAR

## 2017-07-02 NOTE — Procedures (Signed)
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  Been 7 months and sx's just returned a few weeks ago.  No asymmetry of face.  No spasm noted today on clinical examination but both sides done as she was previously experiencing some asymmetry due to use of botox  Consent obtained from: The patient Benefits discussed included, but were not limited to decreased muscle tightness, increased joint range of motion, and decreased pain.  Risk discussed included, but were not limited pain and discomfort, bleeding, bruising, excessive weakness, venous thrombosis, muscle atrophy and dysphagia.  A copy of the patient medication guide was given to the patient which explains the blackbox warning.  Patients identity and treatment sites confirmed yes.  Details of Procedure: Skin was cleaned with alcohol.   Prior to injection, the needle plunger was aspirated to make sure the needle was not within a blood vessel.  There was no blood retrieved on aspiration.    Following is a summary of the muscles injected  And the amount of Botulinum toxin used:  Injections  Location Left  Right Units Number of sites        Corrugator 2.5  2.5   Procerus 2.5 2.5 5.0   Lower Lid, Lateral       Lower Lid Medial  2.5    2.5    Upper Lid, Lateral 2.5   2.5   Upper Lid, Medial      Canthus 2.5 2.5 5.0   Nasalis      [place]      Temporalis 2.5  2.5   Zygomaticus Major 2.5  2.5   TOTAL UNITS:   22.5    Agent: Botulinum Type A ( Onobotulinum Toxin type A ).  1 vials of Botox were used, each containing 50 units and freshly diluted with 2 mL of sterile, non-perserved saline   Total injected (Units): 22.5  Total wasted (Units): 7.5 Pt tolerated procedure well without complications.   Reinjection is anticipated in 3 months.

## 2017-07-08 ENCOUNTER — Telehealth (INDEPENDENT_AMBULATORY_CARE_PROVIDER_SITE_OTHER): Payer: Self-pay

## 2017-07-08 NOTE — Telephone Encounter (Signed)
See below

## 2017-07-08 NOTE — Telephone Encounter (Signed)
LMOM for patient of the below message and to go to the ER if pain persists

## 2017-07-08 NOTE — Telephone Encounter (Signed)
I can only see her at her appointment with me

## 2017-07-08 NOTE — Telephone Encounter (Signed)
Patient called and states she last saw Dr Sharol Given and is not satisfied on what Dr Sharol Given has done for her. Cannot walk w/o boot. She is having so much pain. She called the insurance nurse line and they suggested she needed to have a Doppler done. Weightbearing with walker. Compression socks were too tight she said. Would like a CB to discuss what can be done. She said she switched to Dr Ninfa Linden instead and does not want to see MD again. Has appt scheduled with Dr Ninfa Linden 07/21/17.

## 2017-07-15 ENCOUNTER — Ambulatory Visit (INDEPENDENT_AMBULATORY_CARE_PROVIDER_SITE_OTHER): Payer: Medicare Other | Admitting: Orthopedic Surgery

## 2017-07-20 ENCOUNTER — Telehealth: Payer: Self-pay | Admitting: Pharmacist

## 2017-07-20 DIAGNOSIS — E782 Mixed hyperlipidemia: Secondary | ICD-10-CM

## 2017-07-20 NOTE — Telephone Encounter (Signed)
Called pt to f/u with pravastatin tolerability. She reports doing well on pravastatin 20mg  daily although she broke her leg recently which has been causing pain along with her hip pain and back pain (she is unsure of the cause of this). Discussed increasing dose of pravastatin to 40mg  daily and pt prefers to continue on pravastatin 20mg  and recheck labs before making any dosing changes. Scheduled lipid panel and LFTs in 6 weeks and can assess response to pravastatin at that time to see if further dose increases are warranted.

## 2017-07-21 ENCOUNTER — Encounter (INDEPENDENT_AMBULATORY_CARE_PROVIDER_SITE_OTHER): Payer: Self-pay | Admitting: Orthopaedic Surgery

## 2017-07-21 ENCOUNTER — Ambulatory Visit (INDEPENDENT_AMBULATORY_CARE_PROVIDER_SITE_OTHER): Payer: Medicare Other

## 2017-07-21 ENCOUNTER — Ambulatory Visit (INDEPENDENT_AMBULATORY_CARE_PROVIDER_SITE_OTHER): Payer: Medicare Other | Admitting: Orthopaedic Surgery

## 2017-07-21 DIAGNOSIS — M25572 Pain in left ankle and joints of left foot: Secondary | ICD-10-CM

## 2017-07-21 DIAGNOSIS — S82402M Unspecified fracture of shaft of left fibula, subsequent encounter for open fracture type I or II with nonunion: Secondary | ICD-10-CM

## 2017-07-21 DIAGNOSIS — S82202M Unspecified fracture of shaft of left tibia, subsequent encounter for open fracture type I or II with nonunion: Secondary | ICD-10-CM | POA: Diagnosis not present

## 2017-07-21 NOTE — Progress Notes (Signed)
Office Visit Note   Patient: [name]           Date of Birth: 07/28/1945           MRN: [id number] Visit Date: 07/21/2017              Requested by: Leighton Ruff, MD [place], [place] [address] PCP: Leighton Ruff, MD   Assessment & Plan: Visit Diagnoses:  1. Pain in left ankle and joints of left foot   2. 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  At this point we are recommending removal of hardware from both the tibia and fibula.  We can then stressed the fracture under direct visualization and fluoroscopy and then perform a revision open reduction internal fixation with potentially a medial plate with bone grafting versus an intramedullary nail at this standpoint.  She understands intramedullary nail may be too difficult due to reproducing a medullary canal for the ride to go through but certainly will be able to assess this at the time of surgery.  She is still looking at being nonweightbearing from now until at least 6-8 weeks postoperative pending the outcome of surgery.  We had a long and thorough discussion about this including the risk and benefits of surgery and all questions and concerns were answered and addressed.  We will work on getting the surgery scheduled in the near future.  She would like me to perform this.  We would then see her back at 2 weeks postoperative for suture removal if indicated as well as a new x-rays.  Follow-Up Instructions: Return for 2 weeks post-op.   Orders:  Orders Placed This Encounter  Procedures  . XR Ankle Complete Left   No orders of the defined types were placed in this encounter.     Procedures: No procedures performed   Clinical Data: No additional findings.   Subjective: No chief complaint on file. The  patient comes in today mainly for a second opinion.  This is to address a significant injury to her left tibia and fibula that occurred in August of this past year.  She is now getting close to 5 months out from surgery.  She is ambulate in a cam walking boot and over the last month has developed severe pain in her left ankle.  Her original injury occurred again in August and this was an open fracture.  She was taken to the operating room by 1 of my partners and underwent anterior lateral plating of the tibia fracture which was a distal fracture that was extra-articular.  There was also plating of the fibula.  She had close follow-up visit over the next several months and when x-rays began to show healing her weightbearing was advanced.  However now her pain is become quite severe and she feels like the leg is becoming deformed in terms of its appearance.  She is not a diabetic and not a smoker.  HPI  Review of Systems She currently denies any headache, chest pain, shortness of breath, fever, chills, nausea, vomiting.  Objective: Vital Signs: There were no vitals taken for this visit.  Physical Exam She is alert and oriented x3 and in no acute distress Ortho Exam Examination of her left ankle shows well-healed scars and incisions.  Her foot is well-perfused and she describes normal sensation.  Clinically you can see that her leg is slightly malaligned with varus malalignment.  She has significant amount of pain when I stress the leg and I feel like there is some slight motion of the fracture site itself.  Her knee exam is normal. Specialty Comments:  No specialty comments available.  Imaging: Xr Ankle Complete Left  Result Date: 07/21/2017 X-rays of the left ankle show nonunion of a distal tibia fracture with varus malalignment and hardware failure with a break through the anterior-lateral tibial plate.  The plate on the fibula also appears to be pulling off the bone distally.  There are sclerotic  changes in the bone as well.    PMFS History: Patient Active Problem List   Diagnosis Date Noted  . Open fracture of tibia and fibula, shaft, left, type I or II, with nonunion, subsequent encounter 07/21/2017  . Closed pilon fracture, left, initial encounter 03/08/2017  . Closed fracture of left fibula and tibia   . Chest pain 10/27/2016  . Unstable angina ([place])   . CAD (coronary artery disease), native coronary artery   . Anxiety   . Irritable bowel syndrome   . Depression   . Hyperlipidemia   . History of migraines    Past Medical History:  Diagnosis Date  . Anxiety   . Arthritis    "back, neck" (03/08/2017)  . Bowel obstruction ([place])   . CAD (coronary artery disease), native coronary artery   . Chronic lower back pain   . Chronic neck pain   . Colon polyps   . Complication of anesthesia    "I was awake during one of my surgeries"  . Depression   . Diverticulosis   . Hyperlipidemia   . Irritable bowel syndrome   . Migraine    "used to be severe; now maybe 1/year or 2" (03/08/2017)  . Pneumonia    H!N!    Family History  Problem Relation Age of Onset  . Heart disease Mother        CHF  . Coronary artery disease Mother   . Hypertension Mother   . Hyperlipidemia Mother   . Heart disease Father        CHF  . Coronary artery disease Father   . Diabetes Sister        with all the complications  . Coronary artery disease Sister   . Stroke Sister 58       CVA  . Peripheral vascular disease Sister   . Colon cancer Maternal Grandfather   . Cancer Neg Hx        breast or colon    Past Surgical History:  Procedure Laterality Date  . APPENDECTOMY    . AUGMENTATION MAMMAPLASTY Bilateral   . BRACHIOPLASTY Bilateral    Arm Lift   . CARDIAC CATHETERIZATION    . CHOLECYSTECTOMY OPEN    . COLON SURGERY    . EXTERNAL FIXATION LEG Left 03/08/2017   Procedure: INTERNAL FIXATION LEFT ANKLE;  Surgeon: Newt Minion, MD;  Location: [facility];  Service: Orthopedics;   Laterality: Left;  . EYE MUSCLE SURGERY Bilateral X 2   "lazy eye; OR twice on each eye; Dr. Annamaria Boots"  . FRACTURE SURGERY    . LEFT HEART CATH AND CORONARY ANGIOGRAPHY N/A 10/27/2016   Procedure: Left Heart Cath and Coronary Angiography;  Surgeon: Peter M Martinique, MD;  Location: [facility];  Service: Cardiovascular;  Laterality: N/A;  .  LEFT HEART CATHETERIZATION WITH CORONARY ANGIOGRAM N/A 08/16/2014   Procedure: LEFT HEART CATHETERIZATION WITH CORONARY ANGIOGRAM;  Surgeon: Sinclair Grooms, MD;  Location: [facility] CATH LAB;  Service: Cardiovascular;  Laterality: N/A;  . NASAL SINUS SURGERY    . ORIF ANKLE FRACTURE Left 03/08/2017  . PARTIAL COLECTOMY  1970s  . TONSILLECTOMY     Social History   Occupational History  . Occupation: Retired Merchant navy officer)     Employer: RETIRED  Tobacco Use  . Smoking status: Former Smoker    Packs/day: 1.00    Years: 41.00    Pack years: 41.00    Types: Cigarettes    Last attempt to quit: 01/10/2010    Years since quitting: 7.5  . Smokeless tobacco: Never Used  Substance and Sexual Activity  . Alcohol use: No  . Drug use: No  . Sexual activity: Not Currently    Partners: Female

## 2017-07-22 ENCOUNTER — Ambulatory Visit (INDEPENDENT_AMBULATORY_CARE_PROVIDER_SITE_OTHER): Payer: Medicare Other | Admitting: Orthopedic Surgery

## 2017-07-26 ENCOUNTER — Ambulatory Visit (INDEPENDENT_AMBULATORY_CARE_PROVIDER_SITE_OTHER): Payer: Medicare Other | Admitting: Orthopaedic Surgery

## 2017-07-26 ENCOUNTER — Encounter (HOSPITAL_COMMUNITY): Payer: Self-pay | Admitting: *Deleted

## 2017-07-26 ENCOUNTER — Encounter (INDEPENDENT_AMBULATORY_CARE_PROVIDER_SITE_OTHER): Payer: Self-pay | Admitting: Orthopaedic Surgery

## 2017-07-26 DIAGNOSIS — S82402M Unspecified fracture of shaft of left fibula, subsequent encounter for open fracture type I or II with nonunion: Secondary | ICD-10-CM

## 2017-07-26 DIAGNOSIS — S82202M Unspecified fracture of shaft of left tibia, subsequent encounter for open fracture type I or II with nonunion: Secondary | ICD-10-CM

## 2017-07-26 NOTE — Progress Notes (Signed)
The patient is here today again for continued questions as relates to her distal tibia fracture nonunion with failure of the hardware and a broken plate.  We had a long and thorough discussion with her last week about setting up the surgery for this.  This would include removing the hardware and making sure she does not have any infection and then treating the fracture accordingly with the potential bone grafting.  We went over her x-rays again had a long and thorough discussion about surgery including the risk and benefits and what her intraoperative and postoperative course were involved.  At this point she understands the reasoning behind proceeding and I answered all of her additional questions.  Please refer again to the note from last week as it relates to the surgery in general.  We do see her back postoperatively he would have a repeat 3 views of the left ankle.

## 2017-07-26 NOTE — Progress Notes (Signed)
Patient denies chest pain or shortness of breath. Reviewed cardiac history with Dr. Fransisco Beau nothing further needed than what is in chart.

## 2017-07-27 ENCOUNTER — Inpatient Hospital Stay (HOSPITAL_COMMUNITY): Payer: Medicare Other | Admitting: Certified Registered Nurse Anesthetist

## 2017-07-27 ENCOUNTER — Other Ambulatory Visit: Payer: Self-pay

## 2017-07-27 ENCOUNTER — Inpatient Hospital Stay (HOSPITAL_COMMUNITY): Payer: Medicare Other

## 2017-07-27 ENCOUNTER — Encounter (HOSPITAL_COMMUNITY): Payer: Self-pay

## 2017-07-27 ENCOUNTER — Encounter (HOSPITAL_COMMUNITY)
Admission: RE | Disposition: A | Discharge status: Home / Self Care | Payer: Self-pay | Source: Ambulatory Visit | Attending: Orthopaedic Surgery

## 2017-07-27 ENCOUNTER — Inpatient Hospital Stay (HOSPITAL_COMMUNITY)
Admission: RE | Admit: 2017-07-27 | Discharge: 2017-07-29 | DRG: 465 | Disposition: A | Discharge status: Home / Self Care | Payer: Medicare Other | Source: Ambulatory Visit | Attending: Orthopaedic Surgery | Admitting: Orthopaedic Surgery

## 2017-07-27 ENCOUNTER — Other Ambulatory Visit (INDEPENDENT_AMBULATORY_CARE_PROVIDER_SITE_OTHER): Payer: Self-pay | Admitting: Physician Assistant

## 2017-07-27 DIAGNOSIS — E785 Hyperlipidemia, unspecified: Secondary | ICD-10-CM | POA: Diagnosis present

## 2017-07-27 DIAGNOSIS — Z888 Allergy status to other drugs, medicaments and biological substances status: Secondary | ICD-10-CM | POA: Diagnosis not present

## 2017-07-27 DIAGNOSIS — Z86010 Personal history of colonic polyps: Secondary | ICD-10-CM | POA: Diagnosis not present

## 2017-07-27 DIAGNOSIS — Z91013 Allergy to seafood: Secondary | ICD-10-CM

## 2017-07-27 DIAGNOSIS — F419 Anxiety disorder, unspecified: Secondary | ICD-10-CM | POA: Diagnosis present

## 2017-07-27 DIAGNOSIS — Z87891 Personal history of nicotine dependence: Secondary | ICD-10-CM | POA: Diagnosis not present

## 2017-07-27 DIAGNOSIS — M84462K Pathological fracture, left tibia, subsequent encounter for fracture with nonunion: Principal | ICD-10-CM | POA: Diagnosis present

## 2017-07-27 DIAGNOSIS — Z8249 Family history of ischemic heart disease and other diseases of the circulatory system: Secondary | ICD-10-CM

## 2017-07-27 DIAGNOSIS — M84464K Pathological fracture, left fibula, subsequent encounter for fracture with nonunion: Secondary | ICD-10-CM | POA: Diagnosis present

## 2017-07-27 DIAGNOSIS — S82402M Unspecified fracture of shaft of left fibula, subsequent encounter for open fracture type I or II with nonunion: Secondary | ICD-10-CM

## 2017-07-27 DIAGNOSIS — Z885 Allergy status to narcotic agent status: Secondary | ICD-10-CM | POA: Diagnosis not present

## 2017-07-27 DIAGNOSIS — T84098D Other mechanical complication of other internal joint prosthesis, subsequent encounter: Secondary | ICD-10-CM | POA: Diagnosis not present

## 2017-07-27 DIAGNOSIS — I2510 Atherosclerotic heart disease of native coronary artery without angina pectoris: Secondary | ICD-10-CM | POA: Diagnosis present

## 2017-07-27 DIAGNOSIS — G8929 Other chronic pain: Secondary | ICD-10-CM | POA: Diagnosis present

## 2017-07-27 DIAGNOSIS — Z79899 Other long term (current) drug therapy: Secondary | ICD-10-CM | POA: Diagnosis not present

## 2017-07-27 DIAGNOSIS — M542 Cervicalgia: Secondary | ICD-10-CM | POA: Diagnosis present

## 2017-07-27 DIAGNOSIS — S82252K Displaced comminuted fracture of shaft of left tibia, subsequent encounter for closed fracture with nonunion: Secondary | ICD-10-CM | POA: Diagnosis present

## 2017-07-27 DIAGNOSIS — Y848 Other medical procedures as the cause of abnormal reaction of the patient, or of later complication, without mention of misadventure at the time of the procedure: Secondary | ICD-10-CM | POA: Diagnosis present

## 2017-07-27 DIAGNOSIS — S82202M Unspecified fracture of shaft of left tibia, subsequent encounter for open fracture type I or II with nonunion: Secondary | ICD-10-CM

## 2017-07-27 DIAGNOSIS — M79604 Pain in right leg: Secondary | ICD-10-CM | POA: Diagnosis present

## 2017-07-27 DIAGNOSIS — Z419 Encounter for procedure for purposes other than remedying health state, unspecified: Secondary | ICD-10-CM

## 2017-07-27 HISTORY — PX: HARDWARE REMOVAL: SHX979

## 2017-07-27 HISTORY — PX: ANKLE HARDWARE REMOVAL: SHX1149

## 2017-07-27 HISTORY — DX: Unspecified asthma, uncomplicated: J45.909

## 2017-07-27 HISTORY — PX: ORIF ANKLE FRACTURE: SHX5408

## 2017-07-27 HISTORY — DX: Unspecified fracture of shaft of left tibia, initial encounter for closed fracture: S82.202A

## 2017-07-27 LAB — CBC
HCT: 40.6 % (ref 36.0–46.0)
Hemoglobin: 13.3 g/dL (ref 12.0–15.0)
MCH: 29.2 pg (ref 26.0–34.0)
MCHC: 32.8 g/dL (ref 30.0–36.0)
MCV: 89.2 fL (ref 78.0–100.0)
Platelets: 290 10*3/uL (ref 150–400)
RBC: 4.55 MIL/uL (ref 3.87–5.11)
RDW: 13.4 % (ref 11.5–15.5)
WBC: 6.4 10*3/uL (ref 4.0–10.5)

## 2017-07-27 LAB — BASIC METABOLIC PANEL
Anion gap: 8 (ref 5–15)
BUN: 27 mg/dL — ABNORMAL HIGH (ref 6–20)
CO2: 24 mmol/L (ref 22–32)
Calcium: 9.3 mg/dL (ref 8.9–10.3)
Chloride: 106 mmol/L (ref 101–111)
Creatinine, Ser: 1.04 mg/dL — ABNORMAL HIGH (ref 0.44–1.00)
GFR calc Af Amer: >60 mL/min (ref >60)
GFR calc non Af Amer: 54 mL/min — ABNORMAL LOW (ref >60)
Glucose, Bld: 83 mg/dL (ref 65–99)
Potassium: 4.1 mmol/L (ref 3.5–5.1)
Sodium: 138 mmol/L (ref 135–145)

## 2017-07-27 SURGERY — OPEN REDUCTION INTERNAL FIXATION (ORIF) ANKLE FRACTURE
Anesthesia: General | Site: Ankle | Laterality: Left

## 2017-07-27 MED ORDER — FENTANYL CITRATE (PF) 100 MCG/2ML IJ SOLN
75.0000 ug | Freq: Once | INTRAMUSCULAR | Status: AC
  Administered 2017-07-27: 75 ug via INTRAVENOUS
  Filled 2017-07-27: qty 1.5

## 2017-07-27 MED ORDER — DIPHENHYDRAMINE HCL 12.5 MG/5ML PO ELIX: 12.5000 mg | ORAL_SOLUTION | ORAL | Status: DC | PRN

## 2017-07-27 MED ORDER — HYDROMORPHONE HCL 1 MG/ML IJ SOLN
0.5000 mg | INTRAMUSCULAR | Status: DC | PRN
  Administered 2017-07-27: 0.5 mg via INTRAVENOUS
  Administered 2017-07-28 (×4): 1 mg via INTRAVENOUS
  Filled 2017-07-27 (×5): qty 1

## 2017-07-27 MED ORDER — HYDROMORPHONE HCL 1 MG/ML IJ SOLN
0.2500 mg | INTRAMUSCULAR | Status: DC | PRN
Start: 2017-07-27 — End: 2017-07-27
  Administered 2017-07-27 (×3): 0.5 mg via INTRAVENOUS

## 2017-07-27 MED ORDER — SODIUM CHLORIDE 0.9 % IV SOLN
INTRAVENOUS | Status: DC
  Administered 2017-07-28: 15:00:00 via INTRAVENOUS
  Administered 2017-07-28: 75 mL/h via INTRAVENOUS

## 2017-07-27 MED ORDER — BUPIVACAINE-EPINEPHRINE (PF) 0.5% -1:200000 IJ SOLN
INTRAMUSCULAR | Status: DC | PRN
  Administered 2017-07-27: 30 mL via PERINEURAL

## 2017-07-27 MED ORDER — CEFAZOLIN SODIUM-DEXTROSE 2-4 GM/100ML-% IV SOLN
2.0000 g | INTRAVENOUS | Status: AC
  Administered 2017-07-27: 2 g via INTRAVENOUS

## 2017-07-27 MED ORDER — METOCLOPRAMIDE HCL 5 MG/ML IJ SOLN: 5.0000 mg | Freq: Three times a day (TID) | INTRAMUSCULAR | Status: DC | PRN

## 2017-07-27 MED ORDER — EPHEDRINE SULFATE 50 MG/ML IJ SOLN
INTRAMUSCULAR | Status: DC | PRN
  Administered 2017-07-27 (×3): 5 mg via INTRAVENOUS

## 2017-07-27 MED ORDER — OXYCODONE HCL 5 MG PO TABS
10.0000 mg | ORAL_TABLET | ORAL | Status: DC | PRN
  Administered 2017-07-27: 10 mg via ORAL
  Administered 2017-07-27: 5 mg via ORAL
  Administered 2017-07-28 (×2): 10 mg via ORAL
  Filled 2017-07-27 (×3): qty 2

## 2017-07-27 MED ORDER — METHOCARBAMOL 500 MG PO TABS
500.0000 mg | ORAL_TABLET | Freq: Four times a day (QID) | ORAL | Status: DC | PRN
  Administered 2017-07-27 – 2017-07-28 (×5): 500 mg via ORAL
  Filled 2017-07-27 (×4): qty 1

## 2017-07-27 MED ORDER — DEXAMETHASONE SODIUM PHOSPHATE 10 MG/ML IJ SOLN
INTRAMUSCULAR | Status: AC
Start: 2017-07-27 — End: 2017-07-27
  Filled 2017-07-27: qty 2

## 2017-07-27 MED ORDER — 0.9 % SODIUM CHLORIDE (POUR BTL) OPTIME
TOPICAL | Status: DC | PRN
  Administered 2017-07-27: 1000 mL

## 2017-07-27 MED ORDER — MIDAZOLAM HCL 2 MG/2ML IJ SOLN
INTRAMUSCULAR | Status: AC
  Filled 2017-07-27: qty 2

## 2017-07-27 MED ORDER — ONDANSETRON HCL 4 MG PO TABS: 4.0000 mg | ORAL_TABLET | Freq: Four times a day (QID) | ORAL | Status: DC | PRN

## 2017-07-27 MED ORDER — LIDOCAINE 2% (20 MG/ML) 5 ML SYRINGE
INTRAMUSCULAR | Status: AC
  Filled 2017-07-27: qty 10

## 2017-07-27 MED ORDER — FLUOXETINE HCL 20 MG PO CAPS
40.0000 mg | ORAL_CAPSULE | Freq: Every day | ORAL | Status: DC
  Administered 2017-07-28 – 2017-07-29 (×2): 40 mg via ORAL
  Filled 2017-07-27 (×2): qty 2

## 2017-07-27 MED ORDER — PHENYLEPHRINE HCL 10 MG/ML IJ SOLN
INTRAMUSCULAR | Status: DC | PRN
  Administered 2017-07-27 (×2): 120 ug via INTRAVENOUS

## 2017-07-27 MED ORDER — HYDROCODONE-ACETAMINOPHEN 5-325 MG PO TABS
1.0000 | ORAL_TABLET | ORAL | Status: DC | PRN
  Administered 2017-07-28 (×3): 2 via ORAL
  Administered 2017-07-29: 1 via ORAL
  Administered 2017-07-29 (×2): 2 via ORAL
  Filled 2017-07-27 (×6): qty 2
  Filled 2017-07-27: qty 1

## 2017-07-27 MED ORDER — PROMETHAZINE HCL 25 MG/ML IJ SOLN: 6.2500 mg | INTRAMUSCULAR | Status: DC | PRN

## 2017-07-27 MED ORDER — OXYCODONE HCL 5 MG PO TABS
ORAL_TABLET | ORAL | Status: AC
  Filled 2017-07-27: qty 1

## 2017-07-27 MED ORDER — FENTANYL CITRATE (PF) 100 MCG/2ML IJ SOLN
INTRAMUSCULAR | Status: AC
  Administered 2017-07-27: 75 ug via INTRAVENOUS
  Filled 2017-07-27: qty 2

## 2017-07-27 MED ORDER — PROPOFOL 10 MG/ML IV BOLUS
INTRAVENOUS | Status: DC | PRN
  Administered 2017-07-27: 120 mg via INTRAVENOUS

## 2017-07-27 MED ORDER — MIDAZOLAM HCL 2 MG/2ML IJ SOLN
1.0000 mg | Freq: Once | INTRAMUSCULAR | Status: AC
  Administered 2017-07-27: 1 mg via INTRAVENOUS
  Filled 2017-07-27: qty 1

## 2017-07-27 MED ORDER — CEFAZOLIN SODIUM-DEXTROSE 1-4 GM/50ML-% IV SOLN
1.0000 g | Freq: Four times a day (QID) | INTRAVENOUS | Status: AC
  Administered 2017-07-27 – 2017-07-28 (×3): 1 g via INTRAVENOUS
  Filled 2017-07-27 (×3): qty 50

## 2017-07-27 MED ORDER — HYDROMORPHONE HCL 1 MG/ML IJ SOLN
INTRAMUSCULAR | Status: AC
  Filled 2017-07-27: qty 1

## 2017-07-27 MED ORDER — ACETAMINOPHEN 650 MG RE SUPP: 650.0000 mg | RECTAL | Status: DC | PRN

## 2017-07-27 MED ORDER — FENTANYL CITRATE (PF) 250 MCG/5ML IJ SOLN
INTRAMUSCULAR | Status: DC | PRN
  Administered 2017-07-27: 25 ug via INTRAVENOUS
  Administered 2017-07-27 (×3): 50 ug via INTRAVENOUS
  Administered 2017-07-27 (×3): 25 ug via INTRAVENOUS

## 2017-07-27 MED ORDER — METHOCARBAMOL 500 MG PO TABS
ORAL_TABLET | ORAL | Status: AC
  Filled 2017-07-27: qty 1

## 2017-07-27 MED ORDER — LACTATED RINGERS IV SOLN
INTRAVENOUS | Status: DC | PRN
  Administered 2017-07-27 (×2): via INTRAVENOUS

## 2017-07-27 MED ORDER — ONDANSETRON HCL 4 MG/2ML IJ SOLN
INTRAMUSCULAR | Status: DC | PRN
  Administered 2017-07-27: 4 mg via INTRAVENOUS

## 2017-07-27 MED ORDER — MIDAZOLAM HCL 2 MG/2ML IJ SOLN
INTRAMUSCULAR | Status: AC
  Administered 2017-07-27: 1 mg via INTRAVENOUS
  Filled 2017-07-27: qty 2

## 2017-07-27 MED ORDER — BUPIVACAINE HCL (PF) 0.25 % IJ SOLN
INTRAMUSCULAR | Status: AC
  Filled 2017-07-27: qty 30

## 2017-07-27 MED ORDER — LIDOCAINE 2% (20 MG/ML) 5 ML SYRINGE
INTRAMUSCULAR | Status: DC | PRN
  Administered 2017-07-27: 100 mg via INTRAVENOUS

## 2017-07-27 MED ORDER — ACETAMINOPHEN 325 MG PO TABS
650.0000 mg | ORAL_TABLET | ORAL | Status: DC | PRN
Start: 2017-07-27 — End: 2017-07-29
  Administered 2017-07-28: 650 mg via ORAL
  Filled 2017-07-27: qty 2

## 2017-07-27 MED ORDER — FENTANYL CITRATE (PF) 250 MCG/5ML IJ SOLN
INTRAMUSCULAR | Status: AC
  Filled 2017-07-27: qty 5

## 2017-07-27 MED ORDER — PRAVASTATIN SODIUM 20 MG PO TABS
20.0000 mg | ORAL_TABLET | Freq: Every evening | ORAL | Status: DC
  Administered 2017-07-27 – 2017-07-28 (×2): 20 mg via ORAL
  Filled 2017-07-27 (×2): qty 1

## 2017-07-27 MED ORDER — MIDAZOLAM HCL 2 MG/2ML IJ SOLN
INTRAMUSCULAR | Status: DC | PRN
  Administered 2017-07-27: 1 mg via INTRAVENOUS

## 2017-07-27 MED ORDER — TRAZODONE HCL 50 MG PO TABS
50.0000 mg | ORAL_TABLET | Freq: Every evening | ORAL | Status: DC | PRN
  Administered 2017-07-28: 50 mg via ORAL
  Filled 2017-07-27: qty 1

## 2017-07-27 MED ORDER — VITAMIN D 1000 UNITS PO TABS
1000.0000 [IU] | ORAL_TABLET | Freq: Every day | ORAL | Status: DC
  Administered 2017-07-27 – 2017-07-29 (×3): 1000 [IU] via ORAL
  Filled 2017-07-27 (×2): qty 1

## 2017-07-27 MED ORDER — CHLORHEXIDINE GLUCONATE 4 % EX LIQD: 60.0000 mL | Freq: Once | CUTANEOUS | Status: DC

## 2017-07-27 MED ORDER — METOPROLOL SUCCINATE ER 25 MG PO TB24
12.5000 mg | ORAL_TABLET | Freq: Every day | ORAL | Status: DC
  Administered 2017-07-28: 12.5 mg via ORAL
  Filled 2017-07-27 (×2): qty 1

## 2017-07-27 MED ORDER — DEXTROSE 5 % IV SOLN
500.0000 mg | Freq: Four times a day (QID) | INTRAVENOUS | Status: DC | PRN
  Filled 2017-07-27: qty 5

## 2017-07-27 MED ORDER — METOCLOPRAMIDE HCL 5 MG PO TABS: 5.0000 mg | ORAL_TABLET | Freq: Three times a day (TID) | ORAL | Status: DC | PRN

## 2017-07-27 MED ORDER — DEXAMETHASONE SODIUM PHOSPHATE 10 MG/ML IJ SOLN
INTRAMUSCULAR | Status: DC | PRN
  Administered 2017-07-27: 10 mg via INTRAVENOUS

## 2017-07-27 MED ORDER — LACTATED RINGERS IV SOLN
INTRAVENOUS | Status: DC
  Administered 2017-07-27: 13:00:00 via INTRAVENOUS

## 2017-07-27 MED ORDER — ADULT MULTIVITAMIN W/MINERALS CH
1.0000 | ORAL_TABLET | Freq: Every day | ORAL | Status: DC
  Administered 2017-07-27 – 2017-07-29 (×3): 1 via ORAL
  Filled 2017-07-27 (×3): qty 1

## 2017-07-27 MED ORDER — ONDANSETRON HCL 4 MG/2ML IJ SOLN: 4.0000 mg | Freq: Four times a day (QID) | INTRAMUSCULAR | Status: DC | PRN

## 2017-07-27 MED ORDER — ALPRAZOLAM 0.5 MG PO TABS
0.5000 mg | ORAL_TABLET | Freq: Two times a day (BID) | ORAL | Status: DC
  Administered 2017-07-27 – 2017-07-29 (×4): 0.5 mg via ORAL
  Filled 2017-07-27 (×4): qty 1

## 2017-07-27 MED ORDER — ONDANSETRON HCL 4 MG/2ML IJ SOLN
INTRAMUSCULAR | Status: AC
  Filled 2017-07-27: qty 4

## 2017-07-27 SURGICAL SUPPLY — 87 items
BANDAGE ACE 4X5 VEL STRL LF (GAUZE/BANDAGES/DRESSINGS) ×2 IMPLANT
BANDAGE ACE 6X5 VEL STRL LF (GAUZE/BANDAGES/DRESSINGS) ×2 IMPLANT
BANDAGE ESMARK 6X9 LF (GAUZE/BANDAGES/DRESSINGS) IMPLANT
BIT DRILL CALIBRATED 4.2 (BIT) IMPLANT
BIT DRILL SHORT 4.2 (BIT) IMPLANT
BLADE 15 SAFETY STRL DISP (BLADE) ×2 IMPLANT
BNDG CMPR 9X6 STRL LF SNTH (GAUZE/BANDAGES/DRESSINGS)
BNDG COHESIVE 4X5 TAN STRL (GAUZE/BANDAGES/DRESSINGS) IMPLANT
BNDG ESMARK 6X9 LF (GAUZE/BANDAGES/DRESSINGS)
BNDG GAUZE ELAST 4 BULKY (GAUZE/BANDAGES/DRESSINGS) ×3 IMPLANT
BONE CANC CHIPS 40CC CAN1/2 (Bone Implant) ×3 IMPLANT
CHIPS CANC BONE 40CC CAN1/2 (Bone Implant) ×1 IMPLANT
CLEANER TIP ELECTROSURG 2X2 (MISCELLANEOUS) ×2 IMPLANT
COVER SURGICAL LIGHT HANDLE (MISCELLANEOUS) ×3 IMPLANT
CUFF TOURNIQUET SINGLE 34IN LL (TOURNIQUET CUFF) IMPLANT
CUFF TOURNIQUET SINGLE 44IN (TOURNIQUET CUFF) IMPLANT
DRAPE C-ARM 42X72 X-RAY (DRAPES) ×3 IMPLANT
DRAPE C-ARMOR (DRAPES) ×2 IMPLANT
DRAPE HALF SHEET 40X57 (DRAPES) IMPLANT
DRAPE INCISE IOBAN 66X45 STRL (DRAPES) IMPLANT
DRAPE ORTHO SPLIT 77X108 STRL (DRAPES) ×6
DRAPE SURG ORHT 6 SPLT 77X108 (DRAPES) IMPLANT
DRAPE U-SHAPE 47X51 STRL (DRAPES) ×3 IMPLANT
DRILL BIT CALIBRATED 4.2 (BIT) ×3
DRILL BIT SHORT 4.2 (BIT) ×3
DRSG EMULSION OIL 3X3 NADH (GAUZE/BANDAGES/DRESSINGS) ×3 IMPLANT
DRSG PAD ABDOMINAL 8X10 ST (GAUZE/BANDAGES/DRESSINGS) ×5 IMPLANT
DURAPREP 26ML APPLICATOR (WOUND CARE) ×3 IMPLANT
ELECT REM PT RETURN 9FT ADLT (ELECTROSURGICAL) ×3
ELECTRODE REM PT RTRN 9FT ADLT (ELECTROSURGICAL) ×1 IMPLANT
GAUZE SPONGE 4X4 12PLY STRL (GAUZE/BANDAGES/DRESSINGS) ×5 IMPLANT
GAUZE SPONGE 4X4 12PLY STRL LF (GAUZE/BANDAGES/DRESSINGS) ×2 IMPLANT
GAUZE XEROFORM 1X8 LF (GAUZE/BANDAGES/DRESSINGS) ×2 IMPLANT
GAUZE XEROFORM 5X9 LF (GAUZE/BANDAGES/DRESSINGS) ×3 IMPLANT
GLOVE BIO SURGEON STRL SZ8 (GLOVE) ×3 IMPLANT
GLOVE BIOGEL PI IND STRL 8 (GLOVE) ×2 IMPLANT
GLOVE BIOGEL PI INDICATOR 8 (GLOVE) ×4
GLOVE ORTHO TXT STRL SZ7.5 (GLOVE) ×3 IMPLANT
GOWN STRL REUS W/ TWL LRG LVL3 (GOWN DISPOSABLE) ×3 IMPLANT
GOWN STRL REUS W/ TWL XL LVL3 (GOWN DISPOSABLE) ×4 IMPLANT
GOWN STRL REUS W/TWL LRG LVL3 (GOWN DISPOSABLE) ×9
GOWN STRL REUS W/TWL XL LVL3 (GOWN DISPOSABLE) ×12
GRAFT BNE CHIP CANC 1-8 40 (Bone Implant) IMPLANT
GUIDEWIRE 3.2X400 (WIRE) ×2 IMPLANT
KIT BASIN OR (CUSTOM PROCEDURE TRAY) ×3 IMPLANT
KIT ROOM TURNOVER OR (KITS) ×3 IMPLANT
MANIFOLD NEPTUNE II (INSTRUMENTS) ×3 IMPLANT
NAIL TIB 11X300 (Nail) ×2 IMPLANT
NDL HYPO 25GX1X1/2 BEV (NEEDLE) IMPLANT
NEEDLE HYPO 25GX1X1/2 BEV (NEEDLE) ×3 IMPLANT
NS IRRIG 1000ML POUR BTL (IV SOLUTION) ×3 IMPLANT
PACK GENERAL/GYN (CUSTOM PROCEDURE TRAY) ×3 IMPLANT
PACK ORTHO EXTREMITY (CUSTOM PROCEDURE TRAY) ×3 IMPLANT
PAD ABD 8X10 STRL (GAUZE/BANDAGES/DRESSINGS) ×2 IMPLANT
PAD ARMBOARD 7.5X6 YLW CONV (MISCELLANEOUS) ×6 IMPLANT
PAD CAST 4YDX4 CTTN HI CHSV (CAST SUPPLIES) ×2 IMPLANT
PADDING CAST COTTON 4X4 STRL (CAST SUPPLIES) ×3
PADDING CAST COTTON 6X4 STRL (CAST SUPPLIES) ×3 IMPLANT
PREFILTER NEPTUNE (MISCELLANEOUS) ×3 IMPLANT
PUTTY DBX 10CC (Bone Implant) ×2 IMPLANT
REAMER ROD DEEP FLUTE 2.5X950 (INSTRUMENTS) ×2 IMPLANT
SCREW LOCK STAR 5X30 (Screw) ×2 IMPLANT
SCREW LOCK STAR 5X32 (Screw) ×2 IMPLANT
SCREW LOCK STAR 5X34 (Screw) ×4 IMPLANT
SCREW LOCK STAR 5X44 (Screw) ×2 IMPLANT
SCREW LOCK STAR 5X46 (Screw) ×2 IMPLANT
SPONGE LAP 18X18 X RAY DECT (DISPOSABLE) ×2 IMPLANT
SPONGE LAP 4X18 X RAY DECT (DISPOSABLE) ×6 IMPLANT
STAPLER VISISTAT 35W (STAPLE) ×3 IMPLANT
STOCKINETTE IMPERVIOUS 9X36 MD (GAUZE/BANDAGES/DRESSINGS) ×2 IMPLANT
SUCTION FRAZIER HANDLE 10FR (MISCELLANEOUS) ×2
SUCTION TUBE FRAZIER 10FR DISP (MISCELLANEOUS) ×1 IMPLANT
SUT ETHILON 2 0 FS 18 (SUTURE) ×13 IMPLANT
SUT ETHILON 4 0 FS 1 (SUTURE) IMPLANT
SUT VIC AB 0 CT1 27 (SUTURE) ×6
SUT VIC AB 0 CT1 27XBRD ANBCTR (SUTURE) ×1 IMPLANT
SUT VIC AB 1 CT1 27 (SUTURE) ×3
SUT VIC AB 1 CT1 27XBRD ANBCTR (SUTURE) IMPLANT
SUT VIC AB 2-0 CT1 27 (SUTURE) ×9
SUT VIC AB 2-0 CT1 TAPERPNT 27 (SUTURE) ×1 IMPLANT
SYR CONTROL 10ML LL (SYRINGE) ×2 IMPLANT
TOWEL OR 17X24 6PK STRL BLUE (TOWEL DISPOSABLE) ×3 IMPLANT
TOWEL OR 17X26 10 PK STRL BLUE (TOWEL DISPOSABLE) ×3 IMPLANT
TUBE CONNECTING 12'X1/4 (SUCTIONS) ×1
TUBE CONNECTING 12X1/4 (SUCTIONS) ×2 IMPLANT
WATER STERILE IRR 1000ML POUR (IV SOLUTION) ×3 IMPLANT
YANKAUER SUCT BULB TIP NO VENT (SUCTIONS) ×2 IMPLANT

## 2017-07-27 NOTE — Anesthesia Procedure Notes (Addendum)
Anesthesia Regional Block: Popliteal block   Pre-Anesthetic Checklist: ,, timeout performed, Correct Patient, Correct Site, Correct Laterality, Correct Procedure, Correct Position, site marked, Risks and benefits discussed,  Surgical consent,  Pre-op evaluation,  At surgeon's request and post-op pain management  Laterality: Left and Lower  Prep: chloraprep       Needles:   Needle Type: Echogenic Stimulator Needle     Needle Length: 9cm  Needle Gauge: 21   Needle insertion depth: 5 cm   Additional Needles:   Procedures:,,,, ultrasound used (permanent image in chart),,,,  Narrative:  Start time: 07/27/2017 1:45 PM End time: 07/27/2017 2:01 PM Injection made incrementally with aspirations every 5 mL.  Performed by: Personally  Anesthesiologist: Rica Koyanagi, MD

## 2017-07-27 NOTE — Progress Notes (Signed)
Pt admitted from PACU to the floor. Denies pain at this time. Pt a&ox4. Husband at bedside. VSS. BP (!) 113/58 (BP Location: Left Arm)   Pulse 83   Temp 98 F (36.7 C)   Resp 14   Ht 5\' 2"  (1.575 m)   Wt 65.8 kg (145 lb)   SpO2 97%   BMI 26.52 kg/m

## 2017-07-27 NOTE — Brief Op Note (Signed)
07/27/2017  5:26 PM  PATIENT:  [name]  72 y.o. female  PRE-OPERATIVE DIAGNOSIS:  left tibia fracture non-union  POST-OPERATIVE DIAGNOSIS:  left tibia fracture non-union  PROCEDURE:  Procedure(s): Hardware removal left ankle, takedown non-union with allograft bone graft, Intramedullary Nail (Left) HARDWARE REMOVAL (Left)  SURGEON:  Surgeon(s) and Role:    Mcarthur Rossetti, MD - Primary  PHYSICIAN ASSISTANT: Benita Stabile, PA-C  ANESTHESIA:   regional and general  EBL:  100 mL   COUNTS:  YES  TOURNIQUET:   Total Tourniquet Time Documented: Thigh (Left) - 120 minutes Total: Thigh (Left) - 120 minutes   DICTATION: .Other Dictation: Dictation Number [id number]  PLAN OF CARE: Admit to inpatient   PATIENT DISPOSITION:  PACU - hemodynamically stable.   Delay start of Pharmacological VTE agent (>24hrs) due to surgical blood loss or risk of bleeding: no

## 2017-07-27 NOTE — Transfer of Care (Signed)
Immediate Anesthesia Transfer of Care Note  Patient: [name]  Procedure(s) Performed: Hardware removal left ankle, takedown non-union with allograft bone graft, Intramedullary Nail (Left Ankle) HARDWARE REMOVAL (Left Ankle)  Patient Location: PACU  Anesthesia Type:General  Level of Consciousness: awake, alert  and oriented  Airway & Oxygen Therapy: Patient Spontanous Breathing and Patient connected to nasal cannula oxygen  Post-op Assessment: Report given to RN and Post -op Vital signs reviewed and stable  Post vital signs: Reviewed and stable  Last Vitals:  Vitals:   07/27/17 1400 07/27/17 1405  BP: 129/60 (!) 112/50  Pulse: 66 70  Resp: 15 17  Temp:    SpO2: 100% 98%    Last Pain:  Vitals:   07/27/17 1355  TempSrc:   PainSc: 6       Patients Stated Pain Goal: 3 (10/93/23 5573)  Complications: No apparent anesthesia complications

## 2017-07-27 NOTE — H&P (Signed)
[name] is an 72 y.o. female.   Chief Complaint: Left leg pain with previous open fracture HPI: Patient is a 72 year old female who in August 2018 sustained an open right distal tibia and fibula fracture.  She was taken to the operating room by 1 of my colleagues in open reduction-internal fixation was performed with plating of the tibia and fibula.  Over time her pain was decreasing and the allowed weightbearing as tolerated in a Cam walker.  She then started developing worsening pain in her left operative leg and came to see me for further relation treatment.  X-rays in my office showed failure of her hardware with breaking of the tibial plate and obvious incomplete union of the fracture of the tibia.  There is also new varus malalignment at the fracture site showing a nonunion.  Even the fibular plate is pulling off the distal fibula.  At this point we recommended revision surgery given the failure of fixation and a persistent nonunion of the fracture.  Past Medical History:  Diagnosis Date  . Anxiety   . Arthritis    "back, neck" (03/08/2017)  . Asthma in child   . Bowel obstruction ([place])   . CAD (coronary artery disease), native coronary artery   . Chronic lower back pain   . Chronic neck pain   . Colon polyps   . Complication of anesthesia    "I was awake during one of my surgeries" reports that she felt them start the incision and heard them talking reports happened ~ 35 years ago    . Depression   . Diverticulosis   . Hyperlipidemia   . Irritable bowel syndrome   . Migraine    "used to be severe; now maybe 1/year or 2" (03/08/2017)  . Pneumonia    H!N!    Past Surgical History:  Procedure Laterality Date  . APPENDECTOMY    . AUGMENTATION MAMMAPLASTY Bilateral   . BRACHIOPLASTY Bilateral    Arm Lift   . CARDIAC CATHETERIZATION    . CHOLECYSTECTOMY OPEN    . COLON SURGERY    . EXTERNAL FIXATION LEG Left 03/08/2017   Procedure: INTERNAL FIXATION LEFT ANKLE;  Surgeon:  Newt Minion, MD;  Location: [facility];  Service: Orthopedics;  Laterality: Left;  . EYE MUSCLE SURGERY Bilateral X 2   "lazy eye; OR twice on each eye; Dr. Annamaria Boots"  . FRACTURE SURGERY    . LEFT HEART CATH AND CORONARY ANGIOGRAPHY N/A 10/27/2016   Procedure: Left Heart Cath and Coronary Angiography;  Surgeon: Peter M Martinique, MD;  Location: [facility];  Service: Cardiovascular;  Laterality: N/A;  . LEFT HEART CATHETERIZATION WITH CORONARY ANGIOGRAM N/A 08/16/2014   Procedure: LEFT HEART CATHETERIZATION WITH CORONARY ANGIOGRAM;  Surgeon: Sinclair Grooms, MD;  Location: [facility];  Service: Cardiovascular;  Laterality: N/A;  . NASAL SINUS SURGERY    . ORIF ANKLE FRACTURE Left 03/08/2017  . PARTIAL COLECTOMY  1970s  . TONSILLECTOMY      Family History  Problem Relation Age of Onset  . Heart disease Mother        CHF  . Coronary artery disease Mother   . Hypertension Mother   . Hyperlipidemia Mother   . Heart disease Father        CHF  . Coronary artery disease Father   . Diabetes Sister        with all the complications  . Coronary artery disease Sister   . Stroke  Sister 58       CVA  . Peripheral vascular disease Sister   . Colon cancer Maternal Grandfather   . Cancer Neg Hx        breast or colon   Social History:  reports that she quit smoking about 7 years ago. Her smoking use included cigarettes. She has a 41.00 pack-year smoking history. she has never used smokeless tobacco. She reports that she does not drink alcohol or use drugs.  Allergies:  Allergies  Allergen Reactions  . Shellfish-Derived Products Anaphylaxis, Shortness Of Breath and Swelling    Throat swells; Iodine is ok  . Lipitor [Atorvastatin] Nausea Only and Other (See Comments)    Fatigue and Lethargy. Leg, back,Joint, and muscle pain. Per patient "My stool was a muddy clay color and I having diarrhea" pt states "I never want to take another statin again"  . Crestor [Rosuvastatin Calcium]      UNSPECIFIED REACTION   . Codeine Nausea And Vomiting    Medications Prior to Admission  Medication Sig Dispense Refill  . ALPRAZolam (XANAX) 0.5 MG tablet Take 0.5 mg by mouth 2 (two) times daily.     . Calcium Carbonate (CALCIUM 600 PO) Take 1 tablet by mouth daily.    . cholecalciferol (VITAMIN D) 1000 units tablet Take 1,000 Units by mouth daily.    . diclofenac (VOLTAREN) 75 MG EC tablet Take 75 mg by mouth 2 (two) times daily as needed for mild pain.    Marland Kitchen FLUoxetine (PROZAC) 40 MG capsule TAKE ONE CAPSULE BY MOUTH ONCE DAILY (Patient taking differently: TAKE 40 MG BY MOUTH ONCE DAILY) 30 capsule 0  . metoprolol succinate (TOPROL-XL) 25 MG 24 hr tablet Take 12.5 mg by mouth daily.    . Multiple Vitamins-Minerals (MULTIVITAMIN WITH MINERALS) tablet Take 1 tablet by mouth daily.    . pravastatin (PRAVACHOL) 20 MG tablet Take 1 tablet (20 mg total) by mouth every evening. 30 tablet 11  . traZODone (DESYREL) 50 MG tablet TAKE 1 TABLET BY MOUTH AT BEDTIME (QUALITEST BRAND ONLY [phone]) (Patient taking differently: TAKE 50 MG BY MOUTH AT BEDTIME (QUALITEST BRAND ONLY [phone])) 30 tablet 5    No results found for this or any previous visit (from the past 48 hour(s)). No results found.  Review of Systems  All other systems reviewed and are negative.   There were no vitals taken for this visit. Physical Exam  Constitutional: She is oriented to person, place, and time. She appears well-developed and well-nourished.  HENT:  Head: Normocephalic and atraumatic.  Eyes: EOM are normal. Pupils are equal, round, and reactive to light.  Neck: Normal range of motion. Neck supple.  Cardiovascular: Normal rate and regular rhythm.  Respiratory: Effort normal and breath sounds normal.  GI: Soft. Bowel sounds are normal.  Musculoskeletal:       Left ankle: She exhibits deformity.  Neurological: She is alert and oriented to person, place, and time.  Skin: Skin is warm and dry.   Psychiatric: She has a normal mood and affect.    The skin around her left tibia is normal-appearing and is healed from her previous surgery.  There is no redness of the tissue and no drainage.  There is no induration.  Her foot is well-perfused with normal sensation.  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  We will then likely perform local bone grafting with allograft bone grafting and revision fixation with either an intramedullary tibial nail if possible versus medial plating of the distal tibia fracture.  She understands this fully.  She understands we will assess for infection as well.  We a long and thorough discussion about the risks and benefits of the surgery as well and she does wish to proceed.  Mcarthur Rossetti, MD 07/27/2017, 12:14 PM

## 2017-07-27 NOTE — Anesthesia Preprocedure Evaluation (Addendum)
Anesthesia Evaluation  Patient identified by MRN, date of birth, ID band Patient awake    Reviewed: Allergy & Precautions, NPO status , Patient's Chart, lab work & pertinent test results  Airway Mallampati: II  TM Distance: >3 FB Neck ROM: Full    Dental  (+) Teeth Intact   Pulmonary asthma , former smoker,    breath sounds clear to auscultation       Cardiovascular + CAD   Rhythm:Regular Rate:Normal  Minimal CAD   Neuro/Psych  Headaches,    GI/Hepatic negative GI ROS, Neg liver ROS,   Endo/Other  negative endocrine ROS  Renal/GU negative Renal ROS     Musculoskeletal  (+) Arthritis ,   Abdominal   Peds  Hematology negative hematology ROS (+)   Anesthesia Other Findings   Reproductive/Obstetrics                            Anesthesia Physical Anesthesia Plan  ASA: III  Anesthesia Plan: General   Post-op Pain Management:  Regional for Post-op pain   Induction: Intravenous  PONV Risk Score and Plan: 4 or greater and Treatment may vary due to age or medical condition, Dexamethasone and Ondansetron  Airway Management Planned: LMA  Additional Equipment:   Intra-op Plan:   Post-operative Plan: Extubation in OR  Informed Consent: I have reviewed the patients History and Physical, chart, labs and discussed the procedure including the risks, benefits and alternatives for the proposed anesthesia with the patient or authorized representative who has indicated his/her understanding and acceptance.   Dental advisory given  Plan Discussed with:   Anesthesia Plan Comments:         Anesthesia Quick Evaluation

## 2017-07-27 NOTE — Anesthesia Postprocedure Evaluation (Signed)
Anesthesia Post Note  Patient: [name]  Procedure(s) Performed: Hardware removal left ankle, takedown non-union with allograft bone graft, Intramedullary Nail (Left Ankle) HARDWARE REMOVAL (Left Ankle)     Patient location during evaluation: PACU Anesthesia Type: General Level of consciousness: awake and alert Pain management: pain level controlled Vital Signs Assessment: post-procedure vital signs reviewed and stable Respiratory status: spontaneous breathing, nonlabored ventilation, respiratory function stable and patient connected to nasal cannula oxygen Cardiovascular status: blood pressure returned to baseline and stable Postop Assessment: no apparent nausea or vomiting Anesthetic complications: no    Last Vitals:  Vitals:   07/27/17 1840 07/27/17 1950  BP: (!) 113/58 (!) 104/52  Pulse:  78  Resp: 14 14  Temp: 36.7 C   SpO2: 97% 98%    Last Pain:  Vitals:   07/27/17 1830  TempSrc:   PainSc: Asleep                 Jaxxen Voong COKER

## 2017-07-27 NOTE — Progress Notes (Signed)
Vital signs taken on arrival to pacu

## 2017-07-27 NOTE — Anesthesia Procedure Notes (Signed)
Procedure Name: LMA Insertion Date/Time: 07/27/2017 2:34 PM Performed by: Bryson Corona, CRNA Pre-anesthesia Checklist: Patient identified, Emergency Drugs available, Suction available and Patient being monitored Patient Re-evaluated:Patient Re-evaluated prior to induction Oxygen Delivery Method: Circle System Utilized Preoxygenation: Pre-oxygenation with 100% oxygen Induction Type: IV induction Ventilation: Mask ventilation without difficulty LMA: LMA inserted LMA Size: 4.0 Number of attempts: 1 Placement Confirmation: positive ETCO2 Tube secured with: Tape Dental Injury: Teeth and Oropharynx as per pre-operative assessment

## 2017-07-28 ENCOUNTER — Other Ambulatory Visit: Payer: Self-pay

## 2017-07-28 ENCOUNTER — Encounter (HOSPITAL_COMMUNITY): Payer: Self-pay | Admitting: General Practice

## 2017-07-28 MED ORDER — ASPIRIN 81 MG PO CHEW
81.0000 mg | CHEWABLE_TABLET | Freq: Two times a day (BID) | ORAL | Status: DC
  Administered 2017-07-28 – 2017-07-29 (×2): 81 mg via ORAL
  Filled 2017-07-28 (×2): qty 1

## 2017-07-28 NOTE — Progress Notes (Signed)
Orthopedic Tech Progress Note Patient Details:  [name] 11/21/1945 [id number]  Ortho Devices Type of Ortho Device: CAM walker Ortho Device/Splint Location: lle Ortho Device/Splint Interventions: Application   Post Interventions Patient Tolerated: Well Instructions Provided: Care of device   Hildred Priest 07/28/2017, 10:03 AM

## 2017-07-28 NOTE — Evaluation (Signed)
Physical Therapy Evaluation Patient Details Name: [name] MRN: [id number] DOB: March 31, 1946 Today's Date: 07/28/2017   History of Present Illness  Pt is a 72 y/o female who presents with L tibia fracture non-union. She is now s/p IM nail of L ankle. She is NWB in a CAM boot.   Clinical Impression  Pt admitted with above diagnosis. Pt currently with functional limitations due to the deficits listed below (see PT Problem List). At the time of PT eval pt was able to perform transfers and ambulation with gross min assist for balance support, walker management, and safety. Pt donned CAM boot with some assist from therapist and pt/husband were educated on proper fit. Pt and husband both state they are comfortable managing home mobility and ADL's without home health therapy follow-up. Will defer post-acute therapy to MD's discretion when appropriate per post-op protocol. Pt will benefit from skilled PT to increase their independence and safety with mobility to allow discharge to the venue listed below.       Follow Up Recommendations No PT follow up;Supervision for mobility/OOB(PT to begin per post-op protocol - MD will refer)    Equipment Recommendations  Rolling walker with 5" wheels(youth size)    Recommendations for Other Services       Precautions / Restrictions Precautions Precautions: Fall Required Braces or Orthoses: Other Brace/Splint Other Brace/Splint: CAM boot Restrictions Weight Bearing Restrictions: Yes LLE Weight Bearing: Non weight bearing      Mobility  Bed Mobility Overal bed mobility: Needs Assistance Bed Mobility: Supine to Sit     Supine to sit: Min assist     General bed mobility comments: Pt holding LLE as she advances off the bed. When placing hands on the bed to advance hips forward, therapist supported LLE for pain control until able to be rested on the floor.   Transfers Overall transfer level: Needs assistance Equipment used: Rolling walker (2  wheeled) Transfers: Sit to/from Stand Sit to Stand: Min assist         General transfer comment: Min assist to power-up to full standing position. Pt was able to maintain NWB status well.   Ambulation/Gait Ambulation/Gait assistance: Min assist Ambulation Distance (Feet): 15 Feet Assistive device: Rolling walker (2 wheeled) Gait Pattern/deviations: Step-to pattern;Decreased stride length;Trunk flexed Gait velocity: Decreased Gait velocity interpretation: Below normal speed for age/gender General Gait Details: VC's for sequencing/safety with RW. Occasional assist for walker management  Stairs            Wheelchair Mobility    Modified Rankin (Stroke Patients Only)       Balance Overall balance assessment: Needs assistance Sitting-balance support: Feet supported;No upper extremity supported Sitting balance-Leahy Scale: Good     Standing balance support: Bilateral upper extremity supported;During functional activity Standing balance-Leahy Scale: Poor Standing balance comment: Reliant on UE support due to WB status                             Pertinent Vitals/Pain Pain Assessment: Faces Faces Pain Scale: Hurts even more Pain Location: L ankle into knee Pain Descriptors / Indicators: Operative site guarding Pain Intervention(s): Limited activity within patient's tolerance;Monitored during session;Repositioned    Home Living Family/patient expects to be discharged to:: Private residence Living Arrangements: Spouse/significant other Available Help at Discharge: Family;Available 24 hours/day Type of Home: House Home Access: Stairs to enter   [employer] of Steps: 3 in front, 4 in back Home Layout: Two level;Able to  live on main level with bedroom/bathroom Home Equipment: Shower seat - built in;Grab bars - tub/shower;Grab bars - toilet;Walker - 2 wheels;Bedside commode      Prior Function Level of Independence: Independent with assistive  device(s)         Comments: For the past 6-8 weeks has been able to get into the tub with husband's assistance, and once in can bathe herself. Dressing herself with occasional assist from husband. Used knee scooter initially when NWB and progressed to RW when able to put more weight through the foot. Pt reports unable to use knee scooter this go-round due to placement of rod.      Hand Dominance   Dominant Hand: Right    Extremity/Trunk Assessment   Upper Extremity Assessment Upper Extremity Assessment: Overall WFL for tasks assessed    Lower Extremity Assessment Lower Extremity Assessment: LLE deficits/detail LLE Deficits / Details: Decreased strength and AROM consistent with pre-op diagnosis LLE: Unable to fully assess due to pain;Unable to fully assess due to immobilization    Cervical / Trunk Assessment Cervical / Trunk Assessment: Normal  Communication   Communication: No difficulties  Cognition Arousal/Alertness: Awake/alert Behavior During Therapy: WFL for tasks assessed/performed Overall Cognitive Status: Within Functional Limits for tasks assessed                                        General Comments      Exercises     Assessment/Plan    PT Assessment Patient needs continued PT services  PT Problem List Decreased strength;Decreased range of motion;Decreased activity tolerance;Decreased balance;Decreased mobility;Decreased knowledge of use of DME;Decreased safety awareness;Decreased knowledge of precautions;Pain       PT Treatment Interventions DME instruction;Gait training;Stair training;Functional mobility training;Therapeutic activities;Therapeutic exercise;Neuromuscular re-education;Patient/family education    PT Goals (Current goals can be found in the Care Plan section)  Acute Rehab PT Goals Patient Stated Goal: Decrease pain; improve mobility PT Goal Formulation: With patient/family Time For Goal Achievement: 08/04/17 Potential  to Achieve Goals: Good    Frequency Min 5X/week   Barriers to discharge        Co-evaluation               AM-PAC PT "6 Clicks" Daily Activity  Outcome Measure Difficulty turning over in bed (including adjusting bedclothes, sheets and blankets)?: None Difficulty moving from lying on back to sitting on the side of the bed? : A Little Difficulty sitting down on and standing up from a chair with arms (e.g., wheelchair, bedside commode, etc,.)?: A Little Help needed moving to and from a bed to chair (including a wheelchair)?: A Little Help needed walking in hospital room?: A Little Help needed climbing 3-5 steps with a railing? : A Little 6 Click Score: 19    End of Session Equipment Utilized During Treatment: Gait belt;Other (comment)(CAM boot) Activity Tolerance: Patient tolerated treatment well Patient left: in chair;with call bell/phone within reach;with family/visitor present Nurse Communication: Mobility status PT Visit Diagnosis: Pain;Unsteadiness on feet (R26.81);Difficulty in walking, not elsewhere classified (R26.2) Pain - Right/Left: Left Pain - part of body: Ankle and joints of foot    Time: 0539-7673 PT Time Calculation (min) (ACUTE ONLY): 37 min   Charges:   PT Evaluation $PT Eval Moderate Complexity: 1 Mod PT Treatments $Gait Training: 8-22 mins   PT G Codes:        [name], PT, DPT Acute  Rehabilitation Services Pager: [phone]   [name] 07/28/2017, 2:11 PM

## 2017-07-28 NOTE — Care Management Note (Signed)
Case Management Note  Patient Details  Name: [name] MRN: [id number] Date of Birth: Dec 10, 1945  Subjective/Objective:    72 yr old female  Admitted with nonunion of left tibia. Underwent Hardware removal left ankle and IM Nailingof left ankle.             Action/Plan: Case manager spoke with patient concerning discharge plan and DME. Patient was preoperatively setup with [facility], no changes. She has DME from previous surgery on ankle. Patient will have family support at discharge.    Expected Discharge Date:   pending               Expected Discharge Plan:  [place]  In-House Referral:  NA  Discharge planning Services  CM Consult  Post Acute Care Choice:  Home Health Choice offered to:  Patient  DME Arranged:  N/A(has RW and 3in1) DME Agency:  NA  HH Arranged:  PT [place] Agency:  [facility] (formerly [employer])  Status of Service:  Completed, signed off  If discussed at [employer], dates discussed:    Additional Comments:  Ninfa Meeker, RN 07/28/2017, 2:13 PM

## 2017-07-28 NOTE — Progress Notes (Signed)
Subjective: 1 Day Post-Op Procedure(s) (LRB): Hardware removal left ankle, takedown non-union with allograft bone graft, Intramedullary Nail (Left) HARDWARE REMOVAL (Left) Patient reports pain as moderate this morning, but had a more painful evening.   Objective: Vital signs in last 24 hours: Temp:  [97 F (36.1 C)-98.3 F (36.8 C)] 97.8 F (36.6 C) (01/16 0329) Pulse Rate:  [64-95] 76 (01/16 0329) Resp:  [11-22] 16 (01/16 0329) BP: (85-129)/(48-72) 120/56 (01/16 0329) SpO2:  [94 %-100 %] 94 % (01/16 0329) Weight:  [145 lb (65.8 kg)] 145 lb (65.8 kg) (01/15 1214)  Intake/Output from previous day: 01/15 0701 - 01/16 0700 In: 2512.5 [I.V.:2512.5] Out: 100 [Blood:100] Intake/Output this shift: No intake/output data recorded.  Recent Labs    07/27/17 1224  HGB 13.3   Recent Labs    07/27/17 1224  WBC 6.4  RBC 4.55  HCT 40.6  PLT 290   Recent Labs    07/27/17 1224  NA 138  K 4.1  CL 106  CO2 24  BUN 27*  CREATININE 1.04*  GLUCOSE 83  CALCIUM 9.3   No results for input(s): LABPT, INR in the last 72 hours.  Incision: dressing C/D/I Compartment soft Weak foot on exam, but may be block related.   Assessment/Plan: 1 Day Post-Op Procedure(s) (LRB): Hardware removal left ankle, takedown non-union with allograft bone graft, Intramedullary Nail (Left) HARDWARE REMOVAL (Left) Up with therapy - non-weight bearing on left leg.  Mcarthur Rossetti 07/28/2017, 8:03 AM

## 2017-07-28 NOTE — Op Note (Signed)
NAME:  [name], [name]                    ACCOUNT NO.:  MEDICAL RECORD NO.:  [id number]  LOCATION:                                 FACILITY:  PHYSICIAN:  Lind Guest. 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  PROCEDURES: 1. Removal of hardware including deep buried implant of broken plate     and screws from the left tibia and fibula. 2. Takedown of nonunion, left tibia. 3. Intramedullary nail placement, left tibia. 4. Supplemental bone grafting of a mixture of cancellous chips and DBX     putty, left distal tibia.  IMPLANTS:  Synthes 11 x 300 tibial nail with 1 proximal and 3 distal interlocking screws.  SURGEON:  Lind Guest. Ninfa Linden, MD.  ASSISTANT:  Erskine Emery, PA-C.  ANESTHESIA: 1. Left lower extremity popliteal block. 2. General.  BLOOD LOSS:  100 mL.  TOURNIQUET TIME:  2 hours.  COMPLICATIONS:  None.  INDICATIONS:  [name] is a 72 year old female with significant osteopenic bone, who sustained a left punctate open distal tibia and fibula fracture in August of last year.  She was taken to the operating room by another surgeon and underwent appropriate open reduction and internal fixation with an anterolateral tibial plate and a fibular plate.  He followed her closely in the office over the next several months and gradually advanced her weightbearing status.  By December, he was allowing her to fully weightbear using a Cam walker.  She then presented into the office  in January with the deformity of her leg and worsening pain.  X-rays did show a nonunion of her fracture with breakage of the anterolateral tibial plate.  With that being said, I recommended the family and the patient takedown of nonunion, removal of hardware, and revision of open reduction and internal fixation with hopefully intramedullary nail placement into the tibia to treat this fracture.  I do feel that it would be difficult to get healing no matter which direction we go given the severity of her osteopenic bone.  We had a long and thorough discussion about the risks and benefits of the surgery including the risks of infection and DVT and risk of further nonunion.  She understands her goals are decreasing her pain and improving her mobility and quality of life and striving for fracture healing.  PROCEDURE DESCRIPTION:  After informed consent was obtained, appropriate left leg was marked.  Anesthesia obtained a popliteal block of the left lower extremity.  She was brought to the operating room and placed supine on the operating table.  General anesthesia was then obtained.  A nonsterile tourniquet was placed around her upper left thigh and the left thigh, knee, leg and ankle and foot  were prepped and draped with DuraPrep and sterile drapes.  Time-out was called and she was identified as correct patient and correct left ankle.  We then used an Esmarch to wrap out the leg and tourniquet was inflated to 300 mm of pressure.  I then went through her old anterolateral incision and was able to dissect down to both the fibula and the tibia through this incision and found the hardware to be broken.  We removed both deep and buried implants from the fibula and the tibia and removed all plates and screws in their entirety.  We then used curettes, osteotomes, and a rongeur to take down the fracture of the fibula and the tibia.  Once we were able to reconstitute her tibial canal, we then made  incision over the knee and dissected down to the proximal tibia.  We dissected through the patellar tendon to expose the proximal tibia.  Under direct fluoroscopic guidance, we then put a temporary guide pin and used initiating reamer to open up the tibial canal.  We then placed a long guide rod all the way down into the ankle holding the fracture in its reduced position as possible.  We did note that there were significant necrotic bone and bone loss.  We then began reaming sequentially from an 8-mm reamer all the way up to a 12.5-mm reamer.  We got good chatter with that and good endosteal bleeding of the bone.  We then placed the real Synthes 11 x 300 tibial nail down the tibial canal without difficulty traversing the fracture.  We then removed the guide pin.  We then secured the intramedullary nail with 1 proximal and 3 distal interlocking screws with the proximal screw from lateral to medial, 2 of the distal screws from medial to lateral, and 1 from anterior to posterior.  Once we were able to do that, we then mixed 40 mL of cancellous bone chips and 10 mL to 20 mL of DBX demineralized bone matrix putty and placed this all around the fracture site.  We then closed all wounds deeply with 0 Vicryl followed by 2-0 Vicryl on the subcutaneous tissue, interrupted 2- 0 nylon on all skin wounds.  Well-padded sterile dressing was applied and she was awakened, extubated, and taken to the recovery room in stable condition.  All final counts were correct and there were no complications noted.  Postoperatively, we are going to have her nonweightbearing on the left lower extremity until we see adequate healing to allow advancing weightbearing.  We will also probably attempt bone stimulation as well.  She will be definitely admitted overnight and potential for 1-2 days.     Lind Guest. Ninfa Linden, M.D.     CYB/MEDQ  D:  07/27/2017  T:  07/28/2017  Job:  [id number]

## 2017-07-29 MED ORDER — METHOCARBAMOL 500 MG PO TABS: 500.0000 mg | ORAL_TABLET | Freq: Four times a day (QID) | ORAL | 0 refills | Status: DC | PRN

## 2017-07-29 MED ORDER — ASPIRIN 81 MG PO CHEW: 81.0000 mg | CHEWABLE_TABLET | Freq: Two times a day (BID) | ORAL | 0 refills | Status: DC

## 2017-07-29 MED ORDER — OXYCODONE HCL 5 MG PO TABS: 5.0000 mg | ORAL_TABLET | ORAL | 0 refills | Status: DC | PRN

## 2017-07-29 NOTE — Progress Notes (Signed)
Physical Therapy Treatment Patient Details Name: [name] MRN: [id number] DOB: [birth year] Today's Date: 07/29/2017    History of Present Illness Pt is a 72 y/o female who presents with L tibia fracture non-union. She is now s/p IM nail of L ankle. She is NWB in a CAM boot.     PT Comments    Pt progressing towards physical therapy goals. Was able to perform transfers and ambulation with gross min guard assist progressing to supervision for safety with RW. Will require stair training prior to d/c - pt was educated on safest option to negotiate stairs (with RW), as husband and friend have been carrying pt down the stairs at home. Will continue to follow and progress as able per POC.    Follow Up Recommendations  No PT follow up;Supervision for mobility/OOB(PT to begin per post-op protocol - MD will refer)     Equipment Recommendations  Rolling walker with 5" wheels(youth size)    Recommendations for Other Services       Precautions / Restrictions Precautions Precautions: Fall Required Braces or Orthoses: Other Brace/Splint Other Brace/Splint: CAM boot Restrictions Weight Bearing Restrictions: Yes LLE Weight Bearing: Non weight bearing    Mobility  Bed Mobility Overal bed mobility: Needs Assistance Bed Mobility: Supine to Sit     Supine to sit: Supervision     General bed mobility comments: Pt assists her leg to initiate movement, however is able to transition to EOB without difficulty. No assist required.   Transfers Overall transfer level: Needs assistance Equipment used: Rolling walker (2 wheeled) Transfers: Sit to/from Stand Sit to Stand: Min guard;Supervision         General transfer comment: Min guard to stand initially from EOB, however progressed to supervision for safety when standing from [facility]. VC's for hand placement on seated surface for safety, and for controlled descent to chair.   Ambulation/Gait Ambulation/Gait assistance: Min  assist Ambulation Distance (Feet): 8 Feet Assistive device: Rolling walker (2 wheeled) Gait Pattern/deviations: Step-to pattern;Decreased stride length;Trunk flexed Gait velocity: Decreased Gait velocity interpretation: Below normal speed for age/gender General Gait Details: Pt mobilizing well with the RW however was limited by pain in wrist at IV site. Pt was not able to tolerate bearing weight on the walker due to IV site pain.    Stairs            Wheelchair Mobility    Modified Rankin (Stroke Patients Only)       Balance Overall balance assessment: Needs assistance Sitting-balance support: Feet supported;No upper extremity supported Sitting balance-Leahy Scale: Good     Standing balance support: Bilateral upper extremity supported;During functional activity Standing balance-Leahy Scale: Poor Standing balance comment: Reliant on UE support due to WB status                            Cognition Arousal/Alertness: Awake/alert Behavior During Therapy: WFL for tasks assessed/performed Overall Cognitive Status: Within Functional Limits for tasks assessed                                        Exercises General Exercises - Lower Extremity Ankle Circles/Pumps: 10 reps Quad Sets: 10 reps Long Arc Quad: 10 reps Hip ABduction/ADduction: 10 reps    General Comments        Pertinent Vitals/Pain Pain Assessment: Faces Faces Pain Scale: Hurts little more  Pain Location: L ankle into knee Pain Descriptors / Indicators: Operative site guarding Pain Intervention(s): Limited activity within patient's tolerance;Monitored during session;Repositioned    Home Living                      Prior Function            PT Goals (current goals can now be found in the care plan section) Acute Rehab PT Goals Patient Stated Goal: Decrease pain; improve mobility PT Goal Formulation: With patient/family Time For Goal Achievement:  08/04/17 Potential to Achieve Goals: Good Progress towards PT goals: Progressing toward goals    Frequency    Min 5X/week      PT Plan Current plan remains appropriate    Co-evaluation              AM-PAC PT "6 Clicks" Daily Activity  Outcome Measure  Difficulty turning over in bed (including adjusting bedclothes, sheets and blankets)?: None Difficulty moving from lying on back to sitting on the side of the bed? : A Little Difficulty sitting down on and standing up from a chair with arms (e.g., wheelchair, bedside commode, etc,.)?: A Little Help needed moving to and from a bed to chair (including a wheelchair)?: A Little Help needed walking in hospital room?: A Little Help needed climbing 3-5 steps with a railing? : A Little 6 Click Score: 19    End of Session Equipment Utilized During Treatment: Gait belt;Other (comment)(CAM boot) Activity Tolerance: Patient tolerated treatment well Patient left: in chair;with call bell/phone within reach;with family/visitor present Nurse Communication: Mobility status PT Visit Diagnosis: Pain;Unsteadiness on feet (R26.81);Difficulty in walking, not elsewhere classified (R26.2) Pain - Right/Left: Left Pain - part of body: Ankle and joints of foot     Time: 8921-1941 PT Time Calculation (min) (ACUTE ONLY): 33 min  Charges:  $Gait Training: 8-22 mins $Therapeutic Exercise: 8-22 mins                    G Codes:       [name], PT, DPT Acute Rehabilitation Services Pager: [phone]    [name] 07/29/2017, 10:36 AM

## 2017-07-29 NOTE — Care Management (Signed)
Patient and Husband decline Home Health services, PT says patient will be ok at discharge.

## 2017-07-29 NOTE — Discharge Summary (Signed)
Patient ID: ANESIA BLACKWELL MRN: [id number] DOB/AGE: Jan 15, 1946 72 y.o.  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 Anxiety   . Arthritis    "back, neck" (03/08/2017)  . Asthma in child   . Bowel obstruction ([place])   . CAD (coronary artery disease), native coronary artery   . Chronic lower back pain   . Chronic neck pain   . Closed left tibial fracture   . Colon polyps   . Complication of anesthesia    "I was awake during one of my surgeries" reports that she felt them start the incision and heard them talking reports happened ~ 35 years ago    . Depression   . Diverticulosis   . Hyperlipidemia   . Irritable bowel syndrome   . Migraine    "used to be severe; now maybe 1/year or 2" (03/08/2017)  . Pneumonia    H!N!    Surgeries: Procedure(s): Hardware removal left ankle, takedown non-union with allograft bone graft, Intramedullary Nail HARDWARE REMOVAL on 07/27/2017   Consultants:   Discharged Condition: Improved  Hospital Course: MAELEE HOOT is an 72 y.o. female who was admitted 07/27/2017 for operative treatment ofOpen fracture of tibia and fibula, shaft, left, type I or II, with nonunion, subsequent encounter. Patient has severe unremitting pain that affects sleep, daily activities, and work/hobbies. After pre-op clearance the patient was taken to the operating room on 07/27/2017 and underwent  Procedure(s): Hardware removal left ankle, takedown non-union with allograft bone graft, Intramedullary Nail HARDWARE REMOVAL.    Patient was given perioperative antibiotics:  Anti-infectives (From admission, onward)   Start     Dose/Rate Route Frequency Ordered Stop   07/28/17 0600  ceFAZolin (ANCEF) IVPB 2g/100 mL premix     2 g 200 mL/hr over 30 Minutes Intravenous On call to O.R. 07/27/17 1206 07/27/17 1450   07/27/17 2100  ceFAZolin (ANCEF) IVPB 1 g/50 mL premix     1 g 100 mL/hr over 30 Minutes Intravenous Every 6 hours 07/27/17 1845 07/28/17 1040       Patient was given sequential compression devices, early ambulation, and chemoprophylaxis to prevent DVT.  Patient benefited maximally from hospital stay and there were no complications.    Recent vital signs:  Patient Vitals for the past 24 hrs:  BP Temp Temp src Pulse Resp SpO2  07/29/17 0500 (!) 87/49 98.4 F (36.9 C) Oral 76 16 90 %  07/28/17 2100 (!) 90/43 98.3 F (36.8 C) Oral 74 16 90 %     Recent laboratory studies:  Recent Labs    07/27/17 1224  WBC 6.4  HGB 13.3  HCT 40.6  PLT 290  NA 138  K 4.1  CL 106  CO2 24  BUN 27*  CREATININE 1.04*  GLUCOSE 83  CALCIUM 9.3     Discharge Medications:   Allergies as of 07/29/2017      Reactions   Shellfish-derived Products Anaphylaxis, Shortness Of Breath, Swelling   Throat swells; Iodine is ok   Lipitor [atorvastatin] Nausea Only, Other (See Comments)   Fatigue and Lethargy. Leg, back,Joint, and muscle pain. Per patient "My stool was a muddy clay color and I having diarrhea" pt states "  I never want to take another statin again"   Crestor [rosuvastatin Calcium]    UNSPECIFIED REACTION    Codeine Nausea And Vomiting      Medication List    STOP taking these medications   diclofenac 75 MG EC tablet Commonly known as:  VOLTAREN     TAKE these medications   ALPRAZolam 0.5 MG tablet Commonly known as:  XANAX Take 0.5 mg by mouth 2 (two) times daily.   aspirin 81 MG chewable tablet Chew 1 tablet (81 mg total) by mouth 2 (two) times daily after a meal.   CALCIUM 600 PO Take 1 tablet by mouth daily.   cholecalciferol 1000 units tablet Commonly known as:  VITAMIN D Take 1,000 Units by mouth daily.   FLUoxetine 40 MG  capsule Commonly known as:  PROZAC TAKE ONE CAPSULE BY MOUTH ONCE DAILY What changed:    how much to take  how to take this  when to take this   methocarbamol 500 MG tablet Commonly known as:  ROBAXIN Take 1 tablet (500 mg total) by mouth every 6 (six) hours as needed for muscle spasms.   metoprolol succinate 25 MG 24 hr tablet Commonly known as:  TOPROL-XL Take 12.5 mg by mouth daily.   multivitamin with minerals tablet Take 1 tablet by mouth daily.   oxyCODONE 5 MG immediate release tablet Commonly known as:  ROXICODONE Take 1-2 tablets (5-10 mg total) by mouth every 4 (four) hours as needed.   pravastatin 20 MG tablet Commonly known as:  PRAVACHOL Take 1 tablet (20 mg total) by mouth every evening.   traZODone 50 MG tablet Commonly known as:  DESYREL TAKE 1 TABLET BY MOUTH AT BEDTIME (QUALITEST BRAND ONLY [phone]) What changed:  See the new instructions.       Diagnostic Studies: Dg Tibia/fibula Left  Result Date: 07/27/2017 CLINICAL DATA:  72 year old female undergoing revision ORIF EXAM: LEFT TIBIA AND FIBULA - 2 VIEW COMPARISON:  Preoperative radiographs 07/21/2017 FINDINGS: 5 intraoperative spot radiographs demonstrate removal of the prior fractured buttress plate and screw construct. Interval placement of an intramedullary nail with a single proximal and 2 distal interlocking screws. Alignment of the fracture fragments appears improved. No evidence of immediate hardware complication. IMPRESSION: Revision ORIF as above. Electronically Signed   By: Jacqulynn Cadet M.D.   On: 07/27/2017 18:52   Dg Tibia/fibula Left Port  Result Date: 07/27/2017 CLINICAL DATA:  Status post ORIF revision of distal left tibia and fibular fractures. EXAM: PORTABLE LEFT TIBIA AND FIBULA - 2 VIEW COMPARISON:  Operative images, obtained today. Prior radiographs dated 06/22/2017. FINDINGS: Since the preoperative images, the lateral fixation plates and screws have been removed. An  intramedullary rod is been placed from the proximal tibia to the distal tibia epiphysis. Proximal and distal fixation screws appear well seated. The rod spans a comminuted fracture of the distal shaft. The major fracture components are well aligned. The fracture of the distal fibula is noted, remaining relatively well aligned. There are surrounding densities in the soft tissues. IMPRESSION: Well aligned fracture components following revision ORIF of the left leg. Electronically Signed   By: Lajean Manes M.D.   On: 07/27/2017 18:51   Dg C-arm 1-60 Min  Result Date: 07/27/2017 CLINICAL DATA:  Hardware removal EXAM: DG C-ARM 61-120 MIN COMPARISON:  07/21/2017 FINDINGS: Total fluoroscopy time 2 minutes 5 seconds. Five low resolution intraoperative views of the left tibia and fibula. Removal of surgical plate and fixating screws from the distal tibia  and fibula. Placement of intramedullary rod and fixating screws in the tibia. Prior fractures of the distal tibia and fibula. IMPRESSION: Intraoperative fluoroscopic assistance provided during hardware removal and intramedullary rod and screw fixation of the tibia Electronically Signed   By: Donavan Foil M.D.   On: 07/27/2017 18:50   Dg C-arm 1-60 Min  Result Date: 07/27/2017 CLINICAL DATA:  72 year old female undergoing hardware removal and revision fixation. EXAM: DG C-ARM 61-120 MIN COMPARISON:  Preoperative radiographs 07/21/2017 FINDINGS: Five intraoperative spot images demonstrate removal of the prior buttress plate and screw construct and placement of a new intramedullary rod across the nonunion of the distal tibial fracture. There are 2 distal interlocking screws and a single proximal interlocking screw. Alignment of the fracture fragments appears improved. No acute abnormality. IMPRESSION: Revision ORIF of nonunion of a distal tibial fracture without evidence of immediate hardware complication. Improved alignment of the fracture fragments. Electronically  Signed   By: Jacqulynn Cadet M.D.   On: 07/27/2017 18:49   Xr Ankle Complete Left  Result Date: 07/21/2017 X-rays of the left ankle show nonunion of a distal tibia fracture with varus malalignment and hardware failure with a break through the anterior-lateral tibial plate.  The plate on the fibula also appears to be pulling off the bone distally.  There are sclerotic changes in the bone as well.   Disposition: to home   Follow-up Information    Mcarthur Rossetti, MD Follow up in 2 week(s).   Specialty:  Orthopedic Surgery Contact information: [place] [address] [phone]            Signed: Mcarthur Rossetti 07/29/2017, 11:17 AM

## 2017-07-29 NOTE — Progress Notes (Signed)
Patient ID: [name], female   DOB: 05-12-1946, 72 y.o.   MRN: [id number] Looks good overall.  Dressings changed at the bedside.  Incisions look good.  Left leg is stable.  Can be discharged to home today.

## 2017-07-29 NOTE — Discharge Instructions (Signed)
No weight at all on your left leg. Expect swelling and some bloody drainage. Ice and elevation as needed for swelling. You can get the clear dressings wet in the shower daily. Ace wrap daily as needed. You can change your dressings in 4-5 days and place new dressings.

## 2017-08-10 ENCOUNTER — Ambulatory Visit (INDEPENDENT_AMBULATORY_CARE_PROVIDER_SITE_OTHER): Payer: Medicare Other | Admitting: Orthopaedic Surgery

## 2017-08-10 ENCOUNTER — Ambulatory Visit (INDEPENDENT_AMBULATORY_CARE_PROVIDER_SITE_OTHER): Payer: Medicare Other

## 2017-08-10 DIAGNOSIS — S82402M Unspecified fracture of shaft of left fibula, subsequent encounter for open fracture type I or II with nonunion: Secondary | ICD-10-CM | POA: Diagnosis not present

## 2017-08-10 DIAGNOSIS — S82202M Unspecified fracture of shaft of left tibia, subsequent encounter for open fracture type I or II with nonunion: Secondary | ICD-10-CM | POA: Diagnosis not present

## 2017-08-10 NOTE — Progress Notes (Signed)
The patient is a 72 year old is 2 weeks status post takedown of a nonunion removal of broken hardware from a left distal tibia shaft fracture.  We also removed the fibular plate due to a delayed union with hardware failure.  We were able to place an intramedullary rod and bone grafting as well.  She says she is doing well overall from a pain standpoint with better nonweightbearing.  On examination her calf is soft.  All incisions on the left side of healed nicely so I removed all sutures and placed Steri-Strips.  She is able to bend her knee and slightly move her ankle.  Her discomfort is minimal.  X-rays show an intramedullary nail in place on her left leg postop.  Her alignment is improved.  There is no evidence of healing at 2 weeks but there is bone graft material visible.  Again this is someone who had a delayed union to nonunion and broken plate.  We had to perform bone grafting and removal of the plate and placement of bone grafting and an intramedullary nail.  She is a candidate for bone stimulation at this point I believe she is medically necessary to treat this delayed union from a fracture that occurred over 5 months ago to try to get healing.  We will see her back in 4 weeks with a repeat AP and lateral of her left tibia.  I did remove all sutures today and placed Steri-Strips.  All questions concerns were answered and addressed.

## 2017-08-24 ENCOUNTER — Telehealth (INDEPENDENT_AMBULATORY_CARE_PROVIDER_SITE_OTHER): Payer: Self-pay | Admitting: Orthopaedic Surgery

## 2017-08-24 NOTE — Telephone Encounter (Signed)
Patient aware at front desk for her

## 2017-08-24 NOTE — Telephone Encounter (Signed)
Patient called stating that her handicap placard runs out next month and would like another one if possible. CB # [phone] or [phone]

## 2017-08-31 ENCOUNTER — Other Ambulatory Visit: Payer: Medicare Other

## 2017-08-31 DIAGNOSIS — E782 Mixed hyperlipidemia: Secondary | ICD-10-CM

## 2017-08-31 LAB — HEPATIC FUNCTION PANEL
ALT: 24 IU/L (ref 0–32)
AST: 28 IU/L (ref 0–40)
Albumin: 4.3 g/dL (ref 3.6–4.8)
Alkaline Phosphatase: 56 IU/L (ref 39–117)
Bilirubin Total: 0.3 mg/dL (ref 0.0–1.2)
Bilirubin, Direct: 0.11 mg/dL (ref 0.00–0.40)
Total Protein: 6.6 g/dL (ref 6.0–8.5)

## 2017-08-31 LAB — LIPID PANEL
Chol/HDL Ratio: 2 ratio (ref 0.0–4.4)
Cholesterol, Total: 163 mg/dL (ref 100–199)
HDL: 80 mg/dL (ref >39)
LDL Calculated: 71 mg/dL (ref 0–99)
Triglycerides: 58 mg/dL (ref 0–149)
VLDL Cholesterol Cal: 12 mg/dL (ref 5–40)

## 2017-09-01 ENCOUNTER — Encounter (INDEPENDENT_AMBULATORY_CARE_PROVIDER_SITE_OTHER): Payer: Self-pay | Admitting: Physician Assistant

## 2017-09-01 ENCOUNTER — Ambulatory Visit (INDEPENDENT_AMBULATORY_CARE_PROVIDER_SITE_OTHER): Payer: Medicare Other

## 2017-09-01 ENCOUNTER — Ambulatory Visit (INDEPENDENT_AMBULATORY_CARE_PROVIDER_SITE_OTHER): Payer: Medicare Other | Admitting: Physician Assistant

## 2017-09-01 DIAGNOSIS — M79662 Pain in left lower leg: Secondary | ICD-10-CM | POA: Diagnosis not present

## 2017-09-01 DIAGNOSIS — S82202M Unspecified fracture of shaft of left tibia, subsequent encounter for open fracture type I or II with nonunion: Secondary | ICD-10-CM

## 2017-09-01 DIAGNOSIS — S82402M Unspecified fracture of shaft of left fibula, subsequent encounter for open fracture type I or II with nonunion: Secondary | ICD-10-CM

## 2017-09-01 NOTE — Progress Notes (Signed)
Office Visit Note   Patient: [name]           Date of Birth: 11-Dec-1945           MRN: [id number] Visit Date: 09/01/2017              Requested by: Leighton Ruff, MD [place], [place] [address] PCP: Leighton Ruff, MD   Assessment & Plan: Visit Diagnoses:  1. Pain of left lower leg   2. Open fracture of tibia and fibula, shaft, left, type I or II, with nonunion, subsequent encounter     Plan: She is 50% weightbearing left leg in the Cam walker boot.  Scar tissue mobilization encouraged.  Elevation wiggling toes encouraged.  She will continue use of bone stimulator.  Will obtain 3 views of the left ankle at return.  Follow-Up Instructions: Return in about 3 weeks (around 09/22/2017).   Orders:  Orders Placed This Encounter  Procedures  . XR Tibia/Fibula Left   No orders of the defined types were placed in this encounter.     Procedures: No procedures performed   Clinical Data: No additional findings.   Subjective: Chief Complaint  Patient presents with  . Left Lower Leg - Pain    HPI [name] returns today 36 days status post hardware removal left ankle with takedown of the nonunion and subsequent allograft with bone graft and an IM nailing of the left tibia.  She has been nonweightbearing.  She said no chest pain shortness breath fevers chills.  She is using a bone stimulator.  She having throbbing along medial lateral aspect of the ankle.  Also some pain at the proximal end of the tibia.  Most of her pain occurs at night and in the evenings.  She is taking oxycodone for pain. Review of Systems See HPI otherwise negative  Objective: Vital Signs: There were no vitals taken for this visit.  Physical Exam  Constitutional: She is oriented to person, place, and time. She appears well-developed and well-nourished. No distress.  Pulmonary/Chest: Effort normal.  Neurological: She is alert and oriented to person, place, and time.    Skin: She is not diaphoretic.  Psychiatric: She has a normal mood and affect.    Ortho Exam Left knee surgical incisions healing well she has good range of motion of the knee.  Left calf supple nontender.  Dorsal pedal pulses intact.  Surgical incision left ankle is healing well no signs of infection.  The most proximal screw incision she has some tenderness over but no signs of infection.  She is able to dorsiflex plantarflex her ankle.  Sensation grossly intact throughout the foot. Specialty Comments:  No specialty comments available.  Imaging: Xr Tibia/fibula Left  Result Date: 09/01/2017 Left tibia: Status post IM nailing with bone grafting.  There is some early signs of consolidation.  No hardware failure.  No other bony abnormalities.  Talus well located within the ankle mortise.    PMFS History: Patient Active Problem List   Diagnosis Date Noted  . 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 Open fracture of tibia and fibula, shaft, left, type I or II, with nonunion, subsequent encounter 07/21/2017  . Closed pilon fracture, left, initial encounter 03/08/2017  . Closed fracture of left fibula and tibia   . Chest pain 10/27/2016  . Unstable angina ([place])   . CAD (coronary artery disease), native coronary artery   . Anxiety   .  Irritable bowel syndrome   . Depression   . Hyperlipidemia   . History of migraines    Past Medical History:  Diagnosis Date  . Anxiety   . Arthritis    "back, neck" (03/08/2017)  . Asthma in child   . Bowel obstruction ([place])   . CAD (coronary artery disease), native coronary artery   . Chronic lower back pain   . Chronic neck pain   . Closed left tibial fracture   . Colon polyps   . Complication of anesthesia    "I was awake during one of my surgeries" reports that she felt them start the incision and heard them talking reports happened ~ 35 years ago    . Depression   . Diverticulosis   .  Hyperlipidemia   . Irritable bowel syndrome   . Migraine    "used to be severe; now maybe 1/year or 2" (03/08/2017)  . Pneumonia    H!N!    Family History  Problem Relation Age of Onset  . Heart disease Mother        CHF  . Coronary artery disease Mother   . Hypertension Mother   . Hyperlipidemia Mother   . Heart disease Father        CHF  . Coronary artery disease Father   . Diabetes Sister        with all the complications  . Coronary artery disease Sister   . Stroke Sister 71       CVA  . Peripheral vascular disease Sister   . Colon cancer Maternal Grandfather   . Cancer Neg Hx        breast or colon    Past Surgical History:  Procedure Laterality Date  . ANKLE HARDWARE REMOVAL Left 07/27/2017   Hardware removal left ankle, takedown non-union with allograft bone graft, Intramedullary Nail (Left)  . APPENDECTOMY    . AUGMENTATION MAMMAPLASTY Bilateral   . BRACHIOPLASTY Bilateral    Arm Lift   . CARDIAC CATHETERIZATION    . CHOLECYSTECTOMY OPEN    . COLON SURGERY    . EXTERNAL FIXATION LEG Left 03/08/2017   Procedure: INTERNAL FIXATION LEFT ANKLE;  Surgeon: Newt Minion, MD;  Location: [facility];  Service: Orthopedics;  Laterality: Left;  . EYE MUSCLE SURGERY Bilateral X 2   "lazy eye; OR twice on each eye; Dr. Annamaria Boots"  . FRACTURE SURGERY    . HARDWARE REMOVAL Left 07/27/2017   Procedure: HARDWARE REMOVAL;  Surgeon: Mcarthur Rossetti, MD;  Location: [facility];  Service: Orthopedics;  Laterality: Left;  . LEFT HEART CATH AND CORONARY ANGIOGRAPHY N/A 10/27/2016   Procedure: Left Heart Cath and Coronary Angiography;  Surgeon: Peter M Martinique, MD;  Location: [facility];  Service: Cardiovascular;  Laterality: N/A;  . LEFT HEART CATHETERIZATION WITH CORONARY ANGIOGRAM N/A 08/16/2014   Procedure: LEFT HEART CATHETERIZATION WITH CORONARY ANGIOGRAM;  Surgeon: Sinclair Grooms, MD;  Location: [facility];  Service: Cardiovascular;  Laterality: N/A;  . NASAL SINUS SURGERY      . ORIF ANKLE FRACTURE Left 03/08/2017  . ORIF ANKLE FRACTURE Left 07/27/2017   Procedure: Hardware removal left ankle, takedown non-union with allograft bone graft, Intramedullary Nail;  Surgeon: Mcarthur Rossetti, MD;  Location: [facility];  Service: Orthopedics;  Laterality: Left;  . PARTIAL COLECTOMY  1970s  . TONSILLECTOMY     Social History   Occupational History  . Occupation: Retired Merchant navy officer)     Employer: RETIRED  Tobacco Use  . Smoking status: Former Smoker    Packs/day: 1.00    Years: 41.00    Pack years: 41.00    Types: Cigarettes    Last attempt to quit: 01/10/2010    Years since quitting: 7.6  . Smokeless tobacco: Never Used  Substance and Sexual Activity  . Alcohol use: No  . Drug use: No  . Sexual activity: Not Currently    Partners: Female

## 2017-09-09 ENCOUNTER — Telehealth (INDEPENDENT_AMBULATORY_CARE_PROVIDER_SITE_OTHER): Payer: Self-pay | Admitting: Radiology

## 2017-09-09 NOTE — Telephone Encounter (Signed)
Phone call from Dr Mellissa Kohut office to ask about premed for dental cleaning.  Per Caryl Pina I advised 2 gm amoxicillin per protocol.  They will treat and proceed with cleaning today.

## 2017-09-13 ENCOUNTER — Ambulatory Visit (INDEPENDENT_AMBULATORY_CARE_PROVIDER_SITE_OTHER): Payer: Medicare Other | Admitting: Orthopaedic Surgery

## 2017-09-22 ENCOUNTER — Ambulatory Visit (INDEPENDENT_AMBULATORY_CARE_PROVIDER_SITE_OTHER): Payer: Medicare Other

## 2017-09-22 ENCOUNTER — Ambulatory Visit (INDEPENDENT_AMBULATORY_CARE_PROVIDER_SITE_OTHER): Payer: Medicare Other | Admitting: Orthopaedic Surgery

## 2017-09-22 ENCOUNTER — Encounter (INDEPENDENT_AMBULATORY_CARE_PROVIDER_SITE_OTHER): Payer: Self-pay | Admitting: Orthopaedic Surgery

## 2017-09-22 DIAGNOSIS — S82202M Unspecified fracture of shaft of left tibia, subsequent encounter for open fracture type I or II with nonunion: Secondary | ICD-10-CM

## 2017-09-22 DIAGNOSIS — S82402M Unspecified fracture of shaft of left fibula, subsequent encounter for open fracture type I or II with nonunion: Secondary | ICD-10-CM

## 2017-09-22 NOTE — Progress Notes (Signed)
The patient will be 8 weeks this week status post removal of broken hardware from her left tibia and placement of an intramedullary and submental bone graft.  We have had her just weightbearing in a Cam walker on that ankle.  She is 72 years old.  She is feeling better overall.  She is been using a bone stim letter as well.  On exam she moves her knee and ankle easily.  Her skin incision is healed nicely and there is no problems with the skin itself.  2 views of the left tibia are obtained and show the hardware is intact and there is been interval healing.  At this point I would let her try to attempt full weightbearing in her Cam walker and let comfort be her guide.  We will see her back in 4 weeks with a repeat AP lateral and mortise of her left ankle.

## 2017-10-05 ENCOUNTER — Telehealth (INDEPENDENT_AMBULATORY_CARE_PROVIDER_SITE_OTHER): Payer: Self-pay

## 2017-10-05 NOTE — Telephone Encounter (Signed)
Patient stated that she is having left leg pain.  Stated that left leg has been hurting for a couple of weeks and hurts while walking.  Cb# is [phone].  Please advise.  Thank you.

## 2017-10-05 NOTE — Telephone Encounter (Signed)
Tell her to rest her leg now and to keep weight off of it. We then need to see her in the office either this week or early next week.

## 2017-10-05 NOTE — Telephone Encounter (Signed)
LMOM for patient of the below message and to call back to schedule appt with Artis Delay next week

## 2017-10-05 NOTE — Telephone Encounter (Signed)
See below, anything that can be done without appointment?

## 2017-10-06 NOTE — Telephone Encounter (Addendum)
Made appt for Wednesday 4/3. (patient couldn't come to appointments available on Monday due to keeping grandchild)

## 2017-10-13 ENCOUNTER — Ambulatory Visit (INDEPENDENT_AMBULATORY_CARE_PROVIDER_SITE_OTHER): Payer: Medicare Other | Admitting: Physician Assistant

## 2017-10-13 ENCOUNTER — Ambulatory Visit (INDEPENDENT_AMBULATORY_CARE_PROVIDER_SITE_OTHER): Payer: Medicare Other

## 2017-10-13 ENCOUNTER — Encounter (INDEPENDENT_AMBULATORY_CARE_PROVIDER_SITE_OTHER): Payer: Self-pay | Admitting: Physician Assistant

## 2017-10-13 DIAGNOSIS — S82402M Unspecified fracture of shaft of left fibula, subsequent encounter for open fracture type I or II with nonunion: Secondary | ICD-10-CM

## 2017-10-13 DIAGNOSIS — S82202M Unspecified fracture of shaft of left tibia, subsequent encounter for open fracture type I or II with nonunion: Secondary | ICD-10-CM

## 2017-10-13 NOTE — Progress Notes (Signed)
Office Visit Note   Patient: [name]           Date of Birth: [birth year]           MRN: [id number] Visit Date: 10/13/2017              Requested by: Leighton Ruff, MD [place], [place] [address] PCP: Leighton Ruff, MD   Assessment & Plan: Visit Diagnoses:  1. Open fracture of tibia and fibula, shaft, left, type I or II, with nonunion, subsequent encounter     Plan: We will have her continue to be weightbearing as tolerated in cam walker boot.  We will see her back in 1 month that time obtain AP lateral views of the left tibia.  In regards to the prominent hardware from the proximal screw this point time we will have her Pap at this area with an ABD pad or similar material while in the Cam walker boot.  Discussed with her that this may not be an issue as her muscle atrophy resolves.  Also discussed with her that would not recommend removing this at least until 1 year postop.  Questions encouraged and answered.  Follow-Up Instructions: Return in about 1 month (around 11/10/2017).   Orders:  Orders Placed This Encounter  Procedures  . XR Tibia/Fibula Left   No orders of the defined types were placed in this encounter.     Procedures: No procedures performed   Clinical Data: No additional findings.   Subjective: Chief Complaint  Patient presents with  . Left Leg - Pain    HPI Mrs. Dardis returns today 36 days status post removal of hardware left ankle with IM nailing left distal tibia fracture with allograft bone grafting.  She states that she is feeling something poking out of her leg and some increased pain at times with weightbearing.  She is ambulating with a boot Cam walker and feels that she is getting around better.  Said no fever. Review of Systems   Objective: Vital Signs: There were no vitals taken for this visit.  Physical Exam  Ortho Exam Surgical incisions healing well no signs of infection.  Palpable tip of the most  proximal screw spell is not tenting the skin.  Calf supple nontender.  Lower extremity no palpable soft where there is a palpable callus about the tibia and fibula. Specialty Comments:  No specialty comments available.  Imaging: Xr Tibia/fibula Left  Result Date: 10/13/2017 AP lateral views left tibia show no hardware failure.  Abundant callus formation from previous films.  No change in overall position alignment.    PMFS History: Patient Active Problem List   Diagnosis Date Noted  . 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 Open fracture of tibia and fibula, shaft, left, type I or II, with nonunion, subsequent encounter 07/21/2017  . Closed pilon fracture, left, initial encounter 03/08/2017  . Closed fracture of left fibula and tibia   . Chest pain 10/27/2016  . Unstable angina ([place])   . CAD (coronary artery disease), native coronary artery   . Anxiety   . Irritable bowel syndrome   . Depression   . Hyperlipidemia   . History of migraines    Past Medical History:  Diagnosis Date  . Anxiety   . Arthritis    "back, neck" (03/08/2017)  . Asthma in child   . Bowel obstruction ([place])   . CAD (coronary artery disease), native coronary artery   .  Chronic lower back pain   . Chronic neck pain   . Closed left tibial fracture   . Colon polyps   . Complication of anesthesia    "I was awake during one of my surgeries" reports that she felt them start the incision and heard them talking reports happened ~ 35 years ago    . Depression   . Diverticulosis   . Hyperlipidemia   . Irritable bowel syndrome   . Migraine    "used to be severe; now maybe 1/year or 2" (03/08/2017)  . Pneumonia    H!N!    Family History  Problem Relation Age of Onset  . Heart disease Mother        CHF  . Coronary artery disease Mother   . Hypertension Mother   . Hyperlipidemia Mother   . Heart disease Father        CHF  . Coronary artery disease Father   .  Diabetes Sister        with all the complications  . Coronary artery disease Sister   . Stroke Sister 30       CVA  . Peripheral vascular disease Sister   . Colon cancer Maternal Grandfather   . Cancer Neg Hx        breast or colon    Past Surgical History:  Procedure Laterality Date  . ANKLE HARDWARE REMOVAL Left 07/27/2017   Hardware removal left ankle, takedown non-union with allograft bone graft, Intramedullary Nail (Left)  . APPENDECTOMY    . AUGMENTATION MAMMAPLASTY Bilateral   . BRACHIOPLASTY Bilateral    Arm Lift   . CARDIAC CATHETERIZATION    . CHOLECYSTECTOMY OPEN    . COLON SURGERY    . EXTERNAL FIXATION LEG Left 03/08/2017   Procedure: INTERNAL FIXATION LEFT ANKLE;  Surgeon: Newt Minion, MD;  Location: [facility];  Service: Orthopedics;  Laterality: Left;  . EYE MUSCLE SURGERY Bilateral X 2   "lazy eye; OR twice on each eye; Dr. Annamaria Boots"  . FRACTURE SURGERY    . HARDWARE REMOVAL Left 07/27/2017   Procedure: HARDWARE REMOVAL;  Surgeon: Mcarthur Rossetti, MD;  Location: [facility];  Service: Orthopedics;  Laterality: Left;  . LEFT HEART CATH AND CORONARY ANGIOGRAPHY N/A 10/27/2016   Procedure: Left Heart Cath and Coronary Angiography;  Surgeon: Peter M Martinique, MD;  Location: [facility];  Service: Cardiovascular;  Laterality: N/A;  . LEFT HEART CATHETERIZATION WITH CORONARY ANGIOGRAM N/A 08/16/2014   Procedure: LEFT HEART CATHETERIZATION WITH CORONARY ANGIOGRAM;  Surgeon: Sinclair Grooms, MD;  Location: [facility];  Service: Cardiovascular;  Laterality: N/A;  . NASAL SINUS SURGERY    . ORIF ANKLE FRACTURE Left 03/08/2017  . ORIF ANKLE FRACTURE Left 07/27/2017   Procedure: Hardware removal left ankle, takedown non-union with allograft bone graft, Intramedullary Nail;  Surgeon: Mcarthur Rossetti, MD;  Location: [facility];  Service: Orthopedics;  Laterality: Left;  . PARTIAL COLECTOMY  1970s  . TONSILLECTOMY     Social History   Occupational History  . Occupation:  Retired Merchant navy officer)     Employer: RETIRED  Tobacco Use  . Smoking status: Former Smoker    Packs/day: 1.00    Years: 41.00    Pack years: 41.00    Types: Cigarettes    Last attempt to quit: 01/10/2010    Years since quitting: 7.7  . Smokeless tobacco: Never Used  Substance and Sexual Activity  . Alcohol use: No  . Drug use:  No  . Sexual activity: Not Currently    Partners: Female

## 2017-10-20 ENCOUNTER — Encounter (INDEPENDENT_AMBULATORY_CARE_PROVIDER_SITE_OTHER): Payer: Self-pay

## 2017-10-20 ENCOUNTER — Ambulatory Visit (INDEPENDENT_AMBULATORY_CARE_PROVIDER_SITE_OTHER): Payer: Medicare Other | Admitting: Orthopaedic Surgery

## 2017-10-28 ENCOUNTER — Telehealth: Payer: Self-pay | Admitting: Pharmacist

## 2017-10-28 ENCOUNTER — Telehealth: Payer: Self-pay | Admitting: Cardiovascular Disease

## 2017-10-28 NOTE — Telephone Encounter (Signed)
Close encounter 

## 2017-10-28 NOTE — Telephone Encounter (Signed)
Pt called very concerned because her PCP told her that her kidney function and liver function were worsening. She wonders if this is related to her cardiac medications - specifically her pravastatin. Her ALT is mildly elevated to 69 on her most recent panel. AST WNL. Advised her that pravastatin could be playing a role in change in liver function, but should not affect kidney function. Of note hepatic function was WNL when we checked 1.5 months ago and she was taking pravastatin for several months at that time. Advised that she heed recommendations of PCP to repeat panel in 2-3 weeks and if trending up could consider trial off pravastatin to see if contributing, but that usually do not discontinue statins unless 3XULN. She wonders if take oxycodone for 3 days may have contributed. Advised that could have played a role. She states she feels much better and will stay off oxycodone and call if she has any further questions.

## 2017-11-08 ENCOUNTER — Telehealth: Payer: Self-pay | Admitting: Pharmacist

## 2017-11-08 NOTE — Telephone Encounter (Signed)
Pt called and states that she has decided not to take medications that Dr. Acie Fredrickson started. She then had to go because her phone was ringing. Unsure exactly which medications she is referring to as per our chart Dr. Acie Fredrickson only prescribes pravastatin. Will await call back.

## 2017-11-08 NOTE — Telephone Encounter (Signed)
No new suggestions

## 2017-11-08 NOTE — Telephone Encounter (Addendum)
She called back and reports that the metoprolol and statin (pravastatin). She reports that she gets extremely tired on pravastatin. And she is not willing to continue. When questioned about the metoprolol she states that she had an elevated liver count and she knows Dr. Acie Fredrickson has told her this is important, but she is not willing to continue on these medications as this time due to quality of life. She states that she did not want to make a special trip in to see him just to tell him that she would not continue. Advised I would forward to Dr. Acie Fredrickson as Juluis Rainier.

## 2017-11-11 ENCOUNTER — Encounter (INDEPENDENT_AMBULATORY_CARE_PROVIDER_SITE_OTHER): Payer: Self-pay | Admitting: Orthopaedic Surgery

## 2017-11-11 ENCOUNTER — Ambulatory Visit (INDEPENDENT_AMBULATORY_CARE_PROVIDER_SITE_OTHER): Payer: Medicare Other

## 2017-11-11 ENCOUNTER — Ambulatory Visit (INDEPENDENT_AMBULATORY_CARE_PROVIDER_SITE_OTHER): Payer: Medicare Other | Admitting: Orthopaedic Surgery

## 2017-11-11 DIAGNOSIS — S82402M Unspecified fracture of shaft of left fibula, subsequent encounter for open fracture type I or II with nonunion: Secondary | ICD-10-CM | POA: Diagnosis not present

## 2017-11-11 DIAGNOSIS — S82202M Unspecified fracture of shaft of left tibia, subsequent encounter for open fracture type I or II with nonunion: Secondary | ICD-10-CM | POA: Diagnosis not present

## 2017-11-11 NOTE — Progress Notes (Signed)
The patient is now 3-1/2 months status post removal of broken hardware from her left distal tibia and fibula and placement of intramedullary nail and bone grafting.  She is to use her bone stimulator now weightbearing as tolerated.  Is not wearing any boot at this standpoint is doing well.  Her only problems are really related to low back pain and bilateral hip pain of the trochanteric areas from where she was walking different for so long as this fracture needed to heal.  Her original break occurred in August of last year.  On exam she has excellent range of motion both hips both knees and both ankles.  I do feel that she does have facet joint arthritis in her low spine of the lumbar area as well as bilateral hip bursitis.  X-rays of the left distal tibia and fibula show the intact rod from where her fracture is healed now on the distal tibia.  The fibula may be a nonunion and on one plane but she is asymptomatic to palpation around this area.  Ankle mortise is well located.  This point we will send her outpatient physical therapy mainly to work on bilateral lower extremity strengthening as well as her balance and coordination.  I like to work on her low back in the trochanteric area of the hips with any modalities.  We will see her back in 3 months with a repeat AP and lateral of the left tibia.

## 2017-11-12 ENCOUNTER — Other Ambulatory Visit (INDEPENDENT_AMBULATORY_CARE_PROVIDER_SITE_OTHER): Payer: Self-pay

## 2017-11-12 DIAGNOSIS — M545 Low back pain: Principal | ICD-10-CM

## 2017-11-12 DIAGNOSIS — G8929 Other chronic pain: Secondary | ICD-10-CM

## 2017-11-19 ENCOUNTER — Encounter: Payer: Self-pay | Admitting: Physical Therapy

## 2017-11-19 ENCOUNTER — Other Ambulatory Visit: Payer: Self-pay

## 2017-11-19 ENCOUNTER — Ambulatory Visit: Payer: Medicare Other | Attending: Orthopaedic Surgery | Admitting: Physical Therapy

## 2017-11-19 DIAGNOSIS — M6281 Muscle weakness (generalized): Secondary | ICD-10-CM | POA: Diagnosis present

## 2017-11-19 DIAGNOSIS — M545 Low back pain, unspecified: Secondary | ICD-10-CM

## 2017-11-19 DIAGNOSIS — M25552 Pain in left hip: Secondary | ICD-10-CM | POA: Diagnosis present

## 2017-11-19 DIAGNOSIS — R262 Difficulty in walking, not elsewhere classified: Secondary | ICD-10-CM | POA: Diagnosis present

## 2017-11-19 DIAGNOSIS — M25551 Pain in right hip: Secondary | ICD-10-CM

## 2017-11-19 DIAGNOSIS — G8929 Other chronic pain: Secondary | ICD-10-CM | POA: Diagnosis present

## 2017-11-19 NOTE — Therapy (Signed)
[facility] [address] [place], [place], [address] Phone: [phone]   Fax:  [phone]  Physical Therapy Evaluation  Patient Details  Name: [name] MRN: [id number] Date of Birth: October 31, 1945 Referring Provider: Dr. Ninfa Linden   Encounter Date: 11/19/2017  PT End of Session - 11/19/17 1707    Visit Number  1    Date for PT Re-Evaluation  01/14/18    Authorization Type  Medicare    PT Start Time  1020    PT Stop Time  1100    PT Time Calculation (min)  40 min    Activity Tolerance  Patient tolerated treatment well       Past Medical History:  Diagnosis Date  . Anxiety   . Arthritis    "back, neck" (03/08/2017)  . Asthma in child   . Bowel obstruction ([place])   . CAD (coronary artery disease), native coronary artery   . Chronic lower back pain   . Chronic neck pain   . Closed left tibial fracture   . Colon polyps   . Complication of anesthesia    "I was awake during one of my surgeries" reports that she felt them start the incision and heard them talking reports happened ~ 35 years ago    . Depression   . Diverticulosis   . Hyperlipidemia   . Irritable bowel syndrome   . Migraine    "used to be severe; now maybe 1/year or 2" (03/08/2017)  . Pneumonia    H!N!    Past Surgical History:  Procedure Laterality Date  . ANKLE HARDWARE REMOVAL Left 07/27/2017   Hardware removal left ankle, takedown non-union with allograft bone graft, Intramedullary Nail (Left)  . APPENDECTOMY    . AUGMENTATION MAMMAPLASTY Bilateral   . BRACHIOPLASTY Bilateral    Arm Lift   . CARDIAC CATHETERIZATION    . CHOLECYSTECTOMY OPEN    . COLON SURGERY    . EXTERNAL FIXATION LEG Left 03/08/2017   Procedure: INTERNAL FIXATION LEFT ANKLE;  Surgeon: Newt Minion, MD;  Location: [facility];  Service: Orthopedics;  Laterality: Left;  . EYE MUSCLE SURGERY Bilateral X 2   "lazy eye; OR twice on each eye; Dr. Annamaria Boots"  . FRACTURE  SURGERY    . HARDWARE REMOVAL Left 07/27/2017   Procedure: HARDWARE REMOVAL;  Surgeon: Mcarthur Rossetti, MD;  Location: [facility];  Service: Orthopedics;  Laterality: Left;  . LEFT HEART CATH AND CORONARY ANGIOGRAPHY N/A 10/27/2016   Procedure: Left Heart Cath and Coronary Angiography;  Surgeon: Peter M Martinique, MD;  Location: [facility];  Service: Cardiovascular;  Laterality: N/A;  . LEFT HEART CATHETERIZATION WITH CORONARY ANGIOGRAM N/A 08/16/2014   Procedure: LEFT HEART CATHETERIZATION WITH CORONARY ANGIOGRAM;  Surgeon: Sinclair Grooms, MD;  Location: [facility];  Service: Cardiovascular;  Laterality: N/A;  . NASAL SINUS SURGERY    . ORIF ANKLE FRACTURE Left 03/08/2017  . ORIF ANKLE FRACTURE Left 07/27/2017   Procedure: Hardware removal left ankle, takedown non-union with allograft bone graft, Intramedullary Nail;  Surgeon: Mcarthur Rossetti, MD;  Location: [facility];  Service: Orthopedics;  Laterality: Left;  . PARTIAL COLECTOMY  1970s  . TONSILLECTOMY      There were no vitals filed for this visit.   Subjective Assessment - 11/19/17 1027    Subjective  Patient presents with low back pain chronic history but aggravated since left lower leg fracture 2 surgeries 10/24/16 and 07/2017.  The most recent surgery was for removal of broken hardware and left distal tibia/fibula and intramedullary nailing and bone grafting.  Bedrest for weeks after both surgeries.   NWB initially and then progressed to walker.  Moderate limp.  Screw in knee painful but can't be removed.  Just came out of boot 2 weeks ago.      Pertinent History  Dr. Trevor Mace office visit note 11/11/17:  This point we will send her outpatient physical therapy mainly to work on bilateral lower extremity strengthening as well as her balance and coordination.  I like to work on her low back in the trochanteric area of the hips with any modalities.  We will see her back in 3 months with a repeat AP and lateral of the left tibia.     Limitations  Walking;House hold activities    How long can you stand comfortably?  want to be out of pain enough to walk comfortably 2-3 miles/day, hiking; whatever is realistic for the knee    How long can you walk comfortably?  1 mile/day 15 min    Diagnostic tests  LE x-ray;  lumbar spine facet joint arthritis    Patient Stated Goals  walk comfortably 2-3 miles, hike    Currently in Pain?  Yes    Pain Location  Back    Pain Orientation  Right;Left    Pain Type  Chronic pain    Pain Onset  More than a month ago    Pain Frequency  Constant    Aggravating Factors   screw in left knee ; standing and walking    Pain Relieving Factors  lying down supine         [facility] PT Assessment - 11/19/17 0001      Assessment   Medical Diagnosis  LBP with bilateral bursitis     Referring Provider  Dr. Ninfa Linden    Onset Date/Surgical Date  07/26/17    Next MD Visit  97months    Prior Therapy  after 1st surgery      Precautions   Precautions  None      Restrictions   Weight Bearing Restrictions  No    Other Position/Activity Restrictions  WBAT      Balance Screen   Has the patient fallen in the past 6 months  No    Has the patient had a decrease in activity level because of a fear of falling?   No    Is the patient reluctant to leave their home because of a fear of falling?   No      Home Environment   Living Environment  Private residence    Living Arrangements  Spouse/significant other    Type of [place] to enter    Home Layout  Two level    Alternate Level Stairs-Number of Steps  [place] - standard      Prior Function   Level of Independence  Independent with basic ADLs    Vocation  Retired    Leisure  walk, ex, stand      Observation/Other Assessments   Focus on Therapeutic Outcomes (FOTO)   52% limitation      Posture/Postural Control   Posture/Postural Control  Postural limitations    Postural Limitations  Decreased lumbar  lordosis    Posture Comments  Unable to SLS      AROM   Right Hip Extension  5    Right Hip Flexion  100    Left Hip Extension  5    Left Hip Flexion  100    Right Knee Extension  0    Right Knee Flexion  130    Left Knee Extension  8    Left Knee Flexion  130    Lumbar Flexion  not tested secondary to osteoporosis full DKTC in supine    Lumbar Extension  10    Lumbar - Right Side Bend  35    Lumbar - Left Side Bend  35      Strength   Strength Assessment Site  -- unable to rise from a standard chair without UE assist    Right Hip Flexion  4/5    Right Hip Extension  4-/5    Right Hip ABduction  4-/5    Left Hip Flexion  4/5    Left Hip Extension  4-/5    Left Hip ABduction  4-/5    Right Knee Flexion  5/5    Right Knee Extension  5/5    Left Knee Flexion  3+/5    Left Knee Extension  3/5    Lumbar Flexion  4-/5    Lumbar Extension  4-/5      Flexibility   Hamstrings  55 degrees bil      Palpation   Palpation comment  tender over bil trochanteric bursa;  left tender points gluteals, piriformis      Slump test   Findings  Negative      Straight Leg Raise   Findings  Negative      Ambulation/Gait   Gait Comments  moderate limp with decreased stance time on left      [employer] comment:  to be assessed next visit      Timed Up and Go Test   Normal TUG (seconds)  -- to be assessed next visit                Objective measurements completed on examination: See above findings.                PT Short Term Goals - 11/19/17 1725      PT SHORT TERM GOAL #1   Title  The patient will demonstrate knowledge of safe back movements with osteoporosis and appropriate basic back  exercises.    Time  4    Period  Weeks    Status  New    Target Date  12/17/17      PT SHORT TERM GOAL #2   Title  The patient will report a 40% reduction in lateral hip pain     Time  4    Period  Weeks    Status  New      PT SHORT TERM GOAL #3    Title  The patient will have HS length of 75 degrees needed for more normalized stride length    Time  4    Period  Weeks    Status  New      PT SHORT TERM GOAL #4   Title  The patient will have gait speed with Timed up and Go of 12 sec or less closer to normal range for age    Time  4    Period  Weeks    Status  New      PT SHORT TERM GOAL #5   Title  The patient will have  improved core and LE strength needed to walk 20 minutes with minimal to moderate pain    Time  4    Period  Weeks    Status  New        PT Long Term Goals - 11/19/17 1742      PT LONG TERM GOAL #1   Title  The patient will be independent in safe, self progression of HEP needed for further pain reduction and strength    Time  8    Period  Weeks    Status  New    Target Date  01/14/18      PT LONG TERM GOAL #2   Title  The patient will report a 60% improvement in back and hip pain    Time  8    Period  Weeks    Status  New      PT LONG TERM GOAL #3   Title  The patient will have improved core and hip strength to grossly 4/5 needed to rise from a standard chair without UE use    Time  8    Period  Weeks    Status  New      PT LONG TERM GOAL #4   Title  The patient will have improved gait speed to ambulate 600 feet in 6 minutes     Time  8    Period  Weeks    Status  New      PT LONG TERM GOAL #5   Title  FOTO functional outcome score improved from 52% limitation to 40% indicating improved function with less pain    Time  8    Period  Weeks    Status  New             Plan - 11/19/17 1708    Clinical Impression Statement  The patient presents with a PT order for chronic bilateral LBP and bilateral hip bursitis.  Medical history significant for open fracture of tibia/fibula in April 2018 and then a 2nd surgery in January 2019 for broken hardward removal and intramedullary nailing and bone grafting.  During the recovery from  these surgeries and immobilization she reports her chronic LBP has  been exacerbated and her hips have been bothering her when she sidelies.  She used a walking boot until 2 weeks ago which also aggravated the pain.  She has a moderate limp with very shortened stance time on left.  She reports her knee pain "from a screw" causes her to limp more which makes her back hurt more.  She also has osteoporosis but she lacks knowledge on precautions associated with this.  Numerous tender points in left > right gluteals and piriformis.  Weakness in core muscles, hip extension and abduction and left knee flexion and extension.  She lacks strength to rise from a chair without UE use.  Slow antalgic gait speed.  She would benefit from PT to address these deficits.      History and Personal Factors relevant to plan of care:  osteoporosis with fracture and delayed healing; multi body regions and systems affected;  cardiovascular disease     Clinical Presentation  Evolving    Clinical Presentation due to:  worsening of back pain over time    Clinical Decision Making  Moderate    Rehab Potential  Good    Clinical Impairments Affecting Rehab Potential  osteoporosis    PT Frequency  2x / week    PT Duration  8 weeks    PT Treatment/Interventions  ADLs/Self Care Home Management;Cryotherapy;Electrical Stimulation;Ultrasound;Moist Heat;Iontophoresis 4mg /ml Dexamethasone;Therapeutic activities;Therapeutic exercise;Patient/family education;Neuromuscular re-education;Manual techniques;Taping;Dry needling    PT Next Visit Plan  check TUG and/or 6 min walk test;  BERG;  iontophoresis to bursa if cert signed;  DN to left gluteals; core strengthening;  Nu-Step       Patient will benefit from skilled therapeutic intervention in order to improve the following deficits and impairments:  Abnormal gait, Pain, Increased muscle spasms, Increased fascial restricitons, Decreased activity tolerance, Decreased range of motion, Decreased strength, Difficulty walking, Impaired perceived functional  ability  Visit Diagnosis: Chronic bilateral low back pain without sciatica - Plan: PT plan of care cert/re-cert  Muscle weakness (generalized) - Plan: PT plan of care cert/re-cert  Difficulty in walking, not elsewhere classified - Plan: PT plan of care cert/re-cert  Pain in left hip - Plan: PT plan of care cert/re-cert  Pain in right hip - Plan: PT plan of care cert/re-cert     Problem List Patient Active Problem List   Diagnosis Date Noted  . 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 Open fracture of tibia and fibula, shaft, left, type I or II, with nonunion, subsequent encounter 07/21/2017  . Closed pilon fracture, left, initial encounter 03/08/2017  . Closed fracture of left fibula and tibia   . Chest pain 10/27/2016  . Unstable angina ([place])   . CAD (coronary artery disease), native coronary artery   . Anxiety   . Irritable bowel syndrome   . Depression   . Hyperlipidemia   . History of migraines    Ruben Im, PT 11/19/17 5:53 PM Phone: [phone] Fax: [phone]  Alvera Singh 11/19/2017, 5:53 PM  [facility] [address] [place], [place], [address] Phone: [phone]   Fax:  [phone]  Name: [name] MRN: [id number] Date of Birth: [birth year]

## 2017-11-23 ENCOUNTER — Encounter: Payer: Medicare Other | Admitting: Physical Therapy

## 2017-11-25 ENCOUNTER — Encounter: Payer: Self-pay | Admitting: Physical Therapy

## 2017-11-25 ENCOUNTER — Ambulatory Visit: Payer: Medicare Other | Admitting: Physical Therapy

## 2017-11-25 DIAGNOSIS — M25552 Pain in left hip: Secondary | ICD-10-CM

## 2017-11-25 DIAGNOSIS — G8929 Other chronic pain: Secondary | ICD-10-CM

## 2017-11-25 DIAGNOSIS — M545 Low back pain, unspecified: Secondary | ICD-10-CM

## 2017-11-25 DIAGNOSIS — R262 Difficulty in walking, not elsewhere classified: Secondary | ICD-10-CM

## 2017-11-25 DIAGNOSIS — M6281 Muscle weakness (generalized): Secondary | ICD-10-CM

## 2017-11-25 DIAGNOSIS — M25551 Pain in right hip: Secondary | ICD-10-CM

## 2017-11-25 NOTE — Patient Instructions (Addendum)

## 2017-11-25 NOTE — Therapy (Signed)
[facility] [address], [place], [place], [address] Phone: [phone]   Fax:  [phone]  Physical Therapy Treatment  Patient Details  Name: [name] MRN: [id number] Date of Birth: [birth year] Referring Provider: Dr. Ninfa Linden   Encounter Date: 11/25/2017  PT End of Session - 11/25/17 1226    Visit Number  2    Date for PT Re-Evaluation  01/14/18    Authorization Type  Medicare    PT Start Time  5053    PT Stop Time  1016    PT Time Calculation (min)  38 min    Activity Tolerance  Patient tolerated treatment well       Past Medical History:  Diagnosis Date  . Anxiety   . Arthritis    "back, neck" (03/08/2017)  . Asthma in child   . Bowel obstruction ([place])   . CAD (coronary artery disease), native coronary artery   . Chronic lower back pain   . Chronic neck pain   . Closed left tibial fracture   . Colon polyps   . Complication of anesthesia    "I was awake during one of my surgeries" reports that she felt them start the incision and heard them talking reports happened ~ 35 years ago    . Depression   . Diverticulosis   . Hyperlipidemia   . Irritable bowel syndrome   . Migraine    "used to be severe; now maybe 1/year or 2" (03/08/2017)  . Pneumonia    H!N!    Past Surgical History:  Procedure Laterality Date  . ANKLE HARDWARE REMOVAL Left 07/27/2017   Hardware removal left ankle, takedown non-union with allograft bone graft, Intramedullary Nail (Left)  . APPENDECTOMY    . AUGMENTATION MAMMAPLASTY Bilateral   . BRACHIOPLASTY Bilateral    Arm Lift   . CARDIAC CATHETERIZATION    . CHOLECYSTECTOMY OPEN    . COLON SURGERY    . EXTERNAL FIXATION LEG Left 03/08/2017   Procedure: INTERNAL FIXATION LEFT ANKLE;  Surgeon: Newt Minion, MD;  Location: [place];  Service: Orthopedics;  Laterality: Left;  . EYE MUSCLE SURGERY Bilateral X 2   "lazy eye; OR twice on each eye; Dr. Annamaria Boots"  . FRACTURE  SURGERY    . HARDWARE REMOVAL Left 07/27/2017   Procedure: HARDWARE REMOVAL;  Surgeon: Mcarthur Rossetti, MD;  Location: [place];  Service: Orthopedics;  Laterality: Left;  . LEFT HEART CATH AND CORONARY ANGIOGRAPHY N/A 10/27/2016   Procedure: Left Heart Cath and Coronary Angiography;  Surgeon: Peter M Martinique, MD;  Location: [place] [facility];  Service: Cardiovascular;  Laterality: N/A;  . LEFT HEART CATHETERIZATION WITH CORONARY ANGIOGRAM N/A 08/16/2014   Procedure: LEFT HEART CATHETERIZATION WITH CORONARY ANGIOGRAM;  Surgeon: Sinclair Grooms, MD;  Location: [facility] CATH LAB;  Service: Cardiovascular;  Laterality: N/A;  . NASAL SINUS SURGERY    . ORIF ANKLE FRACTURE Left 03/08/2017  . ORIF ANKLE FRACTURE Left 07/27/2017   Procedure: Hardware removal left ankle, takedown non-union with allograft bone graft, Intramedullary Nail;  Surgeon: Mcarthur Rossetti, MD;  Location: [place];  Service: Orthopedics;  Laterality: Left;  . PARTIAL COLECTOMY  1970s  . TONSILLECTOMY      There were no vitals filed for this visit.  Subjective Assessment - 11/25/17 0938    Subjective  My knee always hurts and makes my back hurt from limping.      Currently in Pain?  Yes  Pain Score  4     Pain Location  Knee    Multiple Pain Sites  Yes    Pain Score  4    Pain Location  Back    Pain Type  Chronic pain         [facility] PT Assessment - 11/25/17 0001      Timed Up and Go Test   Normal TUG (seconds)  19.03                   [facility] Adult PT Treatment/Exercise - 11/25/17 0001      Ambulation/Gait   Gait Comments  gait with SPC       Therapeutic Activites    Therapeutic Activities  ADL's    ADL's  walking with cane, standing, sit to stand      Neuro Re-ed    Neuro Re-ed Details   transverse abdominus and gluteal muscle activation       Lumbar Exercises: Supine   Ab Set  10 reps    Bent Knee Raise  10 reps    Bridge with Ball Squeeze  10 reps    Isometric Hip Flexion  5 reps     Other Supine Lumbar Exercises  red band UE horizontal abduction and diagonals 5x each      Knee/Hip Exercises: Aerobic   Nustep  L 1 7 min       Iontophoresis   Type of Iontophoresis  Dexamethasone    Location  lateral right hip    Dose  4 mg/ml     Time  4-6 hour patch             PT Education - 11/25/17 1003    Education provided  Yes    Education Details  abdominal brace, hand to knee push, single knee push; ionto info    Person(s) Educated  Patient    Methods  Explanation;Demonstration;Handout    Comprehension  Returned demonstration;Verbalized understanding       PT Short Term Goals - 11/19/17 1725      PT SHORT TERM GOAL #1   Title  The patient will demonstrate knowledge of safe back movements with osteoporosis and appropriate basic back  exercises.    Time  4    Period  Weeks    Status  New    Target Date  12/17/17      PT SHORT TERM GOAL #2   Title  The patient will report a 40% reduction in lateral hip pain     Time  4    Period  Weeks    Status  New      PT SHORT TERM GOAL #3   Title  The patient will have HS length of 75 degrees needed for more normalized stride length    Time  4    Period  Weeks    Status  New      PT SHORT TERM GOAL #4   Title  The patient will have gait speed with Timed up and Go of 12 sec or less closer to normal range for age    Time  4    Period  Weeks    Status  New      PT SHORT TERM GOAL #5   Title  The patient will have improved core and LE strength needed to walk 20 minutes with minimal to moderate pain    Time  4    Period  Weeks  Status  New        PT Long Term Goals - 11/19/17 1742      PT LONG TERM GOAL #1   Title  The patient will be independent in safe, self progression of HEP needed for further pain reduction and strength    Time  8    Period  Weeks    Status  New    Target Date  01/14/18      PT LONG TERM GOAL #2   Title  The patient will report a 60% improvement in back and hip pain    Time  8     Period  Weeks    Status  New      PT LONG TERM GOAL #3   Title  The patient will have improved core and hip strength to grossly 4/5 needed to rise from a standard chair without UE use    Time  8    Period  Weeks    Status  New      PT LONG TERM GOAL #4   Title  The patient will have improved gait speed to ambulate 600 feet in 6 minutes     Time  8    Period  Weeks    Status  New      PT LONG TERM GOAL #5   Title  FOTO functional outcome score improved from 52% limitation to 40% indicating improved function with less pain    Time  8    Period  Weeks    Status  New            Plan - 11/25/17 1230    Clinical Impression Statement  The patient continues to have a significant limp.  Her limp is very likely a contributing factor to her right > left lateral hip pain and LBP.   Very slow gait speed with Timed up and Go test at 19.03 seconds.  Gait stability and left LE stance time much improved with a cane but she is not receptive to this after using a walker for so long.  Minimal to no knee pain while on Nu-Step.  Good response to core strengthening ex in supine.  Therapist closely monitoring response will all interventions.      Rehab Potential  Good    Clinical Impairments Affecting Rehab Potential  osteoporosis    PT Frequency  2x / week    PT Duration  8 weeks    PT Treatment/Interventions  ADLs/Self Care Home Management;Cryotherapy;Electrical Stimulation;Ultrasound;Moist Heat;Iontophoresis 4mg /ml Dexamethasone;Therapeutic activities;Therapeutic exercise;Patient/family education;Neuromuscular re-education;Manual techniques;Taping;Dry needling    PT Next Visit Plan   6 min walk test;  BERG; assess response to iontophoresis #1 to right hip;   DN to left gluteals; core strengthening;  Nu-Step increase time;  left LE strengthening; retro stepping, stand tall gluteal ex       Patient will benefit from skilled therapeutic intervention in order to improve the following deficits and  impairments:  Abnormal gait, Pain, Increased muscle spasms, Increased fascial restricitons, Decreased activity tolerance, Decreased range of motion, Decreased strength, Difficulty walking, Impaired perceived functional ability  Visit Diagnosis: Chronic bilateral low back pain without sciatica  Muscle weakness (generalized)  Difficulty in walking, not elsewhere classified  Pain in left hip  Pain in right hip     Problem List Patient Active Problem List   Diagnosis Date Noted  . 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 Open fracture of tibia  and fibula, shaft, left, type I or II, with nonunion, subsequent encounter 07/21/2017  . Closed pilon fracture, left, initial encounter 03/08/2017  . Closed fracture of left fibula and tibia   . Chest pain 10/27/2016  . Unstable angina ([place])   . CAD (coronary artery disease), native coronary artery   . Anxiety   . Irritable bowel syndrome   . Depression   . Hyperlipidemia   . History of migraines    Ruben Im, PT 11/25/17 12:39 PM Phone: [phone] Fax: [phone]  Alvera Singh 11/25/2017, 12:38 PM  [facility] [address] [place], [place], [address] Phone: [phone]   Fax:  [phone]  Name: RIKAYLA DEMMON MRN: [id number] Date of Birth: [birth year]

## 2017-11-29 ENCOUNTER — Ambulatory Visit: Payer: Medicare Other | Admitting: Physical Therapy

## 2017-11-29 ENCOUNTER — Encounter: Payer: Self-pay | Admitting: Physical Therapy

## 2017-11-29 DIAGNOSIS — M545 Low back pain, unspecified: Secondary | ICD-10-CM

## 2017-11-29 DIAGNOSIS — R262 Difficulty in walking, not elsewhere classified: Secondary | ICD-10-CM

## 2017-11-29 DIAGNOSIS — G8929 Other chronic pain: Secondary | ICD-10-CM

## 2017-11-29 DIAGNOSIS — M25552 Pain in left hip: Secondary | ICD-10-CM

## 2017-11-29 DIAGNOSIS — M6281 Muscle weakness (generalized): Secondary | ICD-10-CM

## 2017-11-29 DIAGNOSIS — M25551 Pain in right hip: Secondary | ICD-10-CM

## 2017-11-29 NOTE — Therapy (Signed)
[facility] [address], [place], [place], [address] Phone: [phone]   Fax:  [phone]  Physical Therapy Treatment  Patient Details  Name: [name] MRN: [id number] Date of Birth: 04/21/1946 Referring Provider: Dr. Ninfa Linden   Encounter Date: 11/29/2017  PT End of Session - 11/29/17 1056    Visit Number  3    Date for PT Re-Evaluation  01/14/18    Authorization Type  Medicare    PT Start Time  1020    PT Stop Time  1102    PT Time Calculation (min)  42 min    Activity Tolerance  Patient tolerated treatment well       Past Medical History:  Diagnosis Date  . Anxiety   . Arthritis    "back, neck" (03/08/2017)  . Asthma in child   . Bowel obstruction ([place])   . CAD (coronary artery disease), native coronary artery   . Chronic lower back pain   . Chronic neck pain   . Closed left tibial fracture   . Colon polyps   . Complication of anesthesia    "I was awake during one of my surgeries" reports that she felt them start the incision and heard them talking reports happened ~ 35 years ago    . Depression   . Diverticulosis   . Hyperlipidemia   . Irritable bowel syndrome   . Migraine    "used to be severe; now maybe 1/year or 2" (03/08/2017)  . Pneumonia    H!N!    Past Surgical History:  Procedure Laterality Date  . ANKLE HARDWARE REMOVAL Left 07/27/2017   Hardware removal left ankle, takedown non-union with allograft bone graft, Intramedullary Nail (Left)  . APPENDECTOMY    . AUGMENTATION MAMMAPLASTY Bilateral   . BRACHIOPLASTY Bilateral    Arm Lift   . CARDIAC CATHETERIZATION    . CHOLECYSTECTOMY OPEN    . COLON SURGERY    . EXTERNAL FIXATION LEG Left 03/08/2017   Procedure: INTERNAL FIXATION LEFT ANKLE;  Surgeon: Newt Minion, MD;  Location: [place];  Service: Orthopedics;  Laterality: Left;  . EYE MUSCLE SURGERY Bilateral X 2   "lazy eye; OR twice on each eye; Dr. Annamaria Boots"  . FRACTURE  SURGERY    . HARDWARE REMOVAL Left 07/27/2017   Procedure: HARDWARE REMOVAL;  Surgeon: Mcarthur Rossetti, MD;  Location: [place];  Service: Orthopedics;  Laterality: Left;  . LEFT HEART CATH AND CORONARY ANGIOGRAPHY N/A 10/27/2016   Procedure: Left Heart Cath and Coronary Angiography;  Surgeon: Peter M Martinique, MD;  Location: [place] [facility];  Service: Cardiovascular;  Laterality: N/A;  . LEFT HEART CATHETERIZATION WITH CORONARY ANGIOGRAM N/A 08/16/2014   Procedure: LEFT HEART CATHETERIZATION WITH CORONARY ANGIOGRAM;  Surgeon: Sinclair Grooms, MD;  Location: [facility] CATH LAB;  Service: Cardiovascular;  Laterality: N/A;  . NASAL SINUS SURGERY    . ORIF ANKLE FRACTURE Left 03/08/2017  . ORIF ANKLE FRACTURE Left 07/27/2017   Procedure: Hardware removal left ankle, takedown non-union with allograft bone graft, Intramedullary Nail;  Surgeon: Mcarthur Rossetti, MD;  Location: [place];  Service: Orthopedics;  Laterality: Left;  . PARTIAL COLECTOMY  1970s  . TONSILLECTOMY      There were no vitals filed for this visit.  Subjective Assessment - 11/29/17 1022    Subjective  My knee always hurts and makes my back hurt from limping.      Patient Stated Goals  walk comfortably  2-3 miles, hike    Currently in Pain?  Yes    Pain Score  5     Pain Location  Back and Lt knee    Pain Type  Chronic pain    Pain Onset  More than a month ago    Pain Frequency  Constant    Aggravating Factors   screw in Lt knee; standing and walking    Pain Relieving Factors  lying down supine         [facility] PT Assessment - 11/29/17 1023      6 Minute Walk- Baseline   6 Minute Walk- Baseline  yes    BP (mmHg)  110/76    HR (bpm)  84    Modified Borg Scale for Dyspnea  0- Nothing at all    Perceived Rate of Exertion (Borg)  6-      6 Minute walk- Post Test   6 Minute Walk Post Test  yes    BP (mmHg)  108/80    HR (bpm)  84    Modified Borg Scale for Dyspnea  0- Nothing at all    Perceived Rate of Exertion  (Borg)  13- Somewhat hard      6 minute walk test results    Aerobic Endurance Distance Walked  924    Endurance additional comments  RPE "somewhat hard" due to pain in LLE      Berg Balance Test   Sit to Stand  Able to stand without using hands and stabilize independently    Standing Unsupported  Able to stand safely 2 minutes    Sitting with Back Unsupported but Feet Supported on Floor or Stool  Able to sit safely and securely 2 minutes    Stand to Sit  Sits safely with minimal use of hands    Transfers  Able to transfer safely, minor use of hands    Standing Unsupported with Eyes Closed  Able to stand 10 seconds safely    Standing Ubsupported with Feet Together  Able to place feet together independently and stand 1 minute safely    From Standing, Reach Forward with Outstretched Arm  Can reach forward >12 cm safely (5")    From Standing Position, Pick up Object from Floor  Able to pick up shoe, needs supervision    From Standing Position, Turn to Look Behind Over each Shoulder  Looks behind one side only/other side shows less weight shift    Turn 360 Degrees  Able to turn 360 degrees safely but slowly    Standing Unsupported, Alternately Place Feet on Step/Stool  Able to stand independently and safely and complete 8 steps in 20 seconds    Standing Unsupported, One Foot in Front  Able to place foot tandem independently and hold 30 seconds    Standing on One Leg  Tries to lift leg/unable to hold 3 seconds but remains standing independently    Total Score  48                   [facility] Adult PT Treatment/Exercise - 11/29/17 1056      Knee/Hip Exercises: Aerobic   Nustep  L 1 7 min                PT Short Term Goals - 11/19/17 1725      PT SHORT TERM GOAL #1   Title  The patient will demonstrate knowledge of safe back movements with osteoporosis and appropriate basic back  exercises.    Time  4    Period  Weeks    Status  New    Target Date  12/17/17      PT  SHORT TERM GOAL #2   Title  The patient will report a 40% reduction in lateral hip pain     Time  4    Period  Weeks    Status  New      PT SHORT TERM GOAL #3   Title  The patient will have HS length of 75 degrees needed for more normalized stride length    Time  4    Period  Weeks    Status  New      PT SHORT TERM GOAL #4   Title  The patient will have gait speed with Timed up and Go of 12 sec or less closer to normal range for age    Time  4    Period  Weeks    Status  New      PT SHORT TERM GOAL #5   Title  The patient will have improved core and LE strength needed to walk 20 minutes with minimal to moderate pain    Time  4    Period  Weeks    Status  New        PT Long Term Goals - 11/19/17 1742      PT LONG TERM GOAL #1   Title  The patient will be independent in safe, self progression of HEP needed for further pain reduction and strength    Time  8    Period  Weeks    Status  New    Target Date  01/14/18      PT LONG TERM GOAL #2   Title  The patient will report a 60% improvement in back and hip pain    Time  8    Period  Weeks    Status  New      PT LONG TERM GOAL #3   Title  The patient will have improved core and hip strength to grossly 4/5 needed to rise from a standard chair without UE use    Time  8    Period  Weeks    Status  New      PT LONG TERM GOAL #4   Title  The patient will have improved gait speed to ambulate 600 feet in 6 minutes     Time  8    Period  Weeks    Status  New      PT LONG TERM GOAL #5   Title  FOTO functional outcome score improved from 52% limitation to 40% indicating improved function with less pain    Time  8    Period  Weeks    Status  New            Plan - 11/29/17 1057    Clinical Impression Statement  Pt scored a 48/56 on BERG indicating moderate fall risk and recommended to use cane outdoors.  Pt am 924' in 6MWT today without change in vitals.  Progressing well with PT.    Rehab Potential  Good     Clinical Impairments Affecting Rehab Potential  osteoporosis    PT Frequency  2x / week    PT Duration  8 weeks    PT Treatment/Interventions  ADLs/Self Care Home Management;Cryotherapy;Electrical Stimulation;Ultrasound;Moist Heat;Iontophoresis 4mg /ml Dexamethasone;Therapeutic activities;Therapeutic exercise;Patient/family education;Neuromuscular re-education;Manual techniques;Taping;Dry needling    PT  Next Visit Plan  assess response to iontophoresis #1 to right hip;   DN to left gluteals; core strengthening;  Nu-Step increase time;  left LE strengthening; retro stepping, stand tall gluteal ex    Consulted and Agree with Plan of Care  Patient       Patient will benefit from skilled therapeutic intervention in order to improve the following deficits and impairments:  Abnormal gait, Pain, Increased muscle spasms, Increased fascial restricitons, Decreased activity tolerance, Decreased range of motion, Decreased strength, Difficulty walking, Impaired perceived functional ability  Visit Diagnosis: Chronic bilateral low back pain without sciatica  Muscle weakness (generalized)  Difficulty in walking, not elsewhere classified  Pain in left hip  Pain in right hip     Problem List Patient Active Problem List   Diagnosis Date Noted  . 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 Open fracture of tibia and fibula, shaft, left, type I or II, with nonunion, subsequent encounter 07/21/2017  . Closed pilon fracture, left, initial encounter 03/08/2017  . Closed fracture of left fibula and tibia   . Chest pain 10/27/2016  . Unstable angina ([place])   . CAD (coronary artery disease), native coronary artery   . Anxiety   . Irritable bowel syndrome   . Depression   . Hyperlipidemia   . History of migraines       Laureen Abrahams, PT, DPT 11/29/17 10:58 AM    [facility] [address] [place], [place], [address] Phone: [phone]   Fax:  [phone]  Name: EMANUEL CAMPOS MRN: [id number] Date of Birth: [birth year]

## 2017-12-03 ENCOUNTER — Encounter: Payer: Self-pay | Admitting: Physical Therapy

## 2017-12-03 ENCOUNTER — Ambulatory Visit: Payer: Medicare Other | Admitting: Physical Therapy

## 2017-12-03 DIAGNOSIS — M545 Low back pain, unspecified: Secondary | ICD-10-CM

## 2017-12-03 DIAGNOSIS — M25552 Pain in left hip: Secondary | ICD-10-CM

## 2017-12-03 DIAGNOSIS — M6281 Muscle weakness (generalized): Secondary | ICD-10-CM

## 2017-12-03 DIAGNOSIS — G8929 Other chronic pain: Secondary | ICD-10-CM

## 2017-12-03 DIAGNOSIS — R262 Difficulty in walking, not elsewhere classified: Secondary | ICD-10-CM

## 2017-12-03 DIAGNOSIS — M25551 Pain in right hip: Secondary | ICD-10-CM

## 2017-12-03 NOTE — Therapy (Signed)
[facility] [address], [place], [place], [address] Phone: [phone]   Fax:  [phone]  Physical Therapy Treatment  Patient Details  Name: [name] MRN: [id number] Date of Birth: [birth year] Referring Provider: Dr. Ninfa Linden   Encounter Date: 12/03/2017  PT End of Session - 12/03/17 1217    Visit Number  4    Date for PT Re-Evaluation  01/14/18    Authorization Type  Medicare    PT Start Time  2633    PT Stop Time  1015    PT Time Calculation (min)  41 min    Activity Tolerance  Patient tolerated treatment well       Past Medical History:  Diagnosis Date  . Anxiety   . Arthritis    "back, neck" (03/08/2017)  . Asthma in child   . Bowel obstruction ([place])   . CAD (coronary artery disease), native coronary artery   . Chronic lower back pain   . Chronic neck pain   . Closed left tibial fracture   . Colon polyps   . Complication of anesthesia    "I was awake during one of my surgeries" reports that she felt them start the incision and heard them talking reports happened ~ 35 years ago    . Depression   . Diverticulosis   . Hyperlipidemia   . Irritable bowel syndrome   . Migraine    "used to be severe; now maybe 1/year or 2" (03/08/2017)  . Pneumonia    H!N!    Past Surgical History:  Procedure Laterality Date  . ANKLE HARDWARE REMOVAL Left 07/27/2017   Hardware removal left ankle, takedown non-union with allograft bone graft, Intramedullary Nail (Left)  . APPENDECTOMY    . AUGMENTATION MAMMAPLASTY Bilateral   . BRACHIOPLASTY Bilateral    Arm Lift   . CARDIAC CATHETERIZATION    . CHOLECYSTECTOMY OPEN    . COLON SURGERY    . EXTERNAL FIXATION LEG Left 03/08/2017   Procedure: INTERNAL FIXATION LEFT ANKLE;  Surgeon: Newt Minion, MD;  Location: [place];  Service: Orthopedics;  Laterality: Left;  . EYE MUSCLE SURGERY Bilateral X 2   "lazy eye; OR twice on each eye; Dr. Annamaria Boots"  . FRACTURE  SURGERY    . HARDWARE REMOVAL Left 07/27/2017   Procedure: HARDWARE REMOVAL;  Surgeon: Mcarthur Rossetti, MD;  Location: [place];  Service: Orthopedics;  Laterality: Left;  . LEFT HEART CATH AND CORONARY ANGIOGRAPHY N/A 10/27/2016   Procedure: Left Heart Cath and Coronary Angiography;  Surgeon: Peter M Martinique, MD;  Location: [place] [facility];  Service: Cardiovascular;  Laterality: N/A;  . LEFT HEART CATHETERIZATION WITH CORONARY ANGIOGRAM N/A 08/16/2014   Procedure: LEFT HEART CATHETERIZATION WITH CORONARY ANGIOGRAM;  Surgeon: Sinclair Grooms, MD;  Location: [facility] CATH LAB;  Service: Cardiovascular;  Laterality: N/A;  . NASAL SINUS SURGERY    . ORIF ANKLE FRACTURE Left 03/08/2017  . ORIF ANKLE FRACTURE Left 07/27/2017   Procedure: Hardware removal left ankle, takedown non-union with allograft bone graft, Intramedullary Nail;  Surgeon: Mcarthur Rossetti, MD;  Location: [place];  Service: Orthopedics;  Laterality: Left;  . PARTIAL COLECTOMY  1970s  . TONSILLECTOMY      There were no vitals filed for this visit.  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  He said I  could  get 1 injection per year so I'm going to wait until Winter.  My husband thinks I'm walking better.      How long can you stand comfortably?  want to be out of pain enough to walk comfortably 2-3 miles/day, hiking; whatever is realistic for the knee    Currently in Pain?  Yes    Pain Score  2     Pain Location  Knee    Multiple Pain Sites  Yes    Pain Score  4    Pain Location  Back    Aggravating Factors   mornings    Pain Relieving Factors  exercises in the morning                       [facility] Adult PT Treatment/Exercise - 12/03/17 0001      Therapeutic Activites    ADL's  walking, standing, stairs;  appropriate footwear      Neuro Re-ed    Neuro Re-ed Details   transverse abdominus, quad and  gluteal muscle activation       Lumbar Exercises: Seated   Other Seated Lumbar Exercises  foam roll push down 15x      Knee/Hip Exercises: Aerobic   Nustep  L1 12 min Seat 6 while discussing status;  education on osteoporosis vs.OA      Knee/Hip Exercises: Standing   Walking with Sports Cord  20# backward 10x    Other Standing Knee Exercises  retro stepping 20x right/left    Other Standing Knee Exercises  stand tall glute med ex 15x right/left      Iontophoresis   Type of Iontophoresis  Dexamethasone    Location  lumbar spine    Dose  4 mg/ml    Time  4 hour patch               PT Short Term Goals - 11/19/17 1725      PT SHORT TERM GOAL #1   Title  The patient will demonstrate knowledge of safe back movements with osteoporosis and appropriate basic back  exercises.    Time  4    Period  Weeks    Status  New    Target Date  12/17/17      PT SHORT TERM GOAL #2   Title  The patient will report a 40% reduction in lateral hip pain     Time  4    Period  Weeks    Status  New      PT SHORT TERM GOAL #3   Title  The patient will have HS length of 75 degrees needed for more normalized stride length    Time  4    Period  Weeks    Status  New      PT SHORT TERM GOAL #4   Title  The patient will have gait speed with Timed up and Go of 12 sec or less closer to normal range for age    Time  4    Period  Weeks    Status  New      PT SHORT TERM GOAL #5   Title  The patient will have improved core and LE strength needed to walk 20 minutes with minimal to moderate pain    Time  4    Period  Weeks    Status  New        PT Long Term Goals - 11/19/17 1742  PT LONG TERM GOAL #1   Title  The patient will be independent in safe, self progression of HEP needed for further pain reduction and strength    Time  8    Period  Weeks    Status  New    Target Date  01/14/18      PT LONG TERM GOAL #2   Title  The patient will report a 60% improvement in back and hip pain     Time  8    Period  Weeks    Status  New      PT LONG TERM GOAL #3   Title  The patient will have improved core and hip strength to grossly 4/5 needed to rise from a standard chair without UE use    Time  8    Period  Weeks    Status  New      PT LONG TERM GOAL #4   Title  The patient will have improved gait speed to ambulate 600 feet in 6 minutes     Time  8    Period  Weeks    Status  New      PT LONG TERM GOAL #5   Title  FOTO functional outcome score improved from 52% limitation to 40% indicating improved function with less pain    Time  8    Period  Weeks    Status  New            Plan - 12/03/17 1217    Clinical Impression Statement  The patient has improved stance time on left but has excessive pronation which may contribute to knee, hip and back pain.  Recommend a stability shoe with medial arch support.  She reports she "feels the screw" in the knee with standing exercises but it is "tolerable".  Patient education on osteoporosis and osteoarthritis which both apply to her.  Therapist closely monitoring response for modifications as needed.      Rehab Potential  Good    Clinical Impairments Affecting Rehab Potential  osteoporosis    PT Frequency  2x / week    PT Duration  8 weeks    PT Treatment/Interventions  ADLs/Self Care Home Management;Cryotherapy;Electrical Stimulation;Ultrasound;Moist Heat;Iontophoresis 4mg /ml Dexamethasone;Therapeutic activities;Therapeutic exercise;Patient/family education;Neuromuscular re-education;Manual techniques;Taping;Dry needling    PT Next Visit Plan  assess response to iontophoresis #2;   DN to left gluteals; core strengthening;  Nu-Step increase time;  left LE strengthening;         Patient will benefit from skilled therapeutic intervention in order to improve the following deficits and impairments:  Abnormal gait, Pain, Increased muscle spasms, Increased fascial restricitons, Decreased activity tolerance, Decreased range of  motion, Decreased strength, Difficulty walking, Impaired perceived functional ability  Visit Diagnosis: Chronic bilateral low back pain without sciatica  Muscle weakness (generalized)  Difficulty in walking, not elsewhere classified  Pain in left hip  Pain in right hip     Problem List Patient Active Problem List   Diagnosis Date Noted  . 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 Open fracture of tibia and fibula, shaft, left, type I or II, with nonunion, subsequent encounter 07/21/2017  . Closed pilon fracture, left, initial encounter 03/08/2017  . Closed fracture of left fibula and tibia   . Chest pain 10/27/2016  . Unstable angina ([place])   . CAD (coronary artery disease), native coronary artery   . Anxiety   . Irritable bowel syndrome   .  Depression   . Hyperlipidemia   . History of migraines    Ruben Im, PT 12/03/17 12:23 PM Phone: [phone] Fax: [phone]  Alvera Singh 12/03/2017, 12:23 PM  [facility] [address], [place], [place], [address] Phone: [phone]   Fax:  [phone]  Name: KALLEY NICHOLL MRN: [id number] Date of Birth: 1946-03-18

## 2017-12-07 ENCOUNTER — Ambulatory Visit: Payer: Medicare Other | Admitting: Physical Therapy

## 2017-12-07 ENCOUNTER — Encounter: Payer: Self-pay | Admitting: Physical Therapy

## 2017-12-07 DIAGNOSIS — M6281 Muscle weakness (generalized): Secondary | ICD-10-CM

## 2017-12-07 DIAGNOSIS — R262 Difficulty in walking, not elsewhere classified: Secondary | ICD-10-CM

## 2017-12-07 DIAGNOSIS — M545 Low back pain, unspecified: Secondary | ICD-10-CM

## 2017-12-07 DIAGNOSIS — G8929 Other chronic pain: Secondary | ICD-10-CM

## 2017-12-07 DIAGNOSIS — M25552 Pain in left hip: Secondary | ICD-10-CM

## 2017-12-07 DIAGNOSIS — M25551 Pain in right hip: Secondary | ICD-10-CM

## 2017-12-07 NOTE — Therapy (Signed)
[facility] [address] [place], [place], [address] Phone: [phone]   Fax:  [phone]  Physical Therapy Treatment  Patient Details  Name: TRIS HOWELL MRN: [id number] Date of Birth: 1945/08/18 Referring Provider: Dr. Ninfa Linden   Encounter Date: 12/07/2017  PT End of Session - 12/07/17 1236    Visit Number  5    Date for PT Re-Evaluation  01/14/18    Authorization Type  Medicare    PT Start Time  1150    PT Stop Time  1230    PT Time Calculation (min)  40 min    Activity Tolerance  Patient tolerated treatment well       Past Medical History:  Diagnosis Date  . Anxiety   . Arthritis    "back, neck" (03/08/2017)  . Asthma in child   . Bowel obstruction ([place])   . CAD (coronary artery disease), native coronary artery   . Chronic lower back pain   . Chronic neck pain   . Closed left tibial fracture   . Colon polyps   . Complication of anesthesia    "I was awake during one of my surgeries" reports that she felt them start the incision and heard them talking reports happened ~ 35 years ago    . Depression   . Diverticulosis   . Hyperlipidemia   . Irritable bowel syndrome   . Migraine    "used to be severe; now maybe 1/year or 2" (03/08/2017)  . Pneumonia    H!N!    Past Surgical History:  Procedure Laterality Date  . ANKLE HARDWARE REMOVAL Left 07/27/2017   Hardware removal left ankle, takedown non-union with allograft bone graft, Intramedullary Nail (Left)  . APPENDECTOMY    . AUGMENTATION MAMMAPLASTY Bilateral   . BRACHIOPLASTY Bilateral    Arm Lift   . CARDIAC CATHETERIZATION    . CHOLECYSTECTOMY OPEN    . COLON SURGERY    . EXTERNAL FIXATION LEG Left 03/08/2017   Procedure: INTERNAL FIXATION LEFT ANKLE;  Surgeon: Newt Minion, MD;  Location: [place];  Service: Orthopedics;  Laterality: Left;  . EYE MUSCLE SURGERY Bilateral X 2   "lazy eye; OR twice on each eye; Dr. Annamaria Boots"  . FRACTURE  SURGERY    . HARDWARE REMOVAL Left 07/27/2017   Procedure: HARDWARE REMOVAL;  Surgeon: Mcarthur Rossetti, MD;  Location: [place];  Service: Orthopedics;  Laterality: Left;  . LEFT HEART CATH AND CORONARY ANGIOGRAPHY N/A 10/27/2016   Procedure: Left Heart Cath and Coronary Angiography;  Surgeon: Peter M Martinique, MD;  Location: [place] [facility];  Service: Cardiovascular;  Laterality: N/A;  . LEFT HEART CATHETERIZATION WITH CORONARY ANGIOGRAM N/A 08/16/2014   Procedure: LEFT HEART CATHETERIZATION WITH CORONARY ANGIOGRAM;  Surgeon: Sinclair Grooms, MD;  Location: [facility] CATH LAB;  Service: Cardiovascular;  Laterality: N/A;  . NASAL SINUS SURGERY    . ORIF ANKLE FRACTURE Left 03/08/2017  . ORIF ANKLE FRACTURE Left 07/27/2017   Procedure: Hardware removal left ankle, takedown non-union with allograft bone graft, Intramedullary Nail;  Surgeon: Mcarthur Rossetti, MD;  Location: [place];  Service: Orthopedics;  Laterality: Left;  . PARTIAL COLECTOMY  1970s  . TONSILLECTOMY      There were no vitals filed for this visit.  Subjective Assessment - 12/07/17 1150    Subjective  Patient wearing her new shoes from [employer].  My back feels a whole lot better.  The pain patch really helped.  My back is 80% better.      Pertinent History  Dr. Trevor Mace office visit note 11/11/17:  This point we will send her outpatient physical therapy mainly to work on bilateral lower extremity strengthening as well as her balance and coordination.  I like to work on her low back in the trochanteric area of the hips with any modalities.  We will see her back in 3 months with a repeat AP and lateral of the left tibia.    Currently in Pain?  Yes    Pain Score  2     Pain Location  Knee    Pain Orientation  Left    Pain Score  2    Pain Location  Back    Pain Type  Chronic pain                       [facility] Adult PT Treatment/Exercise - 12/07/17 0001      Therapeutic Activites    ADL's  walking,  standing, stairs;  appropriate footwear      Neuro Re-ed    Neuro Re-ed Details   transverse abdominus, quad and gluteal muscle activation ; gluteals and quad activation      Knee/Hip Exercises: Aerobic   Nustep  L 2 10 min      Knee/Hip Exercises: Standing   Hip Abduction  Stengthening;Right;Left;15 reps    Abduction Limitations  red band    Hip Extension  Stengthening;Right;Left;10 reps    Extension Limitations  red band    Forward Step Up  Left;15 reps;Hand Hold: 2    SLS with Vectors  tandem stand toe touch with UE green band diagonals 10x right/left    Gait Training  up and down steps reciprocally 2 railing support    Other Standing Knee Exercises  green band sidestepping and monster walks     Other Standing Knee Exercises  stand tall glute med ex 15x right/left      Iontophoresis   Type of Iontophoresis  Dexamethasone    Location  lumbar spine    Dose  4 mg/ml    Time  4 hour patch      Ankle Exercises: Seated   Other Seated Ankle Exercises  great toe abduction passive, toe stretch and soft tissue to interossei                PT Short Term Goals - 11/19/17 1725      PT SHORT TERM GOAL #1   Title  The patient will demonstrate knowledge of safe back movements with osteoporosis and appropriate basic back  exercises.    Time  4    Period  Weeks    Status  New    Target Date  12/17/17      PT SHORT TERM GOAL #2   Title  The patient will report a 40% reduction in lateral hip pain     Time  4    Period  Weeks    Status  New      PT SHORT TERM GOAL #3   Title  The patient will have HS length of 75 degrees needed for more normalized stride length    Time  4    Period  Weeks    Status  New      PT SHORT TERM GOAL #4   Title  The patient will have gait speed with Timed up and Go of 12 sec or less closer to normal  range for age    Time  4    Period  Weeks    Status  New      PT SHORT TERM GOAL #5   Title  The patient will have improved core and LE strength  needed to walk 20 minutes with minimal to moderate pain    Time  4    Period  Weeks    Status  New        PT Long Term Goals - 11/19/17 1742      PT LONG TERM GOAL #1   Title  The patient will be independent in safe, self progression of HEP needed for further pain reduction and strength    Time  8    Period  Weeks    Status  New    Target Date  01/14/18      PT LONG TERM GOAL #2   Title  The patient will report a 60% improvement in back and hip pain    Time  8    Period  Weeks    Status  New      PT LONG TERM GOAL #3   Title  The patient will have improved core and hip strength to grossly 4/5 needed to rise from a standard chair without UE use    Time  8    Period  Weeks    Status  New      PT LONG TERM GOAL #4   Title  The patient will have improved gait speed to ambulate 600 feet in 6 minutes     Time  8    Period  Weeks    Status  New      PT LONG TERM GOAL #5   Title  FOTO functional outcome score improved from 52% limitation to 40% indicating improved function with less pain    Time  8    Period  Weeks    Status  New            Plan - 12/07/17 1236    Clinical Impression Statement  Gait pattern much improved with her new [employer] shoes she just purchased.  Good arch support and stability with much less overpronation noted.  Improving gluteal muscle activation noted with single leg standing.  Back pain is minimal during exercises despite a progression of intensity.  Therapist providing close supervision for safety and to monitor response.      Rehab Potential  Good    Clinical Impairments Affecting Rehab Potential  osteoporosis    PT Frequency  2x / week    PT Duration  8 weeks    PT Treatment/Interventions  ADLs/Self Care Home Management;Cryotherapy;Electrical Stimulation;Ultrasound;Moist Heat;Iontophoresis 4mg /ml Dexamethasone;Therapeutic activities;Therapeutic exercise;Patient/family education;Neuromuscular re-education;Manual techniques;Taping;Dry needling     PT Next Visit Plan  assess response to iontophoresis #4;  core strengthening;  Nu-Step;  left LE strengthening;  recheck TUG       Patient will benefit from skilled therapeutic intervention in order to improve the following deficits and impairments:  Abnormal gait, Pain, Increased muscle spasms, Increased fascial restricitons, Decreased activity tolerance, Decreased range of motion, Decreased strength, Difficulty walking, Impaired perceived functional ability  Visit Diagnosis: Chronic bilateral low back pain without sciatica  Muscle weakness (generalized)  Difficulty in walking, not elsewhere classified  Pain in left hip  Pain in right hip     Problem List Patient Active Problem List   Diagnosis Date Noted  . Closed disp comminuted fracture of shaft of left  tibia with nonunion 07/27/2017  . Open fracture of tibia and fibula, shaft, left, type I or II, with nonunion, subsequent encounter 07/21/2017  . Closed pilon fracture, left, initial encounter 03/08/2017  . Closed fracture of left fibula and tibia   . Chest pain 10/27/2016  . Unstable angina ([place])   . CAD (coronary artery disease), native coronary artery   . Anxiety   . Irritable bowel syndrome   . Depression   . Hyperlipidemia   . History of migraines    Ruben Im, PT 12/07/17 12:43 PM Phone: [phone] Fax: [phone]  Alvera Singh 12/07/2017, 12:43 PM  [facility] [address], [place], [place], [address] Phone: [phone]   Fax:  [phone]  Name: LIESEL PECKENPAUGH MRN: [id number] Date of Birth: [birth year]

## 2017-12-10 ENCOUNTER — Ambulatory Visit: Payer: Medicare Other | Admitting: Physical Therapy

## 2017-12-10 ENCOUNTER — Encounter: Payer: Self-pay | Admitting: Physical Therapy

## 2017-12-10 DIAGNOSIS — M545 Low back pain, unspecified: Secondary | ICD-10-CM

## 2017-12-10 DIAGNOSIS — M6281 Muscle weakness (generalized): Secondary | ICD-10-CM

## 2017-12-10 DIAGNOSIS — M25551 Pain in right hip: Secondary | ICD-10-CM

## 2017-12-10 DIAGNOSIS — R262 Difficulty in walking, not elsewhere classified: Secondary | ICD-10-CM

## 2017-12-10 DIAGNOSIS — M25552 Pain in left hip: Secondary | ICD-10-CM

## 2017-12-10 DIAGNOSIS — G8929 Other chronic pain: Secondary | ICD-10-CM

## 2017-12-10 NOTE — Therapy (Signed)
[facility] [address], [place], [place], [address] Phone: [phone]   Fax:  [phone]  Physical Therapy Treatment  Patient Details  Name: [name] MRN: [id number] Date of Birth: 04-18-1946 Referring Provider: Dr. Ninfa Linden   Encounter Date: 12/10/2017  PT End of Session - 12/10/17 1008    Visit Number  6    Date for PT Re-Evaluation  01/14/18    Authorization Type  Medicare    PT Start Time  [phone] pt late    PT Stop Time  1004 needs to leave early    PT Time Calculation (min)  26 min    Activity Tolerance  Patient tolerated treatment well       Past Medical History:  Diagnosis Date  . Anxiety   . Arthritis    "back, neck" (03/08/2017)  . Asthma in child   . Bowel obstruction ([place])   . CAD (coronary artery disease), native coronary artery   . Chronic lower back pain   . Chronic neck pain   . Closed left tibial fracture   . Colon polyps   . Complication of anesthesia    "I was awake during one of my surgeries" reports that she felt them start the incision and heard them talking reports happened ~ 35 years ago    . Depression   . Diverticulosis   . Hyperlipidemia   . Irritable bowel syndrome   . Migraine    "used to be severe; now maybe 1/year or 2" (03/08/2017)  . Pneumonia    H!N!    Past Surgical History:  Procedure Laterality Date  . ANKLE HARDWARE REMOVAL Left 07/27/2017   Hardware removal left ankle, takedown non-union with allograft bone graft, Intramedullary Nail (Left)  . APPENDECTOMY    . AUGMENTATION MAMMAPLASTY Bilateral   . BRACHIOPLASTY Bilateral    Arm Lift   . CARDIAC CATHETERIZATION    . CHOLECYSTECTOMY OPEN    . COLON SURGERY    . EXTERNAL FIXATION LEG Left 03/08/2017   Procedure: INTERNAL FIXATION LEFT ANKLE;  Surgeon: Newt Minion, MD;  Location: [facility];  Service: Orthopedics;  Laterality: Left;  . EYE MUSCLE SURGERY Bilateral X 2   "lazy eye; OR twice on each eye;  Dr. Annamaria Boots"  . FRACTURE SURGERY    . HARDWARE REMOVAL Left 07/27/2017   Procedure: HARDWARE REMOVAL;  Surgeon: Mcarthur Rossetti, MD;  Location: [facility];  Service: Orthopedics;  Laterality: Left;  . LEFT HEART CATH AND CORONARY ANGIOGRAPHY N/A 10/27/2016   Procedure: Left Heart Cath and Coronary Angiography;  Surgeon: Peter M Martinique, MD;  Location: [facility];  Service: Cardiovascular;  Laterality: N/A;  . LEFT HEART CATHETERIZATION WITH CORONARY ANGIOGRAM N/A 08/16/2014   Procedure: LEFT HEART CATHETERIZATION WITH CORONARY ANGIOGRAM;  Surgeon: Sinclair Grooms, MD;  Location: [facility] CATH [facility];  Service: Cardiovascular;  Laterality: N/A;  . NASAL SINUS SURGERY    . ORIF ANKLE FRACTURE Left 03/08/2017  . ORIF ANKLE FRACTURE Left 07/27/2017   Procedure: Hardware removal left ankle, takedown non-union with allograft bone graft, Intramedullary Nail;  Surgeon: Mcarthur Rossetti, MD;  Location: [facility];  Service: Orthopedics;  Laterality: Left;  . PARTIAL COLECTOMY  1970s  . TONSILLECTOMY      There were no vitals filed for this visit.  Subjective Assessment - 12/10/17 0937    Subjective  Patient arrives late and needs to leave early for her grandson's awards ceremony.  I'm limping  today b/c of that screw in my knee.  My back is so much better.  I did well after last time and went shopping for about 3 hours the other day.  I can go up and down the steps reciprocally now but slowly.      Pertinent History  Dr. Trevor Mace office visit note 11/11/17:  This point we will send her outpatient physical therapy mainly to work on bilateral lower extremity strengthening as well as her balance and coordination.  I like to work on her low back in the trochanteric area of the hips with any modalities.  We will see her back in 3 months with a repeat AP and lateral of the left tibia.    Currently in Pain?  Yes    Pain Score  3     Pain Location  Knee    Pain Type  Chronic pain    Aggravating Factors   screw  in knee bothers me when I walk          [facility] PT Assessment - 12/10/17 0001      Timed Up and Go Test   Normal TUG (seconds)  12.62                   [facility] Adult PT Treatment/Exercise - 12/10/17 0001      Therapeutic Activites    ADL's  walking, standing, stairs;  appropriate footwear      Neuro Re-ed    Neuro Re-ed Details   transverse abdominus, quad and gluteal muscle activation ; gluteals and quad activation      Knee/Hip Exercises: Aerobic   Nustep  L3 6 min       Knee/Hip Exercises: Standing   Forward Step Up  Right;Left;5 sets with opposite hip abduction     Rebounder  3 way weight shift 1 min each    Other Standing Knee Exercises  floor sliders 4 directions 5x each right/left      Iontophoresis   Type of Iontophoresis  Dexamethasone    Location  lumbar spine    Dose  4 mg/ml    Time  4 hour patch               PT Short Term Goals - 12/10/17 1015      PT SHORT TERM GOAL #1   Title  The patient will demonstrate knowledge of safe back movements with osteoporosis and appropriate basic back  exercises.    Status  Achieved      PT SHORT TERM GOAL #2   Title  The patient will report a 40% reduction in lateral hip pain     Time  4    Period  Weeks    Status  Achieved      PT SHORT TERM GOAL #3   Title  The patient will have HS length of 75 degrees needed for more normalized stride length    Time  4    Period  Weeks    Status  On-going      PT SHORT TERM GOAL #4   Title  The patient will have gait speed with Timed up and Go of 12 sec or less closer to normal range for age    Time  4    Period  Weeks    Status  Partially Met      PT SHORT TERM GOAL #5   Title  The patient will have improved core and LE strength needed to walk  20 minutes with minimal to moderate pain    Status  Achieved        PT Long Term Goals - 11/19/17 1742      PT LONG TERM GOAL #1   Title  The patient will be independent in safe, self progression of HEP  needed for further pain reduction and strength    Time  8    Period  Weeks    Status  New    Target Date  01/14/18      PT LONG TERM GOAL #2   Title  The patient will report a 60% improvement in back and hip pain    Time  8    Period  Weeks    Status  New      PT LONG TERM GOAL #3   Title  The patient will have improved core and hip strength to grossly 4/5 needed to rise from a standard chair without UE use    Time  8    Period  Weeks    Status  New      PT LONG TERM GOAL #4   Title  The patient will have improved gait speed to ambulate 600 feet in 6 minutes     Time  8    Period  Weeks    Status  New      PT LONG TERM GOAL #5   Title  FOTO functional outcome score improved from 52% limitation to 40% indicating improved function with less pain    Time  8    Period  Weeks    Status  New            Plan - 12/10/17 1012    Clinical Impression Statement  The patient has made excellent improvements in gait speed with Timed up and Go test 6 sec faster.  Improved stance time but with persistent limp secondary to knee pain.  LBP improving which she attributes to iontophoresis.  Improving function with longer community ambulation and agility to do stairs reciprocally.   She is considering travelling to the beach this weekend.   Therapist closely monitoring for safety and also to monitor for pain.  Good progress with STGs.      Rehab Potential  Good    Clinical Impairments Affecting Rehab Potential  osteoporosis    PT Frequency  2x / week    PT Duration  8 weeks    PT Treatment/Interventions  ADLs/Self Care Home Management;Cryotherapy;Electrical Stimulation;Ultrasound;Moist Heat;Iontophoresis 57m/ml Dexamethasone;Therapeutic activities;Therapeutic exercise;Patient/family education;Neuromuscular re-education;Manual techniques;Taping;Dry needling    PT Next Visit Plan   iontophoresis;  core strengthening;  Nu-Step;  left LE strengthening;         Patient will benefit from skilled  therapeutic intervention in order to improve the following deficits and impairments:  Abnormal gait, Pain, Increased muscle spasms, Increased fascial restricitons, Decreased activity tolerance, Decreased range of motion, Decreased strength, Difficulty walking, Impaired perceived functional ability  Visit Diagnosis: Chronic bilateral low back pain without sciatica  Muscle weakness (generalized)  Difficulty in walking, not elsewhere classified  Pain in left hip  Pain in right hip     Problem List Patient Active Problem List   Diagnosis Date Noted  . 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 Open fracture of tibia and fibula, shaft, left, type I or II, with nonunion, subsequent encounter 07/21/2017  . Closed pilon fracture, left, initial encounter 03/08/2017  . Closed fracture of left fibula  and tibia   . Chest pain 10/27/2016  . Unstable angina ([place])   . CAD (coronary artery disease), native coronary artery   . Anxiety   . Irritable bowel syndrome   . Depression   . Hyperlipidemia   . History of migraines    Ruben Im, PT 12/10/17 10:18 AM Phone: [phone] Fax: [phone]  Alvera Singh 12/10/2017, 10:17 AM  [facility] [address] [place], [place], [address] Phone: [phone]   Fax:  [phone]  Name: NIVEDITA MIRABELLA MRN: [id number] Date of Birth: 1945-08-13

## 2017-12-14 ENCOUNTER — Encounter: Payer: Medicare Other | Admitting: Physical Therapy

## 2017-12-15 ENCOUNTER — Encounter (INDEPENDENT_AMBULATORY_CARE_PROVIDER_SITE_OTHER): Payer: Self-pay | Admitting: Orthopaedic Surgery

## 2017-12-15 ENCOUNTER — Ambulatory Visit (INDEPENDENT_AMBULATORY_CARE_PROVIDER_SITE_OTHER): Payer: Medicare Other | Admitting: Orthopaedic Surgery

## 2017-12-15 ENCOUNTER — Ambulatory Visit (INDEPENDENT_AMBULATORY_CARE_PROVIDER_SITE_OTHER): Payer: Self-pay

## 2017-12-15 VITALS — Ht 62.0 in | Wt 145.0 lb

## 2017-12-15 DIAGNOSIS — S82402M Unspecified fracture of shaft of left fibula, subsequent encounter for open fracture type I or II with nonunion: Secondary | ICD-10-CM

## 2017-12-15 DIAGNOSIS — T8484XA Pain due to internal orthopedic prosthetic devices, implants and grafts, initial encounter: Secondary | ICD-10-CM | POA: Insufficient documentation

## 2017-12-15 DIAGNOSIS — M76822 Posterior tibial tendinitis, left leg: Secondary | ICD-10-CM | POA: Insufficient documentation

## 2017-12-15 DIAGNOSIS — S82202M Unspecified fracture of shaft of left tibia, subsequent encounter for open fracture type I or II with nonunion: Secondary | ICD-10-CM | POA: Diagnosis not present

## 2017-12-15 NOTE — Progress Notes (Signed)
The patient is now 5 months status post hardware removal from the left ankle from a broken plate with takedown of nonunion and intramedullary nail placement.  She has been doing well for a while but is been worsening in terms of her gait with posterior tibial tendon dysfunction.  She has been developing interning of her foot and ankle.  She has pain over the proximal interlocking screw from the intramedullary nail and also affecting her posture with upper back and neck pain as well.  On exam she has excellent range of motion of her left knee.  Her left ankle moves well.  She does have pain over the Achilles tendon but she is showing significant incompetence of the posterior tibial tendon on the left side.  This affects her with her gait and how she stands.  She does have pain over the proximal interlocking screw as well.  X-rays of the ankle weightbearing show that the ankle mortise is intact and the fracture appears to be healing.  At this point I will set her up for surgery to remove the proximal interlocking screw due to painful prominence of this.  We will also send her to [facility] for custom orthotics for the left lower extremity to treat her posterior tibial tendon dysfunction.  This will be a hindfoot wedge.  I would like to send her back to physical therapy to work on her posterior tibial tendon as well as any modalities to decrease her pain from her thoracic and neck as it relates to her gait.  We will call about setting her up for surgery for the removal of the proximal interlocking screw.  All question concerns were answered and addressed.

## 2017-12-17 ENCOUNTER — Encounter: Payer: Medicare Other | Admitting: Physical Therapy

## 2017-12-22 ENCOUNTER — Ambulatory Visit: Payer: Medicare Other | Attending: Orthopaedic Surgery

## 2017-12-22 DIAGNOSIS — M546 Pain in thoracic spine: Secondary | ICD-10-CM | POA: Diagnosis present

## 2017-12-22 DIAGNOSIS — M6281 Muscle weakness (generalized): Secondary | ICD-10-CM | POA: Insufficient documentation

## 2017-12-22 DIAGNOSIS — R262 Difficulty in walking, not elsewhere classified: Secondary | ICD-10-CM | POA: Diagnosis present

## 2017-12-22 DIAGNOSIS — M25572 Pain in left ankle and joints of left foot: Secondary | ICD-10-CM | POA: Diagnosis present

## 2017-12-22 DIAGNOSIS — M25551 Pain in right hip: Secondary | ICD-10-CM | POA: Diagnosis present

## 2017-12-22 DIAGNOSIS — M25552 Pain in left hip: Secondary | ICD-10-CM | POA: Diagnosis present

## 2017-12-22 DIAGNOSIS — M542 Cervicalgia: Secondary | ICD-10-CM | POA: Diagnosis present

## 2017-12-22 DIAGNOSIS — G8929 Other chronic pain: Secondary | ICD-10-CM | POA: Diagnosis present

## 2017-12-22 DIAGNOSIS — M545 Low back pain, unspecified: Secondary | ICD-10-CM

## 2017-12-22 NOTE — Patient Instructions (Signed)
Access Code: [email or web address]  URL: [email or web address]  Date: 12/22/2017  Prepared by: Sigurd Sos   Exercises  Seated Cervical Flexion AROM - 10 reps - 3 sets - 1x daily - 7x weekly  Seated Cervical Sidebending AROM - 10 reps - 3 sets - 1x daily - 7x weekly  Seated Cervical Rotation AROM - 10 reps - 3 sets - 1x daily - 7x weekly  Seated Correct Posture - 10 reps - 3 sets - 1x daily - 7x weekly  Seated Toe Towel Scrunches - 10 reps - 3 sets - 1x daily - 7x weekly  Ankle Inversion Eversion Towel Slide - 10 reps - 3 sets - 1x daily - 7x weekly  Seated Great Toe Extension - 10 reps - 3 sets - 1x daily - 7x weekly  Seated Lesser Toes Extension - 10 reps - 3 sets - 1x daily - 7x weekly  Seated Heel Raise - 10 reps - 3 sets - 1x daily - 7x weekly  Seated Marble Pick-Up with Toes - 10 reps - 3 sets - 1x daily - 7x weekly   Cervico-Thoracic: Extension / Rotation (Sitting)    Reach across body with left arm and grasp back of chair. Gently look over right side shoulder. Hold _20___ seconds. Relax. Repeat __3__ times per set. Do _1___ sets per session. Do _3___ sessions per day.  http://orth.exer.us/981   Copyright  VHI. All rights reserved.   [facility] [address], [place], [place] [address] Phone # [phone] Fax [phone]

## 2017-12-22 NOTE — Therapy (Addendum)
[facility] [address], [place], [place], [address] Phone: [phone]   Fax:  [phone]  Physical Therapy Treatment  Patient Details  Name: [name] MRN: [id number] Date of Birth: February 04, 1946 Referring Provider: Cooper Render   Encounter Date: 12/22/2017  PT End of Session - 12/22/17 1532    Visit Number  7    Date for PT Re-Evaluation  02/16/18    Authorization Type  Medicare    PT Start Time  1400    PT Stop Time  1446    PT Time Calculation (min)  46 min    Activity Tolerance  Patient tolerated treatment well    Behavior During Therapy  [facility] for tasks assessed/performed       Past Medical History:  Diagnosis Date  . Anxiety   . Arthritis    "back, neck" (03/08/2017)  . Asthma in child   . Bowel obstruction ([place])   . CAD (coronary artery disease), native coronary artery   . Chronic lower back pain   . Chronic neck pain   . Closed left tibial fracture   . Colon polyps   . Complication of anesthesia    "I was awake during one of my surgeries" reports that she felt them start the incision and heard them talking reports happened ~ 35 years ago    . Depression   . Diverticulosis   . Hyperlipidemia   . Irritable bowel syndrome   . Migraine    "used to be severe; now maybe 1/year or 2" (03/08/2017)  . Pneumonia    H!N!    Past Surgical History:  Procedure Laterality Date  . ANKLE HARDWARE REMOVAL Left 07/27/2017   Hardware removal left ankle, takedown non-union with allograft bone graft, Intramedullary Nail (Left)  . APPENDECTOMY    . AUGMENTATION MAMMAPLASTY Bilateral   . BRACHIOPLASTY Bilateral    Arm Lift   . CARDIAC CATHETERIZATION    . CHOLECYSTECTOMY OPEN    . COLON SURGERY    . EXTERNAL FIXATION LEG Left 03/08/2017   Procedure: INTERNAL FIXATION LEFT ANKLE;  Surgeon: Newt Minion, MD;  Location: [place];  Service: Orthopedics;  Laterality: Left;  . EYE MUSCLE SURGERY  Bilateral X 2   "lazy eye; OR twice on each eye; Dr. Annamaria Boots"  . FRACTURE SURGERY    . HARDWARE REMOVAL Left 07/27/2017   Procedure: HARDWARE REMOVAL;  Surgeon: Mcarthur Rossetti, MD;  Location: [place];  Service: Orthopedics;  Laterality: Left;  . LEFT HEART CATH AND CORONARY ANGIOGRAPHY N/A 10/27/2016   Procedure: Left Heart Cath and Coronary Angiography;  Surgeon: Peter M Martinique, MD;  Location: [facility];  Service: Cardiovascular;  Laterality: N/A;  . LEFT HEART CATHETERIZATION WITH CORONARY ANGIOGRAM N/A 08/16/2014   Procedure: LEFT HEART CATHETERIZATION WITH CORONARY ANGIOGRAM;  Surgeon: Sinclair Grooms, MD;  Location: [facility];  Service: Cardiovascular;  Laterality: N/A;  . NASAL SINUS SURGERY    . ORIF ANKLE FRACTURE Left 03/08/2017  . ORIF ANKLE FRACTURE Left 07/27/2017   Procedure: Hardware removal left ankle, takedown non-union with allograft bone graft, Intramedullary Nail;  Surgeon: Mcarthur Rossetti, MD;  Location: [place];  Service: Orthopedics;  Laterality: Left;  . PARTIAL COLECTOMY  1970s  . TONSILLECTOMY      There were no vitals filed for this visit.  Subjective Assessment - 12/22/17 1412    Subjective  New order from MD for Lt ankle pain/dysfunction and neck/thoracic pain.  I got fitted for orthotics and got shoes.    Currently in Pain?  Yes    Pain Score  3     Pain Location  Knee    Pain Orientation  Left    Pain Descriptors / Indicators  Aching    Pain Onset  More than a month ago    Pain Frequency  Constant    Aggravating Factors   it is always there due to pin in knee    Pain Relieving Factors  non weight bearing    Multiple Pain Sites  Yes    Pain Score  4    Pain Location  Back    Pain Orientation  Lower    Pain Descriptors / Indicators  Aching;Sore    Pain Type  Chronic pain    Pain Onset  More than a month ago    Pain Frequency  Constant    Aggravating Factors   morning, standing and walking    Pain Relieving Factors  exercises in the  morning    Pain Score  5    Pain Location  Ankle    Pain Orientation  Left    Pain Descriptors / Indicators  Aching;Sore    Pain Type  Chronic pain    Pain Onset  More than a month ago    Pain Frequency  Constant    Aggravating Factors   night time, certain shoes. standing    Pain Relieving Factors  non weightbearing    Pain Score  4    Pain Location  Neck    Pain Orientation  Left;Right    Pain Descriptors / Indicators  Aching;Sore    Pain Type  Chronic pain    Pain Onset  More than a month ago    Pain Frequency  Constant    Aggravating Factors   turning head, walking, sometimes random    Pain Relieving Factors  massages,          [facility] PT Assessment - 12/22/17 0001      Assessment   Medical Diagnosis  LBP with bilateral bursitis, Left posterior tibial tendon dysfunction, neck pain and thoracic pain    Referring Provider  Ninfa Linden, Christopher,MD    Onset Date/Surgical Date  07/26/17      Precautions   Precautions  None      Home Environment   Living Environment  Private residence    Living Arrangements  Spouse/significant other    Home Access  Stairs to enter    Home Layout  Two level    Alternate Level Stairs-Number of Steps  10      Prior Function   Level of Independence  Independent with basic ADLs    Vocation  Retired    Leisure  walk, ex, Veterinary surgeon   Overall Cognitive Status  Within Functional Limits for tasks assessed      ROM / Strength   AROM / PROM / Strength  AROM;PROM;Strength      AROM   Overall AROM   Deficits    AROM Assessment Site  Cervical;Ankle    Right/Left Ankle  Left;Right    Left Ankle Dorsiflexion  0    Left Ankle Plantar Flexion  45    Left Ankle Inversion  12    Left Ankle Eversion  35    Cervical Flexion  35    Cervical Extension  35    Cervical - Right Side Bend  45  Cervical - Left Side Bend  35    Cervical - Right Rotation  70    Cervical - Left Rotation  50      Strength   Overall Strength  Deficits     Overall Strength Comments  4+/5 UE strength, poor control of 2nd digit on Lt foot    Strength Assessment Site  Ankle    Right/Left Ankle  Left    Left Ankle Dorsiflexion  4/5    Left Ankle Inversion  4-/5    Left Ankle Eversion  4-/5      Palpation   Palpation comment  palpable tenderness over bil upper traps and thoracic musculature with trigger points      Ambulation/Gait   Ambulation/Gait  Yes    Gait Pattern  Step-through pattern;Decreased stance time - left;Decreased dorsiflexion - left;Decreased trunk rotation;Antalgic                           PT Education - 12/22/17 1438    Education provided  Yes    Education Details  foot A/ROM, cervical and thoracic A/ROM    Person(s) Educated  Patient    Methods  Explanation;Demonstration;Handout    Comprehension  Verbalized understanding;Returned demonstration       PT Short Term Goals - 12/10/17 1015      PT SHORT TERM GOAL #1   Title  The patient will demonstrate knowledge of safe back movements with osteoporosis and appropriate basic back  exercises.    Status  Achieved      PT SHORT TERM GOAL #2   Title  The patient will report a 40% reduction in lateral hip pain     Time  4    Period  Weeks    Status  Achieved      PT SHORT TERM GOAL #3   Title  The patient will have HS length of 75 degrees needed for more normalized stride length    Time  4    Period  Weeks    Status  On-going      PT SHORT TERM GOAL #4   Title  The patient will have gait speed with Timed up and Go of 12 sec or less closer to normal range for age    Time  4    Period  Weeks    Status  Partially Met      PT SHORT TERM GOAL #5   Title  The patient will have improved core and LE strength needed to walk 20 minutes with minimal to moderate pain    Status  Achieved        PT Long Term Goals - 12/22/17 1410      PT LONG TERM GOAL #1   Title  The patient will be independent in safe, self progression of HEP needed for further  pain reduction and strength    Time  8    Period  Weeks    Status  On-going    Target Date  02/16/18      PT LONG TERM GOAL #2   Title  The patient will report a 60% improvement in back and hip pain    Baseline  it really depends    Time  8    Period  Weeks    Status  On-going    Target Date  02/16/18      PT LONG TERM GOAL #3   Title  The patient will  have improved core and hip strength to grossly 4/5 needed to rise from a standard chair without UE use      PT LONG TERM GOAL #4   Title  The patient will have improved gait speed to ambulate 600 feet in 6 minutes     Time  8    Period  Weeks    Status  On-going    Target Date  02/16/18      PT LONG TERM GOAL #5   Title  FOTO functional outcome score improved from 52% limitation to 40% indicating improved function with less pain (low back)    Time  8    Period  Weeks    Status  On-going    Target Date  02/16/18      Additional Long Term Goals   Additional Long Term Goals  Yes      PT LONG TERM GOAL #6   Title  report a 40% reduction in cervical/thoracic pain with ADLs and self-care due to improve postural strength     Time  8    Period  Weeks    Status  New    Target Date  02/16/18      PT LONG TERM GOAL #7   Title  demonstrate 4+/5 Lt ankle strength to improve stability on unlevel surfaces    Time  8    Period  Weeks    Status  New    Target Date  02/16/18      PT LONG TERM GOAL #8   Title  report 60% reduction in Lt ankle pain with standing and walking    Time  8    Period  Weeks    Status  New    Target Date  02/16/18      PT LONG TERM GOAL  #9   TITLE  demonstrate Rt cervical A/ROM rotation to > or = to 70 degrees to improve safety with driving    Time  8    Period  Weeks    Status  New    Target Date  02/16/18            Plan - 12/22/17 1439    Clinical Impression Statement  Pt with new diagnoses including neck, thoracic and Lt ankle pain.  Pt had ORIF of Lt ankle with rod and screw placement  in 02/2017 and 07/2017.  Pt with gait abnormality and increased neck and thoracic pain due to this.  Pt demonstrates reduced strength in Lt ankle and painful A/ROM that is normal.  Pt with reduced mobility in Lt toes.   Pt with antalgic gait pattern on level surface with Lt ankle inversion.  Pt demonstrates limited cervical A/ROM and pain with this movement.  Pt with active trigger points in bil upper traps and thoracic musculature.  Pt will benefit from skilled PT for Lt ankle strength and stability, gait training, cervical and thoracic manual therapy, flexibility and postural strength and continued treatment for lumbar and hip pain.      Rehab Potential  Good    Clinical Impairments Affecting Rehab Potential  osteoporosis    PT Frequency  2x / week    PT Duration  8 weeks    PT Treatment/Interventions  ADLs/Self Care Home Management;Cryotherapy;Electrical Stimulation;Ultrasound;Moist Heat;Iontophoresis 25m/ml Dexamethasone;Therapeutic activities;Therapeutic exercise;Patient/family education;Neuromuscular re-education;Manual techniques;Taping;Dry needling    PT Next Visit Plan  Review HEP issued today for neck and foot.  manual therapy/dry needling to neck and thoracic spine, Lt foot/toe mobility,  ankle stability    PT Home Exercise Plan   Access Code: [email or web address]     Recommended Other Services  recert sent 01/19/61    Consulted and Agree with Plan of Care  Patient       Patient will benefit from skilled therapeutic intervention in order to improve the following deficits and impairments:  Abnormal gait, Pain, Increased muscle spasms, Increased fascial restricitons, Decreased activity tolerance, Decreased range of motion, Decreased strength, Difficulty walking, Impaired perceived functional ability  Visit Diagnosis: Chronic bilateral low back pain without sciatica - Plan: PT plan of care cert/re-cert  Muscle weakness (generalized) - Plan: PT plan of care cert/re-cert  Difficulty in walking, not  elsewhere classified - Plan: PT plan of care cert/re-cert  Pain in left hip - Plan: PT plan of care cert/re-cert  Pain in right hip - Plan: PT plan of care cert/re-cert  Pain in left ankle and joints of left foot - Plan: PT plan of care cert/re-cert  Cervicalgia - Plan: PT plan of care cert/re-cert  Pain in thoracic spine - Plan: PT plan of care cert/re-cert     Problem List Patient Active Problem List   Diagnosis Date Noted  . 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 Painful orthopaedic hardware ([place]) 12/15/2017  . Posterior tibial tendinitis, left leg 12/15/2017  . 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 Open fracture of tibia and fibula, shaft, left, type I or II, with nonunion, subsequent encounter 07/21/2017  . Closed pilon fracture, left, initial encounter 03/08/2017  . Closed fracture of left fibula and tibia   . Chest pain 10/27/2016  . Unstable angina ([place])   . CAD (coronary artery disease), native coronary artery   . Anxiety   . Irritable bowel syndrome   . Depression   . Hyperlipidemia   . History of migraines      Sigurd Sos, PT 12/22/17 3:33 PM PHYSICAL THERAPY DISCHARGE SUMMARY  Visits from Start of Care: 7  Current functional level related to goals / functional outcomes: See above for current status.  Pt didn't return after 12/22/17.     Remaining deficits: See above.   Education / Equipment: HEP Plan: Patient agrees to discharge.  Patient goals were not met. Patient is being discharged due to not returning since the last visit.  ?????        Sigurd Sos, PT 02/09/18 2:09 PM   [facility] [address], [place], [place], [address] Phone: [phone]   Fax:  [phone]  Name: [name] MRN: [id number] Date of Birth: 15-Sep-1945

## 2017-12-24 ENCOUNTER — Encounter: Payer: Medicare Other | Admitting: Physical Therapy

## 2017-12-28 ENCOUNTER — Encounter: Payer: Medicare Other | Admitting: Physical Therapy

## 2017-12-31 ENCOUNTER — Encounter: Payer: Medicare Other | Admitting: Physical Therapy

## 2018-01-04 ENCOUNTER — Encounter: Payer: Medicare Other | Admitting: Physical Therapy

## 2018-01-07 ENCOUNTER — Encounter: Payer: Medicare Other | Admitting: Physical Therapy

## 2018-01-28 ENCOUNTER — Ambulatory Visit (INDEPENDENT_AMBULATORY_CARE_PROVIDER_SITE_OTHER): Payer: Medicare Other | Admitting: Neurology

## 2018-01-28 DIAGNOSIS — G5132 Clonic hemifacial spasm, left: Secondary | ICD-10-CM

## 2018-01-28 DIAGNOSIS — G5139 Clonic hemifacial spasm, unspecified: Secondary | ICD-10-CM

## 2018-01-28 MED ORDER — ONABOTULINUMTOXINA 100 UNITS IJ SOLR
50.0000 [IU] | Freq: Once | INTRAMUSCULAR | Status: AC
  Administered 2018-01-28: 50 [IU] via INTRAMUSCULAR

## 2018-01-28 NOTE — Procedures (Signed)
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  Been 6 months and just noting minor twitch.  Stress increases sx's and son on transplant list now for liver/pancreas.  No asymmetry of face.  Mild L facial spasm noted  Consent obtained from: The patient Benefits discussed included, but were not limited to decreased muscle tightness, increased joint range of motion, and decreased pain.  Risk discussed included, but were not limited pain and discomfort, bleeding, bruising, excessive weakness, venous thrombosis, muscle atrophy and dysphagia.  A copy of the patient medication guide was given to the patient which explains the blackbox warning.  Patients identity and treatment sites confirmed yes.  Details of Procedure: Skin was cleaned with alcohol.   Prior to injection, the needle plunger was aspirated to make sure the needle was not within a blood vessel.  There was no blood retrieved on aspiration.    Following is a summary of the muscles injected  And the amount of Botulinum toxin used:  Injections  Location Left  Right Units Number of sites        Corrugator 2.5  2.5   Procerus 2.5 2.5 5.0   Lower Lid, Lateral       Lower Lid Medial  2.5    2.5    Upper Lid, Lateral 2.5   2.5   Upper Lid, Medial      Canthus 2.5 2.5 5.0   Nasalis      Masseter      Temporalis 2.5  2.5   Zygomaticus Major 2.5  2.5   TOTAL UNITS:   22.5    Agent: Botulinum Type A ( Onobotulinum Toxin type A ).  1 vials of Botox were used, each containing 50 units and freshly diluted with 2 mL of sterile, non-perserved saline   Total injected (Units): 22.5  Total wasted (Units): 0 Pt tolerated procedure well without complications.   Reinjection is anticipated in 6 months.

## 2018-02-03 DIAGNOSIS — S82202M Unspecified fracture of shaft of left tibia, subsequent encounter for open fracture type I or II with nonunion: Secondary | ICD-10-CM | POA: Diagnosis not present

## 2018-02-03 DIAGNOSIS — T8484XA Pain due to internal orthopedic prosthetic devices, implants and grafts, initial encounter: Secondary | ICD-10-CM

## 2018-02-10 ENCOUNTER — Ambulatory Visit (INDEPENDENT_AMBULATORY_CARE_PROVIDER_SITE_OTHER): Payer: Medicare Other | Admitting: Orthopaedic Surgery

## 2018-02-10 ENCOUNTER — Inpatient Hospital Stay (INDEPENDENT_AMBULATORY_CARE_PROVIDER_SITE_OTHER): Payer: Medicare Other | Admitting: Physician Assistant

## 2018-02-10 ENCOUNTER — Encounter (INDEPENDENT_AMBULATORY_CARE_PROVIDER_SITE_OTHER): Payer: Self-pay | Admitting: Orthopaedic Surgery

## 2018-02-10 DIAGNOSIS — S82202M Unspecified fracture of shaft of left tibia, subsequent encounter for open fracture type I or II with nonunion: Secondary | ICD-10-CM

## 2018-02-10 DIAGNOSIS — S82402M Unspecified fracture of shaft of left fibula, subsequent encounter for open fracture type I or II with nonunion: Secondary | ICD-10-CM

## 2018-02-10 NOTE — Progress Notes (Signed)
The patient is a week out from [place] removing the proximal interlocking screw that was symptomatic and causing pain in her left leg from tibial nailing.  She still has pain in that leg and walks with a significant limp.  She has significant deconditioning.  Her original injury was in August of last year.  She then had a revision surgery in January of this year.  She is been a long amount of time with nonweightbearing.  She is 72 years old.  She had significant amount of deconditioning from this injury.  On exam her incision from removing the screw along the lateral aspect of her proximal tibia on the left side is healed nicely.  She has good flexibility and ankle function.  I gave her reassurance that hopefully with time she will get stronger and can walk better.  I do feel that she would benefit from a handicap placard for her car.  I would like to see her back in 3 months and at that visit I would like an AP and lateral of the left tibia.

## 2018-03-03 ENCOUNTER — Other Ambulatory Visit (INDEPENDENT_AMBULATORY_CARE_PROVIDER_SITE_OTHER): Payer: Self-pay

## 2018-03-03 ENCOUNTER — Telehealth (INDEPENDENT_AMBULATORY_CARE_PROVIDER_SITE_OTHER): Payer: Self-pay | Admitting: Orthopaedic Surgery

## 2018-03-03 DIAGNOSIS — S82899A Other fracture of unspecified lower leg, initial encounter for closed fracture: Secondary | ICD-10-CM

## 2018-03-03 NOTE — Telephone Encounter (Signed)
07/27/17  Surgery  Open fracture and fibula,shaft, left    Patient was seen for open fracture of left tibia and fibula at last visit, Scheduled on the 16th of August. Patient called this morning in pain struggling with left leg pain from prior surgery.

## 2018-03-03 NOTE — Telephone Encounter (Signed)
Not sure what else we can do for her left leg.  Happy to see her next week to eval further.  We could order a CT scan of the left distal tibia to rule-out a non-union, but her x-rays have looked good.

## 2018-03-03 NOTE — Telephone Encounter (Signed)
Patient states she's still in so much pain, she states something has got to give. She states if she needs to get a 2nd opinion she will

## 2018-03-03 NOTE — Telephone Encounter (Signed)
Patient aware we have sent order for her to have CT

## 2018-03-04 ENCOUNTER — Telehealth (INDEPENDENT_AMBULATORY_CARE_PROVIDER_SITE_OTHER): Payer: Self-pay | Admitting: Orthopaedic Surgery

## 2018-03-04 NOTE — Telephone Encounter (Signed)
Patient called needing records and xrays as she has an appt at [facility]. 8/24. I told her we would could have everything ready today and she would need to sign release form when she comes to pick them up.

## 2018-03-10 ENCOUNTER — Inpatient Hospital Stay: Admission: RE | Admit: 2018-03-10 | Payer: Medicare Other | Source: Ambulatory Visit

## 2018-03-16 ENCOUNTER — Ambulatory Visit
Admission: RE | Admit: 2018-03-16 | Discharge: 2018-03-16 | Disposition: A | Discharge status: Home / Self Care | Payer: Medicare Other | Source: Ambulatory Visit | Attending: Orthopaedic Surgery | Admitting: Orthopaedic Surgery

## 2018-03-16 DIAGNOSIS — S82899A Other fracture of unspecified lower leg, initial encounter for closed fracture: Secondary | ICD-10-CM

## 2018-03-30 ENCOUNTER — Encounter (INDEPENDENT_AMBULATORY_CARE_PROVIDER_SITE_OTHER): Payer: Self-pay | Admitting: Orthopaedic Surgery

## 2018-03-30 ENCOUNTER — Ambulatory Visit (INDEPENDENT_AMBULATORY_CARE_PROVIDER_SITE_OTHER): Payer: Medicare Other | Admitting: Orthopaedic Surgery

## 2018-03-30 DIAGNOSIS — S82202M Unspecified fracture of shaft of left tibia, subsequent encounter for open fracture type I or II with nonunion: Secondary | ICD-10-CM

## 2018-03-30 DIAGNOSIS — S82402M Unspecified fracture of shaft of left fibula, subsequent encounter for open fracture type I or II with nonunion: Secondary | ICD-10-CM

## 2018-03-30 MED ORDER — GABAPENTIN 100 MG PO CAPS: 100.0000 mg | ORAL_CAPSULE | Freq: Three times a day (TID) | ORAL | 0 refills | Status: DC

## 2018-03-30 NOTE — Progress Notes (Signed)
The patient is well-known to me.  She is been recovering from multiple surgeries on her left tibia as a relates to fracture nonunion with subsequent bone grafting and rod placement.  She is having pain from her groin down to her knee and then from her knee down into her foot with numbness and tingling in the foot.  I do feel the numbness and tingling in her foot may be related from just the surgery itself and more we had to make a wide incision on the anterolateral aspect and certainly she reports almost as if it is irritation of the superficial peroneal nerve.  She has numbness in her second and third toes.  She has been thrown off in her gait from dealing with a significantly bad fracture and how this affects her mobility.  She is very petite and active 72 year old.  She has had an MRI of her lumbar spine 10 years ago due to chronic low back issues and did have a herniated disc the left side at that point.  On exam she has excellent strength throughout her left lower extremity.  I stressed the fracture site and it causes no pain at all in that area.  She does have subjective numbness of her foot.  There is subjective pain in the inner thigh.  I can easily put her hip and knee through range of motion with no problems at all there is no knee joint effusion.  The CT scan of her left tibia does suggest an area of not bridging callus to the anterior lateral aspect but this is a small area of mild looked at a looks of his bone graft in this area and clinically she is not having pain there is no evidence of hardware failure.  Certainly she may be thrown off from a gait standpoint having dealt with a fracture for over a year.  She has already been through therapy with gait training and work on her balance and coordination.  At this point I would like to obtain an MRI of her lumbar spine to rule out any type of nerve compression to the left side that is causing her other radicular symptoms.  She agrees with this plan  as well.  I will start her on 100 mg of Neurontin twice a day as well.  I counseled her about this.  We will see her back in 2 weeks ago with MRI.

## 2018-03-31 ENCOUNTER — Other Ambulatory Visit (INDEPENDENT_AMBULATORY_CARE_PROVIDER_SITE_OTHER): Payer: Self-pay

## 2018-03-31 DIAGNOSIS — M4807 Spinal stenosis, lumbosacral region: Secondary | ICD-10-CM

## 2018-04-13 ENCOUNTER — Ambulatory Visit (INDEPENDENT_AMBULATORY_CARE_PROVIDER_SITE_OTHER): Payer: Medicare Other | Admitting: Orthopaedic Surgery

## 2018-04-15 ENCOUNTER — Telehealth: Payer: Self-pay | Admitting: Neurology

## 2018-04-15 NOTE — Telephone Encounter (Signed)
Called patient she is going to have Dr.Barnes send over a referral. Thanks!

## 2018-04-15 NOTE — Telephone Encounter (Signed)
Can we ask for a new referral from Dr. Drema Dallas since this is an urgent request? Not seen for tremor since 2015. Thanks!

## 2018-04-15 NOTE — Telephone Encounter (Signed)
Patient is calling in stating that Dr.Barnes her PCP is stating that she needs to be seen ASAP for her Shaking. Is there a work in spot that we can put her in. Please call her back at [phone]. Or let us know where to put her. Thanks!

## 2018-04-26 NOTE — Progress Notes (Signed)
[name] was seen today in neurologic consultation at the request of Leighton Ruff, MD.  The consultation is for the evaluation of facial twitching.  The pt reports that it has been going on intermittently for about a year.  It is not continuous.  It seems to cluster and go for 3-4 days and then stop.  It is always on the L side of the face.  The longest it goes away is 3-4 days.  She notices no initiating factors.  She does admit to a tremor in the hands for the last 3-4 years.  There is an internal tremor as well.  Her mother had a similar shake.  Because of this, she has trouble writing (shaking, not micrographia) and has trouble putting on her makeup.  She does not drink caffeine or alcohol so does not know affect on tremor.  She has had no change in balance.  She has no voice changes.  She has intermittent congenital strabismus and rarely gets diplopia because of this.  Her bladder is under control but she has frequent urination and has bowel urgency (hx of IBS).    The patient does bring in a video today of her symptoms, since they are intermittent.  We reviewed this together.  04/07/13 update: The patient has a history of left hemifacial spasm.  She underwent her first Botox injections on 02/24/2013.  She was pleased with efficacy of the injections.  The botox seem to take he can almost immediately.  She has, however, noticed some left facial droop.  This has not been particularly bothersome, but it was definitely noticeable.  She has very rare twitching over the left chin.  11/23/13 update: The patient presents today for followup.  I have not seen her since September, 2014.  The patient reports that her eye began to twitch slowly on the left and it is now twitching almost constantly.  She has developed other symptoms over this past year as well.  She states that she feels tremulous on the inside.  She has developed a tremor in her hands.  She has no balance trouble.  She has developed double  vision, however.  She had a cataract removed from the right eye and that surgery what well, but she recently had a cataract removed from the left eye and she states that that did not go well.  It made the diplopia worse.  Her ophthalmologist, however, cannot find a reason for the double vision.  She admits that she had a double vision prior to cataract surgery, but cataract surgery seemed to make it worse.  She rarely has trouble describing whether it is horizontal or vertical and states that it is both.  She states that it is monocular.  She has no difficulty swallowing.  She has no shortness of breath.  She has no weakness.  She reports that she had a cosmetic laser peel done on her face since last visit.  06/21/15 update:  The patient is following up today.  I have not seen her in over a year.  She has a history of left hemifacial spasm.  Her last Botox was on 03/30/2014.  She wishes to restart this.  She does state that the last botox caused some eye droop and she didn't realize it was from the botox and she ended up going to the eye doc and she almost had an eye surgery.  She states that upper lid did not droop but the lower lid was droopy and  she couldn't close the eye properly.  Now the hemifacial spasm has come back with a vengence.  She is now noting a pull even in the neck.    04/29/18 update: Patient is seen today in follow-up for tremor.  She has been receiving Botox here for a long time for left hemifacial spasm, and that has helped.  However, we have not discussed tremor in years.  She does have mild essential tremor.  She has not wanted treatment in the past.  She has had a DaTscan in the past that was negative.  States that she has had a lot of stress as her son had pancreatitis and told he had liver disease.  Sounds like he had a pseudocyst.  He had to move in with them for 3 months and is still there.  She is not sure if that has influenced tremor, which seems worse.  Degree of tremor changes.   It is internal and external tremor.   States that her fluoxetine was increased.  "its not the tremor that is getting to me.  Its the anxiety associated with the tremor that gets to me."   Neuroimaging was performed on 02/08/2013.  This was a negative MRI of the brain.  I reviewed this with her.    PREVIOUS MEDICATIONS: Not applicable  ALLERGIES:   Allergies  Allergen Reactions  . Shellfish-Derived Products Anaphylaxis, Shortness Of Breath and Swelling    Throat swells; Iodine is ok  . Lipitor [Atorvastatin] Nausea Only and Other (See Comments)    Fatigue and Lethargy. Leg, back,Joint, and muscle pain. Per patient "My stool was a muddy clay color and I having diarrhea" pt states "I never want to take another statin again"  . Crestor [Rosuvastatin Calcium]     UNSPECIFIED REACTION   . Codeine Nausea And Vomiting    CURRENT MEDICATIONS:  Current Outpatient Medications on File Prior to Visit  Medication Sig Dispense Refill  . ALPRAZolam (XANAX) 0.5 MG tablet Take 0.5 mg by mouth 2 (two) times daily.     . Calcium Carbonate (CALCIUM 600 PO) Take 1 tablet by mouth daily.    . cholecalciferol (VITAMIN D) 1000 units tablet Take 1,000 Units by mouth daily.    . diclofenac (VOLTAREN) 75 MG EC tablet Take 75 mg by mouth 2 (two) times daily as needed.  0  . FLUoxetine (PROZAC) 40 MG capsule TAKE ONE CAPSULE BY MOUTH ONCE DAILY (Patient taking differently: Take 40 mg by mouth 2 (two) times daily. ) 30 capsule 0  . Multiple Vitamins-Minerals (MULTIVITAMIN WITH MINERALS) tablet Take 1 tablet by mouth daily.    . traZODone (DESYREL) 50 MG tablet TAKE 1 TABLET BY MOUTH AT BEDTIME (QUALITEST BRAND ONLY [phone]) (Patient taking differently: TAKE 50 MG BY MOUTH AT BEDTIME (QUALITEST BRAND ONLY [phone])) 30 tablet 5   No current facility-administered medications on file prior to visit.     PAST MEDICAL HISTORY:   Past Medical History:  Diagnosis Date  . Anxiety   . Arthritis    "back,  neck" (03/08/2017)  . Asthma in child   . Bowel obstruction ([place])   . CAD (coronary artery disease), native coronary artery   . Chronic lower back pain   . Chronic neck pain   . Closed left tibial fracture   . Colon polyps   . Complication of anesthesia    "I was awake during one of my surgeries" reports that she felt them start the incision and heard them talking reports  happened ~ 35 years ago    . Depression   . Diverticulosis   . Hyperlipidemia   . Irritable bowel syndrome   . Migraine    "used to be severe; now maybe 1/year or 2" (03/08/2017)  . Pneumonia    H!N!    PAST SURGICAL HISTORY:   Past Surgical History:  Procedure Laterality Date  . ANKLE HARDWARE REMOVAL Left 07/27/2017   Hardware removal left ankle, takedown non-union with allograft bone graft, Intramedullary Nail (Left)  . APPENDECTOMY    . AUGMENTATION MAMMAPLASTY Bilateral   . BRACHIOPLASTY Bilateral    Arm Lift   . CARDIAC CATHETERIZATION    . CHOLECYSTECTOMY OPEN    . COLON SURGERY    . EXTERNAL FIXATION LEG Left 03/08/2017   Procedure: INTERNAL FIXATION LEFT ANKLE;  Surgeon: Newt Minion, MD;  Location: [facility];  Service: Orthopedics;  Laterality: Left;  . EYE MUSCLE SURGERY Bilateral X 2   "lazy eye; OR twice on each eye; Dr. Annamaria Boots"  . FRACTURE SURGERY    . HARDWARE REMOVAL Left 07/27/2017   Procedure: HARDWARE REMOVAL;  Surgeon: Mcarthur Rossetti, MD;  Location: [facility];  Service: Orthopedics;  Laterality: Left;  . LEFT HEART CATH AND CORONARY ANGIOGRAPHY N/A 10/27/2016   Procedure: Left Heart Cath and Coronary Angiography;  Surgeon: Peter M Martinique, MD;  Location: [facility];  Service: Cardiovascular;  Laterality: N/A;  . LEFT HEART CATHETERIZATION WITH CORONARY ANGIOGRAM N/A 08/16/2014   Procedure: LEFT HEART CATHETERIZATION WITH CORONARY ANGIOGRAM;  Surgeon: Sinclair Grooms, MD;  Location: [facility];  Service: Cardiovascular;  Laterality: N/A;  . NASAL SINUS SURGERY    . ORIF ANKLE  FRACTURE Left 03/08/2017  . ORIF ANKLE FRACTURE Left 07/27/2017   Procedure: Hardware removal left ankle, takedown non-union with allograft bone graft, Intramedullary Nail;  Surgeon: Mcarthur Rossetti, MD;  Location: [facility];  Service: Orthopedics;  Laterality: Left;  . PARTIAL COLECTOMY  1970s  . TONSILLECTOMY      SOCIAL HISTORY:   Social History   Socioeconomic History  . Marital status: Married    Spouse name: Not on file  . Number of children: 2  . Years of education: Not on file  . Highest education level: Not on file  Occupational History  . Occupation: Retired Merchant navy officer)     Employer: RETIRED  Social Needs  . Financial resource strain: Not on file  . Food insecurity:    Worry: Not on file    Inability: Not on file  . Transportation needs:    Medical: Not on file    Non-medical: Not on file  Tobacco Use  . Smoking status: Former Smoker    Packs/day: 1.00    Years: 41.00    Pack years: 41.00    Types: Cigarettes    Last attempt to quit: 01/10/2010    Years since quitting: 8.3  . Smokeless tobacco: Never Used  Substance and Sexual Activity  . Alcohol use: No  . Drug use: No  . Sexual activity: Not Currently    Partners: Female  Lifestyle  . Physical activity:    Days per week: Not on file    Minutes per session: Not on file  . Stress: Not on file  Relationships  . Social connections:    Talks on phone: Not on file    Gets together: Not on file    Attends religious service: Not on file    Active member of club or  organization: Not on file    Attends meetings of clubs or organizations: Not on file    Relationship status: Not on file  . Intimate partner violence:    Fear of current or ex partner: Not on file    Emotionally abused: Not on file    Physically abused: Not on file    Forced sexual activity: Not on file  Other Topics Concern  . 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   Marriage in good health.      Physician roster   Gyn- Helene Shoe, NP   Opthal- Dr Annamaria Boots (pediatric opthal)   GI- dr Adriana Simas- Dr Gladstone Lighter   ENT- Dr Ernesto Rutherford             FAMILY HISTORY:   Family Status  Relation Name Status  . Mother  Deceased at age 61       CAD  . Father  Deceased at age 34       CAD  . Sister  Deceased  . MGF  Deceased  . MGM  Deceased  . PGM  Deceased  . PGF  Deceased  . Son  Alive  . Son  Alive  . Neg Hx  (Not Specified)    ROS:  Review of Systems  Constitutional: Negative.   HENT: Negative.   Eyes: Negative.   Respiratory: Negative.   Cardiovascular: Negative.   Musculoskeletal: Negative.   Skin: Negative.   Neurological: Positive for tremors.  Endo/Heme/Allergies: Negative.   Psychiatric/Behavioral: Positive for depression. The patient is nervous/anxious.      PHYSICAL EXAMINATION:    VITALS:   Vitals:   04/29/18 0924  BP: 114/64  Pulse: 78  SpO2: 95%  Weight: 132 lb (59.9 kg)  Height: 5' 1.5" (1.562 m)    GEN:  The patient appears stated age and is in NAD. HEENT:  Normocephalic, atraumatic.  The mucous membranes are moist. The superficial temporal arteries are without ropiness or tenderness. CV:  RRR Lungs:  CTAB Neck/HEME:  There are no carotid bruits bilaterally.  Neurological examination:  Orientation: The patient is alert and oriented x3. Cranial nerves: There is good facial symmetry. The speech is fluent and clear. Soft palate rises symmetrically and there is no tongue deviation. Hearing is intact to conversational tone. Sensation: Sensation is intact to light touch throughout Motor: Strength is 5/5 in the bilateral upper and lower extremities.   Shoulder shrug is equal and symmetric.  There is no pronator drift.  Movement examination: Tone: There is normal tone in the upper and lower  extremities. Abnormal movements: She has mild tremor of the outstretched hands.  It only slightly increases with intention.  No significant trouble with Archimedes spirals.  Tremor is evident when she pours water from one glass to another, but only spills the water because of carelessness.  She has had a tremor in the "yes" direction.  There is no significant hemifacial spasm noted today.  Rare pole of the zygomaticus on the left. Coordination:  There is slowing of heel taps and toe taps on the left.  All other rapid alternating movements are normal.  There is no decremation.   Gait and Station: The patient has no difficulty arising out of a deep-seated chair without the use of  the hands. The patient's stride length is normal.      IMPRESSION/PLAN  1. left hemifacial spasm.  -She really does well with Botox.  She is scheduled for re-injection next week. 2. mild essential tremor.  -DaT scan in June 2015 was normal  -I think that this is likely worsened because of stress/anxiety.  She admits that she is caring for her son with chronic pancreatitis and on days that he does not do well, she does not either.  I will start her on low-dose propranolol, 10 mg twice per day as the biggest issue is a feeling of internal tremor and anxiety.  She will monitor her blood pressure.  Her primary care just increased her fluoxetine as well. 3.  I will see her for tremor back in the next 5 months.

## 2018-04-29 ENCOUNTER — Ambulatory Visit: Payer: Medicare Other | Admitting: Neurology

## 2018-04-29 ENCOUNTER — Encounter: Payer: Self-pay | Admitting: Neurology

## 2018-04-29 VITALS — BP 114/64 | HR 78 | Ht 61.5 in | Wt 132.0 lb

## 2018-04-29 DIAGNOSIS — G250 Essential tremor: Secondary | ICD-10-CM

## 2018-04-29 DIAGNOSIS — F411 Generalized anxiety disorder: Secondary | ICD-10-CM

## 2018-04-29 MED ORDER — PROPRANOLOL HCL 10 MG PO TABS: 10.0000 mg | ORAL_TABLET | Freq: Two times a day (BID) | ORAL | 1 refills | Status: DC

## 2018-04-29 NOTE — Patient Instructions (Signed)
Start propranolol 10 mg twice per day.  Let me know if you have any problems.

## 2018-05-06 ENCOUNTER — Ambulatory Visit (INDEPENDENT_AMBULATORY_CARE_PROVIDER_SITE_OTHER): Payer: Medicare Other | Admitting: Neurology

## 2018-05-06 DIAGNOSIS — G5139 Clonic hemifacial spasm, unspecified: Secondary | ICD-10-CM | POA: Diagnosis not present

## 2018-05-06 MED ORDER — ONABOTULINUMTOXINA 100 UNITS IJ SOLR
32.0000 [IU] | Freq: Once | INTRAMUSCULAR | Status: AC
  Administered 2018-05-06: 32 [IU] via INTRAMUSCULAR

## 2018-05-06 NOTE — Procedures (Signed)
Bot[facility]um Clinic   History:  Diagnosis: Hemifacial spasm (351.8)  Initial side: left   Result History    Adverse Effects: none   Consent obtained from: The patient Benefits discussed included, but were not limited to decreased muscle tightness, increased joint range of motion, and decreased pain.  Risk discussed included, but were not limited pain and discomfort, bleeding, bruising, excessive weakness, venous thrombosis, muscle atrophy and dysphagia.  A copy of the patient medication guide was given to the patient which explains the blackbox warning.  Patients identity and treatment sites confirmed yes.  Details of Procedure: Skin was cleaned with alcohol.   Prior to injection, the needle plunger was aspirated to make sure the needle was not within a blood vessel.  There was no blood retrieved on aspiration.    Following is a summary of the muscles injected  And the amount of Botulinum toxin used:  Injections  Location Left  Right Units Number of sites        Corrugator 2.5  2.5   Procerus 2.5 2.5 5.0   Lower Lid, Lateral       Lower Lid Medial  2.5    2.5    Upper Lid, Lateral 2.5   2.5   Upper Lid, Medial      Canthus 2.5 2.5 5.0   Nasalis      [place]      Temporalis 2.5  2.5   Zygomaticus Major 2.5  2.5   TOTAL UNITS:   22.5    Agent: Botulinum Type A ( Onobotulinum Toxin type A ).  1 vials of Botox were used, each containing 50 units and freshly diluted with 2 mL of sterile, non-perserved saline   Total injected (Units): 22.5  Total wasted (Units): 10 Pt tolerated procedure well without complications.   Reinjection is anticipated in 6 months.

## 2018-08-12 ENCOUNTER — Ambulatory Visit: Payer: Medicare Other | Admitting: Neurology

## 2018-08-15 ENCOUNTER — Telehealth: Payer: Self-pay | Admitting: Pharmacist

## 2018-08-15 DIAGNOSIS — E7800 Pure hypercholesterolemia, unspecified: Secondary | ICD-10-CM

## 2018-08-15 MED ORDER — PRAVASTATIN SODIUM 20 MG PO TABS: 20.0000 mg | ORAL_TABLET | Freq: Every evening | ORAL | 3 refills | Status: DC

## 2018-08-15 NOTE — Telephone Encounter (Signed)
Patient calls stating she hasnt taken her cholesterol medication in 6 months because she broke her leg and her son was critically ill and now her cholesterol is worse. Requesting a prescription for the medication she was previously on. Explained to patient that she saw Jinny Blossom in clinic dec 2018, she put her on pravastatin 20mg  and saw good results. offered to increase to 40mg  but she declined. She then called in April stating that she could not tolerate pravastatin and metoprolol and refused to continue. Patient states she "doesn't want to follow the recommendations by her PCP who has been checking her lipids and trusts Dr. Acie Fredrickson." I explained to patient that she reported fatigue to pravastatin but if she really wanted to continue we could reorder and follow up with labs in 2-3 months. Pravastatin 20mg  daily want sent into pharmacy. Baseline LFT last month WNL. LDL was 188. Labs scheduled for April 13. Pt sent to scheduling to schedule appointment with Dr. Acie Fredrickson since she hasnt seen him in >1 year. If LDL not at goal or pt unable to tolerate statin she will need an appointment to be seen in the lipid clinic.

## 2018-09-09 ENCOUNTER — Ambulatory Visit: Payer: Medicare Other | Admitting: Neurology

## 2018-09-29 ENCOUNTER — Ambulatory Visit: Payer: Medicare Other | Admitting: Neurology

## 2018-10-04 ENCOUNTER — Ambulatory Visit: Payer: Medicare Other | Admitting: Neurology

## 2018-10-07 ENCOUNTER — Encounter: Payer: Self-pay | Admitting: Neurology

## 2018-10-07 NOTE — Progress Notes (Signed)
Botox approval valid through 07/14/2019. Patient to use buy and bill.

## 2018-10-20 ENCOUNTER — Telehealth: Payer: Self-pay | Admitting: Cardiovascular Disease

## 2018-10-20 DIAGNOSIS — I2510 Atherosclerotic heart disease of native coronary artery without angina pectoris: Secondary | ICD-10-CM

## 2018-10-20 DIAGNOSIS — E782 Mixed hyperlipidemia: Secondary | ICD-10-CM

## 2018-10-20 NOTE — Telephone Encounter (Signed)
New Message    Pt says she needs to come in for her labs before her virtual visit

## 2018-10-24 ENCOUNTER — Other Ambulatory Visit: Payer: Medicare Other

## 2018-10-25 NOTE — Telephone Encounter (Signed)
Spoke with patient who has a virtual visit with Dr. Acie Fredrickson on 5/21 and would like to have lab work prior to that appointment for high cholesterol so that she can discuss with him. I advised that I can schedule her for lab the week before, however that appointment may change if Covid 19 restrictions are still in place. I advised patient to call back prior to appointment if she has any concerns about coming in. She verbalized understanding and agreement and thanked me for the call.

## 2018-11-11 ENCOUNTER — Ambulatory Visit: Payer: Medicare Other | Admitting: Neurology

## 2018-11-24 ENCOUNTER — Other Ambulatory Visit: Payer: Medicare Other

## 2018-12-01 ENCOUNTER — Telehealth: Payer: Medicare Other | Admitting: Cardiovascular Disease

## 2018-12-20 ENCOUNTER — Telehealth: Payer: Self-pay | Admitting: Pharmacist

## 2018-12-20 MED ORDER — EZETIMIBE 10 MG PO TABS: 10.0000 mg | ORAL_TABLET | Freq: Every day | ORAL | 11 refills | Status: DC

## 2018-12-20 MED ORDER — EVOLOCUMAB 140 MG/ML ~~LOC~~ SOAJ: 1.0000 "pen " | SUBCUTANEOUS | 11 refills | Status: DC

## 2018-12-20 NOTE — Telephone Encounter (Signed)
PA for Repatha approved through 06/21/2019. Copay is $40/month. Patient states she is unsure if this is affordable. Gave her information about [employer] and encouraged her to call them. Patient now wishes to try Zetia. Aware that it might not get LDL to goal. Will send in Rx for Zetia 10mg  daily to pharmacy. Will recheck lipids in 8-12 weeks.

## 2018-12-20 NOTE — Telephone Encounter (Signed)
Patient ID: [name]                 DOB: 1946/01/01                    MRN: [id number]     HPI: [name] is a 72 y.o. female patient referred to lipid clinic by Dr Acie Fredrickson. PMH is significant for CAD, HLD, and angina. Left heart cath in April 2018 demonstrated prox LAD to mid LAD 30% stenosed. Pt has a history of statin intolerance. She called the clinic back in February stating she has been off cholesterol medication for 6 months but wanted to resume pravastatin 20mg  daily. Patient had repeat labs done  on 4/20.  Today's follow up visit was done via phone due to COVID-19 pandemic. Patient states that she is tolerating pravastatin 20mg  well. She states that she adheres to a low fat diet.   Current Medications: pravastatin 20mg  daily Intolerances: atorvastatin 20mg  daily, rosuvastatin 10mg  and 20mg  daily - myalgias Risk Factors: angina, CAD, prior tobacco abuse LDL goal: 70mg /dL  Diet: Eats lean meats and fish. Does not eat any fried foods.  Family History: The patient's family history includes Colon cancer in her maternal grandfather; Coronary artery disease in her father, mother, and sister; Diabetes in her sister; Heart disease in her father and mother; Hyperlipidemia in her mother; Hypertension in her mother; Peripheral vascular disease in her sister; Stroke (age of onset: 59) in her sister.  Social History: The patient  reports that she quit smoking about 7 years ago. Her smoking use included cigarettes. She has a 41.00 pack-year smoking history. she has never used smokeless tobacco. She reports that she does not drink alcohol or use drugs.   Labs: 10/31/2018: TC 190, TG 86, HDL 64, LDL 108 (pravastatin 20mg  daily) 06/01/2017: TC 215, TG 95, HDL 68, LDL 128 (no therapy) 02/24/2017: TC 264, TG 114, HDL 64, LDL 177 (no therapy)  Past Medical History:  Diagnosis Date  . Anxiety   . Arthritis    "back, neck" (03/08/2017)  . Asthma in child   . Bowel obstruction ([place])   .  CAD (coronary artery disease), native coronary artery   . Chronic lower back pain   . Chronic neck pain   . Closed left tibial fracture   . Colon polyps   . Complication of anesthesia    "I was awake during one of my surgeries" reports that she felt them start the incision and heard them talking reports happened ~ 35 years ago    . Depression   . Diverticulosis   . Hyperlipidemia   . Irritable bowel syndrome   . Migraine    "used to be severe; now maybe 1/year or 2" (03/08/2017)  . Pneumonia    H!N!    Current Outpatient Medications on File Prior to Visit  Medication Sig Dispense Refill  . ALPRAZolam (XANAX) 0.5 MG tablet Take 0.5 mg by mouth 2 (two) times daily.     . Calcium Carbonate (CALCIUM 600 PO) Take 1 tablet by mouth daily.    . cholecalciferol (VITAMIN D) 1000 units tablet Take 1,000 Units by mouth daily.    . diclofenac (VOLTAREN) 75 MG EC tablet Take 75 mg by mouth 2 (two) times daily as needed.  0  . FLUoxetine (PROZAC) 40 MG capsule TAKE ONE CAPSULE BY MOUTH ONCE DAILY (Patient taking differently: Take 40 mg by mouth 2 (two) times daily. ) 30 capsule 0  .  Multiple Vitamins-Minerals (MULTIVITAMIN WITH MINERALS) tablet Take 1 tablet by mouth daily.    . pravastatin (PRAVACHOL) 20 MG tablet Take 1 tablet (20 mg total) by mouth every evening. 90 tablet 3  . propranolol (INDERAL) 10 MG tablet Take 1 tablet (10 mg total) by mouth 2 (two) times daily. 180 tablet 1  . traZODone (DESYREL) 50 MG tablet TAKE 1 TABLET BY MOUTH AT BEDTIME (QUALITEST BRAND ONLY [phone]) (Patient taking differently: TAKE 50 MG BY MOUTH AT BEDTIME (QUALITEST BRAND ONLY [phone])) 30 tablet 5   No current facility-administered medications on file prior to visit.     Allergies  Allergen Reactions  . Shellfish-Derived Products Anaphylaxis, Shortness Of Breath and Swelling    Throat swells; Iodine is ok  . Lipitor [Atorvastatin] Nausea Only and Other (See Comments)    Fatigue and Lethargy.  Leg, back,Joint, and muscle pain. Per patient "My stool was a muddy clay color and I having diarrhea" pt states "I never want to take another statin again"  . Crestor [Rosuvastatin Calcium]     UNSPECIFIED REACTION   . Codeine Nausea And Vomiting    Assessment/Plan:  1. Hyperlipidemia - LDL currently above goal < 70 due to hx of chest pain and nonobstructive CAD. Pt is intolerant to Crestor and Lipitor. Doing well on pravastatin 20mg . Discussed treatment options with patient including increasing pravastatin to 40mg , adding zetia 10mg  daily or a PCSK9 inhibitor. Patient was not interested in increasing pravastatin as she has a history of intolerance (weakness, muscle aches). Discussed side effects and % LDL lowering of Zetia and PCSK9 inhibitors. Zetia will most likely not get patient to LDL goal. Patient agreeable to trying PCSK9 inhibitor. Will submit PA and call patient once I know a price.   Ramond Dial, Pharm.D, [facility]  [address], [place], [place] [address]  Phone: [phone]; Fax: [phone]  12/20/2018 10:58 AM

## 2018-12-22 ENCOUNTER — Ambulatory Visit: Payer: Self-pay

## 2018-12-22 ENCOUNTER — Ambulatory Visit (INDEPENDENT_AMBULATORY_CARE_PROVIDER_SITE_OTHER): Payer: Medicare Other | Admitting: Physician Assistant

## 2018-12-22 ENCOUNTER — Ambulatory Visit (INDEPENDENT_AMBULATORY_CARE_PROVIDER_SITE_OTHER): Payer: Medicare Other | Admitting: Neurology

## 2018-12-22 ENCOUNTER — Other Ambulatory Visit: Payer: Self-pay

## 2018-12-22 ENCOUNTER — Encounter: Payer: Self-pay | Admitting: Physician Assistant

## 2018-12-22 DIAGNOSIS — G5139 Clonic hemifacial spasm, unspecified: Secondary | ICD-10-CM

## 2018-12-22 DIAGNOSIS — M79662 Pain in left lower leg: Secondary | ICD-10-CM | POA: Diagnosis not present

## 2018-12-22 DIAGNOSIS — G245 Blepharospasm: Secondary | ICD-10-CM | POA: Diagnosis not present

## 2018-12-22 DIAGNOSIS — G5133 Clonic hemifacial spasm, bilateral: Secondary | ICD-10-CM

## 2018-12-22 MED ORDER — GABAPENTIN 100 MG PO CAPS: 300.0000 mg | ORAL_CAPSULE | Freq: Every day | ORAL | 1 refills | Status: DC

## 2018-12-22 MED ORDER — ONABOTULINUMTOXINA 100 UNITS IJ SOLR
25.0000 [IU] | Freq: Once | INTRAMUSCULAR | Status: AC
  Administered 2018-12-22: 25 [IU] via INTRAMUSCULAR

## 2018-12-22 NOTE — Procedures (Signed)
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  Risk discussed included, but were not limited pain and discomfort, bleeding, bruising, excessive weakness, venous thrombosis, muscle atrophy and dysphagia.  A copy of the patient medication guide was given to the patient which explains the blackbox warning.  Patients identity and treatment sites confirmed yes.  Details of Procedure: Skin was cleaned with alcohol.   Prior to injection, the needle plunger was aspirated to make sure the needle was not within a blood vessel.  There was no blood retrieved on aspiration.    Following is a summary of the muscles injected  And the amount of Botulinum toxin used:  Injections  Location Left  Right Units Number of sites        Corrugator 2.5 2.5 2.5   Procerus 2.5 2.5 5.0   Lower Lid, Lateral       Lower Lid Medial  2.5    2.5    Upper Lid, Lateral 2.5   2.5   Upper Lid, Medial      Canthus 2.5 2.5 5.0   Nasalis      [place]      Temporalis 2.5  2.5   Zygomaticus Major 2.5  2.5   TOTAL UNITS:   22.5    Agent: Botulinum Type A ( Onobotulinum Toxin type A ).  1 vials of Botox were used, each containing 50 units and freshly diluted with 2 mL of sterile, non-perserved saline   Total injected (Units): 25.0  Total wasted (Units): 25 Pt tolerated procedure well without complications.   Reinjection is anticipated in 6 months.

## 2018-12-22 NOTE — Progress Notes (Signed)
25 units wasted

## 2018-12-22 NOTE — Progress Notes (Signed)
Office Visit Note   Patient: [name]           Date of Birth: [birth year]           MRN: [id number] Visit Date: 12/22/2018              Requested by: Tanya Ruff, MD [place],  [name] [id number] PCP: Tanya Ruff, MD   Assessment & Plan: Visit Diagnoses:  1. Pain of left lower leg     Plan: We will place her on Neurontin 300 mg nightly.  Have her start taking vitamin B6 100 mg twice daily.  Have her follow-up with Dr. Ninfa Linden in a month to see what type of response she had to the Neurontin and B6.  However if her condition continues to progress with paresthesia of the leg would recommend EMG nerve conduction studies.  Questions encouraged and answered at length.  Follow-Up Instructions: Return in about 4 weeks (around 01/19/2019) for Dr. Ninfa Linden.   Orders:  Orders Placed This Encounter  Procedures   XR Ankle Complete Left   Meds ordered this encounter  Medications   gabapentin (NEURONTIN) 100 MG capsule    Sig: Take 3 capsules (300 mg total) by mouth at bedtime.    Dispense:  90 capsule    Refill:  1      Procedures: No procedures performed   Clinical Data: No additional findings.   Subjective: No chief complaint on file.   HPI Mrs. 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  She was last seen in September 2019 at that time she was having numbness in her foot and tingling between her toes.  She states that the numbness and tingling is progressing up the leg.  She did undergo an MRI in October 2019.  This MRI was reviewed by Dr. Nyoka Lint over a [facility].  He stated that he found no findings on the x-ray to explain her numbness in the left leg.  I personally reviewed the MRI today while patient was in the office and found no findings on the MRI to explain her numbness and tingling in the  left leg.  She unfortunately has been caring for her son who has end-stage liver disease and has had little time for herself or her doctors appointments.  She had no new injury to the legs he states it feels as if it is a wooden leg him in the numbness tingling pain of the leg is getting worse.  She states she has numbness tingling dorsal aspect of the foot and some in the plantar aspect of the foot involving the metatarsal heads and is also having pain that is now radiating up into the lower tibia. Review of Systems Please see HPI other negative.  Objective: Vital Signs: There were no vitals taken for this visit.  Physical Exam General well-developed well-nourished female no acute distress mood affect appropriate.  Ambulates without any assistive device with a slight antalgic gait on the left. Ortho Exam Left lower leg calf soft and nontender.  Able dorsiflex plantarflex foot.  Able to wiggle her toes.  Subjective decreased sensation over the superficial peroneal nerve and deep peroneal nerve region of the foot and anterior lower tibia globally.  She has full inversion eversion with 5 out of 5 strengths against resistance.  Surgical incisions are well-healed. Specialty Comments:  No specialty comments  available.  Imaging: Xr Ankle Complete Left  Result Date: 12/22/2018 Left ankle 3 views: Fracture is healed.  There is no hardware failure.  Talus well located within the ankle mortise no diastases.  No acute fractures.    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, type I or II, with nonunion, subsequent encounter 12/15/2017   Painful orthopaedic hardware ([place]) 12/15/2017   Posterior tibial tendinitis, left leg 12/15/2017   Closed disp comminuted fracture of shaft of left tibia with nonunion 07/27/2017   Open fracture of tibia and fibula, shaft, left, type I or II, with nonunion, subsequent encounter 07/21/2017   Closed  pilon fracture, left, initial encounter 03/08/2017   Closed fracture of left fibula and tibia    Chest pain 10/27/2016   Unstable angina (HCC)    CAD (coronary artery disease), native coronary artery    Anxiety    Irritable bowel syndrome    Depression    Hyperlipidemia    History of migraines    Past Medical History:  Diagnosis Date   Anxiety    Arthritis    "back, neck" (03/08/2017)   Asthma in child    Bowel obstruction ([place])    CAD (coronary artery disease), native coronary artery    Chronic lower back pain    Chronic neck pain    Closed left tibial fracture    Colon polyps    Complication of anesthesia    "I was awake during one of my surgeries" reports that she felt them start the incision and heard them talking reports happened ~ 35 years ago     Depression    Diverticulosis    Hyperlipidemia    Irritable bowel syndrome    Migraine    "used to be severe; now maybe 1/year or 2" (03/08/2017)   Pneumonia    H!N!    Family History  Problem Relation Age of Onset   Heart disease Mother        CHF   Coronary artery disease Mother    Hypertension Mother    Hyperlipidemia Mother    Heart disease Father        CHF   Coronary artery disease Father    Diabetes Sister        with all the complications   Coronary artery disease Sister    Stroke Sister 97       CVA   Peripheral vascular disease Sister    Colon cancer Maternal Grandfather    Cancer Neg Hx        breast or colon    Past Surgical History:  Procedure Laterality Date   ANKLE HARDWARE REMOVAL Left 07/27/2017   Hardware removal left ankle, takedown non-union with allograft bone graft, Intramedullary Nail (Left)   APPENDECTOMY     AUGMENTATION MAMMAPLASTY Bilateral    BRACHIOPLASTY Bilateral    Arm Lift    CARDIAC CATHETERIZATION     CHOLECYSTECTOMY OPEN     COLON SURGERY     EXTERNAL FIXATION LEG Left 03/08/2017   Procedure: INTERNAL FIXATION LEFT ANKLE;   Surgeon: Newt Minion, MD;  Location: [facility];  Service: Orthopedics;  Laterality: Left;   EYE MUSCLE SURGERY Bilateral X 2   "lazy eye; OR twice on each eye; Dr. Annamaria Boots"   [place] Left 07/27/2017   Procedure: HARDWARE REMOVAL;  Surgeon: Mcarthur Rossetti, MD;  Location: [facility];  Service: Orthopedics;  Laterality: Left;   LEFT HEART CATH AND CORONARY ANGIOGRAPHY N/A 10/27/2016   Procedure: Left Heart Cath and Coronary Angiography;  Surgeon: Peter M Martinique, MD;  Location: [facility];  Service: Cardiovascular;  Laterality: N/A;   LEFT HEART CATHETERIZATION WITH CORONARY ANGIOGRAM N/A 08/16/2014   Procedure: LEFT HEART CATHETERIZATION WITH CORONARY ANGIOGRAM;  Surgeon: Sinclair Grooms, MD;  [facility] C[facility];  Service: Cardiovascular;  Laterality: N/A;   NASAL SINUS SURGERY     ORIF ANKLE FRACTURE Left 03/08/2017   ORIF ANKLE FRACTURE Left 07/27/2017   Procedure: Hardware removal left ankle, takedown non-union with allograft bone graft, Intramedullary Nail;  Surgeon: Mcarthur Rossetti, MD;  Location: [facility];  Service: Orthopedics;  Laterality: Left;   PARTIAL COLECTOMY  1970s   TONSILLECTOMY     Social History   Occupational History   Occupation: Retired Merchant navy officer)     Employer: RETIRED  Tobacco Use   Smoking status: Former Smoker    Packs/day: 1.00    Years: 41.00    Pack years: 41.00    Types: Cigarettes    Quit date: 01/10/2010    Years since quitting: 8.9   Smokeless tobacco: Never Used  Substance and Sexual Activity   Alcohol use: No   Drug use: No   Sexual activity: Not Currently    Partners: Female

## 2019-01-31 ENCOUNTER — Other Ambulatory Visit: Payer: Self-pay | Admitting: Neurology

## 2019-01-31 NOTE — Telephone Encounter (Signed)
Requested Prescriptions   Pending Prescriptions Disp Refills  . propranolol (INDERAL) 10 MG tablet [Pharmacy Med Name: Propranolol HCl 10 MG Oral Tablet] 180 tablet 0    Sig: Take 1 tablet by mouth twice daily   Rx last filled:04/29/18 #180 1 refills  Pt last seen:04/29/18  Follow up appt scheduled:04/07/19

## 2019-02-16 ENCOUNTER — Encounter: Payer: Self-pay | Admitting: Cardiovascular Disease

## 2019-02-16 ENCOUNTER — Telehealth: Payer: Self-pay | Admitting: Pharmacist

## 2019-02-16 NOTE — Telephone Encounter (Signed)
Called patient to set up a time for patient to have lipid labs repeated Left VM to return call

## 2019-02-16 NOTE — Telephone Encounter (Signed)
Patient returned call. She had her labs drawn at PCP today. Will get labs from PCP office once they result

## 2019-02-20 MED ORDER — REPATHA SURECLICK 140 MG/ML ~~LOC~~ SOAJ: 1.0000 "pen " | SUBCUTANEOUS | 11 refills | Status: DC

## 2019-02-20 NOTE — Telephone Encounter (Addendum)
Labs checked 02/16/19: TC 163, TG 56, HDL 65, LDL 87, LFTs normal.  LDL goal < 70 due to hx of CAD  Spoke with pt - she is tolerating pravastatin 20mg  daily and ezetimibe 10mg  daily, however does report an increase in fatigue. States she needs to take multiple naps and will fall asleep when she's sitting up. Reports that fatigue has worsened with both pravastatin and ezetimibe. She is previously intolerant to atorvastatin 20mg  daily, rosuvastatin 10mg  and 20mg  daily.  She does not wish to increase her pravastatin dose due to prior statin intolerances. Discussed changing to [name] which pt is agreeable to, reports $40 copay is affordable. Will stop ezetimibe and pravastatin and advised pt to monitor for improvement in fatigue. She will start Repatha shots 1 week later and will call with concerns.

## 2019-02-20 NOTE — Addendum Note (Signed)
Addended by: Cattleya Dobratz E on: 02/20/2019 01:33 PM   Modules accepted: Orders

## 2019-03-07 NOTE — Progress Notes (Signed)
    Virtual Visit via Telephone Note The purpose of this virtual visit is to provide medical care while limiting exposure to the novel coronavirus.    Consent was obtained for phone visit:  Yes.   Answered questions that patient had about telehealth interaction:  Yes.   I discussed the limitations, risks, security and privacy concerns of performing an evaluation and management service by telephone. I also discussed with the patient that there may be a patient responsible charge related to this service. The patient expressed understanding and agreed to proceed.  Pt location: Home Physician Location: home Name of referring provider:  Leighton Ruff, MD I connected with .[name] at patients initiation/request on 03/08/2019 at 10:45 AM EDT by telephone and verified that I am speaking with the correct person using two identifiers.  Pt MRN:  [id number] Pt DOB:  24-May-1946   History of Present Illness:  Patient seen today in follow-up for her history of very mild essential tremor as well as left hemifacial spasm.  She is doing well with her Botox for hemifacial spasm.  She has had no side effects.  She has very mild essential tremor, that is made worse by her anxiety.  We started her on low-dose propranolol, 10 mg twice per day, last October.  She reports that sometimes she takes it one time per day and sometimes she will take it twice per day, depending how shaky she is.  Most days she just takes 1 tablet.   The medication works.  "I still have it but its not as bad."   Observations/Objective:   Vitals:   03/08/19 1020  Weight: 130 lb (59 kg)  Height: 5\' 2"  (1.575 m)     Assessment and Plan:   1.  Hemifacial spasm, left  -Doing well with Botox that continue as planned.  2.  Mild essential tremor, made worse by anxiety  -Has had normal DaTscan.  -Continue propranolol, 10 mg 1-2 tablets per day.  She rarely uses the twice per day dosing.  She did not realize that we sent a  prescription in July to the pharmacy.  I will check with the pharmacy to make sure it is there, but told her that she could pick it up for refill.  -Asks me about her internal tremor and what could cause that.  Discussed that this could be caused by essential tremor, but it also could be caused by her anxiety.  She did not disagree with this.  -Primary care also treating with fluoxetine.  Follow Up Instructions:  Will have her make a follow-up appointment in 1 year, but will continue with her Botox as previously scheduled.   -I discussed the assessment and treatment plan with the patient. The patient was provided an opportunity to ask questions and all were answered. The patient agreed with the plan and demonstrated an understanding of the instructions.   The patient was advised to call back or seek an in-person evaluation if the symptoms worsen or if the condition fails to improve as anticipated.    Total Time spent in visit with the patient was:  10 min, of which 100% of the time was spent in counseling.   Pt understands and agrees with the plan of care outlined.     Alonza Bogus, DO

## 2019-03-08 ENCOUNTER — Other Ambulatory Visit: Payer: Self-pay

## 2019-03-08 ENCOUNTER — Encounter: Payer: Self-pay | Admitting: Neurology

## 2019-03-08 ENCOUNTER — Telehealth: Payer: Self-pay

## 2019-03-08 ENCOUNTER — Telehealth (INDEPENDENT_AMBULATORY_CARE_PROVIDER_SITE_OTHER): Payer: Medicare Other | Admitting: Neurology

## 2019-03-08 VITALS — Ht 62.0 in | Wt 130.0 lb

## 2019-03-08 DIAGNOSIS — F411 Generalized anxiety disorder: Secondary | ICD-10-CM | POA: Diagnosis not present

## 2019-03-08 DIAGNOSIS — G250 Essential tremor: Secondary | ICD-10-CM | POA: Diagnosis not present

## 2019-03-08 NOTE — Telephone Encounter (Signed)
-----   Message from Gholson, DO sent at 03/08/2019 11:42 AM EDT ----- She should have plenty then.  She is only taking one per day most days.  Document that as phone note please. ----- Message ----- From: Ranae Plumber, CMA Sent: 03/08/2019  11:10 AM EDT To: Ludwig Clarks, DO  Called pharmacy patient pick up Rx that was sent on 01/31/19 for 90 day supply. No additional refills was added.  ----- Message ----- From: Ludwig Clarks, DO Sent: 03/08/2019  10:51 AM EDT To: Ranae Plumber, CMA  Will you call [facility] and make sure that she has a propranolol RF there?  Looks like it was sent by [place] in July.  She says she didn't check b/c I told her at botox that I would not refill it until she saw me (I probably did tell her that b/c it had been a while since she saw me) but looks like it got refilled anyway.  If they have it, just let pt know that its already there.  Also, just ask me if over 6 months since seen (unless I write it in their note).  I did tell her today that she could wait a year for f/u.  She does see me for botox in between.  Thanks!

## 2019-03-23 ENCOUNTER — Other Ambulatory Visit: Payer: Self-pay | Admitting: Nephrology

## 2019-03-23 DIAGNOSIS — R7989 Other specified abnormal findings of blood chemistry: Secondary | ICD-10-CM

## 2019-03-26 ENCOUNTER — Other Ambulatory Visit: Payer: Self-pay | Admitting: Neurology

## 2019-03-27 NOTE — Telephone Encounter (Signed)
Requested Prescriptions   Pending Prescriptions Disp Refills  . propranolol (INDERAL) 10 MG tablet [Pharmacy Med Name: Propranolol HCl 10 MG Oral Tablet] 180 tablet 0    Sig: Take 1 tablet by mouth twice daily   Rx last filled:01/31/19 #180 0 REFILLS  Pt last seen: 03/08/19   Follow up appt scheduled:04/07/19

## 2019-03-29 ENCOUNTER — Encounter: Payer: Self-pay | Admitting: Physician Assistant

## 2019-03-29 ENCOUNTER — Ambulatory Visit (INDEPENDENT_AMBULATORY_CARE_PROVIDER_SITE_OTHER): Payer: Medicare Other | Admitting: Physician Assistant

## 2019-03-29 ENCOUNTER — Ambulatory Visit
Admission: RE | Admit: 2019-03-29 | Discharge: 2019-03-29 | Disposition: A | Discharge status: Home / Self Care | Payer: Medicare Other | Source: Ambulatory Visit | Attending: Nephrology | Admitting: Nephrology

## 2019-03-29 DIAGNOSIS — R102 Pelvic and perineal pain: Secondary | ICD-10-CM

## 2019-03-29 DIAGNOSIS — M79662 Pain in left lower leg: Secondary | ICD-10-CM

## 2019-03-29 DIAGNOSIS — R7989 Other specified abnormal findings of blood chemistry: Secondary | ICD-10-CM

## 2019-03-29 NOTE — Progress Notes (Signed)
HPI Mrs. lying returns today follow-up of her numbness tingling down her left leg.  She states that she is overall getting worse.  She is now having some low back pain and pain particularly in her buttocks region in the left hip.  She reports her pain began to get worse after she stopped her NSAIDs at the advice of her renal physician.  She states Sunday she cannot get up and walk due to the pain.  Pain does radiate down the knee.  In regards to the left-sided numbness tingling into the lower leg this is not as bothersome at this point.  She has had no new injuries.  ROS: See HPI  Physical exam General well-developed well-nourished female no acute distress ambulates without any assistive device. Psych mood and affect appropriate. Bilateral hips full range of motion without pain.  She has tenderness over the left trochanteric region.  Left SI joint tenderness Faber's on the left causes pain in the left SI joint negative on the right with Faber's maneuver.  Subjective decreased sensation over the superficial peroneal nerve and deep peroneal nerve region of the left foot and lower anterior tibial.  Impression: Left SI joint pain Left lower leg pain and subjective decreased sensation.  Plan: We will obtain an MRI of her pelvis to rule out SI joint arthritis source of pain given her history of MRI in the past and it was not felt to be the cause of her low back, hip pain and neuropathy left leg.

## 2019-04-03 ENCOUNTER — Telehealth: Payer: Self-pay

## 2019-04-03 NOTE — Telephone Encounter (Signed)
Called to speak with a pt regarding getting repatha labs after the 1st dose of repatha but the pt stated that they have delayed taking it and have been experiencing severe back pain and thinks could possibly be the statins. Pt also stated that they have been having low bp and that they had a hard time getting bp at the doctor. Pt is willing to make an appt w/pharmd but prefers before the 04/12/19 but nothing is coming available.

## 2019-04-04 LAB — HM MAMMOGRAPHY

## 2019-04-07 ENCOUNTER — Encounter: Payer: Self-pay | Admitting: Pharmacist

## 2019-04-07 ENCOUNTER — Ambulatory Visit (INDEPENDENT_AMBULATORY_CARE_PROVIDER_SITE_OTHER): Payer: Medicare Other | Admitting: Pharmacist

## 2019-04-07 ENCOUNTER — Other Ambulatory Visit: Payer: Self-pay

## 2019-04-07 ENCOUNTER — Ambulatory Visit: Payer: Medicare Other | Admitting: Neurology

## 2019-04-07 DIAGNOSIS — F419 Anxiety disorder, unspecified: Secondary | ICD-10-CM | POA: Diagnosis not present

## 2019-04-07 DIAGNOSIS — E7800 Pure hypercholesterolemia, unspecified: Secondary | ICD-10-CM

## 2019-04-07 NOTE — Patient Instructions (Signed)
It was nice to meet you in person today.  Please stop your pravastatin and Zetia for 1 month.  After about a month, call us to let us know how you are feeling. If you feel better off the statin then we will discuss other options If you do not feel better, than we can discuss resuming statin Call us at [phone] with any concerns

## 2019-04-07 NOTE — Progress Notes (Signed)
Patient ID: [name]                 DOB: 05/17/1946                    MRN: [id number]     HPI: [name] is a 73 y.o. female patient referred to lipid clinic by Dr Acie Fredrickson. PMH is significant for CAD, HLD, and angina. Left heart cath in April 2018 demonstrated prox LAD to mid LAD 30% stenosed. Pt has a history of statin intolerance and presents to clinic for further management.  Pt has a history of statin intolerance. She called the clinic back in February stating she has been off cholesterol medication for 6 months but wanted to resume pravastatin 20mg  daily. Patient had repeat labs done on 4/20 where LDL was 108. She was seen via virtual visit on 12/20/2018 where she agreed to stop pravastatin and start Repatha, however patient thought that $40 a month was too expensive and declined applying for patient assistance. Therefore, she was started on zetia 10mg  along with pravastatin. In August patient LDL was 87. She reported tolerating pravastatin and zetia but reported an increase in fatigue. At this time, she again agreed to stop pravastatin and zetia and start Repatha. However patient called clinic on 9/21 stating she had not started Repatha and requested a visit with the PharmD. She also complains of increased back pain and low blood pressure.  Patient presents today in pretty good spirits. She has seen many doctors for her back pain. So far they say nothing is wrong with it. Also states that she has elbow pain. She is getting an MRI of her hips soon. She states that it takes her a long time to get out of bed. Has to do stretches to just get out of bed and then stretches on the floor in order to get up to go to the bathroom. States it improves throughout the day.   She states her anxiety has been better, but she is a high anxiety person. Stems a lot from her sons illness. She worries about things she should not worry about.   Asking about her blood pressure. States when she went to Dr. Shelly Bombard  or CMA had a very hard time hearing her BP and states it was 90/60. She usually has low BP and does not have any s/sx of low BP. Advised that it was ok.  Current Medications: pravastatin 20mg  daily, zetia 10mg  three times a week Intolerances: atorvastatin 20mg  daily, rosuvastatin 10mg  and 20mg  daily - myalgias Risk Factors: angina, CAD, prior tobacco abuse LDL goal: 70mg /dL  Diet: Eats lean meats and fish. Does not eat any fried foods.  Exercise: Pt broke her leg a few months ago so exercise has been limited. Pt stayed active prior to this.  Family History: The patient's family history includes Colon cancer in her maternal grandfather; Coronary artery disease in her father, mother, and sister; Diabetes in her sister; Heart disease in her father and mother; Hyperlipidemia in her mother; Hypertension in her mother; Peripheral vascular disease in her sister; Stroke (age of onset: 55) in her sister.  Social History: The patient  reports that she quit smoking about 7 years ago. Her smoking use included cigarettes. She has a 41.00 pack-year smoking history. she has never used smokeless tobacco. She reports that she does not drink alcohol or use drugs.   Labs: 02/16/2019:     TC 163, TG 56, HDL 65, LDL  87 (pravastatin 20mg  daily and zetia 10mg  daily) 10/31/2018:   TC 190, TG 86, HDL 64, LDL 108 (pravastatin 20mg  daily) 06/01/2017: TC 215, TG 95, HDL 68, LDL 128 (no therapy) 02/24/2017: TC 264, TG 114, HDL 64, LDL 177 (no therapy)  Past Medical History:  Diagnosis Date  . Anxiety   . Arthritis    "back, neck" (03/08/2017)  . Asthma in child   . Bowel obstruction ([place])   . CAD (coronary artery disease), native coronary artery   . Chronic lower back pain   . Chronic neck pain   . Closed left tibial fracture   . Colon polyps   . Complication of anesthesia    "I was awake during one of my surgeries" reports that she felt them start the incision and heard them talking reports happened ~ 35 years  ago    . Depression   . Diverticulosis   . Hyperlipidemia   . Irritable bowel syndrome   . Migraine    "used to be severe; now maybe 1/year or 2" (03/08/2017)  . Pneumonia    H!N!    Current Outpatient Medications on File Prior to Visit  Medication Sig Dispense Refill  . alendronate (FOSAMAX) 70 MG tablet Take 70 mg by mouth once a week.    . ALPRAZolam (XANAX) 0.5 MG tablet Take 0.5 mg by mouth 2 (two) times daily.     . cholecalciferol (VITAMIN D) 1000 units tablet Take 1,000 Units by mouth daily.    . Evolocumab (REPATHA SURECLICK) [id number] MG/ML SOAJ Inject 1 pen into the skin every 14 (fourteen) days. 2 pen 11  . ezetimibe (ZETIA) 10 MG tablet Take 10 mg by mouth daily.    Marland Kitchen FLUoxetine (PROZAC) 40 MG capsule TAKE ONE CAPSULE BY MOUTH ONCE DAILY (Patient taking differently: Take 40 mg by mouth 2 (two) times daily. ) 30 capsule 0  . gabapentin (NEURONTIN) 100 MG capsule Take 3 capsules (300 mg total) by mouth at bedtime. 90 capsule 1  . Multiple Vitamins-Calcium (ONE-A-DAY WOMENS FORMULA) TABS Take by mouth.    . pravastatin (PRAVACHOL) 20 MG tablet Take 20 mg by mouth daily.    . propranolol (INDERAL) 10 MG tablet Take 1 tablet by mouth twice daily 180 tablet 0  . traZODone (DESYREL) 50 MG tablet TAKE 1 TABLET BY MOUTH AT BEDTIME (QUALITEST BRAND ONLY [phone]) (Patient taking differently: TAKE 50 MG BY MOUTH AT BEDTIME (QUALITEST BRAND ONLY [phone])) 30 tablet 5   No current facility-administered medications on file prior to visit.     Allergies  Allergen Reactions  . Shellfish-Derived Products Anaphylaxis, Shortness Of Breath and Swelling    Throat swells; Iodine is ok  . Lipitor [Atorvastatin] Nausea Only and Other (See Comments)    Fatigue and Lethargy. Leg, back,Joint, and muscle pain. Per patient "My stool was a muddy clay color and I having diarrhea" pt states "I never want to take another statin again"  . Crestor [Rosuvastatin Calcium]     UNSPECIFIED REACTION   .  Pravastatin     fatigue  . Zetia [Ezetimibe]     fatigue  . Codeine Nausea And Vomiting    Assessment/Plan:  1. Hyperlipidemia -Patient experiences symptoms that may or may not be from statins. Will have patient stop all cholesterol medications (pravastatin and zetia) for 1 month. She will call us after this drug holiday and let us know how she is feeling. If pain improved, we will readdress PCKS9 inhibitors (she is hesitant due to  potentially back pain) vs Nexeltol which may be a challenge to get approved. If pain does not improve, can discuss resuming statin therapy. I did recommend patient see a phycologist to help with her anxiety/stress. She states that her husband is a Print production planner and is very supportive and a good listener.   Ramond Dial, Pharm.D, [facility]  [id number] [place], [place] [address]  Phone: [phone]; Fax: [phone]  04/07/2019 8:38 AM

## 2019-04-14 ENCOUNTER — Encounter: Payer: Self-pay | Admitting: Cardiovascular Disease

## 2019-04-14 ENCOUNTER — Other Ambulatory Visit: Payer: Self-pay

## 2019-04-14 ENCOUNTER — Ambulatory Visit: Payer: Medicare Other | Admitting: Cardiovascular Disease

## 2019-04-14 ENCOUNTER — Ambulatory Visit: Payer: Medicare Other

## 2019-04-14 VITALS — BP 98/70 | HR 70 | Ht 62.0 in | Wt 133.1 lb

## 2019-04-14 DIAGNOSIS — E782 Mixed hyperlipidemia: Secondary | ICD-10-CM | POA: Diagnosis not present

## 2019-04-14 DIAGNOSIS — E7800 Pure hypercholesterolemia, unspecified: Secondary | ICD-10-CM | POA: Diagnosis not present

## 2019-04-14 DIAGNOSIS — I2510 Atherosclerotic heart disease of native coronary artery without angina pectoris: Secondary | ICD-10-CM

## 2019-04-14 NOTE — Patient Instructions (Signed)

## 2019-04-14 NOTE — Progress Notes (Signed)
Cardiology Office Note   Date:  04/14/2019   ID:  [name], [name] 12-Dec-1945, MRN [id number]  PCP:  Leighton Ruff, MD  Cardiologist:   Mertie Moores, MD   No chief complaint on file.  Problem List 1. Chest pain     Oct. 24, 2017:   [name] is a 73 y.o. female who presents for exertional dyspnea / heaviness  started several years ago. Has seen Dr. Wynonia Lawman in the past.  Abnormal stress test  Cath Feb. 4, 2016 showed mid LAD stenosis of 40-50%.    she has exertional chest tightness.  Mid sternal pain, radiates to back of both arms, radiates up into her neck and then to upper chest   Has to stop and rest for the pain to go away.   Walking uphill is worse.   Occurs more often if she has eaten and then exercises.  Has been active and healthy,  Walks regularly . Walked 2 miles a day and has had to slow down. Has slowly gained weight but no sudden weight gain   Nov. 29, 2017:  [name] is doing ok Still having some CP especially if she eats after exercising .  Has CP if she exercises 30 minutes - 1 hour after eat The CP is a tightness in her throat, radiates to her neck, out  through to her shoulders and down both arms  The symptoms are better after starting carvedilol.   The symptoms do still occur though.  October 26, 2016:  [name] presents as a work in visit . Has been having some chest pressure ,  Had just awakened. Turned over in bed and the pain resolved.  Did not have to take a SL NTG. Had some dyspnea.   The following day , she and her husband were walking,  She develeoped CP , asssociated with increased dyspnea.  Had to stop and rest .  Walked slower and made it home ok Last several minutes.   Yesterday she had recurrent CP .  Took a SL NTG prior to walking ( had just eaten and she frequently has CP if she walks after eating . )  Took 2 SL NTG yesterday .  No CP this am .   Her last last cath was in Feb. 2016.  Had mild - moderate CAD by  cath in Feb. 2016.    Dec 01, 2016:  [name] is seen today for follow up of her moderate CAD.  Had a cath in October 27, 2016 showed mild coronary irregularities - LAD 30%. LV EF is hyperdynamic .   We started Toprol XL 12.5 mg last week.   Did not tolerate the Atorvastatin ( abdominal pains, leg, back muscle cramps) .    Seems to be doing well.   No CP.  Had  Has not been walking .   June 01, 2017:   Seen with husband Tanya, Mcpherson  seen today for follow-up of her moderate coronary artery disease. Tolerating   Feeling well Has not been exercising .   Broke her left leg this past summer - Tibia and fibula each in 4 spots.  Doing PT  / rehab.  So has not been exercising regularly   Has very high lipids intolerent to Atrova and rosuva   Oct.  7, 2020: [name] is seen today for her moderate coronary artery disease, hyperlipidemia Feels better off the statins.   Tried lower and lower doses,  Also tired zetia .  Had a severe left leg fracture ( broke on 11 places )   Has been seen in the lipid clinic.  She has a standing prescription for Praluent.  She wants to wait until all of her muscle aches and pains have resolved before she restarts the Praluent.  I told her that this would probably be in a month.  Past Medical History:  Diagnosis Date  . Anxiety   . Arthritis    "back, neck" (03/08/2017)  . Asthma in child   . Bowel obstruction ([place])   . CAD (coronary artery disease), native coronary artery   . Chronic lower back pain   . Chronic neck pain   . Closed left tibial fracture   . Colon polyps   . Complication of anesthesia    "I was awake during one of my surgeries" reports that she felt them start the incision and heard them talking reports happened ~ 35 years ago    . Depression   . Diverticulosis   . Hyperlipidemia   . Irritable bowel syndrome   . Migraine    "used to be severe; now maybe 1/year or 2" (03/08/2017)  . Pneumonia    H!N!    Past Surgical  History:  Procedure Laterality Date  . ANKLE HARDWARE REMOVAL Left 07/27/2017   Hardware removal left ankle, takedown non-union with allograft bone graft, Intramedullary Nail (Left)  . APPENDECTOMY    . AUGMENTATION MAMMAPLASTY Bilateral   . BRACHIOPLASTY Bilateral    Arm Lift   . CARDIAC CATHETERIZATION    . CHOLECYSTECTOMY OPEN    . COLON SURGERY    . EXTERNAL FIXATION LEG Left 03/08/2017   Procedure: INTERNAL FIXATION LEFT ANKLE;  Surgeon: Newt Minion, MD;  Location: [facility];  Service: Orthopedics;  Laterality: Left;  . EYE MUSCLE SURGERY Bilateral X 2   "lazy eye; OR twice on each eye; Dr. Annamaria Boots"  . FRACTURE SURGERY    . HARDWARE REMOVAL Left 07/27/2017   Procedure: HARDWARE REMOVAL;  Surgeon: Mcarthur Rossetti, MD;  Location: [facility];  Service: Orthopedics;  Laterality: Left;  . LEFT HEART CATH AND CORONARY ANGIOGRAPHY N/A 10/27/2016   Procedure: Left Heart Cath and Coronary Angiography;  Surgeon: Peter M Martinique, MD;  Location: [facility];  Service: Cardiovascular;  Laterality: N/A;  . LEFT HEART CATHETERIZATION WITH CORONARY ANGIOGRAM N/A 08/16/2014   Procedure: LEFT HEART CATHETERIZATION WITH CORONARY ANGIOGRAM;  Surgeon: Sinclair Grooms, MD;  Location: [facility];  Service: Cardiovascular;  Laterality: N/A;  . NASAL SINUS SURGERY    . ORIF ANKLE FRACTURE Left 03/08/2017  . ORIF ANKLE FRACTURE Left 07/27/2017   Procedure: Hardware removal left ankle, takedown non-union with allograft bone graft, Intramedullary Nail;  Surgeon: Mcarthur Rossetti, MD;  Location: [facility];  Service: Orthopedics;  Laterality: Left;  . PARTIAL COLECTOMY  1970s  . TONSILLECTOMY       Current Outpatient Medications  Medication Sig Dispense Refill  . alendronate (FOSAMAX) 70 MG tablet Take 70 mg by mouth once a week.    . ALPRAZolam (XANAX) 0.5 MG tablet Take 0.5 mg by mouth 2 (two) times daily.     . cholecalciferol (VITAMIN D) 1000 units tablet Take 1,000 Units by mouth daily.    Marland Kitchen  FLUoxetine (PROZAC) 40 MG capsule TAKE ONE CAPSULE BY MOUTH ONCE DAILY 30 capsule 0  . gabapentin (NEURONTIN) 100 MG capsule Take 3 capsules (300 mg total) by mouth at bedtime. 90 capsule 1  .  propranolol (INDERAL) 10 MG tablet Take 1 tablet by mouth twice daily 180 tablet 0  . traZODone (DESYREL) 50 MG tablet TAKE 1 TABLET BY MOUTH AT BEDTIME (QUALITEST BRAND ONLY [phone]) 30 tablet 5   No current facility-administered medications for this visit.     Allergies:   Shellfish-derived products, Lipitor [atorvastatin], Crestor [rosuvastatin calcium], Pravastatin, Zetia [ezetimibe], and Codeine    Social History:  The patient  reports that she quit smoking about 9 years ago. Her smoking use included cigarettes. She has a 41.00 pack-year smoking history. She has never used smokeless tobacco. She reports that she does not drink alcohol or use drugs.   Family History:  The patient's family history includes Colon cancer in her maternal grandfather; Coronary artery disease in her father, mother, and sister; Diabetes in her sister; Healthy in her son; Heart disease in her father and mother; Hyperlipidemia in her mother; Hypertension in her mother; Liver disease in her son; Peripheral vascular disease in her sister; Stroke (age of onset: 61) in her sister.    ROS:  Please see the history of present illness.    Physical Exam: Blood pressure 98/70, pulse 70, height 5\' 2"  (1.575 m), weight 133 lb 1.9 oz (60.4 kg), SpO2 97 %.  GEN:  Well nourished, well developed in no acute distress HEENT: Normal NECK: No JVD; No carotid bruits LYMPHATICS: No lymphadenopathy CARDIAC: RRR , no murmurs, rubs, gallops RESPIRATORY:  Clear to auscultation without rales, wheezing or rhonchi  ABDOMEN: Soft, non-tender, non-distended MUSCULOSKELETAL:  No edema; No deformity  SKIN: Warm and dry NEUROLOGIC:  Alert and oriented x 3   EKG:  Oct. 2, 2020  Normal sinus rhythm at 70 beats a minute.  ST and T wave  abnormalities in the inferior and lateral leads.  No changes compared to previous EKG in 2018.  Recent Labs: No results found for requested labs within last 8760 hours.    Lipid Panel    Component Value Date/Time   CHOL 163 08/31/2017 0926   TRIG 58 08/31/2017 0926   HDL 80 08/31/2017 0926   CHOLHDL 2.0 08/31/2017 0926   CHOLHDL 3 04/24/2010 1413   VLDL 13.2 04/24/2010 1413   LDLCALC 71 08/31/2017 0926   LDLDIRECT 125.2 08/08/2009 1600      Wt Readings from Last 3 Encounters:  04/14/19 133 lb 1.9 oz (60.4 kg)  03/08/19 130 lb (59 kg)  04/29/18 132 lb (59.9 kg)      Other studies Reviewed: Additional studies/ records that were reviewed today include: . Review of the above records demonstrates:    ASSESSMENT AND PLAN:  1.  Chest pain . No further episodes of chest pain.  She has mild coronary artery disease..  2. Hyperlipidemia:  She did not tolerate the statin.  She is been seen in lipid clinic and is tried very low doses of statin and also has tried Publishing rights manager.  She now has a standing prescription for Praluent.  She wants to wait till her joint pain and muscle pains have resolved before starting Praluent.  I told her that she would probably be okay starting him next month.  We will plan on checking lipids, liver enzymes and basic metabolic profile approximately 3 months after she starts the Praluent.  I will see her again in 1 year.    Mertie Moores, MD  04/14/2019 8:33 AM    [facility] [address], [place], [place]  [address] Phone: [phone]; Fax: [phone]

## 2019-04-22 ENCOUNTER — Other Ambulatory Visit: Payer: Medicare Other

## 2019-04-23 ENCOUNTER — Other Ambulatory Visit: Payer: Self-pay | Admitting: Physician Assistant

## 2019-04-24 NOTE — Telephone Encounter (Signed)
Please advise 

## 2019-04-26 ENCOUNTER — Other Ambulatory Visit: Payer: Medicare Other

## 2019-05-01 ENCOUNTER — Ambulatory Visit: Payer: Medicare Other | Admitting: Orthopaedic Surgery

## 2019-05-10 ENCOUNTER — Other Ambulatory Visit: Payer: Self-pay

## 2019-05-10 DIAGNOSIS — Z20822 Contact with and (suspected) exposure to covid-19: Secondary | ICD-10-CM

## 2019-05-11 LAB — NOVEL CORONAVIRUS, NAA: SARS-CoV-2, NAA: NOT DETECTED

## 2019-05-12 ENCOUNTER — Telehealth: Payer: Self-pay | Admitting: Physician Assistant

## 2019-05-12 NOTE — Telephone Encounter (Signed)
Please advise 

## 2019-05-12 NOTE — Telephone Encounter (Signed)
Pt called in wanting to let Artis Delay know that she believes her back pain has been caused by satin drugs. Pt said she hasn't been taking those drugs for about 3 weeks now and the pain is getting better, at this time pt wouldn't like to get the mri done if pain is improving she will call back if pain returns.   [phone]

## 2019-05-15 ENCOUNTER — Other Ambulatory Visit: Payer: Medicare Other

## 2019-05-15 NOTE — Telephone Encounter (Signed)
OK 

## 2019-05-15 NOTE — Telephone Encounter (Signed)
MRI cancelled 

## 2019-05-15 NOTE — Telephone Encounter (Signed)
Cancel MRI please

## 2019-05-22 ENCOUNTER — Ambulatory Visit: Payer: Medicare Other | Admitting: Orthopaedic Surgery

## 2019-05-24 ENCOUNTER — Other Ambulatory Visit: Payer: Self-pay

## 2019-05-24 DIAGNOSIS — E782 Mixed hyperlipidemia: Secondary | ICD-10-CM

## 2019-05-24 DIAGNOSIS — I2510 Atherosclerotic heart disease of native coronary artery without angina pectoris: Secondary | ICD-10-CM

## 2019-05-29 ENCOUNTER — Other Ambulatory Visit: Payer: Self-pay | Admitting: Orthopaedic Surgery

## 2019-05-29 ENCOUNTER — Telehealth: Payer: Self-pay | Admitting: Neurology

## 2019-05-29 DIAGNOSIS — M4807 Spinal stenosis, lumbosacral region: Secondary | ICD-10-CM

## 2019-05-29 NOTE — Telephone Encounter (Signed)
Patient stated that since she has taken away other medication such as statins, and now she is still feeling the symptoms. No motivation to do anything. She states that is the only option left to be causing the depression. "Inside shaking" is getting worse but to nervous to increase since she is having depression. She would like to see if another medication is available?

## 2019-05-29 NOTE — Telephone Encounter (Signed)
Please advise on below  

## 2019-05-29 NOTE — Telephone Encounter (Signed)
Patient is aware, tried to transfer to the front but no answer. Please call to schedule

## 2019-05-29 NOTE — Telephone Encounter (Signed)
She can stop the medication, they can put her on my VV schedule on wed but I am also suggesting that maybe she just has depression?  She will know once meds are changed

## 2019-05-29 NOTE — Telephone Encounter (Signed)
Patient is calling in about the propanolol and she said she is having some depression issues taking it and wanting to see if she can change this. Her shaking is also worse. Pharm on file is correct. Thanks!

## 2019-05-29 NOTE — Telephone Encounter (Signed)
What makes her think that it is the propranolol causing depression?  She is on a very, very small dose of it.

## 2019-05-30 ENCOUNTER — Encounter: Payer: Self-pay | Admitting: Neurology

## 2019-05-30 NOTE — Progress Notes (Signed)
Virtual Visit via Telephone Note The purpose of this virtual visit is to provide medical care while limiting exposure to the novel coronavirus.    Consent was obtained for phone visit:  Yes.   Answered questions that patient had about telehealth interaction:  Yes.   I discussed the limitations, risks, security and privacy concerns of performing an evaluation and management service by telephone. I also discussed with the patient that there may be a patient responsible charge related to this service. The patient expressed understanding and agreed to proceed.  Pt location: Home Physician Location: home Name of referring provider:  Leighton Ruff, MD I connected with .[name] at patients initiation/request on 05/31/2019 at  8:45 AM EST by telephone and verified that I am speaking with the correct person using two identifiers.  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  She continues to get Botox for left hemifacial spasm, although her last injections were in June.  She canceled her September injections.  She states that she needs to r/s that.  In regards to her propranolol, she was on 10 mg 1-2 times per day.  It seemed to be working well, but she called me a few days ago stating that she thought it was causing her depression.  When asked why she thought that was causing it, she stated that she subtracted her other medications, and felt that this was the only 1 month contributing to depression.  She does state today that she may goes days without getting out of bed and "I don't know if this is from covid or from my meds."  No SI/HI.   Poor motivation but "I don't think woe is me and I don't cry."  "My internal shaking is worse."   Current Outpatient Medications:  .  alendronate (FOSAMAX) 70 MG tablet, Take 70 mg by mouth once a week., Disp: , Rfl:  .  ALPRAZolam  (XANAX) 0.5 MG tablet, Take 0.5 mg by mouth 2 (two) times daily. , Disp: , Rfl:  .  cholecalciferol (VITAMIN D) 1000 units tablet, Take 1,000 Units by mouth daily., Disp: , Rfl:  .  FLUoxetine (PROZAC) 40 MG capsule, TAKE ONE CAPSULE BY MOUTH ONCE DAILY, Disp: 30 capsule, Rfl: 0 .  gabapentin (NEURONTIN) 100 MG capsule, TAKE 3 CAPSULES BY MOUTH AT BEDTIME, Disp: 90 capsule, Rfl: 0 .  propranolol (INDERAL) 10 MG tablet, Take 1 tablet by mouth twice daily, Disp: 180 tablet, Rfl: 0 .  traZODone (DESYREL) 50 MG tablet, TAKE 1 TABLET BY MOUTH AT BEDTIME (QUALITEST BRAND ONLY [phone]), Disp: 30 tablet, Rfl: 5    Observations/Objective:   Vitals:   05/30/19 1407  BP: 98/60  Weight: 130 lb (59 kg)     Assessment and Plan:   1.  Essential tremor, mild  -I really think that anxiety is the primary contributor here, esp with the internal tremor.  I obviously could not examine her today, but I have examined her in the past and found tremor to be very mild.  She wants to go off of the propranolol as she thinks that this is causing depression.  It certainly can contribute to that, but generally not in dosages that are small like this.  My suspicion is that the patient just has anxiety and depression that needs better treated.  However, we will go ahead and d/c  the propranolol 10 mg bid  -Patient is to call me in 3 weeks and let me know how she is doing in terms of depression.  I suspect that the internal tremor is really anxiety, and that tremor medicine is not going to help.  We discussed this today.  She will let me know in 3 weeks how she is doing. 2.  Hemifacial spasm  -Last injections were in June of Botox.  She canceled her September injections.  She does not have future injections scheduled.   She states that she would like to be rescheduled and we will get that done.  Follow Up Instructions:    -I discussed the assessment and treatment plan with the patient. The patient was provided an  opportunity to ask questions and all were answered. The patient agreed with the plan and demonstrated an understanding of the instructions.   The patient was advised to call back or seek an in-person evaluation if the symptoms worsen or if the condition fails to improve as anticipated.    Total Time spent in visit with the patient was:  12 min, of which 100% of the time was spent in counseling as above.   Pt understands and agrees with the plan of care outlined.     Alonza Bogus, DO

## 2019-05-31 ENCOUNTER — Telehealth (INDEPENDENT_AMBULATORY_CARE_PROVIDER_SITE_OTHER): Payer: Medicare Other | Admitting: Neurology

## 2019-05-31 ENCOUNTER — Other Ambulatory Visit: Payer: Self-pay

## 2019-05-31 VITALS — BP 98/60 | Wt 130.0 lb

## 2019-05-31 DIAGNOSIS — F330 Major depressive disorder, recurrent, mild: Secondary | ICD-10-CM | POA: Diagnosis not present

## 2019-05-31 DIAGNOSIS — Z86010 Personal history of colonic polyps: Secondary | ICD-10-CM | POA: Insufficient documentation

## 2019-05-31 DIAGNOSIS — Z9882 Breast implant status: Secondary | ICD-10-CM | POA: Insufficient documentation

## 2019-05-31 DIAGNOSIS — M810 Age-related osteoporosis without current pathological fracture: Secondary | ICD-10-CM | POA: Insufficient documentation

## 2019-05-31 DIAGNOSIS — E559 Vitamin D deficiency, unspecified: Secondary | ICD-10-CM | POA: Insufficient documentation

## 2019-05-31 DIAGNOSIS — G250 Essential tremor: Secondary | ICD-10-CM | POA: Diagnosis not present

## 2019-05-31 DIAGNOSIS — G720 Drug-induced myopathy: Secondary | ICD-10-CM | POA: Insufficient documentation

## 2019-05-31 DIAGNOSIS — F411 Generalized anxiety disorder: Secondary | ICD-10-CM

## 2019-05-31 DIAGNOSIS — N183 Chronic kidney disease, stage 3 unspecified: Secondary | ICD-10-CM | POA: Insufficient documentation

## 2019-05-31 DIAGNOSIS — G4700 Insomnia, unspecified: Secondary | ICD-10-CM | POA: Insufficient documentation

## 2019-06-05 ENCOUNTER — Telehealth: Payer: Self-pay | Admitting: Cardiovascular Disease

## 2019-06-05 NOTE — Telephone Encounter (Signed)
Returned call to pt, she states her anxiety level has doubled since starting Repatha shots. Her husband is on the phone as well and states her anxiety level is "100x greater" over the past 3-4 weeks. Pt does have documented anxiety being treated with fluoxetine, trazodone, and Xanax. Pt reports feeling "more shaky on the inside and outside," not wanting to do anything, and more episodes of crying. Day to day anxiety has worsened, not related to time of injections or physical act of giving injections. There have been no changes with her anxiety/depression meds and no compliance issues noted.   Asked pt if her symptoms have improved at all since trialing off her propranolol after 11/18 neuro visit. Pt states she never stopped propranolol and that she couldn't go a day without it. States she is still taking propranolol 10mg  BID. Will forward to Dr Tat as an Juluis Rainier.  States she gave a Repatha shot yesterday. Discussed skipping her next Repatha dose to see if she notices any improvement in her symptoms. She is agreeable to this. Discussed that Repatha is not interacting with any of her current meds and is not known to cause any changes in her mood or feeling shaky. She will give [place] a call in 3-4 weeks to let us know how she's doing. If she does feel better off of the Repatha, could try resuming pravastatin and change Zetia to Nexlizet (LDL on pravastatin 20mg  daily and Zetia 10mg  daily was 87 - should see an additional 20% LDL lowering by changing Zetia to Nexlizet).

## 2019-06-05 NOTE — Telephone Encounter (Signed)
New Message  Pt c/o medication issue:  1. Name of Medication: Repatha Injection   2. How are you currently taking this medication (dosage and times per day)? As directed  3. Are you having a reaction (difficulty breathing--STAT)? Yes  4. What is your medication issue? Patient states that the Repatha is causing her to have anxiety and it makes her have the urge to cry and she can not continue to take it with it having those side effects. Please give patient a call back to discuss.

## 2019-06-06 NOTE — Telephone Encounter (Signed)
Patient called to report more symptoms. She states she now has a rash on her neck and chest (like hives),  and that it is very itchy. She doesn't need to be called back, she just wanted the symptoms to be noted.

## 2019-06-06 NOTE — Telephone Encounter (Signed)
Noted - will see if sx improve with Repatha drug holiday.

## 2019-06-13 ENCOUNTER — Other Ambulatory Visit: Payer: Medicare Other

## 2019-06-19 ENCOUNTER — Telehealth: Payer: Self-pay | Admitting: Cardiovascular Disease

## 2019-06-19 NOTE — Telephone Encounter (Signed)
New Message    Pt would like to leave a message with the Pharmacist  Pt says the Repatha was not the cause of her anxiety and that she is going to continue taking the medication.  She realized she had missed 4 days of her anxiety meds and that is what caused her anxiety attack  She would like for the Pharmacist to call her to get the [place] approved again   She says she has one shot left     Please call back

## 2019-06-19 NOTE — Telephone Encounter (Signed)
Pt needs to have lipid panel checked before we can reauthorize her prior auth for Repatha that expires on 12/9. Pt was previously scheduled for this lab work on 12/1 but she canceled and did not reschedule.  Tried to call pt 3x, phone doesn't ring but seems like it's automatically picked up but no one ever speaks, unable to leave message. Will try back later.

## 2019-06-19 NOTE — Telephone Encounter (Signed)
Patient returned call and she said she is feeling better. She said she missed about four days of her fluoxetine 40 MG and she didn't realize it until she checked her pill container. Since then, she has been taking all of her medications. Patient declined an appointment at this time.

## 2019-06-20 NOTE — Telephone Encounter (Signed)
Called pt - rescheduled labs for 12/10. Will submit prior authorization renewal once labs result.

## 2019-06-21 MED ORDER — REPATHA SURECLICK 140 MG/ML ~~LOC~~ SOAJ: 1.0000 "pen " | SUBCUTANEOUS | 11 refills | Status: DC

## 2019-06-21 NOTE — Telephone Encounter (Signed)
Insurance company accepted lipid panel drawn from PCP in August - [place] approved through 07/12/20. Pt aware, lab appt canceled for tomorrow.

## 2019-06-22 ENCOUNTER — Other Ambulatory Visit: Payer: Medicare Other

## 2019-06-23 ENCOUNTER — Other Ambulatory Visit: Payer: Self-pay

## 2019-06-23 ENCOUNTER — Encounter: Payer: Self-pay | Admitting: Internal Medicine

## 2019-06-23 ENCOUNTER — Ambulatory Visit (INDEPENDENT_AMBULATORY_CARE_PROVIDER_SITE_OTHER): Payer: Medicare Other | Admitting: Internal Medicine

## 2019-06-23 VITALS — BP 108/60 | HR 71 | Ht 62.0 in | Wt 131.0 lb

## 2019-06-23 DIAGNOSIS — M810 Age-related osteoporosis without current pathological fracture: Secondary | ICD-10-CM | POA: Diagnosis not present

## 2019-06-23 LAB — VITAMIN D 25 HYDROXY (VIT D DEFICIENCY, FRACTURES): VITD: 53.73 ng/mL (ref 30.00–100.00)

## 2019-06-23 LAB — PHOSPHORUS: Phosphorus: 4.7 mg/dL — ABNORMAL HIGH (ref 2.3–4.6)

## 2019-06-23 NOTE — Progress Notes (Addendum)
Patient ID: [name], female   DOB: July 19, 1945, 73 y.o.   MRN: [id number]   This visit occurred during the SARS-CoV-2 public health emergency.  Safety protocols were in place, including screening questions prior to the visit, additional usage of staff PPE, and extensive cleaning of exam room while observing appropriate contact time as indicated for disinfecting solutions.   HPI  [name] is a 73 y.o.-year-old female, referred by her PCP, Dr.Lawrence Drema [place], for mananagement of osteoporosis (OP).  I previously saw the patient - in 08/2015 for an elevated free T4.  Pt was dx with OP in 2016.  I reviewed pt's DXA scans: Date L1-L3 T score FN T score 33% distal Radius  05/19/2019 ([facility])  -2.2 RFN: -2.8 LFN: -2.7 n/a  05/03/2017 ([facility])  -2.2 (-2.4 for comparison with 2020) RFN: -2.6 LFN: -2.9 n/a  01/17/2015 ([facility])  -1.8 RFN: -2.4 LFN: -2.6 n/a  2014  -1.8 RFN: -2.3 LFN: -2.4 n/a   She has a history of fractures of L tibia-fibula when stepping off a treadmill 02/2017 (had a rod placed) and also nasal bones at the same time.  Occasional falls - tripping.  No dizziness/vertigo/orthostasis/poor vision.   Previous OP treatments:  - Fosamax 70 mg weekly off and on for 20 years.   No h/o vitamin D deficiency. Reviewed available vit D levels: 02/16/2019: Vitamin D 41.74 04/15/2018: Vitamin D 31.4 Lab Results  Component Value Date   VD25OH 41.72 08/19/2015   Pt is on: - vitamin D 1000 units daily  No weight bearing exercises, but walks 4-5x a week - 2 miles, with her husband.  She gets steroid injections every 6 mo - spine (DDD, spinal stenosis) - for years. Stopped 2017.  She does not take high vitamin A doses.  Menopause was at  73 y/o. No HRT.   No FH of osteoporosis.  No h/o hyper/hypocalcemia or hyperparathyroidism. No h/o kidney stones. 11/11/2018: Calcium 9.4 (8.6-10.3) Lab Results  Component Value Date   CALCIUM 9.3 07/27/2017   CALCIUM 9.9 06/01/2017    CALCIUM 8.9 03/08/2017   CALCIUM 10.0 02/24/2017   CALCIUM 9.6 12/01/2016   CALCIUM 9.8 10/26/2016   CALCIUM 9.4 01/10/2013   CALCIUM 9.2 08/23/2012   CALCIUM 9.3 07/22/2011   CALCIUM 9.3 07/08/2010   Most recent TFT levels have been normal:  Lab Results  Component Value Date   TSH 0.67 08/19/2015   TSH 0.94 08/23/2012   TSH 0.56 08/03/2011   TSH 0.67 04/24/2010   TSH 0.57 08/08/2009   She has CKD stage III. Last BUN/Cr: 11/11/2018: 38/1.13, GFR 47 Lab Results  Component Value Date   BUN 27 (H) 07/27/2017   CREATININE 1.04 (H) 07/27/2017   Has a h/o HL - could not tolerate stating >> significant mm pain. On Rpatha now.  ROS: Constitutional: no weight gain, no weight loss, no fatigue, + subjective hyperthermia, + subjective hypothermia, no nocturia Eyes: no blurry vision, no xerophthalmia ENT: no sore throat, no nodules palpated in neck, no dysphagia, no odynophagia, no hoarseness, no tinnitus, no hypoacusis Cardiovascular: no CP, no SOB, no palpitations, no leg swelling Respiratory: no cough, no SOB, no wheezing Gastrointestinal: no N, no V, no D, + C, no acid reflux Musculoskeletal: no muscle, no joint aches Skin: no rash, + hair loss Neurological: + tremors, no numbness or tingling/no dizziness/no HAs Psychiatric: + Depression, + anxiety  I reviewed pt's medications, allergies, PMH, social hx, family hx, and changes were documented in the history of  present illness. Otherwise, unchanged from my initial visit note.  Past Medical History:  Diagnosis Date  . Anxiety   . Arthritis    "back, neck" (03/08/2017)  . Asthma in child   . Bowel obstruction ([facility])   . CAD (coronary artery disease), native coronary artery   . Chronic lower back pain   . Chronic neck pain   . Closed left tibial fracture   . Colon polyps   . Complication of anesthesia    "I was awake during one of my surgeries" reports that she felt them start the incision and heard them talking reports happened  ~ 35 years ago    . Depression   . Diverticulosis   . Hyperlipidemia   . Irritable bowel syndrome   . Migraine    "used to be severe; now maybe 1/year or 2" (03/08/2017)  . Pneumonia    H!N!   Past Surgical History:  Procedure Laterality Date  . ANKLE HARDWARE REMOVAL Left 07/27/2017   Hardware removal left ankle, takedown non-union with allograft bone graft, Intramedullary Nail (Left)  . APPENDECTOMY    . AUGMENTATION MAMMAPLASTY Bilateral   . BRACHIOPLASTY Bilateral    Arm Lift   . CARDIAC CATHETERIZATION    . CHOLECYSTECTOMY OPEN    . COLON SURGERY    . EXTERNAL FIXATION LEG Left 03/08/2017   Procedure: INTERNAL FIXATION LEFT ANKLE;  Surgeon: Newt Minion, MD;  Location: [facility];  Service: Orthopedics;  Laterality: Left;  . EYE MUSCLE SURGERY Bilateral X 2   "lazy eye; OR twice on each eye; Dr. Annamaria Boots"  . FRACTURE SURGERY    . HARDWARE REMOVAL Left 07/27/2017   Procedure: HARDWARE REMOVAL;  Surgeon: Mcarthur Rossetti, MD;  Location: [facility];  Service: Orthopedics;  Laterality: Left;  . LEFT HEART CATH AND CORONARY ANGIOGRAPHY N/A 10/27/2016   Procedure: Left Heart Cath and Coronary Angiography;  Surgeon: Peter M Martinique, MD;  Location: [facility];  Service: Cardiovascular;  Laterality: N/A;  . LEFT HEART CATHETERIZATION WITH CORONARY ANGIOGRAM N/A 08/16/2014   Procedure: LEFT HEART CATHETERIZATION WITH CORONARY ANGIOGRAM;  Surgeon: Sinclair Grooms, MD;  Location: [facility] CATH LAB;  Service: Cardiovascular;  Laterality: N/A;  . NASAL SINUS SURGERY    . ORIF ANKLE FRACTURE Left 03/08/2017  . ORIF ANKLE FRACTURE Left 07/27/2017   Procedure: Hardware removal left ankle, takedown non-union with allograft bone graft, Intramedullary Nail;  Surgeon: Mcarthur Rossetti, MD;  Location: [facility];  Service: Orthopedics;  Laterality: Left;  . PARTIAL COLECTOMY  1970s  . TONSILLECTOMY     Social History   Socioeconomic History  . Marital status: Married    Spouse name: Not on file   . Number of children: 2  . Years of education: Not on file  . Highest education level: Bachelor's degree (e.g., BA, AB, BS)  Occupational History  . Occupation: Retired Merchant navy officer)     Employer: RETIRED  Tobacco Use  . Smoking status: Former Smoker    Packs/day: 1.00    Years: 41.00    Pack years: 41.00    Types: Cigarettes    Quit date: 01/10/2010    Years since quitting: 9.4  . Smokeless tobacco: Never Used  Substance and Sexual Activity  . Alcohol use: No  . Drug use: No  . Sexual activity: Not Currently    Partners: Female  Other Topics Concern  . 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   Marriage in good health.      Physician roster   Gyn- Helene Shoe, NP   Opthal- Dr Annamaria Boots (pediatric opthal)   GI- dr Adriana Simas- Dr Gladstone Lighter   ENT- Dr Ernesto Rutherford            Social Determinants of Health   Financial Resource Strain:   . Difficulty of Paying Living Expenses: Not on file  Food Insecurity:   . Worried About Charity fundraiser in the Last Year: Not on file  . Ran Out of Food in the Last Year: Not on file  Transportation Needs:   . Lack of Transportation (Medical): Not on file  . Lack of Transportation (Non-Medical): Not on file  Physical Activity:   . Days of Exercise per Week: Not on file  . Minutes of Exercise per Session: Not on file  Stress:   . Feeling of Stress : Not on file  Social Connections:   . Frequency of Communication with Friends and Family: Not on file  . Frequency of Social Gatherings with Friends and Family: Not on file  . Attends Religious Services: Not on file  . Active Member of Clubs or Organizations: Not on file  . Attends Archivist Meetings: Not on file  . Marital Status: Not on file  Intimate Partner Violence:   . Fear of Current  or Ex-Partner: Not on file  . Emotionally Abused: Not on file  . Physically Abused: Not on file  . Sexually Abused: Not on file   Current Outpatient Medications on File Prior to Visit  Medication Sig Dispense Refill  . alendronate (FOSAMAX) 70 MG tablet Take 70 mg by mouth once a week.    . ALPRAZolam (XANAX) 0.5 MG tablet Take 0.5 mg by mouth 2 (two) times daily.     . cholecalciferol (VITAMIN D) 1000 units tablet Take 1,000 Units by mouth daily.    . Evolocumab (REPATHA SURECLICK) [id number] MG/ML SOAJ Inject 1 pen into the skin every 14 (fourteen) days. 2 pen 11  . FLUoxetine (PROZAC) 40 MG capsule TAKE ONE CAPSULE BY MOUTH ONCE DAILY 30 capsule 0  . gabapentin (NEURONTIN) 100 MG capsule TAKE 3 CAPSULES BY MOUTH AT BEDTIME 90 capsule 0  . propranolol (INDERAL) 10 MG tablet Take 1 tablet by mouth twice daily 180 tablet 0  . traZODone (DESYREL) 50 MG tablet TAKE 1 TABLET BY MOUTH AT BEDTIME (QUALITEST BRAND ONLY [phone]) 30 tablet 5   No current facility-administered medications on file prior to visit.   Allergies  Allergen Reactions  . Shellfish-Derived Products Anaphylaxis, Shortness Of Breath and Swelling    Throat swells; Iodine is ok  . Lipitor [Atorvastatin] Nausea Only and Other (See Comments)    Fatigue and Lethargy. Leg, back,Joint, and muscle pain. Per patient "My stool was a muddy clay color and I having diarrhea" pt states "I never want to take another statin again"  . Crestor [Rosuvastatin Calcium]     UNSPECIFIED REACTION   . Pravastatin     fatigue  . Zetia [Ezetimibe]     fatigue  . Codeine Nausea And Vomiting   Family History  Problem Relation Age of Onset  . Heart disease Mother        CHF  . Coronary artery disease Mother   . Hypertension Mother   .  Hyperlipidemia Mother   . Heart disease Father        CHF  . Coronary artery disease Father   . Diabetes Sister        with all the complications  . Coronary artery disease Sister   . Stroke Sister 60        CVA  . Peripheral vascular disease Sister   . Colon cancer Maternal Grandfather   . Healthy Son   . Liver disease Son        liver failure   . Cancer Neg Hx        breast or colon    PE: BP 108/60   Pulse 71   Ht 5\' 2"  (1.575 m)   Wt 131 lb (59.4 kg)   SpO2 97%   BMI 23.96 kg/m  Wt Readings from Last 3 Encounters:  06/23/19 131 lb (59.4 kg)  05/30/19 130 lb (59 kg)  04/14/19 133 lb 1.9 oz (60.4 kg)   Constitutional: Normal weight, in NAD. No kyphosis. Eyes: PERRLA, EOMI, no exophthalmos ENT: moist mucous membranes, no thyromegaly, no cervical lymphadenopathy Cardiovascular: RRR, No MRG Respiratory: CTA B Gastrointestinal: abdomen soft, NT, ND, BS+ Musculoskeletal: no deformities, strength intact in all 4 Skin: moist, warm, no rashes Neurological: no tremor with outstretched hands, DTR normal in all 4  Assessment: 1. Osteoporosis  Plan: 1. Osteoporosis - likely postmenopausal/age-related (she has a history of early menopause without HRT), also, possibly steroid induced (she received spinal injections with steroids for many years until she stopped after our last visit in 2017) - Discussed about increased risk of fracture, depending on the T score, greatly increased when the T score is lower than -2.5, but it is actually a continuum and -2.5 should not be regarded as an absolute threshold. We reviewed her DXA scan reports since 2014  together, and I explained that based on the T scores, she has an increased risk for fractures (with the smallest, approximately 1 intubations will have a fracture).  She already had shattered lower leg after stepping down from the treadmill (this was elevated) in 2018 and this was slow to heal.   - we reviewed her dietary and supplemental calcium and vitamin D intake. I recommended to make sure she gets 1000-1200 mg of calcium daily preferentially from diet and I will check vit D today to see if she needs further supplementation.  She is currently on  1000 units daily. - discussed fall precautions   - given handout from [facility]: weight bearing exercises - advised to do this every day or at least 5/7 days.  As of now, she is only walking, we discussed that this may not be now to maintain/increase her bone density. - she is not smoking or drinking >2 drinks of alcohol a day. - We discussed about the different medication classes, benefits and side effects (including atypical fractures and ONJ - no dental workup in progress or planned).  We also discussed about side effects for bone anabolic therapy. - I explained that first options are sq denosumab (Prolia) sq every 6 months or teriparatide/Abaloparatide (which are daily subcu medication).  I would not suggest Reclast and she has been bisphosphonates for many years.  Also, I would leave Romosozumab (monthly) as a last resort.  - Pt was given reading information about Prolia and teriparatide, and I explained the mechanism of action and expected benefits.  - I suggested to obtain an N-telopeptide to see if this is suppressed or  not.  If this is suppressed, she may not benefit from Prolia as much, and she would probably benefit from an anabolic agent.  If this is high, I would suggest Prolia, and an anabolic agent would not be necessary. - Will check the following labs today: Orders Placed This Encounter  Procedures  . VITAMIN D 25 Hydroxy (Vit-D Deficiency, Fractures)  . BASIC METABOLIC PANEL WITH GFR  . Calcium, urine, 24 hour  . Creatinine, urine, 24 hour  . Parathyroid hormone, intact (no Ca)  . Phosphorus  . N-Telopeptide, Serum  - will check a new DXA scan in 2 years after starting medication-  the first indication that the treatment is working is her not having anymore fractures. DEXA scan changes are secondary: unchanged or slightly higher T-scores are desirable - will see pt back in a year, however, if we decide to start an anabolic agent, I will see her back in 3  months.  Component     Latest Ref Rng & Units 06/23/2019  Glucose     65 - 99 mg/dL 91  BUN     7 - 25 mg/dL 32 (H)  Creatinine     0.60 - 0.93 mg/dL 1.17 (H)  GFR, Est Non African American     > OR = 60 mL/min/1.69m2 47 (L)  GFR, Est African American     > OR = 60 mL/min/1.16m2 54 (L)  BUN/Creatinine Ratio     6 - 22 (calc) 27 (H)  Sodium     135 - 146 mmol/L 140  Potassium     3.5 - 5.3 mmol/L 4.8  Chloride     98 - 110 mmol/L 102  CO2     20 - 32 mmol/L 26  Calcium     8.6 - 10.4 mg/dL 10.1  VITD     30.00 - 100.00 ng/mL 53.73  PTH, Intact     15 - 65 pg/mL 16  Phosphorus     2.3 - 4.6 mg/dL 4.7 (H)  NTX Telopeptide     6.2 - 19.0 nmol BCE/L 6.3   N-telopeptide is very suppressed.  Therefore, I do not feel that she would necessarily benefit from Prolia.  I would suggest Forteo or Tymlos for 2 years.  In this case, I would advise her to go ahead with a urine collection for calcium.  Also, will change the next appointment time to 3 months from now.  Philemon Kingdom, MD PhD [facility] Endocrinology

## 2019-06-23 NOTE — Patient Instructions (Addendum)
Please stop at the lab.  Please collect a 24 h urine.  Please come back for a follow-up appointment in 1 year.  How Can I Prevent Falls? Men and women with osteoporosis need to take care not to fall down. Falls can break bones. Some reasons people fall are: Poor vision  Poor balance  Certain diseases that affect how you walk  Some types of medicine, such as sleeping pills.  Some tips to help prevent falls outdoors are: Use a cane or walker  Wear rubber-soled shoes so you don't slip  Walk on grass when sidewalks are slippery  In winter, put salt or kitty litter on icy sidewalks.  Some ways to help prevent falls indoors are: Keep rooms free of clutter, especially on floors  Use plastic or carpet runners on slippery floors  Wear low-heeled shoes that provide good support  Do not walk in socks, stockings, or slippers  Be sure carpets and area rugs have skid-proof backs or are tacked to the floor  Be sure stairs are well lit and have rails on both sides  Put grab bars on bathroom walls near tub, shower, and toilet  Use a rubber bath mat in the shower or tub  Keep a flashlight next to your bed  Use a sturdy step stool with a handrail and wide steps  Add more lights in rooms (and night lights) Buy a cordless phone to keep with you so that you don't have to rush to the phone       when it rings and so that you can call for help if you fall.   (adapted from http://www.niams.Nightlife[employer]views.se)  Exercise for Strong Bones (from [place]) There are two types of exercises that are important for building and maintaining bone density:  weight-bearing and muscle-strengthening exercises. Weight-bearing Exercises These exercises include activities that make you move against gravity while staying upright. Weight-bearing exercises can be high-impact or low-impact. High-impact weight-bearing exercises help build bones and keep them  strong. If you have broken a bone due to osteoporosis or are at risk of breaking a bone, you may need to avoid high-impact exercises. If you're not sure, you should check with your healthcare provider. Examples of high-impact weight-bearing exercises are: . Dancing . Doing high-impact aerobics . Hiking . Jogging/running . Jumping Rope . Stair climbing . Tennis Low-impact weight-bearing exercises can also help keep bones strong and are a safe alternative if you cannot do high-impact exercises. Examples of low-impact weight-bearing exercises are: . Using elliptical training machines . Doing low-impact aerobics . Using stair-step machines . Fast walking on a treadmill or outside Muscle-Strengthening Exercises These exercises include activities where you move your body, a weight or some other resistance against gravity. They are also known as resistance exercises and include: . Lifting weights . Using elastic exercise bands . Using weight machines . Lifting your own body weight . Functional movements, such as standing and rising up on your toes Yoga and Pilates can also improve strength, balance and flexibility. However, certain positions may not be safe for people with osteoporosis or those at increased risk of broken bones. For example, exercises that have you bend forward may increase the chance of breaking a bone in the spine. A physical therapist should be able to help you learn which exercises are safe and appropriate for you. Non-Impact Exercises Non-impact exercises can help you to improve balance, posture and how well you move in everyday activities. These exercises can also help to increase muscle  strength and decrease the risk of falls and broken bones. Some of these exercises include: . Balance exercises that strengthen your legs and test your balance, such as Tai Chi, can decrease your risk of falls. . Posture exercises that improve your posture and reduce rounded or "sloping"  shoulders can help you decrease the chance of breaking a bone, especially in the spine. . Functional exercises that improve how well you move can help you with everyday activities and decrease your chance of falling and breaking a bone. For example, if you have trouble getting up from a chair or climbing stairs, you should do these activities as exercises. A physical therapist can teach you balance, posture and functional exercises. Starting a New Exercise Program If you haven't exercised regularly for a while, check with your healthcare provider before beginning a new exercise program--particularly if you have health problems such as heart disease, diabetes or high blood pressure. If you're at high risk of breaking a bone, you should work with a physical therapist to develop a safe exercise program. Once you have your healthcare provider's approval, start slowly. If you've already broken bones in the spine because of osteoporosis, be very careful to avoid activities that require reaching down, bending forward, rapid twisting motions, heavy lifting and those that increase your chance of a fall. As you get started, your muscles may feel sore for a day or two after you exercise. If soreness lasts longer, you may be working too hard and need to ease up. Exercises should be done in a pain-free range of motion. How Much Exercise Do You Need? Weight-bearing exercises 30 minutes on most days of the week. Do a 30-minutesession or multiple sessions spread out throughout the day. The benefits to your bones are the same.   Muscle-strengthening exercises Two to three days per week. If you don't have much time for strengthening/resistance training, do small amounts at a time. You can do just one body part each day. For example do arms one day, legs the next and trunk the next. You can also spread these exercises out during your normal day.  Balance, posture and functional exercises Every day or as often as needed. You  may want to focus on one area more than the others. If you have fallen or lose your balance, spend time doing balance exercises. If you are getting rounded shoulders, work more on posture exercises. If you have trouble climbing stairs or getting up from the couch, do more functional exercises. You can also perform these exercises at one time or spread them during your day. Work with a phyiscal therapist to learn the right exercises for you.    Patient information (Up-to-Date): Collection of a 24-hour urine specimen  - You should collect every drop of urine during each 24-hour period. It does not matter how much or little urine is passed each time, as long as every drop is collected. - Begin the urine collection in the morning after you wake up, after you have emptied your bladder for the first time. - Urinate (empty the bladder) for the first time and flush it down the toilet. Note the exact time (eg, 6:15 AM). You will begin the urine collection at this time. - Collect every drop of urine during the day and night in an empty collection bottle. Store the bottle at room temperature or in the refrigerator. - If you need to have a bowel movement, any urine passed with the bowel movement should be collected. Try not  to include feces with the urine collection. If feces does get mixed in, do not try to remove the feces from the urine collection bottle. - Finish by collecting the first urine passed the next morning, adding it to the collection bottle. This should be within ten minutes before or after the time of the first morning void on the first day (which was flushed). In this example, you would try to void between 6:05 and 6:25 on the second day. - If you need to urinate one hour before the final collection time, drink a full glass of water so that you can void again at the appropriate time. If you have to urinate 20 minutes before, try to hold the urine until the proper time. - Please note the exact time of  the final collection, even if it is not the same time as when collection began on day 1. - The bottle(s) may be kept at room temperature for a day or two, but should be kept cool or refrigerated for longer periods of time.  Denosumab: Patient drug information (Up-to-date) Copyright [phone] [place] rights reserved.  Brand Names: [place]  ProliaDel[place] See What is this drug used for?  .It is used to treat soft, brittle bones (osteoporosis).  .It is used for bone growth.  .It is used when treating some cancers.  .It may be given to you for other reasons. Talk with the doctor. What do I need to tell my doctor BEFORE I take this drug?  All products:  .If you have an allergy to denosumab or any other part of this drug.  .If you are allergic to any drugs like this one, any other drugs, foods, or other substances. Tell your doctor about the allergy and what signs you had, like rash; hives; itching; shortness of breath; wheezing; cough; swelling of face, lips, tongue, or throat; or any other signs.  .If you have low calcium levels.  ProliaT:  .If you are pregnant or may be pregnant. Do not take this drug if you are pregnant.  This is not a list of all drugs or health problems that interact with this drug.  Tell your doctor and pharmacist about all of your drugs (prescription or OTC, natural products, vitamins) and health problems. You must check to make sure that it is safe for you to take this drug with all of your drugs and health problems. Do not start, stop, or change the dose of any drug without checking with your doctor. What are some things I need to know or do while I take this drug?  All products:  .Tell dentists, surgeons, and other doctors that you use this drug.  .This drug may raise the chance of a broken leg. Talk with your doctor.  .Have your blood work checked. Talk with your doctor.  .Have a bone density test. Talk with your doctor.  .Take calcium and vitamin D as you were  told by your doctor.  .Have a dental exam before starting this drug.  .Take good care of your teeth. See a dentist often.  .If you smoke, talk with your doctor.  .Do not give to a child. Talk with your doctor.  .Tell your doctor if you are breast-feeding. You will need to talk about any risks to your baby.  Xgeva:  .This drug may cause harm to the unborn baby if you take it while you are pregnant. If you get pregnant while taking this drug, call your doctor right away.  ProliaT:  .Very bad infections have been reported with use of this drug. If you have any infection, are taking antibiotics now or in the recent past, or have many infections, talk with your doctor.  .You may have more chance of getting an infection. Wash hands often. Stay away from people with infections, colds, or flu.  .Use birth control that you can trust to prevent pregnancy while taking this drug.  .If you are a man and your sex partner is pregnant or gets pregnant at any time while you are being treated, talk with your doctor. What are some side effects that I need to call my doctor about right away?  WARNING/CAUTION: Even though it may be rare, some people may have very bad and sometimes deadly side effects when taking a drug. Tell your doctor or get medical help right away if you have any of the following signs or symptoms that may be related to a very bad side effect:  All products:  .Signs of an allergic reaction, like rash; hives; itching; red, swollen, blistered, or peeling skin with or without fever; wheezing; tightness in the chest or throat; trouble breathing or talking; unusual hoarseness; or swelling of the mouth, face, lips, tongue, or throat.  .Signs of low calcium levels like muscle cramps or spasms, numbness and tingling, or seizures.  .Mouth sores.  .Any new or strange groin, hip, or thigh pain.  .This drug may cause jawbone problems. The chance may be higher the longer you take this drug. The chance may be  higher if you have cancer, dental problems, dentures that do not fit well, anemia, blood clotting problems, or an infection. The chance may also be higher if you are having dental work or if you are getting chemo, some steroid drugs, or radiation. Call your doctor right away if you have jaw swelling or pain.  Xgeva:  .Not hungry.  .Muscle pain or weakness.  .Seizures.  .Shortness of breath.  ProliaT:  .Signs of infection. These include a fever of 100.33F (38C) or higher, chills, very bad sore throat, ear or sinus pain, cough, more sputum or change in color of sputum, pain with passing urine, mouth sores, wound that will not heal, or anal itching or pain.  .Signs of a pancreas problem (pancreatitis) like very bad stomach pain, very bad back pain, or very bad upset stomach or throwing up.  .Chest pain.  .A heartbeat that does not feel normal.  .Very bad skin irritation.  .Feeling very tired or weak.  .Bladder pain or pain when passing urine or change in how much urine is passed.  .Passing urine often.  .Swelling in the arms or legs. What are some other side effects of this drug?  All drugs may cause side effects. However, many people have no side effects or only have minor side effects. Call your doctor or get medical help if any of these side effects or any other side effects bother you or do not go away:  Xgeva:  .Feeling tired or weak.  Marland KitchenHeadache.  Marland KitchenUpset stomach or throwing up.  .Loose stools (diarrhea).  .Cough.  ProliaT:  .Back pain.  .Muscle or joint pain.  .Sore throat.  .Runny nose.  .Pain in arms or legs.  These are not all of the side effects that may occur. If you have questions about side effects, call your doctor. Call your doctor for medical advice about side effects.  You may report side effects to your national health agency. How is this drug  best taken?  Use this drug as ordered by your doctor. Read and follow the dosing on the label closely.  .It is given as a  shot into the fatty part of the skin. What do I do if I miss a dose?  .Call the doctor to find out what to do. How do I store and/or throw out this drug?  Marland KitchenThis drug will be given to you in a hospital or doctor's office. You will not store it at home.  Marland KitchenKeep all drugs out of the reach of children and pets.  .Check with your pharmacist about how to throw out unused drugs.  General drug facts  .If your symptoms or health problems do not get better or if they become worse, call your doctor.  .Do not share your drugs with others and do not take anyone else's drugs.  Marland KitchenKeep a list of all your drugs (prescription, natural products, vitamins, OTC) with you. Give this list to your doctor.  .Talk with the doctor before starting any new drug, including prescription or OTC, natural products, or vitamins.  .Some drugs may have another patient information leaflet. If you have any questions about this drug, please talk with your doctor, pharmacist, or other health care provider.  .If you think there has been an overdose, call your poison control center or get medical care right away. Be ready to tell or show what was taken, how much, and when it happened. Teriparatide injection What is this medicine? TERIPARATIDE (terr ih PAR a tyd) increases bone mass and strength. It helps to make healthy bone and to slow bone loss in people with osteoporosis. This medicine helps prevent bone fractures. This medicine may be used for other purposes; ask your health care provider or pharmacist if you have questions. COMMON BRAND NAME(S): Forteo What should I tell my health care provider before I take this medicine? They need to know if you have any of these conditions:  bone disease other than osteoporosis  high levels of calcium in the blood  history of cancer in the bone  kidney stone  Paget's disease  parathyroid disease  receiving radiation therapy  an unusual or allergic reaction to teriparatide, other  medicines, foods, dyes, or preservatives  pregnant or trying to get pregnant  breast-feeding How should I use this medicine? This medicine is for injection under the skin. You will be taught how to prepare and give this medicine. Use exactly as directed. Take your medicine at regular intervals. Do not take your medicine more often than directed. It is important that you put your used needles and pens in a special sharps container. Do not put them in a trash can. If you do not have a sharps container, call your pharmacist or health care provider to get one. A special MedGuide will be given to you by the pharmacist with each prescription and refill. Be sure to read this information carefully each time. Talk to your pediatrician regarding the use of this medicine in children. Special care may be needed. Overdosage: If you think you have taken too much of this medicine contact a poison control center or emergency room at once. NOTE: This medicine is only for you. Do not share this medicine with others. What if I miss a dose? If you miss a dose, take it as soon as you can. If it is almost time for your next dose, take only that dose. Do not take double or extra doses. What may interact with this medicine?  digoxin This list may not describe all possible interactions. Give your health care provider a list of all the medicines, herbs, non-prescription drugs, or dietary supplements you use. Also tell them if you smoke, drink alcohol, or use illegal drugs. Some items may interact with your medicine. What should I watch for while using this medicine? Visit your doctor or health care professional for regular checks on your progress. Your doctor may order blood tests and other tests to see how you are doing. You should make sure you get enough calcium and vitamin D while you are taking this medicine, unless your doctor tells you not to. Discuss the foods you eat and the vitamins you take with your health  care professional. Dennis Bast may get drowsy or dizzy. Do not drive, use machinery, or do anything that needs mental alertness until you know how this medicine affects you. Do not stand or sit up quickly, especially if you are an older patient. This reduces the risk of dizzy or fainting spells. Talk to your doctor about your risk of cancer. You may be more at risk for certain types of cancers if you take this medicine. What side effects may I notice from receiving this medicine? Side effects that you should report to your doctor or health care professional as soon as possible:  allergic reactions like skin rash, itching or hives, swelling of the face, lips, or tongue  blood in the urine; pain in the lower back or side; pain when urinating  signs and symptoms of low blood pressure like dizziness; feeling faint or lightheaded, falls; unusually weak or tired  signs and symptoms of increased calcium like nausea; vomiting; constipation; low energy; or muscle weakness Side effects that usually do not require medical attention (report these to your doctor or health care professional if they continue or are bothersome):  headache  joint pain  nausea  pain, redness, irritation or swelling at the injection site  stomach upset This list may not describe all possible side effects. Call your doctor for medical advice about side effects. You may report side effects to FDA at 1-800-FDA-1088. Where should I keep my medicine? Keep out of the reach of children. Store the pens in a refrigerator between 2 and 8 degrees C (36 and 46 degrees F). Do not freeze. Use the pen quickly after taking out of the refrigerator and recap and return to refrigerator right after using. Protect from light. Throw away any unused medicine 28 days after the first injection from the pen. Throw away any unopened, unused medicine after the expiration date on the label. NOTE: This sheet is a summary. It may not cover all possible  information. If you have questions about this medicine, talk to your doctor, pharmacist, or health care provider.  2020 [employer] Standard (2018-05-03 16:56:19)

## 2019-06-24 LAB — BASIC METABOLIC PANEL WITH GFR
BUN/Creatinine Ratio: 27 (calc) — ABNORMAL HIGH (ref 6–22)
BUN: 32 mg/dL — ABNORMAL HIGH (ref 7–25)
CO2: 26 mmol/L (ref 20–32)
Calcium: 10.1 mg/dL (ref 8.6–10.4)
Chloride: 102 mmol/L (ref 98–110)
Creat: 1.17 mg/dL — ABNORMAL HIGH (ref 0.60–0.93)
GFR, Est African American: 54 mL/min/{1.73_m2} — ABNORMAL LOW (ref >=60)
GFR, Est Non African American: 47 mL/min/{1.73_m2} — ABNORMAL LOW (ref >=60)
Glucose, Bld: 91 mg/dL (ref 65–99)
Potassium: 4.8 mmol/L (ref 3.5–5.3)
Sodium: 140 mmol/L (ref 135–146)

## 2019-06-28 ENCOUNTER — Telehealth: Payer: Self-pay | Admitting: Internal Medicine

## 2019-06-28 LAB — N-TELOPEPTIDE, SERUM: NTX Telopeptide: 6.3 nmol BCE/L (ref 6.2–19.0)

## 2019-06-28 LAB — PARATHYROID HORMONE, INTACT (NO CA): PTH: 16 pg/mL (ref 15–65)

## 2019-06-29 NOTE — Telephone Encounter (Signed)
NA

## 2019-06-30 ENCOUNTER — Ambulatory Visit (INDEPENDENT_AMBULATORY_CARE_PROVIDER_SITE_OTHER): Payer: Medicare Other | Admitting: Internal Medicine

## 2019-06-30 ENCOUNTER — Encounter: Payer: Self-pay | Admitting: Internal Medicine

## 2019-06-30 ENCOUNTER — Other Ambulatory Visit: Payer: Self-pay

## 2019-06-30 ENCOUNTER — Ambulatory Visit: Payer: Medicare Other | Admitting: Neurology

## 2019-06-30 DIAGNOSIS — E559 Vitamin D deficiency, unspecified: Secondary | ICD-10-CM

## 2019-06-30 DIAGNOSIS — M810 Age-related osteoporosis without current pathological fracture: Secondary | ICD-10-CM

## 2019-06-30 NOTE — Progress Notes (Addendum)
Patient ID: [name], female   DOB: October 23, 1945, 73 y.o.   MRN: [id number]   Patient location: Home My location: Office Persons participating in the virtual visit: patient, provider  I connected with the patient on 06/30/19 at 8:58 AM EST by telephone and verified that I am speaking with the correct person.   I discussed the limitations of evaluation and management by telephone and the availability of in person appointments. The patient expressed understanding and agreed to proceed.   Details of the encounter are shown below.  HPI  [name] is a 73 y.o.-year-old female, initially referred by her PCP, Dr. Leighton Ruff, returning for follow-up for osteoporosis (OP).  I previously saw the patient - in 08/2015 for an elevated free T4.  Last visit for osteoporosis was a week ago.  Pt was dx with OP in 2016.  Reviewed DEXA scan reports: Date L1-L3 T score FN T score 33% distal Radius  05/19/2019 ([facility])  -2.2 RFN: -2.8 LFN: -2.7 n/a  05/03/2017 ([facility])  -2.2 (-2.4 for comparison with 2020) RFN: -2.6 LFN: -2.9 n/a  01/17/2015 ([facility])  -1.8 RFN: -2.4 LFN: -2.6 n/a  2014  -1.8 RFN: -2.3 LFN: -2.4 n/a   She has a history of fractures of L tibia-fibula when stepping off a treadmill 02/2017 (had a rod placed) and also nasal bones at the same time.  She has occasional falls-treat pain.  She denies dizziness/vertigo/orthostasis/poor vision.   Previous OP treatments:  - Fosamax 70 mg weekly off and on for 20 years  At last visit, we checked her labs and her kidney function was still low (GFR 47), calcium and PTH levels were normal, vitamin D was normal, phosphorus slightly high, N-telopeptide suppressed: Component     Latest Ref Rng & Units 06/23/2019  Glucose     65 - 99 mg/dL 91  BUN     7 - 25 mg/dL 32 (H)  Creatinine     0.60 - 0.93 mg/dL 1.17 (H)  GFR, Est Non African American     > OR = 60 mL/min/1.83m2 47 (L)  Calcium     8.6 - 10.4 mg/dL 10.1  VITD     30.00 -  100.00 ng/mL 53.73  PTH, Intact     15 - 65 pg/mL 16  Phosphorus     2.3 - 4.6 mg/dL 4.7 (H)  NTX Telopeptide     6.2 - 19.0 nmol BCE/L 6.3   Since her N-telopeptide was suppressed, I did not feel that she would necessarily benefit from Prolia and I suggested Forteo or Tymlos for 2 years.  Patient scheduled this appointment to discuss about these options.  She has a history of vitamin D deficiency, but most recent levels were normal: 02/16/2019: Vitamin D 41.74 04/15/2018: Vitamin D 31.4 Lab Results  Component Value Date   VD25OH 53.73 06/23/2019   VD25OH 41.72 08/19/2015   She is on vitamin D 1000 units daily  No weight bearing exercises, but walks 4-5 times a week 2 miles a day with her husband.  She was getting steroid injections every 6 months in spine (DDD, spinal stenosis)-for years and stopped in 2017.  She does not take high vitamin A doses.  Menopause was at 73 years old.  She did not have HRT.  No FH of osteoporosis.  No hyper or hypocalcemia or hyperparathyroidism.  No history of kidney stones. 11/11/2018: Calcium 9.4 (8.6-10.3) Lab Results  Component Value Date   PTH 16 06/23/2019  CALCIUM 10.1 06/23/2019   CALCIUM 9.3 07/27/2017   CALCIUM 9.9 06/01/2017   CALCIUM 8.9 03/08/2017   CALCIUM 10.0 02/24/2017   CALCIUM 9.6 12/01/2016   CALCIUM 9.8 10/26/2016   CALCIUM 9.4 01/10/2013   CALCIUM 9.2 08/23/2012   CALCIUM 9.3 07/22/2011   TFTs have been normal:  Lab Results  Component Value Date   TSH 0.67 08/19/2015   TSH 0.94 08/23/2012   TSH 0.56 08/03/2011   TSH 0.67 04/24/2010   TSH 0.57 08/08/2009   She has CKD stage III. Last BUN/Cr: 11/11/2018: 38/1.13, GFR 47 Lab Results  Component Value Date   BUN 32 (H) 06/23/2019   CREATININE 1.17 (H) 06/23/2019   Has a h/o HL - could not tolerate stating >> significant mm pain.  On Repatha now.  ROS: Constitutional: no weight gain/no weight loss, no fatigue, + subjective hyperthermia, + subjective  hypothermia Eyes: no blurry vision, no xerophthalmia ENT: no sore throat, no nodules palpated in neck, no dysphagia, no odynophagia, no hoarseness Cardiovascular: no CP/no SOB/no palpitations/no leg swelling Respiratory: no cough/no SOB/no wheezing Gastrointestinal: no N/no V/no D/+ C/no acid reflux Musculoskeletal: no muscle aches/no joint aches Skin: no rashes, + hair loss Neurological:  tremors/no numbness/no tingling/no dizziness  I reviewed pt's medications, allergies, PMH, social hx, family hx, and changes were documented in the history of present illness. Otherwise, unchanged from my initial visit note.  Past Medical History:  Diagnosis Date  . Anxiety   . Arthritis    "back, neck" (03/08/2017)  . Asthma in child   . Bowel obstruction ([place])   . CAD (coronary artery disease), native coronary artery   . Chronic lower back pain   . Chronic neck pain   . Closed left tibial fracture   . Colon polyps   . Complication of anesthesia    "I was awake during one of my surgeries" reports that she felt them start the incision and heard them talking reports happened ~ 35 years ago    . Depression   . Diverticulosis   . Hyperlipidemia   . Irritable bowel syndrome   . Migraine    "used to be severe; now maybe 1/year or 2" (03/08/2017)  . Pneumonia    H!N!   Past Surgical History:  Procedure Laterality Date  . ANKLE HARDWARE REMOVAL Left 07/27/2017   Hardware removal left ankle, takedown non-union with allograft bone graft, Intramedullary Nail (Left)  . APPENDECTOMY    . AUGMENTATION MAMMAPLASTY Bilateral   . BRACHIOPLASTY Bilateral    Arm Lift   . CARDIAC CATHETERIZATION    . CHOLECYSTECTOMY OPEN    . COLON SURGERY    . EXTERNAL FIXATION LEG Left 03/08/2017   Procedure: INTERNAL FIXATION LEFT ANKLE;  Surgeon: Newt Minion, MD;  Location: [facility];  Service: Orthopedics;  Laterality: Left;  . EYE MUSCLE SURGERY Bilateral X 2   "lazy eye; OR twice on each eye; Dr. Annamaria Boots"  .  FRACTURE SURGERY    . HARDWARE REMOVAL Left 07/27/2017   Procedure: HARDWARE REMOVAL;  Surgeon: Mcarthur Rossetti, MD;  Location: [facility];  Service: Orthopedics;  Laterality: Left;  . LEFT HEART CATH AND CORONARY ANGIOGRAPHY N/A 10/27/2016   Procedure: Left Heart Cath and Coronary Angiography;  Surgeon: Peter M Martinique, MD;  Location: [facility];  Service: Cardiovascular;  Laterality: N/A;  . LEFT HEART CATHETERIZATION WITH CORONARY ANGIOGRAM N/A 08/16/2014   Procedure: LEFT HEART CATHETERIZATION WITH CORONARY ANGIOGRAM;  Surgeon: Sinclair Grooms, MD;  Location: [facility] C[facility];  Service: Cardiovascular;  Laterality: N/A;  . NASAL SINUS SURGERY    . ORIF ANKLE FRACTURE Left 03/08/2017  . ORIF ANKLE FRACTURE Left 07/27/2017   Procedure: Hardware removal left ankle, takedown non-union with allograft bone graft, Intramedullary Nail;  Surgeon: Mcarthur Rossetti, MD;  Location: [facility];  Service: Orthopedics;  Laterality: Left;  . PARTIAL COLECTOMY  1970s  . TONSILLECTOMY     Social History   Socioeconomic History  . Marital status: Married    Spouse name: Not on file  . Number of children: 2  . Years of education: Not on file  . Highest education level: Bachelor's degree (e.g., BA, AB, BS)  Occupational History  . Occupation: Retired Merchant navy officer)     Employer: RETIRED  Tobacco Use  . Smoking status: Former Smoker    Packs/day: 1.00    Years: 41.00    Pack years: 41.00    Types: Cigarettes    Quit date: 01/10/2010    Years since quitting: 9.4  . Smokeless tobacco: Never Used  Substance and Sexual Activity  . Alcohol use: No  . Drug use: No  . Sexual activity: Not Currently    Partners: Female  Other Topics Concern  . 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   Marriage in good health.       Physician roster   Gyn- Helene Shoe, NP   Opthal- Dr Annamaria Boots (pediatric opthal)   GI- dr Adriana Simas- Dr Gladstone Lighter   ENT- Dr Ernesto Rutherford            Social Determinants of Health   Financial Resource Strain:   . Difficulty of Paying Living Expenses: Not on file  Food Insecurity:   . Worried About Charity fundraiser in the Last Year: Not on file  . Ran Out of Food in the Last Year: Not on file  Transportation Needs:   . Lack of Transportation (Medical): Not on file  . Lack of Transportation (Non-Medical): Not on file  Physical Activity:   . Days of Exercise per Week: Not on file  . Minutes of Exercise per Session: Not on file  Stress:   . Feeling of Stress : Not on file  Social Connections:   . Frequency of Communication with Friends and Family: Not on file  . Frequency of Social Gatherings with Friends and Family: Not on file  . Attends Religious Services: Not on file  . Active Member of Clubs or Organizations: Not on file  . Attends Archivist Meetings: Not on file  . Marital Status: Not on file  Intimate Partner Violence:   . Fear of Current or Ex-Partner: Not on file  . Emotionally Abused: Not on file  . Physically Abused: Not on file  . Sexually Abused: Not on file   Current Outpatient Medications on File Prior to Visit  Medication Sig Dispense Refill  . ALPRAZolam (XANAX) 0.5 MG tablet Take 0.5 mg by mouth 2 (two) times daily.     . cholecalciferol (VITAMIN D) 1000 units tablet Take 1,000 Units by mouth daily.    . Evolocumab (REPATHA SURECLICK) XX123456 MG/ML SOAJ Inject 1 pen into the skin every 14 (fourteen) days. 2 pen 11  . FLUoxetine (PROZAC) 40 MG capsule TAKE  ONE CAPSULE BY MOUTH ONCE DAILY 30 capsule 0  . propranolol (INDERAL) 10 MG tablet Take 1 tablet by mouth twice daily 180 tablet 0  . traZODone (DESYREL) 50 MG tablet TAKE 1 TABLET BY MOUTH AT BEDTIME (QUALITEST BRAND ONLY [phone]) 30 tablet 5   No current facility-administered medications  on file prior to visit.   Allergies  Allergen Reactions  . Shellfish-Derived Products Anaphylaxis, Shortness Of Breath and Swelling    Throat swells; Iodine is ok  . Lipitor [Atorvastatin] Nausea Only and Other (See Comments)    Fatigue and Lethargy. Leg, back,Joint, and muscle pain. Per patient "My stool was a muddy clay color and I having diarrhea" pt states "I never want to take another statin again"  . Crestor [Rosuvastatin Calcium]     UNSPECIFIED REACTION   . Pravastatin     fatigue  . Zetia [Ezetimibe]     fatigue  . Codeine Nausea And Vomiting   Family History  Problem Relation Age of Onset  . Heart disease Mother        CHF  . Coronary artery disease Mother   . Hypertension Mother   . Hyperlipidemia Mother   . Heart disease Father        CHF  . Coronary artery disease Father   . Diabetes Sister        with all the complications  . Coronary artery disease Sister   . Stroke Sister 26       CVA  . Peripheral vascular disease Sister   . Colon cancer Maternal Grandfather   . Healthy Son   . Liver disease Son        liver failure   . Cancer Neg Hx        breast or colon    PE: There were no vitals taken for this visit. Wt Readings from Last 3 Encounters:  06/23/19 131 lb (59.4 kg)  05/30/19 130 lb (59 kg)  04/14/19 133 lb 1.9 oz (60.4 kg)   Constitutional:  in NAD  The physical exam was not performed (telephone visit). Assessment: 1. Osteoporosis  2.  Vitamin D deficiency  Plan: 1. Osteoporosis -Likely postmenopausal/age-related (she has a history of early menopause without HRT), also, possibly steroid induced (she received spinal injections with steroids for many years and if she stopped after our last visit in 2017). -At last visit, and again today, we discussed about increasing risk of fracture, depending on T score, greatly increased when the T score is lower than -2.5.  Her lower score on the latest DXA scan was -2.8. -She has a history of fragility  fracture in 2018 and this was slow to heal -At last visit we discussed about the recommended dietary and supplemental calcium and vitamin D intake.  Her vitamin D level was normal.  She is taking 1000 units daily. -She was not exercising at last visit other than walking and I advised her to increase this and given her the National osteoporosis foundation recommendations for weightbearing exercises.  We also discussed fall precautions. -At this visit, we again reviewed different medication classes for osteoporosis, benefits, and side effects.  She has been on Fosamax for many years and based on her recent results, Prolia would not benefit her too much since she had an N-telopeptide, therefore, her bone destruction is not high.  I suggested an anabolic agent which teriparatide/abaloparatide which are daily subcutaneous medications taken for 2 years.  She does not have a history of hypercalcemia or hyperparathyroidism.  She also does not have a history of radiation therapy to the skeleton for cancer.  We did discuss about the risk of headaches and constipation with his agents.  We also discussed about taking the midnight, rather than in the morning due to possible dizziness.  Osteosarcoma is an extremely rare side effect. -For now, I asked her to perform a 24-hour urine collection and discussed about how to do this.  After I received the results, we will start the preauthorization for Forteo or Tymlos, which ever covered by her insurance. - will check a new DXA scan in 2 years after starting medication-  the first indication that the treatment is working is her not having anymore fractures. DXA scan changes are secondary: Unchanged or higher T-scores are desirable. - I we will see her back in 3 months  2.  Vitamin D deficiency -Latest vitamin D level was normal few days ago -Continue current vitamin D supplementation with 1000 units daily.  - time spent with the patient: 12 min, of which >50% was spent in  obtaining information about her symptoms, reviewing her previous labs, evaluations, and treatments, counseling her about her conditions (please see the discussed topics above), and developing a plan to further investigate and treat them; she had a number of questions which I addressed.  Urine calcium level is normal: Component     Latest Ref Rng & Units 07/03/2019  Creatinine, 24H Ur     0.50 - 2.15 g/24 h 0.78  Calcium, 24H Urine     mg/24 h 81   We will continue with the plan for Forteo/Tymlos, which ever approved by her insurance.  Philemon Kingdom, MD PhD [facility] Endocrinology

## 2019-07-03 ENCOUNTER — Other Ambulatory Visit: Payer: Self-pay

## 2019-07-03 ENCOUNTER — Other Ambulatory Visit: Payer: Medicare Other

## 2019-07-03 DIAGNOSIS — M810 Age-related osteoporosis without current pathological fracture: Secondary | ICD-10-CM

## 2019-07-04 ENCOUNTER — Encounter: Payer: Self-pay | Admitting: Internal Medicine

## 2019-07-04 LAB — CREATININE, URINE, 24 HOUR: Creatinine, 24H Ur: 0.78 g/(24.h) (ref 0.50–2.15)

## 2019-07-04 LAB — CALCIUM, URINE, 24 HOUR: Calcium, 24H Urine: 81 mg/24 h

## 2019-07-10 ENCOUNTER — Other Ambulatory Visit: Payer: Self-pay | Admitting: Internal Medicine

## 2019-07-10 MED ORDER — TYMLOS 3120 MCG/1.56ML ~~LOC~~ SOPN: 80.0000 ug | PEN_INJECTOR | Freq: Every day | SUBCUTANEOUS | 11 refills | Status: DC

## 2019-07-11 NOTE — Telephone Encounter (Signed)
Patient called to advise that cost of the medication is over $2200 and not something that is within her reach financially.  Would like information about getting the discount/coupon card? Other options?

## 2019-07-12 ENCOUNTER — Telehealth: Payer: Self-pay

## 2019-07-12 NOTE — Telephone Encounter (Signed)
Prior authorization for Tymlos injection has been approved by patient's insurance.  Coverage is effective now through 07/12/2020  PA Approval/Reference Number [id number]  Approval letter has been sent to scanning.

## 2019-07-13 ENCOUNTER — Other Ambulatory Visit: Payer: Self-pay

## 2019-07-13 ENCOUNTER — Ambulatory Visit (INDEPENDENT_AMBULATORY_CARE_PROVIDER_SITE_OTHER): Payer: Medicare Other | Admitting: Neurology

## 2019-07-13 DIAGNOSIS — G245 Blepharospasm: Secondary | ICD-10-CM | POA: Diagnosis not present

## 2019-07-13 MED ORDER — ONABOTULINUMTOXINA 100 UNITS IJ SOLR
25.0000 [IU] | Freq: Once | INTRAMUSCULAR | Status: AC
  Administered 2019-07-13: 25 [IU] via INTRAMUSCULAR

## 2019-07-13 NOTE — Procedures (Signed)
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  Risk discussed included, but were not limited pain and discomfort, bleeding, bruising, excessive weakness, venous thrombosis, muscle atrophy and dysphagia.  A copy of the patient medication guide was given to the patient which explains the blackbox warning.  Patients identity and treatment sites confirmed yes.  Details of Procedure: Skin was cleaned with alcohol.   Prior to injection, the needle plunger was aspirated to make sure the needle was not within a blood vessel.  There was no blood retrieved on aspiration.    Following is a summary of the muscles injected  And the amount of Botulinum toxin used:  Injections  Location Left  Right Units Number of sites        Corrugator 2.5 2.5 2.5   Procerus 2.5 2.5 5.0   Lower Lid, Lateral       Lower Lid Medial  2.5    2.5    Upper Lid, Lateral 2.5   2.5   Upper Lid, Medial      Canthus 2.5 2.5 5.0   Nasalis      [place]      Temporalis 2.5  2.5   Zygomaticus Major 2.5  2.5   TOTAL UNITS:   22.5    Agent: Botulinum Type A ( Onobotulinum Toxin type A ).  1 vials of Botox were used, each containing 50 units and freshly diluted with 2 mL of sterile, non-perserved saline   Total injected (Units): 25.0  Total wasted (Units): 0 Pt tolerated procedure well without complications.   

## 2019-07-17 ENCOUNTER — Telehealth: Payer: Self-pay | Admitting: Internal Medicine

## 2019-07-17 NOTE — Telephone Encounter (Signed)
Patient called re: Patient has new Insurance-[employer] - ID# [id number] and patient would like to pick up the coupon Dr. Cruzita Lederer discussed with patient for the new medication that was discussed by Dr. Cruzita Lederer.

## 2019-07-18 ENCOUNTER — Telehealth: Payer: Self-pay

## 2019-07-18 NOTE — Telephone Encounter (Signed)
-----   Message from Philemon Kingdom, MD sent at 07/17/2019  1:08 PM EST ----- M, Per previous message, this patient's Tymlos PA was approved, but she cannot afford the co-pay, which is $100 a month.  Can we try to submit the preauthorization for Forteo?  Maybe this is cheaper. Thank you, C

## 2019-07-20 NOTE — Telephone Encounter (Signed)
Received updated fax from [employer], it states:  Prescription Status:  This plan terminated on 07/13/2019. If you have any updated information, please re-submit for additional research.  Coverage Status:     No Coverage Options Coverage Outcome: Not Covered Coverage Outcome Reason: No Drug Coverage

## 2019-07-20 NOTE — Telephone Encounter (Signed)
Paperwork for [name] has been submitted.  Received fax from [employer]. They have received the enrollment request for patient, Program ID number [id number] They will be contacting us to provide updates.

## 2019-07-20 NOTE — Telephone Encounter (Signed)
Can you please let her know?  Did she change her insurance? C

## 2019-07-21 ENCOUNTER — Ambulatory Visit: Payer: Medicare Other | Admitting: Neurology

## 2019-07-24 NOTE — Telephone Encounter (Signed)
I have asked [name] to assist with this.

## 2019-07-25 ENCOUNTER — Telehealth: Payer: Self-pay | Admitting: Cardiovascular Disease

## 2019-07-25 MED ORDER — REPATHA SURECLICK 140 MG/ML ~~LOC~~ SOAJ: 1.0000 "pen " | SUBCUTANEOUS | 3 refills | Status: DC

## 2019-07-25 NOTE — Addendum Note (Signed)
Addended by: Allean Found on: 07/25/2019 04:06 PM   Modules accepted: Orders

## 2019-07-25 NOTE — Telephone Encounter (Signed)
Called the pt to retrieve the up to date insurance information is as follows: [id number] [id number] NEEDS A 3 MONTH SUPPLY AT A TIME. Told the pt that I would call as soon as I get an approval. Pt voice understanding.

## 2019-07-25 NOTE — Telephone Encounter (Signed)
Patient calling stating her insurance has changed. She also states that with her new insurance her Repatha is cheaper with 90 day supply, so she would like to have a 90 day supply instead of a 30 day.

## 2019-07-25 NOTE — Telephone Encounter (Signed)
Called and let the pt know that they are approved and that the refill was sent for a 90 day supply so that its cheaper for the pt and the pt voiced understanding

## 2019-07-28 ENCOUNTER — Telehealth: Payer: Self-pay

## 2019-07-28 NOTE — Telephone Encounter (Signed)
Prior authorization for [name] has been approved by patient's insurance.  Coverage is effective until 07/12/2020  Approval letter has been sent to scanning.

## 2019-08-01 NOTE — Telephone Encounter (Signed)
You need to call Forteo connect to see if the script has been sent to them. If not, you ask them what the next step is.  Telephone number:

## 2019-08-03 ENCOUNTER — Encounter: Payer: Self-pay | Admitting: Internal Medicine

## 2019-08-16 NOTE — Telephone Encounter (Signed)
Patient requests to be called at ph# [phone] re: Patient has not heard back from our office about PA for Prolia. Patient has to have PA in place before she can have the injection.

## 2019-08-18 NOTE — Telephone Encounter (Signed)
Patient has declined due to co-pay cost.

## 2019-08-22 ENCOUNTER — Telehealth: Payer: Self-pay | Admitting: Internal Medicine

## 2019-08-22 NOTE — Telephone Encounter (Signed)
Pt has been entered into the portal just awaiting SOB

## 2019-08-22 NOTE — Telephone Encounter (Signed)
-----   Message from Nile Riggs, [place] sent at 08/22/2019  8:24 AM EST ----- I am unable to check status of this patients Prolia at the front desk.  Lattie Haw ----- Message ----- From: Philemon Kingdom, MD Sent: 08/04/2019   5:46 PM EST To: Nile Riggs, CMA  Lattie Haw, Can you please submit this patient to the portal for Prolia? Ty, C

## 2019-08-23 ENCOUNTER — Telehealth: Payer: Self-pay | Admitting: Cardiovascular Disease

## 2019-08-23 MED ORDER — REPATHA SURECLICK 140 MG/ML ~~LOC~~ SOAJ: 1.0000 "pen " | SUBCUTANEOUS | 3 refills | Status: DC

## 2019-08-23 NOTE — Telephone Encounter (Signed)
She can try OTC hydrocortisone cream/benadryl (caution can cause drowsiness) or loratidine. But should probably also have it evaluated by her PCP or dermatologist if she has one. She should stop taking Repatha until the cause of the rash is determined.

## 2019-08-23 NOTE — Telephone Encounter (Signed)
New message  Pt c/o medication issue:  1. Name of Medication: Evolocumab (REPATHA SURECLICK) XX123456 MG/ML SOAJ  2. How are you currently taking this medication (dosage and times per day)? Inject 1 pen into the skin every 14 (fourteen) days.  3. Are you having a reaction (difficulty breathing--STAT)? Yes, raised red rash on legs and arms, back pain  4. What is your medication issue? Patient has an itchy raised red rash on her legs and arms. States that she has had the rash for about 3 weeks, after 5th injection. Patient also describes having back pain that makes it hard to get up in the morning. Please give patient a call back to advise.

## 2019-08-23 NOTE — Telephone Encounter (Signed)
Pt advised Melissa's recommendation and will call Dr. Drema Dallas her PCP this morning. Will hold Repatha for now and let us know when her rash clears up for a future plan with the injections.

## 2019-08-23 NOTE — Telephone Encounter (Signed)
Pt called to report that she has had a raised, raw looking rash on her legs and arms bilaterally.... she has had it and has been worsening over 3 weeks. It is not oozing but looks wet and is itchy but not very painful. She has not noticed a break out on her back but she has had tightness in her lower back when she lays on it at night. The rash is on both sides of her body and even though it is similar to her shingles in the past she does not feel it is the same and not as painful as what she has experienced with shingles before.   She has not tried topical or oral antihistamines but initially tried Cerave cream thinking it was dry skin but it did not make any improvement. Pt says she was on Repatha 6 weeks prior to this rash but feels she is having some sort of allergic reaction to something and could only think it could be the [place].   Pt says she has oddly had a mild sore throat and congestion that she felt was going to turn into a "cold" but has not but the mild symptoms have been present since she developed the rash.   Will forward to the Lipid clinic [facility] for review and recommendations.

## 2019-08-23 NOTE — Progress Notes (Signed)
Pt has been entered. Awaiting SOB

## 2019-10-03 ENCOUNTER — Telehealth: Payer: Self-pay | Admitting: Cardiovascular Disease

## 2019-10-03 ENCOUNTER — Telehealth: Payer: Self-pay | Admitting: Pharmacist

## 2019-10-03 NOTE — Telephone Encounter (Signed)
Pt called clinic. States she gave a Repatha shot the day after her first COVID shot and noted itching and hives. She went to a dermatologist who did not think Repatha was causing her symptoms since her rash was on her arms and not her trunk. They gave her a cream which helped resolve her symptoms. It has now been 2 weeks since her second COVID shot, last Repatha injection was 2/8. She has a 3 month supply of Repatha still at home.  Advised pt to resume Repatha but to give [place] a call if her itching or hives return. If they do, we could try her on Praluent to see if she tolerates that better. She is previously intolerant to multiple statins (myaligas).

## 2019-10-03 NOTE — Telephone Encounter (Signed)
Connected call to [facility] with Pharmacy

## 2019-10-04 ENCOUNTER — Encounter: Payer: Self-pay | Admitting: *Deleted

## 2019-10-04 NOTE — Progress Notes (Addendum)
[place] Key: [id number] - PA Case ID: [id number] Need help? Call us at [phone] Outcome   Approvedon March 24 PA Case: [id number], Status: Approved, Coverage Starts on: 10/04/2019 12:00:00 AM, Coverage Ends on: 07/12/2020 12:00:00 AM.  Questions? Contact [phone]. Drug  Botox 100UNIT solution Form  Nurse, adult and Medical Benefit PA Form  Fax sent to scan into her chart

## 2019-10-12 ENCOUNTER — Other Ambulatory Visit: Payer: Self-pay

## 2019-10-12 ENCOUNTER — Ambulatory Visit (INDEPENDENT_AMBULATORY_CARE_PROVIDER_SITE_OTHER): Payer: Medicare PPO | Admitting: Neurology

## 2019-10-12 DIAGNOSIS — G245 Blepharospasm: Secondary | ICD-10-CM | POA: Diagnosis not present

## 2019-10-12 MED ORDER — ONABOTULINUMTOXINA 100 UNITS IJ SOLR
100.0000 [IU] | Freq: Once | INTRAMUSCULAR | Status: AC
  Administered 2019-10-12: 25 [IU] via INTRAMUSCULAR

## 2019-10-12 NOTE — Procedures (Signed)
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  Risk discussed included, but were not limited pain and discomfort, bleeding, bruising, excessive weakness, venous thrombosis, muscle atrophy and dysphagia.  A copy of the patient medication guide was given to the patient which explains the blackbox warning.  Patients identity and treatment sites confirmed yes.  Details of Procedure: Skin was cleaned with alcohol.   Prior to injection, the needle plunger was aspirated to make sure the needle was not within a blood vessel.  There was no blood retrieved on aspiration.    Following is a summary of the muscles injected  And the amount of Botulinum toxin used:  Injections  Location Left  Right Units Number of sites        Corrugator 2.5 2.5 2.5   Procerus 2.5 2.5 5.0   Lower Lid, Lateral       Lower Lid Medial  2.5    2.5    Upper Lid, Lateral 2.5   2.5   Upper Lid, Medial      Canthus 2.5 2.5 5.0   Nasalis      [place]      Temporalis 2.5  2.5   Zygomaticus Major 2.5  2.5   TOTAL UNITS:   22.5    Agent: Botulinum Type A ( Onobotulinum Toxin type A ).  1 vials of Botox were used, each containing 50 units and freshly diluted with 2 mL of sterile, non-perserved saline   Total injected (Units): 25.0  Total wasted (Units): 0 Pt tolerated procedure well without complications.   

## 2019-10-24 DIAGNOSIS — M810 Age-related osteoporosis without current pathological fracture: Secondary | ICD-10-CM | POA: Diagnosis not present

## 2019-10-24 DIAGNOSIS — E559 Vitamin D deficiency, unspecified: Secondary | ICD-10-CM | POA: Diagnosis not present

## 2019-10-24 DIAGNOSIS — Z23 Encounter for immunization: Secondary | ICD-10-CM | POA: Diagnosis not present

## 2019-10-24 DIAGNOSIS — F411 Generalized anxiety disorder: Secondary | ICD-10-CM | POA: Diagnosis not present

## 2019-10-24 DIAGNOSIS — I2510 Atherosclerotic heart disease of native coronary artery without angina pectoris: Secondary | ICD-10-CM | POA: Diagnosis not present

## 2019-10-24 DIAGNOSIS — E7800 Pure hypercholesterolemia, unspecified: Secondary | ICD-10-CM | POA: Diagnosis not present

## 2019-10-24 DIAGNOSIS — N183 Chronic kidney disease, stage 3 unspecified: Secondary | ICD-10-CM | POA: Diagnosis not present

## 2019-11-06 ENCOUNTER — Telehealth: Payer: Self-pay

## 2019-11-06 NOTE — Telephone Encounter (Signed)
PA initiated via [email or web address] for Prolia 60mg /mL injection.  [name] Key: [id number] - PA Case ID: [id number] Need help? Call us at [phone] Status Sent to [place] 60MG /ML syringes Form Nurse, adult and Medical Benefit PA Form

## 2019-11-07 NOTE — Telephone Encounter (Signed)
Called pt and left voicemail informing her that she has been approved for Prolia and she can call back to schedule injection anytime.

## 2019-11-07 NOTE — Telephone Encounter (Signed)
Received notification via [email or web address] for patient stating that the PA for Prolia has been approved.   Auburn Bilberry  Key: [id number]   PA Case ID: [id number] Need help? Call us at [phone] Outcome: Approvedon April 26 PA Case: [id number],  Status: Approved,  Coverage Starts on: 11/06/2019 12:00:00 AM,  Coverage Ends on: 07/12/2020 12:00:00 AM. Q

## 2019-11-14 ENCOUNTER — Other Ambulatory Visit: Payer: Self-pay

## 2019-11-14 ENCOUNTER — Ambulatory Visit (INDEPENDENT_AMBULATORY_CARE_PROVIDER_SITE_OTHER): Payer: Medicare PPO

## 2019-11-14 DIAGNOSIS — M810 Age-related osteoporosis without current pathological fracture: Secondary | ICD-10-CM | POA: Diagnosis not present

## 2019-11-14 MED ORDER — DENOSUMAB 60 MG/ML ~~LOC~~ SOSY
60.0000 mg | PREFILLED_SYRINGE | Freq: Once | SUBCUTANEOUS | Status: AC
  Administered 2019-11-14: 60 mg via SUBCUTANEOUS

## 2019-11-14 NOTE — Progress Notes (Signed)
Per orders of Dr. Cruzita Lederer injection of Prolia 60mg  given today by N.Nhi Butrum,LPN. Patient tolerated injection well. This is pt's first Prolia injection and she is being kept for an additional 15 minutes after injection for observation.  Assessed vital signs to establish baseline.  BP was 110/70, pulse was 86 and O2 was 97% room air.  At 10:41am, pt left office and stated that she felt fine. No obvious signs or symptoms of adverse effects or reactions noted at this time.

## 2019-12-01 DIAGNOSIS — E559 Vitamin D deficiency, unspecified: Secondary | ICD-10-CM | POA: Diagnosis not present

## 2019-12-01 DIAGNOSIS — E785 Hyperlipidemia, unspecified: Secondary | ICD-10-CM | POA: Diagnosis not present

## 2020-01-11 ENCOUNTER — Ambulatory Visit: Payer: Medicare PPO | Admitting: Neurology

## 2020-01-25 DIAGNOSIS — E559 Vitamin D deficiency, unspecified: Secondary | ICD-10-CM | POA: Diagnosis not present

## 2020-01-25 DIAGNOSIS — E785 Hyperlipidemia, unspecified: Secondary | ICD-10-CM | POA: Diagnosis not present

## 2020-02-01 ENCOUNTER — Telehealth: Payer: Self-pay | Admitting: Cardiovascular Disease

## 2020-02-01 NOTE — Telephone Encounter (Signed)
Patient AST and ALT are normal on her last labs. LDL has improved to 89. Repatha does not affect kidney function However it is possible that it could be causing rash or diarrhea

## 2020-02-01 NOTE — Telephone Encounter (Signed)
Pt c/o medication issue:  1. Name of Medication: Evolocumab (REPATHA SURECLICK) 301 MG/ML SOAJ  2. How are you currently taking this medication (dosage and times per day)? 1 pen into the skin every 14 (fourteen) days  3. Are you having a reaction (difficulty breathing--STAT)? Yes  4. What is your medication issue? Patient states the medication is causing elevated liver and kidney scores, increased cholesterol, diarrhea, and a rash on her legs and arms. She states she would like to speak with a pharmacist.

## 2020-02-01 NOTE — Telephone Encounter (Signed)
Patient states she would prefer a call back after 1:00 PM.

## 2020-02-01 NOTE — Telephone Encounter (Signed)
I spoke with patient and scheduled her for an appointment in the lipid clinic on July 26,2021 at 3:30

## 2020-02-01 NOTE — Telephone Encounter (Signed)
I spoke with patient and gave her information from pharmacist. Patient reports she stopped Repatha for 3 months and since resuming Repatha her rash has returned. Is being treated with a steroid cream but rash is still raised and visible.  She states glucose is also elevated and testing has been ordered by PCP to rule out diabetes. Patient continues to have pain in back.  Has seen kidney doctor and it is not kidney related.  Is going to see a back doctor. Patient would like to have appointment with pharmacist to discuss her symptoms and Repatha.

## 2020-02-05 ENCOUNTER — Ambulatory Visit: Payer: Medicare PPO

## 2020-02-05 NOTE — Progress Notes (Deleted)
Patient ID: MUNIRA POLSON                 DOB: April 26, 1946                    MRN: [id number]     HPI: DEBROAH SHUTTLEWORTH is a 74 y.o. female patient referred to lipid clinic by ***. PMH is significant for   Current Medications:  Intolerances:  Risk Factors:  LDL goal:   Diet:   Exercise:   Family History:   Social History:   Labs:  Past Medical History:  Diagnosis Date  . Anxiety   . Arthritis    "back, neck" (03/08/2017)  . Asthma in child   . Bowel obstruction ([place])   . CAD (coronary artery disease), native coronary artery   . Chronic lower back pain   . Chronic neck pain   . Closed left tibial fracture   . Colon polyps   . Complication of anesthesia    "I was awake during one of my surgeries" reports that she felt them start the incision and heard them talking reports happened ~ 35 years ago    . Depression   . Diverticulosis   . Hyperlipidemia   . Irritable bowel syndrome   . Migraine    "used to be severe; now maybe 1/year or 2" (03/08/2017)  . Pneumonia    H!N!    Current Outpatient Medications on File Prior to Visit  Medication Sig Dispense Refill  . Abaloparatide (TYMLOS) 3120 MCG/1.56ML SOPN Inject 80 mcg into the skin daily. 1 pen 11  . ALPRAZolam (XANAX) 0.5 MG tablet Take 0.5 mg by mouth 2 (two) times daily.     . cholecalciferol (VITAMIN D) 1000 units tablet Take 1,000 Units by mouth daily.    . Evolocumab (REPATHA SURECLICK) 572 MG/ML SOAJ Inject 1 pen into the skin every 14 (fourteen) days. ON HOLD DUE TO RASH.Marland Kitchen POTENTIAL DRUG ALLERGY 08/23/19 6 pen 3  . FLUoxetine (PROZAC) 40 MG capsule TAKE ONE CAPSULE BY MOUTH ONCE DAILY 30 capsule 0  . propranolol (INDERAL) 10 MG tablet Take 1 tablet by mouth twice daily 180 tablet 0  . traZODone (DESYREL) 50 MG tablet TAKE 1 TABLET BY MOUTH AT BEDTIME (QUALITEST BRAND ONLY [phone]) 30 tablet 5   No current facility-administered medications on file prior to visit.    Allergies  Allergen Reactions  .  Shellfish-Derived Products Anaphylaxis, Shortness Of Breath and Swelling    Throat swells; Iodine is ok  . Lipitor [Atorvastatin] Nausea Only and Other (See Comments)    Fatigue and Lethargy. Leg, back,Joint, and muscle pain. Per patient "My stool was a muddy clay color and I having diarrhea" pt states "I never want to take another statin again"  . Crestor [Rosuvastatin Calcium]     UNSPECIFIED REACTION   . Pravastatin     fatigue  . Zetia [Ezetimibe]     fatigue  . Codeine Nausea And Vomiting    Assessment/Plan:  1. Hyperlipidemia -

## 2020-02-06 DIAGNOSIS — E878 Other disorders of electrolyte and fluid balance, not elsewhere classified: Secondary | ICD-10-CM | POA: Diagnosis not present

## 2020-02-06 DIAGNOSIS — R7301 Impaired fasting glucose: Secondary | ICD-10-CM | POA: Diagnosis not present

## 2020-02-12 DIAGNOSIS — M4180 Other forms of scoliosis, site unspecified: Secondary | ICD-10-CM | POA: Diagnosis not present

## 2020-02-12 DIAGNOSIS — M419 Scoliosis, unspecified: Secondary | ICD-10-CM | POA: Diagnosis not present

## 2020-02-12 DIAGNOSIS — M47819 Spondylosis without myelopathy or radiculopathy, site unspecified: Secondary | ICD-10-CM | POA: Diagnosis not present

## 2020-02-12 DIAGNOSIS — M479 Spondylosis, unspecified: Secondary | ICD-10-CM | POA: Diagnosis not present

## 2020-02-13 DIAGNOSIS — R7301 Impaired fasting glucose: Secondary | ICD-10-CM | POA: Diagnosis not present

## 2020-02-13 DIAGNOSIS — N183 Chronic kidney disease, stage 3 unspecified: Secondary | ICD-10-CM | POA: Diagnosis not present

## 2020-02-13 DIAGNOSIS — F411 Generalized anxiety disorder: Secondary | ICD-10-CM | POA: Diagnosis not present

## 2020-02-13 DIAGNOSIS — G720 Drug-induced myopathy: Secondary | ICD-10-CM | POA: Diagnosis not present

## 2020-02-13 DIAGNOSIS — E7800 Pure hypercholesterolemia, unspecified: Secondary | ICD-10-CM | POA: Diagnosis not present

## 2020-02-13 DIAGNOSIS — E559 Vitamin D deficiency, unspecified: Secondary | ICD-10-CM | POA: Diagnosis not present

## 2020-02-13 DIAGNOSIS — G250 Essential tremor: Secondary | ICD-10-CM | POA: Diagnosis not present

## 2020-02-15 ENCOUNTER — Other Ambulatory Visit: Payer: Self-pay | Admitting: Family Medicine

## 2020-02-15 DIAGNOSIS — N1832 Chronic kidney disease, stage 3b: Secondary | ICD-10-CM

## 2020-02-22 ENCOUNTER — Ambulatory Visit
Admission: RE | Admit: 2020-02-22 | Discharge: 2020-02-22 | Disposition: A | Discharge status: Home / Self Care | Payer: Medicare PPO | Source: Ambulatory Visit | Attending: Family Medicine | Admitting: Family Medicine

## 2020-02-22 DIAGNOSIS — N281 Cyst of kidney, acquired: Secondary | ICD-10-CM | POA: Diagnosis not present

## 2020-02-22 DIAGNOSIS — N1832 Chronic kidney disease, stage 3b: Secondary | ICD-10-CM

## 2020-02-22 DIAGNOSIS — N183 Chronic kidney disease, stage 3 unspecified: Secondary | ICD-10-CM | POA: Diagnosis not present

## 2020-02-23 ENCOUNTER — Ambulatory Visit: Payer: Medicare PPO | Admitting: Neurology

## 2020-03-04 DIAGNOSIS — F419 Anxiety disorder, unspecified: Secondary | ICD-10-CM | POA: Diagnosis not present

## 2020-03-04 DIAGNOSIS — F329 Major depressive disorder, single episode, unspecified: Secondary | ICD-10-CM | POA: Diagnosis not present

## 2020-03-04 DIAGNOSIS — E559 Vitamin D deficiency, unspecified: Secondary | ICD-10-CM | POA: Diagnosis not present

## 2020-03-04 DIAGNOSIS — K589 Irritable bowel syndrome without diarrhea: Secondary | ICD-10-CM | POA: Diagnosis not present

## 2020-03-04 DIAGNOSIS — R7989 Other specified abnormal findings of blood chemistry: Secondary | ICD-10-CM | POA: Diagnosis not present

## 2020-03-04 DIAGNOSIS — I2510 Atherosclerotic heart disease of native coronary artery without angina pectoris: Secondary | ICD-10-CM | POA: Diagnosis not present

## 2020-03-04 DIAGNOSIS — R799 Abnormal finding of blood chemistry, unspecified: Secondary | ICD-10-CM | POA: Diagnosis not present

## 2020-03-04 DIAGNOSIS — E785 Hyperlipidemia, unspecified: Secondary | ICD-10-CM | POA: Diagnosis not present

## 2020-03-06 ENCOUNTER — Ambulatory Visit (INDEPENDENT_AMBULATORY_CARE_PROVIDER_SITE_OTHER): Payer: Medicare PPO

## 2020-03-06 ENCOUNTER — Ambulatory Visit: Payer: Medicare PPO | Admitting: Orthopaedic Surgery

## 2020-03-06 DIAGNOSIS — R102 Pelvic and perineal pain: Secondary | ICD-10-CM

## 2020-03-06 NOTE — Progress Notes (Signed)
Office Visit Note   Patient: [name]           Date of Birth: 05-13-1946           MRN: [id number] Visit Date: 03/06/2020              Requested by: [name] [place],  [place] [id number] PCP: [name]   Assessment & Plan: Visit Diagnoses:  1. Pain in pelvis     Plan: Patient does have bilateral hip bursitis could benefit from IT band traction and also from possible steroid injections she defers steroid injection therefore recommend IT band stretching.  In regards to both hips she has normal radiographs of both hips and a good range of motion of both hips likelihood of her hips being a source of her pain is low.  Questions were encouraged and answered by [name] myself.  Follow-up with [place] as needed.  Follow-Up Instructions: Return if symptoms worsen or fail to improve.   Orders:  Orders Placed This Encounter  Procedures  . XR HIPS BILAT W OR W/O PELVIS 2V   No orders of the defined types were placed in this encounter.     Procedures: No procedures performed   Clinical Data: No additional findings.   Subjective: Chief Complaint  Patient presents with  . Left Hip - Pain  . Right Hip - Pain    HPI  [name] is referred to our clinic by Dr. Dorian Mcpherson for evaluation of possible hip pathology as a source of her low back pain and lower leg pain.  We last saw her in 2020 and at that time ordered an MRI of her pelvis and lumbar spine however patient called to cancel due to the fact that she felt that her statin medications were causing her pain in her low back and SI joint region.  She comes in today to see if she may have some arthritic changes or other pathology in both hips that are causing her pain.  She has a MRI of her lumbar spine without contrast ordered by [name]. She had no new injury to either hip.  She is states she has pain mostly in the lower lumbar pelvic region posteriorly.  No deep groin pain.  Review  of Systems  Constitutional: Negative for chills and fever.  Respiratory: Negative for shortness of breath.   Musculoskeletal: Positive for arthralgias and back pain.     Objective: Vital Signs: There were no vitals taken for this visit.  Physical Exam Constitutional:      Appearance: She is not ill-appearing or diaphoretic.  Pulmonary:     Effort: Pulmonary effort is normal.  Neurological:     Mental Status: She is alert and oriented to person, place, and time.  Psychiatric:        Mood and Affect: Mood normal.     Ortho Exam Bilateral hips excellent range of motion.  Extreme external rotation of both hips causes some discomfort.  She has tenderness over the trochanteric regions of both hips.  Logroll both hips causes no pain.  She has tenderness over the SI joints bilaterally.  Faber's is negative bilaterally. Specialty Comments:  No specialty comments available.  Imaging: XR HIPS BILAT W OR W/O PELVIS 2V  Result Date: 03/06/2020 AP pelvis and lateral views bilateral hips: No acute fractures.  Both hips well located.  No periarticular osteophytes.  No evidence of AVN.  Hip joints are well-maintained bilaterally.  PMFS History: Patient Active Problem List   Diagnosis Date Noted  . Age-related osteoporosis without current pathological fracture 05/31/2019  . Drug-induced myopathy 05/31/2019  . Essential tremor 05/31/2019  . History of colonic polyps 05/31/2019  . Insomnia, unspecified 05/31/2019  . Stage 3 chronic kidney disease 05/31/2019  . Vitamin D deficiency, unspecified 05/31/2019  . Breast implant in situ 05/31/2019  . 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 Painful orthopaedic hardware ([place]) 12/15/2017  . Posterior tibial tendinitis, left leg 12/15/2017  . 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 Open fracture of tibia and fibula, shaft, left, type I or  II, with nonunion, subsequent encounter 07/21/2017  . Closed displaced fracture of nasal bone with routine healing 04/13/2017  . Closed pilon fracture, left, initial encounter 03/08/2017  . Closed fracture of left fibula and tibia   . Chest pain 10/27/2016  . Abnormal weight gain 07/25/2015  . Ganglion 07/25/2015  . Unstable angina ([place])   . CAD (coronary artery disease), native coronary artery   . Anxiety   . Irritable bowel syndrome   . Depression   . Hyperlipidemia   . History of migraines    Past Medical History:  Diagnosis Date  . Anxiety   . Arthritis    "back, neck" (03/08/2017)  . Asthma in child   . Bowel obstruction ([place])   . CAD (coronary artery disease), native coronary artery   . Chronic lower back pain   . Chronic neck pain   . Closed left tibial fracture   . Colon polyps   . Complication of anesthesia    "I was awake during one of my surgeries" reports that she felt them start the incision and heard them talking reports happened ~ 35 years ago    . Depression   . Diverticulosis   . Hyperlipidemia   . Irritable bowel syndrome   . Migraine    "used to be severe; now maybe 1/year or 2" (03/08/2017)  . Pneumonia    H!N!    Family History  Problem Relation Age of Onset  . Heart disease Mother        CHF  . Coronary artery disease Mother   . Hypertension Mother   . Hyperlipidemia Mother   . Heart disease Father        CHF  . Coronary artery disease Father   . Diabetes Sister        with all the complications  . Coronary artery disease Sister   . Stroke Sister 75       CVA  . Peripheral vascular disease Sister   . Colon cancer Maternal Grandfather   . Healthy Son   . Liver disease Son        liver failure   . Cancer Neg Hx        breast or colon    Past Surgical History:  Procedure Laterality Date  . ANKLE HARDWARE REMOVAL Left 07/27/2017   Hardware removal left ankle, takedown non-union with allograft bone graft, Intramedullary Nail (Left)  .  APPENDECTOMY    . AUGMENTATION MAMMAPLASTY Bilateral   . BRACHIOPLASTY Bilateral    Arm Lift   . CARDIAC CATHETERIZATION    . CHOLECYSTECTOMY OPEN    . COLON SURGERY    . EXTERNAL FIXATION LEG Left 03/08/2017   Procedure: INTERNAL FIXATION LEFT ANKLE;  Surgeon: Newt Minion, Mcpherson;  Location:  [facility];  Service: Orthopedics;  Laterality: Left;  . EYE MUSCLE SURGERY Bilateral X 2   "lazy eye; OR twice on each eye; Dr. Annamaria Boots"  . FRACTURE SURGERY    . HARDWARE REMOVAL Left 07/27/2017   Procedure: HARDWARE REMOVAL;  Surgeon: Mcarthur Rossetti, Mcpherson;  Location: [facility];  Service: Orthopedics;  Laterality: Left;  . LEFT HEART CATH AND CORONARY ANGIOGRAPHY N/A 10/27/2016   Procedure: Left Heart Cath and Coronary Angiography;  Surgeon: Peter M Martinique, Mcpherson;  Location: [facility] LAB;  Service: Cardiovascular;  Laterality: N/A;  . LEFT HEART CATHETERIZATION WITH CORONARY ANGIOGRAM N/A 08/16/2014   Procedure: LEFT HEART CATHETERIZATION WITH CORONARY ANGIOGRAM;  Surgeon: Sinclair Grooms, Mcpherson;  Location: [facility] CATH LAB;  Service: Cardiovascular;  Laterality: N/A;  . NASAL SINUS SURGERY    . ORIF ANKLE FRACTURE Left 03/08/2017  . ORIF ANKLE FRACTURE Left 07/27/2017   Procedure: Hardware removal left ankle, takedown non-union with allograft bone graft, Intramedullary Nail;  Surgeon: Mcarthur Rossetti, Mcpherson;  Location: [facility];  Service: Orthopedics;  Laterality: Left;  . PARTIAL COLECTOMY  1970s  . TONSILLECTOMY     Social History   Occupational History  . Occupation: Retired Merchant navy officer)     Employer: RETIRED  Tobacco Use  . Smoking status: Former Smoker    Packs/day: 1.00    Years: 41.00    Pack years: 41.00    Types: Cigarettes    Quit date: 01/10/2010    Years since quitting: 10.1  . Smokeless tobacco: Never Used  Vaping Use  . Vaping Use: Former  Substance and Sexual Activity  . Alcohol use: No  . Drug use: No  . Sexual activity: Not Currently    Partners: Female

## 2020-03-14 NOTE — Progress Notes (Signed)
Assessment/Plan:    1.  Essential Tremor  -mild.  Prior DaT in 2015 neg  -worsened by GAD  -start primidone, 50 mg q hs.  R/B/SE were discussed.  The opportunity to ask questions was given and they were answered to the best of my ability.  The patient expressed understanding and willingness to follow the outlined treatment protocols.  -in one month d/c propranolol 20 mg bid.  Subjective:   [name] was seen today in follow up for tremor.  My previous records were reviewed prior to todays visit. Pt has not been seen for this particular problem for about a year.  When I saw her last November, I thought that she had mild essential tremor with anxiety being the primary contributor to it.  She wanted to go off her propranolol because she thought it was making the depression worse (which certainly could be the case).  She returns today stating that tremor is worse.  She didn't go off of the propranolol.  She isn't sure that is making the depression worse, but has depression.  She wonders if there were other meds.  She does state that she has stage 3 kidney disease.  She feels internal tremor.  She has head tremor but she cannot state if in the "no" or "yes" direction.  Tremor interferes with writing but not eating.  It can be right or left but usually not both at the same time.  She is R hand dominant.  Doesn't drink caffeine.  Stress/anger/anxiety will increase it.    Current prescribed movement disorder medications: Propranolol, 10 mg twice per day  ALLERGIES:   Allergies  Allergen Reactions  . Shellfish-Derived Products Anaphylaxis, Shortness Of Breath and Swelling    Throat swells; Iodine is ok  . Lipitor [Atorvastatin] Nausea Only and Other (See Comments)    Fatigue and Lethargy. Leg, back,Joint, and muscle pain. Per patient "My stool was a muddy clay color and I having diarrhea" pt states "I never want to take another statin again"  . Crestor [Rosuvastatin Calcium]      UNSPECIFIED REACTION   . Evolocumab     Other reaction(s): rash  . Metoprolol     Other reaction(s): very tired, blurred vision  . Pravastatin     fatigue  . Prednisone     Other reaction(s): insomnia, shakes  . Zetia [Ezetimibe]     fatigue  . Codeine Nausea And Vomiting    CURRENT MEDICATIONS:  Outpatient Encounter Medications as of 03/15/2020  Medication Sig  . Abaloparatide (TYMLOS) 3120 MCG/1.56ML SOPN Inject 80 mcg into the skin daily. (Patient taking differently: Inject 80 mcg into the skin every 6 (six) months. )  . acetaminophen (TYLENOL) 500 MG tablet 1 tablet as needed  . ALPRAZolam (XANAX) 0.5 MG tablet Take 0.5 mg by mouth 2 (two) times daily.   . Evolocumab (REPATHA SURECLICK) 426 MG/ML SOAJ Inject 1 pen into the skin every 14 (fourteen) days. ON HOLD DUE TO RASH.Marland Kitchen POTENTIAL DRUG ALLERGY 08/23/19  . fluocinonide cream (LIDEX) 8.34 % Apply 1 application topically 2 (two) times daily.  Marland Kitchen FLUoxetine (PROZAC) 40 MG capsule TAKE ONE CAPSULE BY MOUTH ONCE DAILY  . propranolol (INDERAL) 10 MG tablet Take 1 tablet by mouth twice daily  . tetrahydrozoline (REDNESS RELIEVER EYE DROPS) 0.05 % ophthalmic solution 1 drop into affected eye as needed  . traZODone (DESYREL) 50 MG tablet TAKE 1 TABLET BY MOUTH AT BEDTIME (QUALITEST BRAND ONLY [phone])  . triamcinolone cream (  KENALOG) 0.1 % Apply 1 application topically 2 (two) times daily.   . [DISCONTINUED] alendronate (FOSAMAX) 70 MG tablet 1 tablet (Patient not taking: No sig reported)  . [DISCONTINUED] cholecalciferol (VITAMIN D) 1000 units tablet Take 1,000 Units by mouth daily. (Patient not taking: Reported on 03/15/2020)  . [DISCONTINUED] diazepam (VALIUM) 5 MG tablet  (Patient not taking: Reported on 03/15/2020)   No facility-administered encounter medications on file as of 03/15/2020.     Objective:    PHYSICAL EXAMINATION:    VITALS:   Vitals:   03/15/20 1403  BP: 117/79  Pulse: 86  SpO2: 95%  Weight: 133 lb (60.3  kg)  Height: 5\' 2"  (1.575 m)    GEN:  The patient appears stated age and is in NAD. HEENT:  Normocephalic, atraumatic.  The mucous membranes are moist. The superficial temporal arteries are without ropiness or tenderness. CV:  RRR Lungs:  CTAB Neck/HEME:  There are no carotid bruits bilaterally.  Neurological examination:  Orientation: The patient is alert and oriented x3. 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 The speech is fluent and clear. Soft palate rises symmetrically and there is no tongue deviation. Hearing is intact to conversational tone. Sensation: Sensation is intact to light touch throughout Motor: Strength is at least antigravity x4.  Movement examination: Tone: There is normal tone in the UE/LE Abnormal movements: no rest tremor.  Mild postural tremor, R>L with some entrainment.  she has min difficulty with archimedes spirals.  Has forms with her that she filled out and no tremor there. Coordination:  There is no decremation with RAM's Gait and Station: The patient has no difficulty arising out of a deep-seated chair without the use of the hands. The patient's stride length is good I have reviewed and interpreted the following labs independently   Chemistry      Component Value Date/Time   NA 140 06/23/2019 1407   NA 142 06/01/2017 1010   K 4.8 06/23/2019 1407   CL 102 06/23/2019 1407   CO2 26 06/23/2019 1407   BUN 32 (H) 06/23/2019 1407   BUN 24 06/01/2017 1010   CREATININE 1.17 (H) 06/23/2019 1407      Component Value Date/Time   CALCIUM 10.1 06/23/2019 1407   ALKPHOS 56 08/31/2017 0926   AST 28 08/31/2017 0926   ALT 24 08/31/2017 0926   BILITOT 0.3 08/31/2017 0926      Lab Results  Component Value Date   WBC 6.4 07/27/2017   HGB 13.3 07/27/2017   HCT 40.6 07/27/2017   MCV 89.2 07/27/2017   PLT 290 07/27/2017   Lab Results  Component Value Date   TSH 0.67 08/19/2015      Chemistry      Component Value Date/Time   NA 140 06/23/2019 1407   NA 142 06/01/2017 1010   K 4.8 06/23/2019 1407   CL 102 06/23/2019 1407   CO2 26 06/23/2019 1407   BUN 32 (H) 06/23/2019 1407   BUN 24 06/01/2017 1010   CREATININE 1.17 (H) 06/23/2019 1407      Component Value Date/Time   CALCIUM 10.1 06/23/2019 1407   ALKPHOS 56 08/31/2017 0926   AST 28 08/31/2017 0926   ALT 24 08/31/2017 0926   BILITOT 0.3 08/31/2017 0926         Total time spent on today's visit was 30 minutes, including both face-to-face time and nonface-to-face time.  Time included that spent on  review of records (prior notes available to me/labs/imaging if pertinent), discussing treatment and goals, answering patient's questions and coordinating care.  Cc:  Leighton Ruff, MD

## 2020-03-15 ENCOUNTER — Ambulatory Visit: Payer: Medicare PPO | Admitting: Neurology

## 2020-03-15 ENCOUNTER — Encounter: Payer: Self-pay | Admitting: Neurology

## 2020-03-15 ENCOUNTER — Other Ambulatory Visit: Payer: Self-pay

## 2020-03-15 VITALS — BP 117/79 | HR 86 | Ht 62.0 in | Wt 133.0 lb

## 2020-03-15 DIAGNOSIS — G250 Essential tremor: Secondary | ICD-10-CM | POA: Diagnosis not present

## 2020-03-15 MED ORDER — PRIMIDONE 50 MG PO TABS: 50.0000 mg | ORAL_TABLET | Freq: Every day | ORAL | 1 refills | Status: DC

## 2020-03-15 NOTE — Patient Instructions (Signed)
Start primidone 50 mg - 1/2 tablet at bedtime for 1 week and then increase to 1 tablet at bedtime thereafter.  In ONE MONTH, stop propranolol

## 2020-03-22 ENCOUNTER — Ambulatory Visit: Payer: Medicare PPO | Admitting: Neurology

## 2020-03-27 DIAGNOSIS — M4180 Other forms of scoliosis, site unspecified: Secondary | ICD-10-CM | POA: Diagnosis not present

## 2020-03-27 DIAGNOSIS — M419 Scoliosis, unspecified: Secondary | ICD-10-CM | POA: Diagnosis not present

## 2020-03-27 DIAGNOSIS — M5127 Other intervertebral disc displacement, lumbosacral region: Secondary | ICD-10-CM | POA: Diagnosis not present

## 2020-03-27 DIAGNOSIS — M479 Spondylosis, unspecified: Secondary | ICD-10-CM | POA: Diagnosis not present

## 2020-03-27 DIAGNOSIS — M47819 Spondylosis without myelopathy or radiculopathy, site unspecified: Secondary | ICD-10-CM | POA: Diagnosis not present

## 2020-03-29 ENCOUNTER — Encounter: Payer: Self-pay | Admitting: Internal Medicine

## 2020-04-12 ENCOUNTER — Other Ambulatory Visit: Payer: Self-pay

## 2020-04-12 ENCOUNTER — Ambulatory Visit (INDEPENDENT_AMBULATORY_CARE_PROVIDER_SITE_OTHER): Payer: Medicare PPO | Admitting: Neurology

## 2020-04-12 DIAGNOSIS — G245 Blepharospasm: Secondary | ICD-10-CM

## 2020-04-12 MED ORDER — ONABOTULINUMTOXINA 100 UNITS IJ SOLR
25.0000 [IU] | Freq: Once | INTRAMUSCULAR | Status: AC
  Administered 2020-04-12: 25 [IU] via INTRAMUSCULAR

## 2020-04-12 NOTE — Procedures (Signed)
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  Risk discussed included, but were not limited pain and discomfort, bleeding, bruising, excessive weakness, venous thrombosis, muscle atrophy and dysphagia.  A copy of the patient medication guide was given to the patient which explains the blackbox warning.  Patients identity and treatment sites confirmed yes.  Details of Procedure: Skin was cleaned with alcohol.   Prior to injection, the needle plunger was aspirated to make sure the needle was not within a blood vessel.  There was no blood retrieved on aspiration.    Following is a summary of the muscles injected  And the amount of Botulinum toxin used:  Injections  Location Left  Right Units Number of sites        Corrugator 2.5 2.5 2.5   Procerus 2.5 2.5 5.0   Lower Lid, Lateral       Lower Lid Medial  2.5    2.5    Upper Lid, Lateral 2.5   2.5   Upper Lid, Medial      Canthus 2.5 2.5 5.0   Nasalis      [place]      Temporalis 2.5  2.5   Zygomaticus Major 2.5  2.5   TOTAL UNITS:   22.5    Agent: Botulinum Type A ( Onobotulinum Toxin type A ).  1 vials of Botox were used, each containing 50 units and freshly diluted with 2 mL of sterile, non-perserved saline   Total injected (Units): 25.0  Total wasted (Units): 0 Pt tolerated procedure well without complications.

## 2020-04-14 NOTE — Progress Notes (Signed)
Cardiology Office Note   Date:  04/15/2020   ID:  [name], DOB 1946-07-06, MRN [id number]  PCP:  Leighton Ruff, MD  Cardiologist:   Mertie Moores, MD   Chief Complaint  Patient presents with  . Coronary Artery Disease  . Hyperlipidemia   Problem List 1. Chest pain     Oct. 24, 2017:   [name] is a 74 y.o. female who presents for exertional dyspnea / heaviness  started several years ago. Has seen Dr. Wynonia Lawman in the past.  Abnormal stress test  Cath Feb. 4, 2016 showed mid LAD stenosis of 40-50%.    she has exertional chest tightness.  Mid sternal pain, radiates to back of both arms, radiates up into her neck and then to upper chest   Has to stop and rest for the pain to go away.   Walking uphill is worse.   Occurs more often if she has eaten and then exercises.  Has been active and healthy,  Walks regularly . Walked 2 miles a day and has had to slow down. Has slowly gained weight but no sudden weight gain   Nov. 29, 2017:  [name] is doing ok Still having some CP especially if she eats after exercising .  Has CP if she exercises 30 minutes - 1 hour after eat The CP is a tightness in her throat, radiates to her neck, out  through to her shoulders and down both arms  The symptoms are better after starting carvedilol.   The symptoms do still occur though.  October 26, 2016:  [name] presents as a work in visit . Has been having some chest pressure ,  Had just awakened. Turned over in bed and the pain resolved.  Did not have to take a SL NTG. Had some dyspnea.   The following day , she and her husband were walking,  She develeoped CP , asssociated with increased dyspnea.  Had to stop and rest .  Walked slower and made it home ok Last several minutes.   Yesterday she had recurrent CP .  Took a SL NTG prior to walking ( had just eaten and she frequently has CP if she walks after eating . )  Took 2 SL NTG yesterday .  No CP this am .   Her  last last cath was in Feb. 2016.  Had mild - moderate CAD by cath in Feb. 2016.    Dec 01, 2016:  [name] is seen today for follow up of her moderate CAD.  Had a cath in October 27, 2016 showed mild coronary irregularities - LAD 30%. LV EF is hyperdynamic .   We started Toprol XL 12.5 mg last week.   Did not tolerate the Atorvastatin ( abdominal pains, leg, back muscle cramps) .    Seems to be doing well.   No CP.  Had  Has not been walking .   June 01, 2017:   Seen with husband [name], [name]  seen today for follow-up of her moderate coronary artery disease. Tolerating   Feeling well Has not been exercising .   Broke her left leg this past summer - Tibia and fibula each in 4 spots.  Doing PT  / rehab.  So has not been exercising regularly   Has very high lipids intolerent to Atrova and rosuva   Oct.  7, 2020: [name] is seen today for her moderate coronary artery disease, hyperlipidemia Feels better off the statins.  Tried lower and lower doses,  Also tired zetia .   Had a severe left leg fracture ( broke on 11 places )   Has been seen in the lipid clinic.  She has a standing prescription for Praluent.  She wants to wait until all of her muscle aches and pains have resolved before she restarts the Praluent.  I told her that this would probably be in a month.  Oct. 4, 2021:  [name] is seen today for her mild - moderate CAD, hyperlipidemia  No cp, no dyspnea Has chest pain , no dyspnea Has back pain - due to lower back scoliosis.  Is on Repatha for hyperlipidemia  Has a mild skin rash as a result of the repatha.     Past Medical History:  Diagnosis Date  . Anxiety   . Arthritis    "back, neck" (03/08/2017)  . Asthma in child   . Bowel obstruction ([place])   . CAD (coronary artery disease), native coronary artery   . Chronic lower back pain   . Chronic neck pain   . Closed left tibial fracture   . Colon polyps   . Complication of anesthesia    "I was awake  during one of my surgeries" reports that she felt them start the incision and heard them talking reports happened ~ 35 years ago    . Depression   . Diverticulosis   . Hyperlipidemia   . Irritable bowel syndrome   . Migraine    "used to be severe; now maybe 1/year or 2" (03/08/2017)  . Pneumonia    H!N!    Past Surgical History:  Procedure Laterality Date  . ANKLE HARDWARE REMOVAL Left 07/27/2017   Hardware removal left ankle, takedown non-union with allograft bone graft, Intramedullary Nail (Left)  . APPENDECTOMY    . AUGMENTATION MAMMAPLASTY Bilateral   . BRACHIOPLASTY Bilateral    Arm Lift   . CARDIAC CATHETERIZATION    . CHOLECYSTECTOMY OPEN    . COLON SURGERY    . EXTERNAL FIXATION LEG Left 03/08/2017   Procedure: INTERNAL FIXATION LEFT ANKLE;  Surgeon: Newt Minion, MD;  Location: [facility];  Service: Orthopedics;  Laterality: Left;  . EYE MUSCLE SURGERY Bilateral X 2   "lazy eye; OR twice on each eye; Dr. Annamaria Boots"  . FRACTURE SURGERY    . HARDWARE REMOVAL Left 07/27/2017   Procedure: HARDWARE REMOVAL;  Surgeon: Mcarthur Rossetti, MD;  Location: [facility];  Service: Orthopedics;  Laterality: Left;  . LEFT HEART CATH AND CORONARY ANGIOGRAPHY N/A 10/27/2016   Procedure: Left Heart Cath and Coronary Angiography;  Surgeon: Peter M Martinique, MD;  Location: [facility];  Service: Cardiovascular;  Laterality: N/A;  . LEFT HEART CATHETERIZATION WITH CORONARY ANGIOGRAM N/A 08/16/2014   Procedure: LEFT HEART CATHETERIZATION WITH CORONARY ANGIOGRAM;  Surgeon: Sinclair Grooms, MD;  Location: [facility] CATH LAB;  Service: Cardiovascular;  Laterality: N/A;  . NASAL SINUS SURGERY    . ORIF ANKLE FRACTURE Left 03/08/2017  . ORIF ANKLE FRACTURE Left 07/27/2017   Procedure: Hardware removal left ankle, takedown non-union with allograft bone graft, Intramedullary Nail;  Surgeon: Mcarthur Rossetti, MD;  Location: [facility];  Service: Orthopedics;  Laterality: Left;  . PARTIAL COLECTOMY  1970s  .  TONSILLECTOMY       Current Outpatient Medications  Medication Sig Dispense Refill  . acetaminophen (TYLENOL) 500 MG tablet 1 tablet as needed    . ALPRAZolam (XANAX) 0.5 MG tablet Take 0.5  mg by mouth 2 (two) times daily.     . Evolocumab (REPATHA SURECLICK) 878 MG/ML SOAJ Inject 1 pen into the skin every 14 (fourteen) days. ON HOLD DUE TO RASH.Marland Kitchen POTENTIAL DRUG ALLERGY 08/23/19 6 pen 3  . fluocinonide cream (LIDEX) 6.76 % Apply 1 application topically 2 (two) times daily.    Marland Kitchen FLUoxetine (PROZAC) 20 MG tablet Take 1 tablet by mouth daily. Total of 60 mg    . FLUoxetine (PROZAC) 40 MG capsule TAKE ONE CAPSULE BY MOUTH ONCE DAILY 30 capsule 0  . primidone (MYSOLINE) 50 MG tablet Take 1 tablet (50 mg total) by mouth at bedtime. 90 tablet 1  . tetrahydrozoline (REDNESS RELIEVER EYE DROPS) 0.05 % ophthalmic solution 1 drop into affected eye as needed    . traZODone (DESYREL) 50 MG tablet TAKE 1 TABLET BY MOUTH AT BEDTIME (QUALITEST BRAND ONLY [phone]) 30 tablet 5  . triamcinolone cream (KENALOG) 0.1 % Apply 1 application topically 2 (two) times daily.      No current facility-administered medications for this visit.    Allergies:   Shellfish-derived products, Lipitor [atorvastatin], Crestor [rosuvastatin calcium], Evolocumab, Metoprolol, Pravastatin, Prednisone, Zetia [ezetimibe], and Codeine    Social History:  The patient  reports that she quit smoking about 10 years ago. Her smoking use included cigarettes. She has a 41.00 pack-year smoking history. She has never used smokeless tobacco. She reports that she does not drink alcohol and does not use drugs.   Family History:  The patient's family history includes Colon cancer in her maternal grandfather; Coronary artery disease in her father, mother, and sister; Diabetes in her sister; Healthy in her son; Heart disease in her father and mother; Hyperlipidemia in her mother; Hypertension in her mother; Liver disease in her son; Peripheral  vascular disease in her sister; Stroke (age of onset: 97) in her sister.    ROS:  Please see the history of present illness.    Physical Exam: Blood pressure 102/72, pulse 84, height 5\' 2"  (1.575 m), weight 131 lb 9.6 oz (59.7 kg), SpO2 98 %.  GEN:  Well nourished, well developed in no acute distress HEENT: Normal NECK: No JVD; No carotid bruits LYMPHATICS: No lymphadenopathy CARDIAC: RRR , no murmurs, rubs, gallops RESPIRATORY:  Clear to auscultation without rales, wheezing or rhonchi  ABDOMEN: Soft, non-tender, non-distended MUSCULOSKELETAL:  No edema; No deformity  SKIN: Warm and dry NEUROLOGIC:  Alert and oriented x 3    EKG:  NSR at 84.  Nonspecific ST / T wave abn.    Recent Labs: 06/23/2019: BUN 32; Creat 1.17; Potassium 4.8; Sodium 140    Lipid Panel    Component Value Date/Time   CHOL 163 08/31/2017 0926   TRIG 58 08/31/2017 0926   HDL 80 08/31/2017 0926   CHOLHDL 2.0 08/31/2017 0926   CHOLHDL 3 04/24/2010 1413   VLDL 13.2 04/24/2010 1413   LDLCALC 71 08/31/2017 0926   LDLDIRECT 125.2 08/08/2009 1600      Wt Readings from Last 3 Encounters:  04/15/20 131 lb 9.6 oz (59.7 kg)  03/15/20 133 lb (60.3 kg)  06/23/19 131 lb (59.4 kg)      Other studies Reviewed: Additional studies/ records that were reviewed today include: . Review of the above records demonstrates:    ASSESSMENT AND PLAN:  1.  Chest pain . She denies having any recent episodes of chest pain.   2. Hyperlipidemia:  She is on Repatha.  She held it for a couple months because of  a skin rash.  Her LDL numbers increased dramatically.  She is now back on Repatha and seems to be tolerating the rash fairly well.  She has already seen dermatology.  We will continue with the [place].  Last LDL was 89.  We will check labs again in 3 months.  If her LDL remains elevated will consider adding Zetia 10 mg a day.  I will see her again in 1 year.    Mertie Moores, MD  04/15/2020 9:57 AM    [address], [place], [place]  [address] Phone: [phone]; Fax: [phone]

## 2020-04-15 ENCOUNTER — Encounter: Payer: Self-pay | Admitting: Cardiovascular Disease

## 2020-04-15 ENCOUNTER — Ambulatory Visit (INDEPENDENT_AMBULATORY_CARE_PROVIDER_SITE_OTHER): Payer: Medicare PPO | Admitting: Cardiovascular Disease

## 2020-04-15 ENCOUNTER — Other Ambulatory Visit: Payer: Self-pay

## 2020-04-15 VITALS — BP 102/72 | HR 84 | Ht 62.0 in | Wt 131.6 lb

## 2020-04-15 DIAGNOSIS — E782 Mixed hyperlipidemia: Secondary | ICD-10-CM

## 2020-04-15 DIAGNOSIS — I2510 Atherosclerotic heart disease of native coronary artery without angina pectoris: Secondary | ICD-10-CM

## 2020-04-15 NOTE — Patient Instructions (Addendum)
Medication Instructions:  Your physician recommends that you continue on your current medications as directed. Please refer to the Current Medication list given to you today.  *If you need a refill on your cardiac medications before your next appointment, please call your pharmacy*   Lab Work: Your physician recommends that you return for lab work in: 3 months on Tuesday Jan. 11 - you can come in anytime after 7:30 am. You will need to FAST for this appointment - nothing to eat or drink after midnight the night before except water.   If you have labs (blood work) drawn today and your tests are completely normal, you will receive your results only by: Marland Kitchen MyChart Message (if you have MyChart) OR . A paper copy in the mail If you have any lab test that is abnormal or we need to change your treatment, we will call you to review the results.   Testing/Procedures: None Ordered   Follow-Up: At [facility], you and your health needs are our priority.  As part of our continuing mission to provide you with exceptional heart care, we have created designated Provider Care Teams.  These Care Teams include your primary Cardiologist (physician) and Advanced Practice Providers (APPs -  Physician Assistants and Nurse Practitioners) who all work together to provide you with the care you need, when you need it.   Your next appointment:   1 year(s)  The format for your next appointment:   In Person  Provider:   You may see Mertie Moores, MD or one of the following Advanced Practice Providers on your designated Care Team:    Richardson Dopp, PA-C  [place], [place]

## 2020-05-14 ENCOUNTER — Telehealth: Payer: Self-pay

## 2020-05-14 NOTE — Telephone Encounter (Signed)
Contacted patient to schedule nurse visit for her Prolia injection-LMTCB to schedule-she owes $0 at check-in and PA is good until 07/12/20

## 2020-05-23 ENCOUNTER — Ambulatory Visit: Payer: Medicare PPO

## 2020-05-30 ENCOUNTER — Other Ambulatory Visit: Payer: Self-pay

## 2020-05-30 ENCOUNTER — Ambulatory Visit (INDEPENDENT_AMBULATORY_CARE_PROVIDER_SITE_OTHER): Payer: Medicare PPO | Admitting: *Deleted

## 2020-05-30 DIAGNOSIS — M810 Age-related osteoporosis without current pathological fracture: Secondary | ICD-10-CM | POA: Diagnosis not present

## 2020-05-30 MED ORDER — DENOSUMAB 60 MG/ML ~~LOC~~ SOSY
60.0000 mg | PREFILLED_SYRINGE | Freq: Once | SUBCUTANEOUS | Status: AC
Start: 2020-05-30 — End: 2020-05-30
  Administered 2020-05-30: 60 mg via SUBCUTANEOUS

## 2020-05-30 NOTE — Progress Notes (Signed)
Per orders of Dr Cruzita Lederer, injection of denosumab (PROLIA) injection 60 mg given by Jacqualin Combes, CMA.. Patient tolerated injection well.

## 2020-06-26 ENCOUNTER — Other Ambulatory Visit: Payer: Self-pay

## 2020-06-26 ENCOUNTER — Encounter: Payer: Self-pay | Admitting: Family Medicine

## 2020-06-26 ENCOUNTER — Ambulatory Visit (INDEPENDENT_AMBULATORY_CARE_PROVIDER_SITE_OTHER): Payer: Medicare PPO | Admitting: Family Medicine

## 2020-06-26 VITALS — BP 98/58 | HR 75 | Temp 97.9°F | Ht 61.5 in | Wt 132.2 lb

## 2020-06-26 DIAGNOSIS — E7800 Pure hypercholesterolemia, unspecified: Secondary | ICD-10-CM

## 2020-06-26 DIAGNOSIS — E559 Vitamin D deficiency, unspecified: Secondary | ICD-10-CM | POA: Diagnosis not present

## 2020-06-26 DIAGNOSIS — I2510 Atherosclerotic heart disease of native coronary artery without angina pectoris: Secondary | ICD-10-CM | POA: Diagnosis not present

## 2020-06-26 DIAGNOSIS — F320 Major depressive disorder, single episode, mild: Secondary | ICD-10-CM | POA: Diagnosis not present

## 2020-06-26 DIAGNOSIS — G8929 Other chronic pain: Secondary | ICD-10-CM | POA: Diagnosis not present

## 2020-06-26 DIAGNOSIS — M5416 Radiculopathy, lumbar region: Secondary | ICD-10-CM | POA: Diagnosis not present

## 2020-06-26 DIAGNOSIS — N183 Chronic kidney disease, stage 3 unspecified: Secondary | ICD-10-CM | POA: Diagnosis not present

## 2020-06-26 DIAGNOSIS — M810 Age-related osteoporosis without current pathological fracture: Secondary | ICD-10-CM | POA: Diagnosis not present

## 2020-06-26 DIAGNOSIS — M545 Low back pain, unspecified: Secondary | ICD-10-CM | POA: Diagnosis not present

## 2020-06-26 DIAGNOSIS — Z86010 Personal history of colonic polyps: Secondary | ICD-10-CM | POA: Diagnosis not present

## 2020-06-26 DIAGNOSIS — F419 Anxiety disorder, unspecified: Secondary | ICD-10-CM

## 2020-06-26 NOTE — Progress Notes (Signed)
Patient: [name] MRN: [id number] DOB: 06/18/1946 PCP: Orma Flaming, MD     Subjective:  Chief Complaint  Patient presents with  . Hyperlipidemia  . Depression  . Anxiety  . Coronary Artery Disease  . Osteoporosis  . vitamin d deficiency    HPI: The patient is a 74 y.o. female who presents today to establish care.  She has a past medical history significant for anxiety and depression, CAD with last cath done in 2018, stable from 2016 ith 30% stenosis of mid LAD, hyperlipidemia, essential tremor, osteoporosis, stage 3 CKD, and vitamin D deficiency.   1) anxiety and depression She has a long history of this, but a few years ago her son got very sick and nearly died. This made her anxiety much worse and still ebbs and flows. She also found out at that time that he had an alcohol issue. He is doing much better but she still has anxiety about the ordeal and his health. Takes xanax daily and has been on it for years. Also helps her tremors. 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 She seems to be managing okay. Not in counseling, but exercises daily. Good support system, great husband and friends.   2) essential tremor -followed by Dr. Carles Collet with neurology. On mysoline daily.   3) CAD Heart cath in 2016 and 2018. No stents placed. Strong family hx of heart disease. Coronary CTA 08/10/14 showed moderate stenosis of the mid LAD and cath done. Cath: mild nonobstructive CAD 30% stenosed. Left ventricular EF > 65%. No change from 2016. On repatha, can not tolerate statins. Statins near to goal from 01/2020. Followed by Dr. Sondra Come.   4) history of colonic polyps -report showed hyperplastic only.   5) vitamin  D deficiency -checked 01/2020 and normal  6) hyperlipidemia Has been tried on numerous statins and had myopathy currently on repatha. Strong family hx of heart disease with MI in both parents. She has remote past hx of smoking. No diabetes.    7) osteoporosis Followed by endocrinology. Gets prolia q 6 months. Does walk daily. No daily calcium.   -no family history of breast or colon cancer. She thinks she had mmg last year.   Has had her covid vaccines. No booster yet.   Review of Systems  Constitutional: Negative for chills, fatigue and fever.  HENT: Negative for dental problem, ear pain, hearing loss and trouble swallowing.   Eyes: Negative for visual disturbance.  Respiratory: Negative for cough, chest tightness and shortness of breath.   Cardiovascular: Negative for chest pain, palpitations and leg swelling.  Gastrointestinal: Negative for abdominal pain, blood in stool, diarrhea and nausea.  Endocrine: Negative for cold intolerance, polydipsia, polyphagia and polyuria.  Genitourinary: Negative for dysuria, frequency, hematuria, pelvic pain and urgency.  Musculoskeletal: Negative for arthralgias.  Skin: Negative for rash.  Neurological: Negative for dizziness, light-headedness and headaches.  Psychiatric/Behavioral: Negative for dysphoric mood and sleep disturbance. The patient is not nervous/anxious.     Allergies Patient is allergic to shellfish-derived products, lipitor [atorvastatin], crestor [rosuvastatin calcium], metoprolol, pravastatin, prednisone, zetia [ezetimibe], and codeine.  Past Medical History Patient  has a past medical history of Anxiety, Arthritis, Asthma in child, Bowel obstruction ([place]), CAD (coronary artery disease), native coronary artery, Chronic lower back pain, Chronic neck pain, Closed left tibial fracture, Colon polyps, Complication of anesthesia, Depression, Diverticulosis, Hyperlipidemia, Irritable bowel syndrome, Migraine, and Pneumonia.  Surgical History Patient  has a past surgical history  that includes Partial colectomy (1970s); left heart catheterization with coronary angiogram (N/A, 08/16/2014); LEFT HEART CATH AND CORONARY ANGIOGRAPHY (N/A, 10/27/2016); Tonsillectomy; ORIF ankle  fracture (Left, 03/08/2017); Appendectomy; Cholecystectomy open; Colon surgery; Augmentation mammaplasty (Bilateral); Fracture surgery; Eye muscle surgery (Bilateral, X 2); Nasal sinus surgery; Cardiac catheterization; Brachioplasty (Bilateral); External fixation leg (Left, 03/08/2017); Ankle hardware removal (Left, 07/27/2017); ORIF ankle fracture (Left, 07/27/2017); and Hardware Removal (Left, 07/27/2017).  Family History Pateint's family history includes Colon cancer in her maternal grandfather; Coronary artery disease in her father, mother, and sister; Diabetes in her sister; Healthy in her son; Heart disease in her father and mother; Hyperlipidemia in her mother; Hypertension in her mother; Liver disease in her son; Peripheral vascular disease in her sister; Stroke (age of onset: 89) in her sister.  Social History Patient  reports that she quit smoking about 10 years ago. Her smoking use included cigarettes. She has a 41.00 pack-year smoking history. She has never used smokeless tobacco. She reports that she does not drink alcohol and does not use drugs.    Objective: Vitals:   06/26/20 0941  BP: (!) 98/58  Pulse: 75  Temp: 97.9 F (36.6 C)  TempSrc: Temporal  SpO2: 99%  Weight: 132 lb 3.2 oz (60 kg)  Height: 5' 1.5" (1.562 m)    Body mass index is 24.57 kg/m.  Physical Exam Vitals reviewed.  Constitutional:      Appearance: Normal appearance. She is well-developed, normal weight and well-nourished.  HENT:     Head: Normocephalic and atraumatic.     Right Ear: Tympanic membrane, ear canal and external ear normal.     Left Ear: Tympanic membrane, ear canal and external ear normal.     Nose: Nose normal.     Mouth/Throat:     Mouth: Oropharynx is clear and moist.  Eyes:     Extraocular Movements: EOM normal.     Conjunctiva/sclera: Conjunctivae normal.     Pupils: Pupils are equal, round, and reactive to light.  Neck:     Thyroid: No thyromegaly.     Vascular: No carotid  bruit.  Cardiovascular:     Rate and Rhythm: Normal rate and regular rhythm.     Pulses: Normal pulses and intact distal pulses.     Heart sounds: Normal heart sounds. No murmur heard.   Pulmonary:     Effort: Pulmonary effort is normal.     Breath sounds: Normal breath sounds.  Abdominal:     General: Abdomen is flat. Bowel sounds are normal. There is no distension.     Palpations: Abdomen is soft.     Tenderness: There is no abdominal tenderness.  Musculoskeletal:     Cervical back: Normal range of motion and neck supple.  Lymphadenopathy:     Cervical: No cervical adenopathy.  Skin:    General: Skin is warm and dry.     Capillary Refill: Capillary refill takes less than 2 seconds.     Findings: No rash.  Neurological:     General: No focal deficit present.     Mental Status: She is alert and oriented to person, place, and time.     Cranial Nerves: No cranial nerve deficit.     Coordination: Coordination normal.     Deep Tendon Reflexes: Reflexes normal.  Psychiatric:        Mood and Affect: Mood and affect and mood normal.        Behavior: Behavior normal.     GAD 7 score: 13  [place] Office Visit from 06/26/2020 in [facility]  PHQ-2 Total Score 0      Assessment/plan: 1. Coronary artery disease involving native coronary artery of native heart without angina pectoris -on repatha and followed by cardiology. Last cath 2018. Records reviewed.   2. Anxiety GAD7 score is mild to moderate, but she states she is well controlled and it just ebbs and flows. Good support system and exercises daily.   3. Current mild episode of major depressive disorder, unspecified whether recurrent ([place]) On prozac 60mg  which was started by her psychiatrist years ago. Her phq2 score is a zero and she is well controlled. No refills needed. She exercises daily.   4. History of colonic polyps Hyperplastic only in 2014. Repeat in 10 years.   5. Vitamin D  deficiency, unspecified To goal, recently checked in July of this year. Scanned into chart.   6. Hyperlipidemia On repatha. Followed by cards. Lipid panel in 01/2020 near goal with LDL of 85. Exercises daily.   7. Stage 3 chronic kidney disease, unspecified whether stage 3a or 3b CKD (HCC) GFR steady. Repeat labs in 3-6 months.   Records reviewed from her chart. Requesting other records from previous physician.   This visit occurred during the SARS-CoV-2 public health emergency.  Safety protocols were in place, including screening questions prior to the visit, additional usage of staff PPE, and extensive cleaning of exam room while observing appropriate contact time as indicated for disinfecting solutions.     Return in about 6 months (around 12/25/2020) for routine follow up and labs. Orma Flaming, MD [place]   06/26/2020

## 2020-06-26 NOTE — Patient Instructions (Signed)
-  so nice to meet you!! I think you are doing great.  -plan to see you every 6 months, labs at next visit.  -merry christmas!!!  Dr. Rogers Blocker

## 2020-07-19 ENCOUNTER — Ambulatory Visit: Payer: Medicare PPO | Admitting: Neurology

## 2020-07-23 ENCOUNTER — Other Ambulatory Visit: Payer: Self-pay

## 2020-07-23 ENCOUNTER — Other Ambulatory Visit: Payer: Medicare PPO | Admitting: *Deleted

## 2020-07-23 DIAGNOSIS — I2510 Atherosclerotic heart disease of native coronary artery without angina pectoris: Secondary | ICD-10-CM | POA: Diagnosis not present

## 2020-07-23 DIAGNOSIS — E782 Mixed hyperlipidemia: Secondary | ICD-10-CM | POA: Diagnosis not present

## 2020-07-24 LAB — HEPATIC FUNCTION PANEL
ALT: 17 IU/L (ref 0–32)
AST: 22 IU/L (ref 0–40)
Albumin: 4.6 g/dL (ref 3.7–4.7)
Alkaline Phosphatase: 32 IU/L — ABNORMAL LOW (ref 44–121)
Bilirubin Total: 0.4 mg/dL (ref 0.0–1.2)
Bilirubin, Direct: 0.14 mg/dL (ref 0.00–0.40)
Total Protein: 6.5 g/dL (ref 6.0–8.5)

## 2020-07-24 LAB — BASIC METABOLIC PANEL
BUN/Creatinine Ratio: 24 (ref 12–28)
BUN: 29 mg/dL — ABNORMAL HIGH (ref 8–27)
CO2: 23 mmol/L (ref 20–29)
Calcium: 9.5 mg/dL (ref 8.7–10.3)
Chloride: 103 mmol/L (ref 96–106)
Creatinine, Ser: 1.21 mg/dL — ABNORMAL HIGH (ref 0.57–1.00)
GFR calc Af Amer: 52 mL/min/{1.73_m2} — ABNORMAL LOW (ref >59)
GFR calc non Af Amer: 45 mL/min/{1.73_m2} — ABNORMAL LOW (ref >59)
Glucose: 94 mg/dL (ref 65–99)
Potassium: 4.6 mmol/L (ref 3.5–5.2)
Sodium: 140 mmol/L (ref 134–144)

## 2020-07-24 LAB — LIPID PANEL
Chol/HDL Ratio: 2.4 ratio (ref 0.0–4.4)
Cholesterol, Total: 206 mg/dL — ABNORMAL HIGH (ref 100–199)
HDL: 85 mg/dL (ref >39)
LDL Chol Calc (NIH): 108 mg/dL — ABNORMAL HIGH (ref 0–99)
Triglycerides: 74 mg/dL (ref 0–149)
VLDL Cholesterol Cal: 13 mg/dL (ref 5–40)

## 2020-08-12 ENCOUNTER — Telehealth: Payer: Self-pay

## 2020-08-12 MED ORDER — TRAZODONE HCL 50 MG PO TABS: ORAL_TABLET | ORAL | 1 refills | Status: DC

## 2020-08-12 MED ORDER — ALPRAZOLAM 0.5 MG PO TABS: 0.5000 mg | ORAL_TABLET | Freq: Two times a day (BID) | ORAL | 0 refills | Status: DC

## 2020-08-12 NOTE — Telephone Encounter (Signed)
..   LAST APPOINTMENT DATE: 06/26/2020   NEXT APPOINTMENT DATE:@Visit  date not found  MEDICATION:ALPRAZolam (XANAX) 0.5 MG tablet traZODone (DESYREL) 50 MG tablet     [place], [place] - [address]  Let patient know to contact pharmacy at the end of the day to make sure medication is ready.  Please notify patient to allow 48-72 hours to process  Encourage patient to contact the pharmacy for refills or they can request refills through [facility]:   LAST REFILL:  QTY:  REFILL DATE:    OTHER COMMENTS:    Okay for refill?  Please advise

## 2020-08-20 ENCOUNTER — Encounter: Payer: Self-pay | Admitting: Neurology

## 2020-08-20 NOTE — Progress Notes (Signed)
2/8- set up acct with [facility] and gave verbal for her Botox.

## 2020-08-21 NOTE — Progress Notes (Signed)
[name]: [id number] - PA Case ID: [id number] - Rx #: [id number] Need help? Call us at [phone] Outcome Approvedtoday PA Case: [id number], Status: Approved, Coverage Starts on: 07/13/2020 12:00:00 AM, Coverage Ends on: 07/12/2021 12:00:00 AM. Questions? Contact [phone]. Drug Botox 100UNIT solution Form Nurse, adult and Medical Benefit PA Form

## 2020-09-02 ENCOUNTER — Other Ambulatory Visit: Payer: Self-pay | Admitting: Family Medicine

## 2020-09-02 NOTE — Progress Notes (Signed)
Called [facility] to set up delivery and they said they were waiting on pt consent. So I called the patient and she said she wanted to hold off at this time for Botox because of the cost. 120$ for Botox and then 40$ copay and 40$ injection fee. She was having to pay for root canal right now and would call back to R/S when she could afford it.

## 2020-09-07 ENCOUNTER — Other Ambulatory Visit: Payer: Self-pay | Admitting: Cardiovascular Disease

## 2020-09-13 ENCOUNTER — Ambulatory Visit: Payer: Medicare PPO | Admitting: Neurology

## 2020-10-14 ENCOUNTER — Telehealth: Payer: Self-pay

## 2020-10-14 MED ORDER — FLUOXETINE HCL 20 MG PO CAPS: 20.0000 mg | ORAL_CAPSULE | Freq: Every day | ORAL | 1 refills | Status: DC

## 2020-10-14 MED ORDER — FLUOXETINE HCL 40 MG PO CAPS: 40.0000 mg | ORAL_CAPSULE | Freq: Every day | ORAL | 1 refills | Status: DC

## 2020-10-14 NOTE — Telephone Encounter (Signed)
Pt pharmacy [facility] [address]) requesting both Fluoxetine 20 mg and 40 mg cap LOV: 06/26/2020 No future Visits scheduled  Please Advise for refill

## 2020-10-25 ENCOUNTER — Other Ambulatory Visit: Payer: Self-pay | Admitting: Family Medicine

## 2020-10-28 ENCOUNTER — Telehealth: Payer: Self-pay

## 2020-10-28 NOTE — Telephone Encounter (Signed)
Let her know there is no TEBA or TEVA. I can send in name brand of the trazodone called deseryl. Which pharmacy would she like this sent to?  Dr. Rogers Blocker

## 2020-10-28 NOTE — Telephone Encounter (Signed)
I spoke with the pt to discuss concerns below. I explained per advise from Dr. Rogers Blocker, that Prozac (Fluoxetine) does not make a 60 mg (pill), she has to continue to take 2 different pills. She is also requesting (TEVA) be sent to [facility] for refill. Pt says that [facility] does not cover Desyrel. I believe there is some confusion with which medication she needs to be taking.  Please advise.

## 2020-10-28 NOTE — Telephone Encounter (Signed)
MEDICATION: FLUoxetine (PROZAC) 20 MG capsule  FLUoxetine (PROZAC) 40 MG capsule  traZODone (DESYREL) 50 MG tablet  PHARMACY: [facility] [address]  Comments: Pt states she was previously prescribed trazodone (TEBA) 50 mg instead of the desyrel. Pt states [facility] does not carry the desyrel. Pt also states she knows she has to take 60 mg of the prozac. Pt asked if Dr. Rogers Blocker could just send in 1 prescription of 60 mg instead of the two prescriptions. Pt also asked if there was any way she could get Dr. Donzetta Matters name on all of her prescriptions instead of her old provider so she does not have to keep calling every time she needs a new refill. Please advise.   **Let patient know to contact pharmacy at the end of the day to make sure medication is ready. **  ** Please notify patient to allow 48-72 hours to process**  **Encourage patient to contact the pharmacy for refills or they can request refills through [facility]**

## 2020-10-29 ENCOUNTER — Other Ambulatory Visit: Payer: Self-pay | Admitting: Family Medicine

## 2020-10-29 ENCOUNTER — Other Ambulatory Visit: Payer: Self-pay

## 2020-10-29 NOTE — Telephone Encounter (Signed)
I spoke with the pharmacy. In the Sig for Trazodone given by Dr. Linda Hedges, it states (Qualitest brand only). [facility] states that this is the only brand (Teva) they have of Trazodone. Okay to give verbal order?

## 2020-10-30 ENCOUNTER — Telehealth: Payer: Self-pay

## 2020-10-30 MED ORDER — ALPRAZOLAM 0.5 MG PO TABS: 0.5000 mg | ORAL_TABLET | Freq: Two times a day (BID) | ORAL | 0 refills | Status: DC

## 2020-10-30 MED ORDER — TRAZODONE HCL 50 MG PO TABS: ORAL_TABLET | ORAL | 1 refills | Status: DC

## 2020-10-30 NOTE — Telephone Encounter (Signed)
Sent in [place] and xanax.  Orma Flaming, MD [facility]

## 2020-10-30 NOTE — Telephone Encounter (Signed)
Please advise for refill below.  Thank you.

## 2020-10-30 NOTE — Telephone Encounter (Signed)
MEDICATION: ALPRAZolam (XANAX) 0.5 MG tablet  PHARMACY:  [facility] [address], [place] [address] Phone:  [phone]  Fax:  [phone]       Comments: [facility] called and said they haven't received the prescription   **Let patient know to contact pharmacy at the end of the day to make sure medication is ready. **  ** Please notify patient to allow 48-72 hours to process**  **Encourage patient to contact the pharmacy for refills or they can request refills through [facility]**

## 2020-11-05 ENCOUNTER — Telehealth: Payer: Self-pay

## 2020-11-05 DIAGNOSIS — M810 Age-related osteoporosis without current pathological fracture: Secondary | ICD-10-CM

## 2020-11-05 NOTE — Telephone Encounter (Signed)
Prolia VOB initiated via [email or web address]  Last OV 06/30/19 Next OV Last Prolia inj 05/30/20 Next Prolia inj due 11/28/20

## 2020-11-06 NOTE — Telephone Encounter (Signed)
MEDICAL BENEFIT SUMMARY Patient Out-of-Pocket Responsibility Coverage Available Authorization Required Deductible Co-pay/Coinsurance Prolia OOP COST PHYSICIAN FACILITY FEE ADMIN FEE PURCHASE OR REFERRAL: YES YES PA Required PRIMARY No SECONDARY No 0%* No* 0%* SPECIALTY PHARMACY (via Medical Benefit): YES YES PA Required 0%* No* 0%* *Reflects patient costs once plan deductible is met. Please see Medical Benefit Details for further information regarding patient costs. Patient costs may vary based on services rendered. BENEFITS VERIFIED FOR THE FOLLOWING DIAGNOSIS AND INSURANCE PLANS Verified for Diagnosis M81.0 Site of Care  MD Office   Policy Level: Primary Policy Status: Active Payer Name: [facility] Plan Name: MEDICARE PPO NON-GATE [place] Number: [id number] Employer Name: [employer] Plan Type: PPO Group Number: [id number]  Payer Phone: [phone] PRIMARY MEDICAL BENEFIT DETAILS (PHYSICIAN PURCHASE, OR REFERRAL TO TREATING SITE) COVERAGE AVAILABLE: Yes COVERAGE DETAILS: Product will be covered at 100% of the contracted rate. No deductible or coinsurance applies. The benefits provided on this Verification of Benefits form are Medical Benefits and are the patient's In-Network benefits for Prolia. If you would like Pharmacy Benefits for Prolia, please call [phone].   AUTHORIZATION REQUIRED: Yes   PA PROCESS DETAILS: PA is required. PA can be initiated by calling [phone] or online at [email or web address]

## 2020-11-09 NOTE — Telephone Encounter (Addendum)
PA initiated via [email or web address]  Barrett Holthaus (Key: [id number])  Your information has been submitted to [facility]. [employer] will review the request and will issue a decision, typically within 3-7 days from your submission. You can check the updated outcome later by reopening this request.  If [employer] has not responded in 3-7 days or if you have any questions about your ePA request, please contact [employer] at [phone]. If you think there may be a problem with your PA request, use our live chat feature at the bottom right.  For [place] requests, please call [phone].     The following medications may be covered If clinically appropriate, you may change the prescription. A therapeutic alternative may be available. Questions on alternatives? We're here to help. [place] at [phone]. You can also review [employer] NCPDP 2017's formulary online or call them directly. Compare the original medication with possible alternatives:  PA REQUIREMENT Prolia 60MG /ML syringes Not Required Alendronate 70 Mg Tab Not Required Evenity 105 Mg/1.17 Ml Syrg Required Evenity 210Mg /2.34Ml ( 105Mg /1.17Mlx2) Syrg Required Ibandronate (Soln) Required Ibandronate (Syrg) Required Ibandronate (Tab) Required Risedronate 150 Mg Tab Required Teriparatide Required Tymlos Required Xgeva Required Zoledronic Acid-Mannitol-Water 5 Mg/100 Ml Pgbk Required Terms of service apply. Alternatives and PA Requirements listed above are based on third party available formulary data and may not apply to all plans. Check patient's specific plan formulary.

## 2020-11-09 NOTE — Telephone Encounter (Signed)
HPI  [name] is a 75 y.o.-year-old female, initially referred by her PCP, Dr. Leighton Ruff, returning for follow-up for osteoporosis (OP).  I previously saw the patient - in 08/2015 for an elevated free T4.  Last visit for osteoporosis was a week ago.  Pt was dx with OP in 2016.  Reviewed DEXA scan reports: Date L1-L3 T score FN T score 33% distal Radius  05/19/2019 ([facility])  -2.2 RFN: -2.8 LFN: -2.7 n/a  05/03/2017 ([facility])  -2.2 (-2.4 for comparison with 2020) RFN: -2.6 LFN: -2.9 n/a  01/17/2015 ([facility])  -1.8 RFN: -2.4 LFN: -2.6 n/a  2014  -1.8 RFN: -2.3 LFN: -2.4 n/a   She has a history of fractures of L tibia-fibula when stepping off a treadmill 02/2017 (had a rod placed) and also nasal bones at the same time.  She has occasional falls-treat pain.  She denies dizziness/vertigo/orthostasis/poor vision.   Previous OP treatments:  - Fosamax 70 mg weekly off and on for 20 years  At last visit, we checked her labs and her kidney function was still low (GFR 47), calcium and PTH levels were normal, vitamin D was normal, phosphorus slightly high, N-telopeptide suppressed: Component     Latest Ref Rng & Units 06/23/2019  Glucose     65 - 99 mg/dL 91  BUN     7 - 25 mg/dL 32 (H)  Creatinine     0.60 - 0.93 mg/dL 1.17 (H)  GFR, Est Non African American     > OR = 60 mL/min/1.21m2 47 (L)  Calcium     8.6 - 10.4 mg/dL 10.1  VITD     30.00 - 100.00 ng/mL 53.73  PTH, Intact     15 - 65 pg/mL 16  Phosphorus     2.3 - 4.6 mg/dL 4.7 (H)  NTX Telopeptide     6.2 - 19.0 nmol BCE/L 6.3   Since her N-telopeptide was suppressed, I did not feel that she would necessarily benefit from Prolia and I suggested Forteo or Tymlos for 2 years.  Patient scheduled this appointment to discuss about these options.  She has a history of vitamin D deficiency, but most recent levels were normal: 02/16/2019: Vitamin D 41.74 04/15/2018: Vitamin D 31.4 Recent Labs       Lab Results   Component Value Date   VD25OH 53.73 06/23/2019   VD25OH 41.72 08/19/2015     She is on vitamin D 1000 units daily  No weight bearing exercises, but walks 4-5 times a week 2 miles a day with her husband.  She was getting steroid injections every 6 months in spine (DDD, spinal stenosis)-for years and stopped in 2017.  She does not take high vitamin A doses.  Menopause was at 75 years old.  She did not have HRT.  No FH of osteoporosis.  No hyper or hypocalcemia or hyperparathyroidism.  No history of kidney stones. 11/11/2018: Calcium 9.4 (8.6-10.3) Recent Labs       Lab Results  Component Value Date   PTH 16 06/23/2019   CALCIUM 10.1 06/23/2019   CALCIUM 9.3 07/27/2017   CALCIUM 9.9 06/01/2017   CALCIUM 8.9 03/08/2017   CALCIUM 10.0 02/24/2017   CALCIUM 9.6 12/01/2016   CALCIUM 9.8 10/26/2016   CALCIUM 9.4 01/10/2013   CALCIUM 9.2 08/23/2012   CALCIUM 9.3 07/22/2011     TFTs have been normal:  Recent Labs       Lab Results  Component Value Date   TSH 0.67  08/19/2015   TSH 0.94 08/23/2012   TSH 0.56 08/03/2011   TSH 0.67 04/24/2010   TSH 0.57 08/08/2009     She has CKD stage III. Last BUN/Cr: 11/11/2018: 38/1.13, GFR 47 Recent Labs       Lab Results  Component Value Date   BUN 32 (H) 06/23/2019   CREATININE 1.17 (H) 06/23/2019     Has a h/o HL - could not tolerate stating >> significant mm pain.  On Repatha now.    Plan: 1. Osteoporosis -Likely postmenopausal/age-related (she has a history of early menopause without HRT), also, possibly steroid induced (she received spinal injections with steroids for many years and if she stopped after our last visit in 2017). -At last visit, and again today, we discussed about increasing risk of fracture, depending on T score, greatly increased when the T score is lower than -2.5.  Her lower score on the latest DXA scan was -2.8. -She has a history of fragility fracture in 2018 and this was  slow to heal -At last visit we discussed about the recommended dietary and supplemental calcium and vitamin D intake.  Her vitamin D level was normal.  She is taking 1000 units daily. -She was not exercising at last visit other than walking and I advised her to increase this and given her the National osteoporosis foundation recommendations for weightbearing exercises.  We also discussed fall precautions. -At this visit, we again reviewed different medication classes for osteoporosis, benefits, and side effects.  She has been on Fosamax for many years and based on her recent results, Prolia would not benefit her too much since she had an N-telopeptide, therefore, her bone destruction is not high.  I suggested an anabolic agent which teriparatide/abaloparatide which are daily subcutaneous medications taken for 2 years.  She does not have a history of hypercalcemia or hyperparathyroidism.  She also does not have a history of radiation therapy to the skeleton for cancer.  We did discuss about the risk of headaches and constipation with his agents.  We also discussed about taking the midnight, rather than in the morning due to possible dizziness.  Osteosarcoma is an extremely rare side effect. -For now, I asked her to perform a 24-hour urine collection and discussed about how to do this.  After I received the results, we will start the preauthorization for Forteo or Tymlos, which ever covered by her insurance. - will check a new DXA scan in 2 years after starting medication-  the first indication that the treatment is working is her not having anymore fractures. DXA scan changes are secondary: Unchanged or higher T-scores are desirable. - I we will see her back in 3 months

## 2020-11-12 NOTE — Telephone Encounter (Signed)
[facility] Medicare PA APPROVED PA# [id number] Valid 11/06/19-07/12/21    [place] Key: [id number] - PA Case ID: [id number] Need help? Call us at [phone] Outcome Approvedon May 1 PA Case: [id number], Status: Approved, Coverage Starts on: 11/06/2019 12:00:00 AM, Coverage Ends on: 07/12/2021 12:00:00 AM. Questions? Contact [phone]. Drug Prolia 60MG /ML syringes Form Nurse, adult and Medical Benefit PA Form

## 2020-11-12 NOTE — Telephone Encounter (Addendum)
Pt ready for scheduling on or after 11/28/20  Out-of-pocket cost due at time of visit: $0.00  Prolia co-insurance: 0% Admin fee co-insurance: 0%  Deductible does not apply  Prior Auth APPROVED valid 11/06/19-07/12/21

## 2020-11-22 ENCOUNTER — Telehealth: Payer: Self-pay | Admitting: Cardiovascular Disease

## 2020-11-22 NOTE — Telephone Encounter (Signed)
Spoke with pt and advised per her labs 07/2020 Dr Acie Fredrickson recommended pt fofllow up at her next appointment 10/22 at which time he would discuss possibly the benefit of Rosuvastatin. Pt advised when she calls to schedule 04/2021 appointment she can also ask that Dr Acie Fredrickson order labs prior to that appointment.  Pt verbalizes understanding and thanked Therapist, sports for the call.

## 2020-11-22 NOTE — Telephone Encounter (Signed)
Pt is calling in regards to having some lab work for cholesterol, Pt would like to know if our office can put in an order for labwork. Pt is not due to see dr Acie Fredrickson until Oct 2022

## 2020-12-02 ENCOUNTER — Other Ambulatory Visit: Payer: Self-pay | Admitting: Family Medicine

## 2020-12-04 ENCOUNTER — Ambulatory Visit (INDEPENDENT_AMBULATORY_CARE_PROVIDER_SITE_OTHER): Payer: Medicare PPO

## 2020-12-04 ENCOUNTER — Other Ambulatory Visit: Payer: Self-pay

## 2020-12-04 DIAGNOSIS — M810 Age-related osteoporosis without current pathological fracture: Secondary | ICD-10-CM | POA: Diagnosis not present

## 2020-12-04 MED ORDER — DENOSUMAB 60 MG/ML ~~LOC~~ SOSY
60.0000 mg | PREFILLED_SYRINGE | Freq: Once | SUBCUTANEOUS | Status: AC
  Administered 2020-12-04: 60 mg via SUBCUTANEOUS

## 2020-12-04 NOTE — Progress Notes (Signed)
Prolia injection administered to pt's left arm. Pt tolerated well. °

## 2020-12-07 NOTE — Telephone Encounter (Signed)
Prolia inj received 12/04/20 Next due 06/07/21

## 2021-01-11 ENCOUNTER — Other Ambulatory Visit: Payer: Self-pay | Admitting: Family Medicine

## 2021-01-15 ENCOUNTER — Other Ambulatory Visit: Payer: Self-pay

## 2021-01-15 MED ORDER — ALPRAZOLAM 0.5 MG PO TABS: 0.5000 mg | ORAL_TABLET | Freq: Two times a day (BID) | ORAL | 0 refills | Status: DC

## 2021-01-15 NOTE — Telephone Encounter (Signed)
  LAST APPOINTMENT DATE: 06/26/20  NEXT APPOINTMENT DATE:@8 /08/2020 TOC with Dr [name]  MEDICATION:ALPRAZolam Duanne Moron) 0.5 MG tablet  PHARMACY: [facility] [address], [place]

## 2021-01-15 NOTE — Telephone Encounter (Signed)
Rx sent to Dr. Parker for approval. 

## 2021-01-15 NOTE — Telephone Encounter (Signed)
Rx was sent in by Dr. [name].

## 2021-01-20 NOTE — Telephone Encounter (Signed)
Rx was sent in on 7/6.

## 2021-02-11 ENCOUNTER — Ambulatory Visit: Payer: Medicare PPO | Admitting: Family Medicine

## 2021-02-11 ENCOUNTER — Other Ambulatory Visit: Payer: Self-pay

## 2021-02-11 ENCOUNTER — Encounter: Payer: Self-pay | Admitting: Family Medicine

## 2021-02-11 VITALS — BP 134/73 | HR 77 | Temp 97.2°F | Ht 61.5 in | Wt 131.2 lb

## 2021-02-11 DIAGNOSIS — F320 Major depressive disorder, single episode, mild: Secondary | ICD-10-CM | POA: Diagnosis not present

## 2021-02-11 DIAGNOSIS — N3941 Urge incontinence: Secondary | ICD-10-CM | POA: Diagnosis not present

## 2021-02-11 DIAGNOSIS — E7800 Pure hypercholesterolemia, unspecified: Secondary | ICD-10-CM

## 2021-02-11 DIAGNOSIS — I2510 Atherosclerotic heart disease of native coronary artery without angina pectoris: Secondary | ICD-10-CM

## 2021-02-11 DIAGNOSIS — E559 Vitamin D deficiency, unspecified: Secondary | ICD-10-CM

## 2021-02-11 DIAGNOSIS — M810 Age-related osteoporosis without current pathological fracture: Secondary | ICD-10-CM

## 2021-02-11 DIAGNOSIS — F419 Anxiety disorder, unspecified: Secondary | ICD-10-CM

## 2021-02-11 DIAGNOSIS — R739 Hyperglycemia, unspecified: Secondary | ICD-10-CM

## 2021-02-11 DIAGNOSIS — N183 Chronic kidney disease, stage 3 unspecified: Secondary | ICD-10-CM | POA: Diagnosis not present

## 2021-02-11 DIAGNOSIS — G250 Essential tremor: Secondary | ICD-10-CM

## 2021-02-11 LAB — COMPREHENSIVE METABOLIC PANEL
ALT: 22 U/L (ref 0–35)
AST: 27 U/L (ref 0–37)
Albumin: 4.4 g/dL (ref 3.5–5.2)
Alkaline Phosphatase: 24 U/L — ABNORMAL LOW (ref 39–117)
BUN: 27 mg/dL — ABNORMAL HIGH (ref 6–23)
CO2: 30 mEq/L (ref 19–32)
Calcium: 9.9 mg/dL (ref 8.4–10.5)
Chloride: 101 mEq/L (ref 96–112)
Creatinine, Ser: 1.21 mg/dL — ABNORMAL HIGH (ref 0.40–1.20)
GFR: 44.53 mL/min — ABNORMAL LOW (ref >60.00)
Glucose, Bld: 91 mg/dL (ref 70–99)
Potassium: 4.3 mEq/L (ref 3.5–5.1)
Sodium: 142 mEq/L (ref 135–145)
Total Bilirubin: 0.6 mg/dL (ref 0.2–1.2)
Total Protein: 6.8 g/dL (ref 6.0–8.3)

## 2021-02-11 LAB — CBC
HCT: 43.3 % (ref 36.0–46.0)
Hemoglobin: 14.4 g/dL (ref 12.0–15.0)
MCHC: 33.3 g/dL (ref 30.0–36.0)
MCV: 91.5 fl (ref 78.0–100.0)
Platelets: 256 10*3/uL (ref 150.0–400.0)
RBC: 4.73 Mil/uL (ref 3.87–5.11)
RDW: 13.1 % (ref 11.5–15.5)
WBC: 5.2 10*3/uL (ref 4.0–10.5)

## 2021-02-11 LAB — URINALYSIS, ROUTINE W REFLEX MICROSCOPIC
Bilirubin Urine: NEGATIVE
Hgb urine dipstick: NEGATIVE
Ketones, ur: NEGATIVE
Leukocytes,Ua: NEGATIVE
Nitrite: NEGATIVE
RBC / HPF: NONE SEEN (ref 0–?)
Specific Gravity, Urine: 1.025 (ref 1.000–1.030)
Total Protein, Urine: NEGATIVE
Urine Glucose: NEGATIVE
Urobilinogen, UA: 0.2 (ref 0.0–1.0)
pH: 5.5 (ref 5.0–8.0)

## 2021-02-11 LAB — LIPID PANEL
Cholesterol: 228 mg/dL — ABNORMAL HIGH (ref 0–200)
HDL: 82.5 mg/dL (ref >39.00)
LDL Cholesterol: 130 mg/dL — ABNORMAL HIGH (ref 0–99)
NonHDL: 145.62
Total CHOL/HDL Ratio: 3
Triglycerides: 76 mg/dL (ref 0.0–149.0)
VLDL: 15.2 mg/dL (ref 0.0–40.0)

## 2021-02-11 LAB — HEMOGLOBIN A1C: Hgb A1c MFr Bld: 5.5 % (ref 4.6–6.5)

## 2021-02-11 LAB — VITAMIN D 25 HYDROXY (VIT D DEFICIENCY, FRACTURES): VITD: 42.65 ng/mL (ref 30.00–100.00)

## 2021-02-11 LAB — TSH: TSH: 1.15 u[IU]/mL (ref 0.35–5.50)

## 2021-02-11 NOTE — Assessment & Plan Note (Signed)
follows with cardiology.  She is on Repatha.  Has not tolerated statins in the past.  We will check lipids today.

## 2021-02-11 NOTE — Patient Instructions (Signed)
It was very nice to see you today!  +We will check blood work and urine sample.  No changes today.  Please let me know if the urinary issues do not improve.  We will get a bone density scan when you get your mammogram later this year.  Take care, Dr [name]  PLEASE NOTE:  If you had any lab tests please let us know if you have not heard back within a few days. You may see your results on mychart before we have a chance to review them but we will give you a call once they are reviewed by [place]. If we ordered any referrals today, please let us know if you have not heard from their office within the next week.   Please try these tips to maintain a healthy lifestyle:  Eat at least 3 REAL meals and 1-2 snacks per day.  Aim for no more than 5 hours between eating.  If you eat breakfast, please do so within one hour of getting up.   Each meal should contain half fruits/vegetables, one quarter protein, and one quarter carbs (no bigger than a computer mouse)  Cut down on sweet beverages. This includes juice, soda, and sweet tea.   Drink at least 1 glass of water with each meal and aim for at least 8 glasses per day  Exercise at least 150 minutes every week.

## 2021-02-11 NOTE — Assessment & Plan Note (Signed)
Follows with cardiology.  On Repatha.  Check labs today.

## 2021-02-11 NOTE — Assessment & Plan Note (Signed)
On Prozac 40 mg daily.  Occasionally takes 60 mg daily as needed during times of high stress.  She also uses Xanax as needed.  Current regimen works well for her.  We will continue.

## 2021-02-11 NOTE — Assessment & Plan Note (Signed)
Check c-Met. 

## 2021-02-11 NOTE — Assessment & Plan Note (Signed)
On Prolia every 6 months.  Follows with endocrinology.  Due for bone density scan later this year.  Will place order today.

## 2021-02-11 NOTE — Assessment & Plan Note (Signed)
Check UA and urine culture.  Discussed starting medication versus referral to urology however patient deferred for now.  We will continue with watchful waiting.  May consider trial of Myrbetriq or oxybutynin if symptoms worsen.

## 2021-02-11 NOTE — Progress Notes (Signed)
[name] is a 75 y.o. female who presents today for an office visit.  Assessment/Plan:  Chronic Problems Addressed Today: Depression On Prozac 40 mg daily.  She will occasionally take 60 mg daily during times of high stress.  She is doing well with current dose.  Hyperlipidemia follows with cardiology.  She is on Repatha.  Has not tolerated statins in the past.  We will check lipids today.  Anxiety On Prozac 40 mg daily.  Occasionally takes 60 mg daily as needed during times of high stress.  She also uses Xanax as needed.  Current regimen works well for her.  We will continue.  CAD (coronary artery disease), native coronary artery Follows with cardiology.  On Repatha.  Check labs today.  Urge incontinence Check UA and urine culture.  Discussed starting medication versus referral to urology however patient deferred for now.  We will continue with watchful waiting.  May consider trial of Myrbetriq or oxybutynin if symptoms worsen.  Vitamin D deficiency, unspecified Check vitamin D level.  Stage 3 chronic kidney disease Check c-Met.  Essential tremor Was on primidone in the past but is no longer taking due to side effects.  Overall still has a mild tremor but symptoms are manageable.  Seen neurology in the past for this.  Osteoporosis On Prolia every 6 months.  Follows with endocrinology.  Due for bone density scan later this year.  Will place order today.  Preventative Healthcare Check labs today.  Will be getting mammogram and bone density scan later this year.    Subjective:  HPI: She is here to transfer of care. Previous PCP left and no longer works at this office.   She is interesting to complete her blood work today. She have a Hx of HTN and use Rephatha Sureclick .She also have a PMHx anxiety and depression, CAD, essential tremor, osteoporosis, stage 3 CKD, and vitamin D deficiency.  She is currently taking Prozac and have been doing well with the medication.  She is taking Xanax 2 times a day and sometimes at night. No side effects with the medication. She walk and doing better.  Previously she has taken the prolia injection for her osteoporosis. In addition to this, she complains of back pain.   She states she have hard time walking to a bathroom after waking up. This problem started few months ago and symptoms have been getting worse since then. She describes pain as stiff in the morning when awakening.  Occasionally has urinary incontinence in the morning.  Feels a sudden urge to urinate but could not make it to the bathroom.  No fever, SOB, chills or nausea. Denies UTI.           Objective:  Physical Exam: BP 134/73   Pulse 77   Temp (!) 97.2 F (36.2 C) (Temporal)   Ht 5' 1.5" (1.562 m)   Wt 131 lb 3.2 oz (59.5 kg)   SpO2 97%   BMI 24.39 kg/m   Gen: No acute distress, resting comfortably CV: Regular rate and rhythm with no murmurs appreciated Pulm: Normal work of breathing, clear to auscultation bilaterally with no crackles, wheezes, or rhonchi Neuro: Grossly normal, moves all extremities Psych: Normal affect and thought content       I,Savera Zaman,acting as a scribe for Dimas Chyle, MD.,have documented all relevant documentation on the behalf of Dimas Chyle, MD,as directed by  Dimas Chyle, MD while in the presence of Dimas Chyle, MD.   I, [place]  [name], MD, have reviewed all documentation for this visit. The documentation on 02/11/21 for the exam, diagnosis, procedures, and orders are all accurate and complete.   Time Spent: 45 minutes of total time was spent on the date of the encounter performing the following actions: chart review prior to seeing the patient including visits with previous PCP, obtaining history, performing a medically necessary exam, counseling on the treatment plan, placing orders, and documenting in our EHR.   Algis Greenhouse. [name], MD 02/11/2021 11:27 AM

## 2021-02-11 NOTE — Assessment & Plan Note (Signed)
Check vitamin D level 

## 2021-02-11 NOTE — Assessment & Plan Note (Signed)
Was on primidone in the past but is no longer taking due to side effects.  Overall still has a mild tremor but symptoms are manageable.  Seen neurology in the past for this.

## 2021-02-11 NOTE — Assessment & Plan Note (Signed)
On Prozac 40 mg daily.  She will occasionally take 60 mg daily during times of high stress.  She is doing well with current dose.

## 2021-02-12 LAB — URINE CULTURE
MICRO NUMBER:: 12191144
Result:: NO GROWTH
SPECIMEN QUALITY:: ADEQUATE

## 2021-02-12 NOTE — Progress Notes (Signed)
Please inform patient of the following:  Cholesterol levels are borderline.  Her kidney numbers are also borderline but stable.  Everything else is normal.  Do not need to make any medication changes at this point.we can recheck everything in a year.

## 2021-02-14 ENCOUNTER — Encounter: Payer: Self-pay | Admitting: Family Medicine

## 2021-02-17 NOTE — Progress Notes (Signed)
Please inform patient of the following:  Her urine culture is negative.  She does not have a UTI.

## 2021-03-03 ENCOUNTER — Other Ambulatory Visit: Payer: Self-pay | Admitting: Family Medicine

## 2021-04-09 ENCOUNTER — Other Ambulatory Visit: Payer: Self-pay | Admitting: Family Medicine

## 2021-04-19 NOTE — Telephone Encounter (Signed)
Prolia VOB initiated via [email or web address]  Last OV:  Next OV:  Last Prolia inj: 12/04/20 Next Prolia inj DUE: 06/07/21

## 2021-04-21 ENCOUNTER — Other Ambulatory Visit: Payer: Self-pay | Admitting: Family Medicine

## 2021-04-21 MED ORDER — TRAZODONE HCL 50 MG PO TABS: ORAL_TABLET | ORAL | 1 refills | Status: DC

## 2021-04-21 NOTE — Telephone Encounter (Signed)
Last refill by Dr Rogers Blocker

## 2021-04-21 NOTE — Addendum Note (Signed)
Addended by: Betti Cruz on: 04/21/2021 02:59 PM   Modules accepted: Orders

## 2021-04-23 NOTE — Telephone Encounter (Signed)
Pt ready for scheduling on or after 06/07/21  Out-of-pocket cost due at time of visit: $0.00  Primary: [employer] Prolia co-insurance: 0% Admin fee co-insurance: 0%  Secondary:  Prolia co-insurance:  Admin fee co-insurance:   Deductible: does not apply  PA APPROVED PA# [id number] Valid 11/06/19-07/12/21    ** This summary of benefits is an estimation of the patient's out-of-pocket cost. Exact cost may very based on individual plan coverage.

## 2021-06-11 ENCOUNTER — Other Ambulatory Visit: Payer: Self-pay

## 2021-06-11 ENCOUNTER — Ambulatory Visit: Payer: Medicare PPO

## 2021-06-17 ENCOUNTER — Ambulatory Visit (INDEPENDENT_AMBULATORY_CARE_PROVIDER_SITE_OTHER): Payer: Medicare PPO

## 2021-06-17 ENCOUNTER — Other Ambulatory Visit: Payer: Self-pay

## 2021-06-17 DIAGNOSIS — M810 Age-related osteoporosis without current pathological fracture: Secondary | ICD-10-CM

## 2021-06-17 MED ORDER — DENOSUMAB 60 MG/ML ~~LOC~~ SOSY
60.0000 mg | PREFILLED_SYRINGE | Freq: Once | SUBCUTANEOUS | Status: AC
  Administered 2021-06-17: 60 mg via SUBCUTANEOUS

## 2021-06-17 NOTE — Progress Notes (Signed)
Prolia injection admiistered to pt's left arm. Pt tolerated well.

## 2021-06-19 ENCOUNTER — Ambulatory Visit: Payer: Medicare PPO | Admitting: Neurology

## 2021-06-20 ENCOUNTER — Ambulatory Visit: Payer: Medicare PPO

## 2021-06-22 NOTE — Telephone Encounter (Signed)
Last Prolia inj 06/17/21 Next Prolia inj due 12/17/21

## 2021-06-27 ENCOUNTER — Telehealth: Payer: Self-pay | Admitting: Family Medicine

## 2021-06-27 NOTE — Telephone Encounter (Signed)
Patient called because she needs prescription for FLUoxetine (PROZAC) 40 MG capsule to be prescribed and sent in by Dr.Parker. Patient is taking it twice a day and states it is urgent because pharmacy has been sending request for old prescribing provider.     Please send new prescription to  [facility], [place] [address] Phone:  [phone]  Fax:  [phone]            Please advise

## 2021-06-27 NOTE — Telephone Encounter (Signed)
Please see note below. 

## 2021-06-29 ENCOUNTER — Other Ambulatory Visit: Payer: Self-pay | Admitting: Family Medicine

## 2021-06-30 MED ORDER — FLUOXETINE HCL 40 MG PO CAPS: 40.0000 mg | ORAL_CAPSULE | Freq: Every day | ORAL | 1 refills | Status: DC

## 2021-07-23 ENCOUNTER — Ambulatory Visit: Payer: Medicare PPO | Admitting: Internal Medicine

## 2021-07-24 ENCOUNTER — Telehealth: Payer: Self-pay

## 2021-07-24 NOTE — Telephone Encounter (Signed)
New message   Benefit Verification [id number] Submitted! For BV Basic submissions, please allow 3-5 business days for results.  For BV Full submissions, please allow 7 business days for results.

## 2021-07-24 NOTE — Telephone Encounter (Signed)
New message   Received fax from [facility]   Authorization Number [id number]  Authorization 07/13/2021 to  07/12/2022

## 2021-08-03 ENCOUNTER — Other Ambulatory Visit: Payer: Self-pay | Admitting: Cardiovascular Disease

## 2021-08-04 ENCOUNTER — Ambulatory Visit: Payer: Medicare PPO

## 2021-08-05 ENCOUNTER — Ambulatory Visit (INDEPENDENT_AMBULATORY_CARE_PROVIDER_SITE_OTHER): Payer: Medicare PPO

## 2021-08-05 ENCOUNTER — Other Ambulatory Visit: Payer: Self-pay

## 2021-08-05 ENCOUNTER — Telehealth: Payer: Self-pay

## 2021-08-05 DIAGNOSIS — Z0000 Encounter for general adult medical examination without abnormal findings: Secondary | ICD-10-CM

## 2021-08-05 DIAGNOSIS — Z1231 Encounter for screening mammogram for malignant neoplasm of breast: Secondary | ICD-10-CM | POA: Diagnosis not present

## 2021-08-05 NOTE — Telephone Encounter (Signed)
New message   [employer] (onabotulinumtoxinA)  ACQUISITION - [place] List of Specialty Pharmacies may not include all available options. If preferred Specialty Pharmacy is not listed, check with payer.  $Remov'[ ]'PACZCe$  Buy and Bill [?]Buy and [place] Available $RemoveBeforeDEI'[ ]'CAZAGqJQJGqjTEMX$  Specialty Pharmacy Required $RemoveBeforeDEI'[ ]'JKTRAYlEauXEOSRT$  Prescription Coverage Only - See Pharmacy Benefits Section $RemoveBeforeDE'[ ]'csocoQdhGvBhYxd$  Payer will not release information  Specialty Pharmacies: [facility] [phone]   COVERAGE DETAILS - MAJOR MEDICAL BENEFITS The plan's renewal date is 07/13/2022. This plan runs on a calendar year basis. Once the medical individual out of pocket maximum has been met, the patient responsibility will be 0%. The payer advised the medical out-of-pocket max does not apply to J-Code [id number]. Under medical, the above referenced payer is unable to confirm the maximum number of units allowed under the medical benefits. Please contact the payer by calling [phone]. For additional payer coverage criteria, please reference the [employer] Policies and Forms link available at [email or web address].

## 2021-08-05 NOTE — Progress Notes (Addendum)
Virtual Visit via Telephone Note  I connected with  [name] on 08/05/21 at 10:15 AM EST by telephone and verified that I am speaking with the correct person using two identifiers.  Medicare Annual Wellness visit completed telephonically due to Covid-19 pandemic.   Persons participating in this call: This Health Coach and this patient.   Location: Patient: home Provider: office   I discussed the limitations, risks, security and privacy concerns of performing an evaluation and management service by telephone and the availability of in person appointments. The patient expressed understanding and agreed to proceed.  Unable to perform video visit due to video visit attempted and failed and/or patient does not have video capability.   Some vital signs may be absent or patient reported.   Willette Brace, LPN   Subjective:   [name] is a 76 y.o. female who presents for an Initial Medicare Annual Wellness Visit.  Review of Systems     Cardiac Risk Factors include: advanced age (>79men, >59 women);dyslipidemia     Objective:    There were no vitals filed for this visit. There is no height or weight on file to calculate BMI.  Advanced Directives 08/05/2021 03/15/2020 03/08/2019 11/19/2017 07/28/2017 07/27/2017 04/16/2017  Does Patient Have a Medical Advance Directive? Yes Yes Yes Yes Yes No No  Type of Paramedic of [place];Living will Living will;Healthcare Power of [place];Living will [place];Living will - -  Does patient want to make changes to medical advance directive? - - - No - Patient declined No - Patient declined - -  Copy of [place] in Chart? No - copy requested - - No - copy requested No - copy requested - -  Would patient like information on creating a medical advance directive? - - - - No - Patient declined No - Patient declined -     Current Medications (verified) Outpatient Encounter Medications as of 08/05/2021  Medication Sig   ALPRAZolam (XANAX) 0.5 MG tablet Take 1 tablet by mouth twice daily   FLUoxetine (PROZAC) 20 MG capsule Take 1 capsule (20 mg total) by mouth daily.   FLUoxetine (PROZAC) 40 MG capsule Take 1 capsule (40 mg total) by mouth daily.   traZODone (DESYREL) 50 MG tablet TAKE 1 TABLET BY MOUTH AT BEDTIME (QUALITEST BRAND ONLY: TEVA)   REPATHA SURECLICK 595 MG/ML SOAJ INJECT 1 PEN INTO THE SKIN EVERY 14 DAYS (Patient not taking: Reported on 08/05/2021)   tetrahydrozoline 0.05 % ophthalmic solution 1 drop into affected eye as needed (Patient not taking: Reported on 08/05/2021)   triamcinolone cream (KENALOG) 0.1 % Apply 1 application topically 2 (two) times daily.  (Patient not taking: Reported on 08/05/2021)   [DISCONTINUED] fluocinonide cream (LIDEX) 6.38 % Apply 1 application topically 2 (two) times daily.   [DISCONTINUED] REPATHA SURECLICK 756 MG/ML SOAJ INJECT 1 PEN INTO THE SKIN EVERY 14 DAYS   No facility-administered encounter medications on file as of 08/05/2021.    Allergies (verified) Shellfish-derived products, Lipitor [atorvastatin], Crestor [rosuvastatin calcium], Evolocumab, Metoprolol, Pravastatin, Prednisone, Zetia [ezetimibe], and Codeine   History: Past Medical History:  Diagnosis Date   Anxiety    Arthritis    "back, neck" (03/08/2017)   Asthma in child    Bowel obstruction ([place])    CAD (coronary artery disease), native coronary artery    Chronic lower back pain    Chronic neck pain    Closed  left tibial fracture    Colon polyps    Complication of anesthesia    "I was awake during one of my surgeries" reports that she felt them start the incision and heard them talking reports happened ~ 35 years ago     Depression    Diverticulosis    Hyperlipidemia    Irritable bowel syndrome    Migraine    "used to be severe; now maybe 1/year or 2" (03/08/2017)   Pneumonia    H!N!    Past Surgical History:  Procedure Laterality Date   ANKLE HARDWARE REMOVAL Left 07/27/2017   Hardware removal left ankle, takedown non-union with allograft bone graft, Intramedullary Nail (Left)   APPENDECTOMY     AUGMENTATION MAMMAPLASTY Bilateral    BRACHIOPLASTY Bilateral    Arm Lift    CARDIAC CATHETERIZATION     CHOLECYSTECTOMY OPEN     COLON SURGERY     EXTERNAL FIXATION LEG Left 03/08/2017   Procedure: INTERNAL FIXATION LEFT ANKLE;  Surgeon: Newt Minion, MD;  Location: [facility];  Service: Orthopedics;  Laterality: Left;   EYE MUSCLE SURGERY Bilateral X 2   "lazy eye; OR twice on each eye; Dr. Annamaria Boots"   [place] Left 07/27/2017   Procedure: HARDWARE REMOVAL;  Surgeon: Mcarthur Rossetti, MD;  Location: [facility];  Service: Orthopedics;  Laterality: Left;   LEFT HEART CATH AND CORONARY ANGIOGRAPHY N/A 10/27/2016   Procedure: Left Heart Cath and Coronary Angiography;  Surgeon: Peter M Martinique, MD;  Location: [facility];  Service: Cardiovascular;  Laterality: N/A;   LEFT HEART CATHETERIZATION WITH CORONARY ANGIOGRAM N/A 08/16/2014   Procedure: LEFT HEART CATHETERIZATION WITH CORONARY ANGIOGRAM;  Surgeon: Sinclair Grooms, MD;  Location[facility];  Service: Cardiovascular;  Laterality: N/A;   NASAL SINUS SURGERY     ORIF ANKLE FRACTURE Left 03/08/2017   ORIF ANKLE FRACTURE Left 07/27/2017   Procedure: Hardware removal left ankle, takedown non-union with allograft bone graft, Intramedullary Nail;  Surgeon: Mcarthur Rossetti, MD;  Location: [facility];  Service: Orthopedics;  Laterality: Left;   PARTIAL COLECTOMY  1970s   TONSILLECTOMY     Family History  Problem Relation Age of Onset   Heart disease Mother        CHF   Coronary artery disease Mother    Hypertension Mother    Hyperlipidemia Mother    Heart disease Father        CHF   Coronary artery disease Father    Diabetes Sister        with all the complications   Coronary artery  disease Sister    Stroke Sister 23       CVA   Peripheral vascular disease Sister    Colon cancer Maternal Grandfather    Healthy Son    Liver disease Son        liver failure    Cancer Neg Hx        breast or colon   Social History   Socioeconomic History   Marital status: Married    Spouse name: Not on file   Number of children: 2   Years of education: Not on file   Highest education level: Bachelor's degree (e.g., BA, AB, BS)  Occupational History   Occupation: Retired Merchant navy officer)     Employer: RETIRED  Tobacco Use   Smoking status: Former    Packs/day: 1.00    Years: 41.00  Pack years: 41.00    Types: Cigarettes    Quit date: 01/10/2010    Years since quitting: 11.5   Smokeless tobacco: Never  Vaping Use   Vaping Use: Former  Substance and Sexual Activity   Alcohol use: No   Drug use: No   Sexual activity: Not Currently    Partners: Female  Other Topics Concern   Not on file  Social History Narrative   [place]   Married '72-16 yrs/divorce; married '92   1 grandchild   Niece (jamie-Madeline's daughter, with 3 children and full time Ship broker)   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   Marriage in good health.      Physician roster   Gyn- Helene Shoe, NP   Opthal- Dr Annamaria Boots (pediatric opthal)   GI- dr Adriana Simas- Dr Gladstone Lighter   ENT- Dr Ernesto Rutherford            Social Determinants of Health   Financial Resource Strain: Low Risk    Difficulty of Paying Living Expenses: Not hard at all  Food Insecurity: No Food Insecurity   Worried About [place] in the Last Year: Never true   [place] in the Last Year: Never true  Transportation Needs: No Transportation Needs   Lack of Transportation (Medical): No   Lack of Transportation (Non-Medical): No  Physical Activity: Sufficiently Active   Days of Exercise per Week: 5 days   Minutes of Exercise per Session: 50 min  Stress: No Stress Concern Present   Feeling of Stress : Only a little   Social Connections: Moderately Isolated   Frequency of Communication with Friends and Family: More than three times a week   Frequency of Social Gatherings with Friends and Family: More than three times a week   Attends Religious Services: Never   Marine scientist or Organizations: No   Attends Music therapist: Never   Marital Status: Married    Tobacco Counseling Counseling given: Not Answered   Clinical Intake:  Pre-visit preparation completed: Yes  Pain : No/denies pain     BMI - recorded: 24.39 Nutritional Status: BMI of 19-24  Normal Nutritional Risks: None Diabetes: No  How often do you need to have someone help you when you read instructions, pamphlets, or other written materials from your doctor or pharmacy?: 1 - Never  Diabetic?No  Interpreter Needed?: No  Information entered by :: Charlott Rakes, LPn   Activities of Daily Living In your present state of health, do you have any difficulty performing the following activities: 08/05/2021  Hearing? N  Vision? N  Difficulty concentrating or making decisions? N  Walking or climbing stairs? N  Dressing or bathing? N  Doing errands, shopping? N  Preparing Food and eating ? N  Using the Toilet? N  In the past six months, have you accidently leaked urine? N  Do you have problems with loss of bowel control? N  Managing your Medications? N  Managing your Finances? N  Housekeeping or managing your Housekeeping? N  Some recent data might be hidden    Patient Care Team: Vivi Barrack, MD as PCP - General (Family Medicine) Nahser, Wonda Cheng, MD as PCP - Cardiology (Cardiology)  Indicate any recent Medical Services you may have received from other than [facility] providers in the past year (date may be approximate).     Assessment:   This is a routine wellness examination for [place].  Hearing/Vision screen  Hearing Screening - Comments:: Pt denies nay hearing issues  Vision Screening -  Comments:: Pt follows up with Dr Valma Cava for annual eye exams   Dietary issues and exercise activities discussed: Current Exercise Habits: Home exercise routine, Type of exercise: walking, Time (Minutes): 45, Frequency (Times/Week): 5, Weekly Exercise (Minutes/Week): 225   Goals Addressed             This Visit's Progress    Patient Stated       None at this Time       Depression Screen PHQ 2/9 Scores 08/05/2021 02/11/2021 06/26/2020  PHQ - 2 Score 0 0 0  PHQ- 9 Score - 0 -    Fall Risk Fall Risk  08/05/2021 02/11/2021 06/26/2020 03/15/2020 03/08/2019  Falls in the past year? - 0 0 0 0  Number falls in past yr: 0 0 - 0 0  Injury with Fall? 0 0 - 0 0  Risk for fall due to : - No Fall Risks No Fall Risks - -  Follow up Falls prevention discussed - - - -    FALL RISK PREVENTION PERTAINING TO THE HOME:  Any stairs in or around the home? Yes  If so, are there any without handrails? No  Home free of loose throw rugs in walkways, pet beds, electrical cords, etc? Yes  Adequate lighting in your home to reduce risk of falls? Yes   ASSISTIVE DEVICES UTILIZED TO PREVENT FALLS:  Life alert? No  Use of a cane, walker or w/c? No  Grab bars in the bathroom? Yes  Shower chair or bench in shower? No  Elevated toilet seat or a handicapped toilet? No   TIMED UP AND GO:  Was the test performed? No .  Cognitive Function:     6CIT Screen 08/05/2021  What Year? 0 points  What month? 0 points  What time? 0 points  Count back from 20 0 points  Months in reverse 0 points  Repeat phrase 4 points  Total Score 4    Immunizations Immunization History  Administered Date(s) Administered   Influenza Split 06/04/2009, 03/23/2014, 04/19/2017   Influenza Whole 04/12/2009, 04/24/2010   Influenza, High Dose Seasonal PF 05/15/2016, 04/19/2017, 04/04/2018, 03/27/2019, 05/27/2020   Influenza-Unspecified 03/13/2014, 05/29/2015, 05/15/2016, 03/11/2017, 04/04/2018, 03/27/2019   Moderna Sars-Covid-2  Vaccination 08/18/2019, 09/20/2019   Pneumococcal Conjugate-13 09/29/2013   Pneumococcal Polysaccharide-23 11/23/2007, 03/10/2019   Td 08/22/2012   Tdap 09/29/2013   Tetanus 08/22/2012   Zoster Recombinat (Shingrix) 10/24/2019, 02/20/2020   Zoster, Live 10/24/2019, 02/20/2020    TDAP status: Up to date  Flu Vaccine status: Due, Education has been provided regarding the importance of this vaccine. Advised may receive this vaccine at local pharmacy or Health Dept. Aware to provide a copy of the vaccination record if obtained from local pharmacy or Health Dept. Verbalized acceptance and understanding.  Pneumococcal vaccine status: Up to date  Covid-19 vaccine status: Completed vaccines  Qualifies for Shingles Vaccine? Yes   Zostavax completed Yes   Shingrix Completed?: Yes  Screening Tests Health Maintenance  Topic Date Due   Hepatitis C Screening  Never done   INFLUENZA VACCINE  02/10/2021   MAMMOGRAM  04/03/2021   COLONOSCOPY (Pts 45-63yrs Insurance coverage will need to be confirmed)  10/27/2022   TETANUS/TDAP  09/30/2023   Pneumonia Vaccine 17+ Years old  Completed   DEXA SCAN  Completed   Zoster Vaccines- Shingrix  Completed   HPV VACCINES  Aged Out   COVID-19 Vaccine  Discontinued  Health Maintenance  Health Maintenance Due  Topic Date Due   Hepatitis C Screening  Never done   INFLUENZA VACCINE  02/10/2021   MAMMOGRAM  04/03/2021    Colorectal cancer screening: Type of screening: Colonoscopy. Completed 10/26/12. Repeat every 10 years  Mammogram status: Completed 04/04/19. Repeat every year  Bone Density status: Completed 05/19/19. Results reflect: Bone density results: OSTEOPOROSIS. Repeat every 2 years.  Additional Screening:  Hepatitis C Screening: does qualify;  Vision Screening: Recommended annual ophthalmology exams for early detection of glaucoma and other disorders of the eye. Is the patient up to date with their annual eye exam?  Yes  Who is the  provider or what is the name of the office in which the patient attends annual eye exams? Dr Valma Cava  If pt is not established with a provider, would they like to be referred to a provider to establish care? No .   Dental Screening: Recommended annual dental exams for proper oral hygiene  Community Resource Referral / Chronic Care Management: CRR required this visit?  No   CCM required this visit?  No      Plan:     I have personally reviewed and noted the following in the patients chart:   Medical and social history Use of alcohol, tobacco or illicit drugs  Current medications and supplements including opioid prescriptions. Patient is not currently taking opioid prescriptions. Functional ability and status Nutritional status Physical activity Advanced directives List of other physicians Hospitalizations, surgeries, and ER visits in previous 12 months Vitals Screenings to include cognitive, depression, and falls Referrals and appointments  In addition, I have reviewed and discussed with patient certain preventive protocols, quality metrics, and best practice recommendations. A written personalized care plan for preventive services as well as general preventive health recommendations were provided to patient.     Willette Brace, LPN   3/50/0938   Nurse Notes: none

## 2021-08-05 NOTE — Patient Instructions (Signed)
[name] , Thank you for taking time to come for your Medicare Wellness Visit. I appreciate your ongoing commitment to your health goals. Please review the following plan we discussed and let me know if I can assist you in the future.   Screening recommendations/referrals: Colonoscopy: Done 10/26/12 repeat  every  Mammogram: Done 04/04/19 repeat every year  Bone Density: Done 05/19/19 repeat every 2 years Recommended yearly ophthalmology/optometry visit for glaucoma screening and checkup Recommended yearly dental visit for hygiene and checkup  Vaccinations: Influenza vaccine: Due and discussed  Pneumococcal vaccine: Up to date Tdap vaccine: Done 09/29/13 repeat every 10 years Shingles vaccine: Completed 2/5 & 09/20/19   Covid-19:Completed 2/5 & 09/20/19  Advanced directives: Please bring a copy of your health care power of attorney and living will to the office at your convenience.  Conditions/risks identified: None at this time   Next appointment: Follow up in one year for your annual wellness visit    Preventive Care 74 Years and Older, Female Preventive care refers to lifestyle choices and visits with your health care provider that can promote health and wellness. What does preventive care include? A yearly physical exam. This is also called an annual well check. Dental exams once or twice a year. Routine eye exams. Ask your health care provider how often you should have your eyes checked. Personal lifestyle choices, including: Daily care of your teeth and gums. Regular physical activity. Eating a healthy diet. Avoiding tobacco and drug use. Limiting alcohol use. Practicing safe sex. Taking low-dose aspirin every day. Taking vitamin and mineral supplements as recommended by your health care provider. What happens during an annual well check? The services and screenings done by your health care provider during your annual well check will depend on your age, overall health,  lifestyle risk factors, and family history of disease. Counseling  Your health care provider may ask you questions about your: Alcohol use. Tobacco use. Drug use. Emotional well-being. Home and relationship well-being. Sexual activity. Eating habits. History of falls. Memory and ability to understand (cognition). Work and work Statistician. Reproductive health. Screening  You may have the following tests or measurements: Height, weight, and BMI. Blood pressure. Lipid and cholesterol levels. These may be checked every 5 years, or more frequently if you are over 74 years old. Skin check. Lung cancer screening. You may have this screening every year starting at age 21 if you have a 30-pack-year history of smoking and currently smoke or have quit within the past 15 years. Fecal occult blood test (FOBT) of the stool. You may have this test every year starting at age 34. Flexible sigmoidoscopy or colonoscopy. You may have a sigmoidoscopy every 5 years or a colonoscopy every 10 years starting at age 74. Hepatitis C blood test. Hepatitis B blood test. Sexually transmitted disease (STD) testing. Diabetes screening. This is done by checking your blood sugar (glucose) after you have not eaten for a while (fasting). You may have this done every 1-3 years. Bone density scan. This is done to screen for osteoporosis. You may have this done starting at age 27. Mammogram. This may be done every 1-2 years. Talk to your health care provider about how often you should have regular mammograms. Talk with your health care provider about your test results, treatment options, and if necessary, the need for more tests. Vaccines  Your health care provider may recommend certain vaccines, such as: Influenza vaccine. This is recommended every year. Tetanus, diphtheria, and acellular pertussis (Tdap, Td)  vaccine. You may need a Td booster every 10 years. Zoster vaccine. You may need this after age  59. Pneumococcal 13-valent conjugate (PCV13) vaccine. One dose is recommended after age 35. Pneumococcal polysaccharide (PPSV23) vaccine. One dose is recommended after age 18. Talk to your health care provider about which screenings and vaccines you need and how often you need them. This information is not intended to replace advice given to you by your health care provider. Make sure you discuss any questions you have with your health care provider. Document Released: 07/26/2015 Document Revised: 03/18/2016 Document Reviewed: 04/30/2015 Elsevier Interactive Patient Education  2017 [place] Prevention in the Home Falls can cause injuries. They can happen to people of all ages. There are many things you can do to make your home safe and to help prevent falls. What can I do on the outside of my home? Regularly fix the edges of walkways and driveways and fix any cracks. Remove anything that might make you trip as you walk through a door, such as a raised step or threshold. Trim any bushes or trees on the path to your home. Use bright outdoor lighting. Clear any walking paths of anything that might make someone trip, such as rocks or tools. Regularly check to see if handrails are loose or broken. Make sure that both sides of any steps have handrails. Any raised decks and porches should have guardrails on the edges. Have any leaves, snow, or ice cleared regularly. Use sand or salt on walking paths during winter. Clean up any spills in your garage right away. This includes oil or grease spills. What can I do in the bathroom? Use night lights. Install grab bars by the toilet and in the tub and shower. Do not use towel bars as grab bars. Use non-skid mats or decals in the tub or shower. If you need to sit down in the shower, use a plastic, non-slip stool. Keep the floor dry. Clean up any water that spills on the floor as soon as it happens. Remove soap buildup in the tub or shower  regularly. Attach bath mats securely with double-sided non-slip rug tape. Do not have throw rugs and other things on the floor that can make you trip. What can I do in the bedroom? Use night lights. Make sure that you have a light by your bed that is easy to reach. Do not use any sheets or blankets that are too big for your bed. They should not hang down onto the floor. Have a firm chair that has side arms. You can use this for support while you get dressed. Do not have throw rugs and other things on the floor that can make you trip. What can I do in the kitchen? Clean up any spills right away. Avoid walking on wet floors. Keep items that you use a lot in easy-to-reach places. If you need to reach something above you, use a strong step stool that has a grab bar. Keep electrical cords out of the way. Do not use floor polish or wax that makes floors slippery. If you must use wax, use non-skid floor wax. Do not have throw rugs and other things on the floor that can make you trip. What can I do with my stairs? Do not leave any items on the stairs. Make sure that there are handrails on both sides of the stairs and use them. Fix handrails that are broken or loose. Make sure that handrails are as long  as the stairways. Check any carpeting to make sure that it is firmly attached to the stairs. Fix any carpet that is loose or worn. Avoid having throw rugs at the top or bottom of the stairs. If you do have throw rugs, attach them to the floor with carpet tape. Make sure that you have a light switch at the top of the stairs and the bottom of the stairs. If you do not have them, ask someone to add them for you. What else can I do to help prevent falls? Wear shoes that: Do not have high heels. Have rubber bottoms. Are comfortable and fit you well. Are closed at the toe. Do not wear sandals. If you use a stepladder: Make sure that it is fully opened. Do not climb a closed stepladder. Make sure that  both sides of the stepladder are locked into place. Ask someone to hold it for you, if possible. Clearly mark and make sure that you can see: Any grab bars or handrails. First and last steps. Where the edge of each step is. Use tools that help you move around (mobility aids) if they are needed. These include: Canes. Walkers. Scooters. Crutches. Turn on the lights when you go into a dark area. Replace any light bulbs as soon as they burn out. Set up your furniture so you have a clear path. Avoid moving your furniture around. If any of your floors are uneven, fix them. If there are any pets around you, be aware of where they are. Review your medicines with your doctor. Some medicines can make you feel dizzy. This can increase your chance of falling. Ask your doctor what other things that you can do to help prevent falls. This information is not intended to replace advice given to you by your health care provider. Make sure you discuss any questions you have with your health care provider. Document Released: 04/25/2009 Document Revised: 12/05/2015 Document Reviewed: 08/03/2014 Elsevier Interactive Patient Education  2017 [employer].

## 2021-08-07 ENCOUNTER — Other Ambulatory Visit: Payer: Self-pay | Admitting: Family Medicine

## 2021-08-07 ENCOUNTER — Telehealth: Payer: Self-pay

## 2021-08-07 ENCOUNTER — Other Ambulatory Visit: Payer: Self-pay | Admitting: *Deleted

## 2021-08-07 DIAGNOSIS — Z1231 Encounter for screening mammogram for malignant neoplasm of breast: Secondary | ICD-10-CM

## 2021-08-07 DIAGNOSIS — N644 Mastodynia: Secondary | ICD-10-CM

## 2021-08-07 NOTE — Telephone Encounter (Signed)
FYI:  [facility] has sent over a request for a cosign on ultrasound to go along with diagnostic mammogram for pain in both breast w/ implants.

## 2021-08-19 DIAGNOSIS — N644 Mastodynia: Secondary | ICD-10-CM | POA: Diagnosis not present

## 2021-08-19 LAB — HM MAMMOGRAPHY

## 2021-08-21 ENCOUNTER — Encounter: Payer: Self-pay | Admitting: Family Medicine

## 2021-08-27 ENCOUNTER — Encounter: Payer: Self-pay | Admitting: Family Medicine

## 2021-08-27 ENCOUNTER — Other Ambulatory Visit: Payer: Self-pay

## 2021-08-27 ENCOUNTER — Ambulatory Visit (INDEPENDENT_AMBULATORY_CARE_PROVIDER_SITE_OTHER): Payer: Medicare PPO | Admitting: Family Medicine

## 2021-08-27 VITALS — BP 114/69 | HR 75 | Temp 97.3°F | Ht 60.83 in | Wt 131.2 lb

## 2021-08-27 DIAGNOSIS — I2510 Atherosclerotic heart disease of native coronary artery without angina pectoris: Secondary | ICD-10-CM

## 2021-08-27 DIAGNOSIS — F320 Major depressive disorder, single episode, mild: Secondary | ICD-10-CM

## 2021-08-27 DIAGNOSIS — E559 Vitamin D deficiency, unspecified: Secondary | ICD-10-CM

## 2021-08-27 DIAGNOSIS — Z0001 Encounter for general adult medical examination with abnormal findings: Secondary | ICD-10-CM | POA: Diagnosis not present

## 2021-08-27 DIAGNOSIS — Z79899 Other long term (current) drug therapy: Secondary | ICD-10-CM | POA: Diagnosis not present

## 2021-08-27 DIAGNOSIS — N183 Chronic kidney disease, stage 3 unspecified: Secondary | ICD-10-CM

## 2021-08-27 DIAGNOSIS — E7800 Pure hypercholesterolemia, unspecified: Secondary | ICD-10-CM | POA: Diagnosis not present

## 2021-08-27 DIAGNOSIS — Z1159 Encounter for screening for other viral diseases: Secondary | ICD-10-CM | POA: Diagnosis not present

## 2021-08-27 DIAGNOSIS — F419 Anxiety disorder, unspecified: Secondary | ICD-10-CM

## 2021-08-27 DIAGNOSIS — R739 Hyperglycemia, unspecified: Secondary | ICD-10-CM | POA: Diagnosis not present

## 2021-08-27 LAB — VITAMIN B12: Vitamin B-12: 837 pg/mL (ref 211–911)

## 2021-08-27 LAB — TSH: TSH: 1.56 u[IU]/mL (ref 0.35–5.50)

## 2021-08-27 LAB — CBC
HCT: 42.4 % (ref 36.0–46.0)
Hemoglobin: 14.1 g/dL (ref 12.0–15.0)
MCHC: 33.2 g/dL (ref 30.0–36.0)
MCV: 92.6 fl (ref 78.0–100.0)
Platelets: 245 10*3/uL (ref 150.0–400.0)
RBC: 4.58 Mil/uL (ref 3.87–5.11)
RDW: 12.3 % (ref 11.5–15.5)
WBC: 5.1 10*3/uL (ref 4.0–10.5)

## 2021-08-27 LAB — HEMOGLOBIN A1C: Hgb A1c MFr Bld: 5.4 % (ref 4.6–6.5)

## 2021-08-27 LAB — VITAMIN D 25 HYDROXY (VIT D DEFICIENCY, FRACTURES): VITD: 56.2 ng/mL (ref 30.00–100.00)

## 2021-08-27 MED ORDER — AZITHROMYCIN 250 MG PO TABS: ORAL_TABLET | ORAL | 0 refills | Status: DC

## 2021-08-27 NOTE — Assessment & Plan Note (Signed)
On Prozac 40 mg daily though occasionally takes 60 mg daily as needed during times of high stress.  She also uses Xanax as needed sparingly.  Current regimen is working well.  We will continue.  Does not need refill today.

## 2021-08-27 NOTE — Progress Notes (Signed)
Chief Complaint:  [name] is a 76 y.o. female who presents today for her annual comprehensive physical exam.    Assessment/Plan:  New/Acute Problems: Sinusitis No red flags.  Given symptoms have been persistent for the past 10 days we will start azithromycin.  Encouraged hydration.  She can continue over-the-counter meds.  Discussed reasons to return to care.  Chronic Problems Addressed Today: Depression Had a stressful event recently related to the health of her son but this has mostly resolved and she is back at her baseline.  She takes Prozac 40 mg daily though occasionally takes 60 mg daily as needed during times of high stress.  Hyperlipidemia Check labs.  She is on Repatha.  If LDLs are still persistently high would consider addition of Zetia.  She has not tolerated statins in the past.  Anxiety On Prozac 40 mg daily though occasionally takes 60 mg daily as needed during times of high stress.  She also uses Xanax as needed sparingly.  Current regimen is working well.  We will continue.  Does not need refill today.  CAD (coronary artery disease), native coronary artery Check labs.  Continue Repatha.Follows with cardiology.  Vitamin D deficiency, unspecified Check vitamin D.  Stage 3 chronic kidney disease Check cmet.    Preventative Healthcare: Check hep C.  She will be getting DEXA soon.  Up-to-date on mammogram and other cancer screenings.  Check labs today.  Patient Counseling(The following topics were reviewed and/or handout was given):  -Nutrition: Stressed importance of moderation in sodium/caffeine intake, saturated fat and cholesterol, caloric balance, sufficient intake of fresh fruits, vegetables, and fiber.  -Stressed the importance of regular exercise.   -Substance Abuse: Discussed cessation/primary prevention of tobacco, alcohol, or other drug use; driving or other dangerous activities under the influence; availability of treatment for abuse.   -Injury  prevention: Discussed safety belts, safety helmets, smoke detector, smoking near bedding or upholstery.   -Sexuality: Discussed sexually transmitted diseases, partner selection, use of condoms, avoidance of unintended pregnancy and contraceptive alternatives.   -Dental health: Discussed importance of regular tooth brushing, flossing, and dental visits.  -Health maintenance and immunizations reviewed. Please refer to Health maintenance section.  Return to care in 1 year for next preventative visit.     Subjective:  HPI:  She has had sinus congestion for the last 10 days. Symptoms seem to be stable. Tried mucinex with no improvement. A lot of congestion. A lot of sneezing. No fevers or chills.   Lifestyle Diet: Balanced. Plenty of fruits and vegetables.  Exercise: Likes to walk.   Depression screen [facility] 2/9 08/05/2021  Decreased Interest 0  Down, Depressed, Hopeless 0  PHQ - 2 Score 0  Altered sleeping -  Tired, decreased energy -  Change in appetite -  Feeling bad or failure about yourself  -  Trouble concentrating -  Moving slowly or fidgety/restless -  Suicidal thoughts -  PHQ-9 Score -  Some recent data might be hidden    Health Maintenance Due  Topic Date Due   Hepatitis C Screening  Never done     ROS: Per HPI, otherwise a complete review of systems was negative.   PMH:  The following were reviewed and entered/updated in epic: Past Medical History:  Diagnosis Date   Anxiety    Arthritis    "back, neck" (03/08/2017)   Asthma in child    Bowel obstruction ([place])    CAD (coronary artery disease), native coronary artery    Chronic  lower back pain    Chronic neck pain    Closed left tibial fracture    Colon polyps    Complication of anesthesia    "I was awake during one of my surgeries" reports that she felt them start the incision and heard them talking reports happened ~ 35 years ago     Depression    Diverticulosis    Hyperlipidemia    Irritable bowel syndrome     Migraine    "used to be severe; now maybe 1/year or 2" (03/08/2017)   Pneumonia    H!N!   Patient Active Problem List   Diagnosis Date Noted   Urge incontinence 02/11/2021   Osteoporosis 05/31/2019   Statin myopathy 05/31/2019   Essential tremor 05/31/2019   History of colonic polyps 05/31/2019   Stage 3 chronic kidney disease ([place]) 05/31/2019   Vitamin D deficiency, unspecified 05/31/2019   CAD (coronary artery disease), native coronary artery    Anxiety    Depression    Hyperlipidemia    Past Surgical History:  Procedure Laterality Date   ANKLE HARDWARE REMOVAL Left 07/27/2017   Hardware removal left ankle, takedown non-union with allograft bone graft, Intramedullary Nail (Left)   APPENDECTOMY     AUGMENTATION MAMMAPLASTY Bilateral    BRACHIOPLASTY Bilateral    Arm Lift    CARDIAC CATHETERIZATION     CHOLECYSTECTOMY OPEN     COLON SURGERY     EXTERNAL FIXATION LEG Left 03/08/2017   Procedure: INTERNAL FIXATION LEFT ANKLE;  Surgeon: Newt Minion, MD;  Location: [place];  Service: Orthopedics;  Laterality: Left;   EYE MUSCLE SURGERY Bilateral X 2   "lazy eye; OR twice on each eye; Dr. Annamaria Boots"   [place] Left 07/27/2017   Procedure: HARDWARE REMOVAL;  Surgeon: Mcarthur Rossetti, MD;  Location: [place];  Service: Orthopedics;  Laterality: Left;   LEFT HEART CATH AND CORONARY ANGIOGRAPHY N/A 10/27/2016   Procedure: Left Heart Cath and Coronary Angiography;  Surgeon: Peter M Martinique, MD;  Location: [place] [facility];  Service: Cardiovascular;  Laterality: N/A;   LEFT HEART CATHETERIZATION WITH CORONARY ANGIOGRAM N/A 08/16/2014   Procedure: LEFT HEART CATHETERIZATION WITH CORONARY ANGIOGRAM;  Surgeon: Sinclair Grooms, MD;  [facility];  Service: Cardiovascular;  Laterality: N/A;   NASAL SINUS SURGERY     ORIF ANKLE FRACTURE Left 03/08/2017   ORIF ANKLE FRACTURE Left 07/27/2017   Procedure: Hardware removal left ankle, takedown non-union  with allograft bone graft, Intramedullary Nail;  Surgeon: Mcarthur Rossetti, MD;  Location: [place];  Service: Orthopedics;  Laterality: Left;   PARTIAL COLECTOMY  1970s   TONSILLECTOMY      Family History  Problem Relation Age of Onset   Heart disease Mother        CHF   Coronary artery disease Mother    Hypertension Mother    Hyperlipidemia Mother    Heart disease Father        CHF   Coronary artery disease Father    Diabetes Sister        with all the complications   Coronary artery disease Sister    Stroke Sister 61       CVA   Peripheral vascular disease Sister    Colon cancer Maternal Grandfather    Healthy Son    Liver disease Son        liver failure    Cancer Neg Hx  breast or colon    Medications- reviewed and updated Current Outpatient Medications  Medication Sig Dispense Refill   ALPRAZolam (XANAX) 0.5 MG tablet Take 1 tablet by mouth twice daily 60 tablet 5   azithromycin (ZITHROMAX) 250 MG tablet Take 2 tabs day 1, then 1 tab daily 6 each 0   FLUoxetine (PROZAC) 20 MG capsule Take 1 capsule (20 mg total) by mouth daily. 90 capsule 1   FLUoxetine (PROZAC) 40 MG capsule Take 1 capsule (40 mg total) by mouth daily. 90 capsule 1   REPATHA SURECLICK 762 MG/ML SOAJ INJECT 1 PEN INTO THE SKIN EVERY 14 DAYS 6 mL 3   tetrahydrozoline 0.05 % ophthalmic solution      traZODone (DESYREL) 50 MG tablet TAKE 1 TABLET BY MOUTH AT BEDTIME (QUALITEST BRAND ONLY: TEVA) 90 tablet 1   triamcinolone cream (KENALOG) 0.1 % Apply 1 application topically 2 (two) times daily.     No current facility-administered medications for this visit.    Allergies-reviewed and updated Allergies  Allergen Reactions   Shellfish-Derived Products Anaphylaxis, Shortness Of Breath and Swelling    Throat swells; Iodine is ok   Lipitor [Atorvastatin] Nausea Only and Other (See Comments)    Fatigue and Lethargy. Leg, back,Joint, and muscle pain. Per patient "My stool was a muddy clay color  and I having diarrhea" pt states "I never want to take another statin again"   Crestor [Rosuvastatin Calcium]     UNSPECIFIED REACTION    Evolocumab Other (See Comments)    Other reaction(s): rash   Metoprolol     Other reaction(s): very tired, blurred vision   Pravastatin     fatigue   Prednisone Other (See Comments)    Other reaction(s): insomnia, shakes   Zetia [Ezetimibe]     fatigue   Codeine Nausea And Vomiting    Social History   Socioeconomic History   Marital status: Married    Spouse name: Not on file   Number of children: 2   Years of education: Not on file   Highest education level: Bachelor's degree (e.g., BA, AB, BS)  Occupational History   Occupation: Retired Merchant navy officer)     Employer: RETIRED  Tobacco Use   Smoking status: Former    Packs/day: 1.00    Years: 41.00    Pack years: 41.00    Types: Cigarettes    Quit date: 01/10/2010    Years since quitting: 11.6   Smokeless tobacco: Never  Vaping Use   Vaping Use: Former  Substance and Sexual Activity   Alcohol use: No   Drug use: No   Sexual activity: Not Currently    Partners: Female  Other Topics Concern   Not on file  [place]   Married '72-16 yrs/divorce; married '92   1 grandchild   Niece (jamie-Madeline's daughter, with 3 children and full time Ship broker)   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   Marriage in good health.      Physician roster   Gyn- Helene Shoe, NP   Opthal- Dr Annamaria Boots (pediatric opthal)   GI- dr Adriana Simas- Dr Gladstone Lighter   ENT- Dr Ernesto Rutherford            Social Determinants of Health   Financial Resource Strain: Low Risk    Difficulty of Paying Living Expenses: Not hard at all  Food Insecurity: No Food Insecurity   Worried About [place] in the Last Year: Never true  Ran Out of Food in the Last Year: Never true  Transportation Needs: No Transportation Needs   Lack of Transportation (Medical): No   Lack of Transportation  (Non-Medical): No  Physical Activity: Sufficiently Active   Days of Exercise per Week: 5 days   Minutes of Exercise per Session: 50 min  Stress: No Stress Concern Present   Feeling of Stress : Only a little  Social Connections: Moderately Isolated   Frequency of Communication with Friends and Family: More than three times a week   Frequency of Social Gatherings with Friends and Family: More than three times a week   Attends Religious Services: Never   Marine scientist or Organizations: No   Attends Music therapist: Never   Marital Status: Married        Objective:  Physical Exam: BP 114/69 (BP Location: Left Arm)    Pulse 75    Temp (!) 97.3 F (36.3 C) (Temporal)    Ht 5' 0.83" (1.545 m)    Wt 131 lb 3.2 oz (59.5 kg)    SpO2 97%    BMI 24.93 kg/m   Body mass index is 24.93 kg/m. Wt Readings from Last 3 Encounters:  08/27/21 131 lb 3.2 oz (59.5 kg)  02/11/21 131 lb 3.2 oz (59.5 kg)  06/26/20 132 lb 3.2 oz (60 kg)   Gen: NAD, resting comfortably HEENT: TMs normal bilaterally. OP clear. No thyromegaly noted.  CV: RRR with no murmurs appreciated Pulm: NWOB, CTAB with no crackles, wheezes, or rhonchi GI: Normal bowel sounds present. Soft, Nontender, Nondistended. MSK: no edema, cyanosis, or clubbing noted Skin: warm, dry Neuro: CN2-12 grossly intact. Strength 5/5 in upper and lower extremities. Reflexes symmetric and intact bilaterally.  Psych: Normal affect and thought content     Cedrik Heindl M. [name], MD 08/27/2021 8:49 AM

## 2021-08-27 NOTE — Assessment & Plan Note (Signed)
Check labs.  She is on Repatha.  If LDLs are still persistently high would consider addition of Zetia.  She has not tolerated statins in the past.

## 2021-08-27 NOTE — Assessment & Plan Note (Signed)
Check cmet.

## 2021-08-27 NOTE — Assessment & Plan Note (Signed)
Check labs.  Continue Repatha.Follows with cardiology.

## 2021-08-27 NOTE — Assessment & Plan Note (Signed)
Had a stressful event recently related to the health of her son but this has mostly resolved and she is back at her baseline.  She takes Prozac 40 mg daily though occasionally takes 60 mg daily as needed during times of high stress.

## 2021-08-27 NOTE — Patient Instructions (Addendum)
It was very nice to see you today!  Please start the Z-Pak for your sinus infection.  We will check blood work today.  Please get your bone density scan done soon.  We may need to start cholesterol medication depending on your results.  I will see back in 1 year for your next annual physical.  Please come back sooner if needed.  Take care, Dr [name]  PLEASE NOTE:  If you had any lab tests please let us know if you have not heard back within a few days. You may see your results on mychart before we have a chance to review them but we will give you a call once they are reviewed by [place]. If we ordered any referrals today, please let us know if you have not heard from their office within the next week.   Please try these tips to maintain a healthy lifestyle:  Eat at least 3 REAL meals and 1-2 snacks per day.  Aim for no more than 5 hours between eating.  If you eat breakfast, please do so within one hour of getting up.   Each meal should contain half fruits/vegetables, one quarter protein, and one quarter carbs (no bigger than a computer mouse)  Cut down on sweet beverages. This includes juice, soda, and sweet tea.   Drink at least 1 glass of water with each meal and aim for at least 8 glasses per day  Exercise at least 150 minutes every week.   Preventive Care 14 Years and Older, Female Preventive care refers to lifestyle choices and visits with your health care provider that can promote health and wellness. Preventive care visits are also called wellness exams. What can I expect for my preventive care visit? Counseling Your health care provider may ask you questions about your: Medical history, including: Past medical problems. Family medical history. Pregnancy and menstrual history. History of falls. Current health, including: Memory and ability to understand (cognition). Emotional well-being. Home life and relationship well-being. Sexual activity and sexual  health. Lifestyle, including: Alcohol, nicotine or tobacco, and drug use. Access to firearms. Diet, exercise, and sleep habits. Work and work Statistician. Sunscreen use. Safety issues such as seatbelt and bike helmet use. Physical exam Your health care provider will check your: Height and weight. These may be used to calculate your BMI (body mass index). BMI is a measurement that tells if you are at a healthy weight. Waist circumference. This measures the distance around your waistline. This measurement also tells if you are at a healthy weight and may help predict your risk of certain diseases, such as type 2 diabetes and high blood pressure. Heart rate and blood pressure. Body temperature. Skin for abnormal spots. What immunizations do I need? Vaccines are usually given at various ages, according to a schedule. Your health care provider will recommend vaccines for you based on your age, medical history, and lifestyle or other factors, such as travel or where you work. What tests do I need? Screening Your health care provider may recommend screening tests for certain conditions. This may include: Lipid and cholesterol levels. Hepatitis C test. Hepatitis B test. HIV (human immunodeficiency virus) test. STI (sexually transmitted infection) testing, if you are at risk. Lung cancer screening. Colorectal cancer screening. Diabetes screening. This is done by checking your blood sugar (glucose) after you have not eaten for a while (fasting). Mammogram. Talk with your health care provider about how often you should have regular mammograms. BRCA-related cancer screening. This may be  done if you have a family history of breast, ovarian, tubal, or peritoneal cancers. Bone density scan. This is done to screen for osteoporosis. Talk with your health care provider about your test results, treatment options, and if necessary, the need for more tests. Follow these instructions at home: Eating and  drinking  Eat a diet that includes fresh fruits and vegetables, whole grains, lean protein, and low-fat dairy products. Limit your intake of foods with high amounts of sugar, saturated fats, and salt. Take vitamin and mineral supplements as recommended by your health care provider. Do not drink alcohol if your health care provider tells you not to drink. If you drink alcohol: Limit how much you have to 0-1 drink a day. Know how much alcohol is in your drink. In the [place] one drink equals one 12 oz bottle of beer (355 mL), one 5 oz glass of wine (148 mL), or one 1 oz glass of hard liquor (44 mL). Lifestyle Brush your teeth every morning and night with fluoride toothpaste. Floss one time each day. Exercise for at least 30 minutes 5 or more days each week. Do not use any products that contain nicotine or tobacco. These products include cigarettes, chewing tobacco, and vaping devices, such as e-cigarettes. If you need help quitting, ask your health care provider. Do not use drugs. If you are sexually active, practice safe sex. Use a condom or other form of protection in order to prevent STIs. Take aspirin only as told by your health care provider. Make sure that you understand how much to take and what form to take. Work with your health care provider to find out whether it is safe and beneficial for you to take aspirin daily. Ask your health care provider if you need to take a cholesterol-lowering medicine (statin). Find healthy ways to manage stress, such as: Meditation, yoga, or listening to music. Journaling. Talking to a trusted person. Spending time with friends and family. Minimize exposure to UV radiation to reduce your risk of skin cancer. Safety Always wear your seat belt while driving or riding in a vehicle. Do not drive: If you have been drinking alcohol. Do not ride with someone who has been drinking. When you are tired or distracted. While texting. If you have been using any  mind-altering substances or drugs. Wear a helmet and other protective equipment during sports activities. If you have firearms in your house, make sure you follow all gun safety procedures. What's next? Visit your health care provider once a year for an annual wellness visit. Ask your health care provider how often you should have your eyes and teeth checked. Stay up to date on all vaccines. This information is not intended to replace advice given to you by your health care provider. Make sure you discuss any questions you have with your health care provider. Document Revised: 12/25/2020 Document Reviewed: 12/25/2020 Elsevier Patient Education  [place].

## 2021-08-27 NOTE — Assessment & Plan Note (Signed)
Check vitamin D. 

## 2021-08-28 LAB — LIPID PANEL
Cholesterol: 255 mg/dL — ABNORMAL HIGH (ref 0–200)
HDL: 80.9 mg/dL (ref >39.00)
LDL Cholesterol: 163 mg/dL — ABNORMAL HIGH (ref 0–99)
NonHDL: 173.62
Total CHOL/HDL Ratio: 3
Triglycerides: 54 mg/dL (ref 0.0–149.0)
VLDL: 10.8 mg/dL (ref 0.0–40.0)

## 2021-08-28 LAB — COMPREHENSIVE METABOLIC PANEL
ALT: 11 U/L (ref 0–35)
AST: 19 U/L (ref 0–37)
Albumin: 4.4 g/dL (ref 3.5–5.2)
Alkaline Phosphatase: 19 U/L — ABNORMAL LOW (ref 39–117)
BUN: 23 mg/dL (ref 6–23)
CO2: 27 mEq/L (ref 19–32)
Calcium: 9.7 mg/dL (ref 8.4–10.5)
Chloride: 104 mEq/L (ref 96–112)
Creatinine, Ser: 1.2 mg/dL (ref 0.40–1.20)
GFR: 44.81 mL/min — ABNORMAL LOW (ref >60.00)
Glucose, Bld: 93 mg/dL (ref 70–99)
Potassium: 4.3 mEq/L (ref 3.5–5.1)
Sodium: 141 mEq/L (ref 135–145)
Total Bilirubin: 0.6 mg/dL (ref 0.2–1.2)
Total Protein: 7.1 g/dL (ref 6.0–8.3)

## 2021-08-28 LAB — HEPATITIS C ANTIBODY
Hepatitis C Ab: NONREACTIVE
SIGNAL TO CUT-OFF: <0.02 (ref <1.00)

## 2021-08-29 NOTE — Progress Notes (Signed)
Please inform patient of the following:  Her LDL is not at goal. We can add on zetia 10mg  daily. Please send in new rx if she is agreeable. The rest of her labs are all NORMAL. We can recheck in a year.  [name]. [name], MD 08/29/2021 12:37 PM

## 2021-09-01 ENCOUNTER — Other Ambulatory Visit: Payer: Self-pay | Admitting: *Deleted

## 2021-09-01 MED ORDER — EZETIMIBE 10 MG PO TABS: 10.0000 mg | ORAL_TABLET | Freq: Every day | ORAL | 3 refills | Status: DC

## 2021-09-22 ENCOUNTER — Telehealth: Payer: Self-pay | Admitting: Family Medicine

## 2021-09-22 DIAGNOSIS — M810 Age-related osteoporosis without current pathological fracture: Secondary | ICD-10-CM

## 2021-09-22 NOTE — Telephone Encounter (Signed)
Called pt and LVM to return my call  ?

## 2021-09-22 NOTE — Telephone Encounter (Signed)
Patient is asking for a bone density exam - stated the referral is to be sent to [facility] radiology.  ? ?Also mentioned she has eczema and needs meds called in - Last [facility] 08/27/21 no future apts on sch.   ?

## 2021-09-23 NOTE — Telephone Encounter (Signed)
Patient informed of the message below.

## 2021-09-23 NOTE — Telephone Encounter (Signed)
Spoke with the patient and she stated she already has a prescription for Triamcinolone cream and this is not helping.  Requests another Rx be sent to [facility] on [address].  Patient is aware the order was placed for the bone density and someone will contact her with appt info.  Message sent to PCP. ?

## 2021-09-23 NOTE — Addendum Note (Signed)
Addended by: Agnes Lawrence on: 09/23/2021 10:37 AM ? ? Modules accepted: Orders ? ?

## 2021-09-23 NOTE — Telephone Encounter (Signed)
Ok to refill her triamcinolone and place order for DEXA. ? ?Algis Greenhouse. [name], MD ?09/23/2021 8:01 AM  ? ?

## 2021-09-23 NOTE — Telephone Encounter (Signed)
We can discuss at her office visit regarding any new treatments for her rash. ? ?[name]. [name], MD ?09/23/2021 10:41 AM  ? ?

## 2021-09-25 ENCOUNTER — Ambulatory Visit: Payer: Medicare PPO | Admitting: Family Medicine

## 2021-09-29 DIAGNOSIS — L718 Other rosacea: Secondary | ICD-10-CM | POA: Diagnosis not present

## 2021-09-29 DIAGNOSIS — L710 Perioral dermatitis: Secondary | ICD-10-CM | POA: Diagnosis not present

## 2021-10-03 ENCOUNTER — Other Ambulatory Visit: Payer: Self-pay

## 2021-10-03 ENCOUNTER — Telehealth: Payer: Self-pay

## 2021-10-03 DIAGNOSIS — M810 Age-related osteoporosis without current pathological fracture: Secondary | ICD-10-CM

## 2021-10-03 NOTE — Telephone Encounter (Signed)
Patient called in and states her bone density needs to be sent to [facility]. States they have not received them still. Please call patient once order is complete.  ?

## 2021-10-03 NOTE — Telephone Encounter (Signed)
Patient order was placed again and called to advise of they state again not receiving I will call them directly and fax if needed.  ?

## 2021-10-04 ENCOUNTER — Other Ambulatory Visit: Payer: Self-pay | Admitting: Family Medicine

## 2021-10-15 NOTE — Telephone Encounter (Signed)
Patient called back would like to talk to the [place] states she still hasn't heard anything.  ?

## 2021-10-15 NOTE — Telephone Encounter (Signed)
Order faxed to [phone] ?Appointment for bone density  schedule for patient on April 17 /2023 ?Patient aware  ?

## 2021-10-23 ENCOUNTER — Other Ambulatory Visit: Payer: Self-pay | Admitting: Family Medicine

## 2021-10-24 LAB — HM DEXA SCAN

## 2021-10-27 DIAGNOSIS — Z780 Asymptomatic menopausal state: Secondary | ICD-10-CM | POA: Diagnosis not present

## 2021-10-27 DIAGNOSIS — M8588 Other specified disorders of bone density and structure, other site: Secondary | ICD-10-CM | POA: Diagnosis not present

## 2021-10-27 DIAGNOSIS — M810 Age-related osteoporosis without current pathological fracture: Secondary | ICD-10-CM | POA: Diagnosis not present

## 2021-10-27 LAB — HM DEXA SCAN

## 2021-10-29 ENCOUNTER — Encounter: Payer: Self-pay | Admitting: Family Medicine

## 2021-10-30 ENCOUNTER — Telehealth: Payer: Self-pay | Admitting: Family Medicine

## 2021-10-30 NOTE — Telephone Encounter (Signed)
Pt is asking for someone to call her regarding her results of her Bone Density Scan ?

## 2021-10-30 NOTE — Telephone Encounter (Signed)
I don't see any recent results for her DEXA in her chart. ? ?[name]. [name], MD ?10/30/2021 3:21 PM  ? ?

## 2021-10-30 NOTE — Telephone Encounter (Signed)
Called pt and advised once Dr [name] has reviewed her results we would call her to discuss them with her. Pt verbalized understanding ?

## 2021-10-30 NOTE — Telephone Encounter (Signed)
Please advise 

## 2021-11-05 ENCOUNTER — Telehealth: Payer: Self-pay

## 2021-11-05 NOTE — Telephone Encounter (Signed)
Patient aware no results in chart  ?Will Call [facility] in the morning for results to be faxed to [place]  ?

## 2021-11-05 NOTE — Telephone Encounter (Signed)
Patient is calling back in regard to Dexa results.  States she has already called our office once and was told we would have the results by this past Monday.  States Dexa was completed on 4/17.  I do see report in chart.  Please follow back up with patient in regard to results.

## 2021-11-06 NOTE — Telephone Encounter (Signed)
Call [facility] [phone] for results  ?Results will be fax today  ?

## 2021-11-07 NOTE — Telephone Encounter (Signed)
Called [facility] on 11/06/2021, will faxed results  ?

## 2021-11-10 ENCOUNTER — Encounter: Payer: Self-pay | Admitting: Family Medicine

## 2021-11-10 ENCOUNTER — Other Ambulatory Visit: Payer: Self-pay | Admitting: Family Medicine

## 2021-11-10 NOTE — Telephone Encounter (Signed)
Results uploaded please advise ?

## 2021-11-11 NOTE — Telephone Encounter (Signed)
Her score improved from -2.8 in 2020 to -2.6  on her recent scan.  ? ?She should continue the prolia per endocrinology and we can recheck in 2 years. ? ?Algis Greenhouse. [name], MD ?11/11/2021 3:44 PM  ? ? ?

## 2021-11-19 NOTE — Telephone Encounter (Signed)
Prolia VOB initiated via [email or web address] ? ?Last Prolia inj 06/17/21 ?Next Prolia inj due 12/17/21 ? ?PA expired 07/12/21 ?

## 2021-11-25 ENCOUNTER — Ambulatory Visit (INDEPENDENT_AMBULATORY_CARE_PROVIDER_SITE_OTHER)
Admission: RE | Admit: 2021-11-25 | Discharge: 2021-11-25 | Disposition: A | Discharge status: Home / Self Care | Payer: Medicare PPO | Source: Ambulatory Visit | Attending: Family Medicine | Admitting: Family Medicine

## 2021-11-25 ENCOUNTER — Ambulatory Visit: Payer: Medicare PPO | Admitting: Family Medicine

## 2021-11-25 ENCOUNTER — Encounter: Payer: Self-pay | Admitting: Family Medicine

## 2021-11-25 VITALS — BP 99/66 | HR 86 | Temp 97.2°F | Ht 60.0 in | Wt 130.4 lb

## 2021-11-25 DIAGNOSIS — M545 Low back pain, unspecified: Secondary | ICD-10-CM

## 2021-11-25 DIAGNOSIS — G8929 Other chronic pain: Secondary | ICD-10-CM | POA: Diagnosis not present

## 2021-11-25 DIAGNOSIS — J329 Chronic sinusitis, unspecified: Secondary | ICD-10-CM | POA: Diagnosis not present

## 2021-11-25 DIAGNOSIS — N3941 Urge incontinence: Secondary | ICD-10-CM | POA: Diagnosis not present

## 2021-11-25 MED ORDER — MIRABEGRON ER 25 MG PO TB24: 25.0000 mg | ORAL_TABLET | Freq: Every day | ORAL | 5 refills | Status: DC

## 2021-11-25 MED ORDER — AZELASTINE HCL 0.1 % NA SOLN: 2.0000 | Freq: Two times a day (BID) | NASAL | 12 refills | Status: DC

## 2021-11-25 MED ORDER — AZITHROMYCIN 250 MG PO TABS: ORAL_TABLET | ORAL | 0 refills | Status: DC

## 2021-11-25 NOTE — Progress Notes (Signed)
? ?  [name] is a 76 y.o. female who presents today for an office visit. ? ?Assessment/Plan:  ?New/Acute Problems: ?Sinusitis  ?No red flags.  Given length of symptoms will start azithromycin.  This is worked well for her in the past.  Also start Astelin nasal spray.  She can continue over-the-counter medications.  Encouraged hydration.  We discussed reasons to return to care.  Follow-up as needed. ? ?Chronic Problems Addressed Today: ?Chronic low back pain ?No red flags though symptoms have become worse over the last several months.  Likely has underlying osteoarthritis.  We will update her x-rays today as it has been a few years since she has had any x-rays done.  We will also place referral to physical therapy and sports medicine. ? ?Urge incontinence ?Worsening since our last visit.  We discussed treatment options.  We will try Myrbetriq.  She will send me a message in a few weeks via MyChart to let me know how this is working. ? ? ?  ?Subjective:  ?HPI: ? ?Patient here with concern for sinus infection.  Symptoms started several weeks ago.  Symptoms include headache, facial pressure, sneezing, congestion.  She has tried taking Claritin with no improvement.  Feels like previous sinus infections.  No fevers or chills. ? ?She also has ongoing issues with chronic low back pain.  This has been going on for decades.  Seems to be worsening.  Worse in the morning.  She feels stiff after sitting for a long period of time.  She has worked with physical therapy several years ago but is not sure if it has had much benefit.  ? ?She is also had worsening issues with urge incontinence.  This has been an issue for a while.  She will feel sudden urge to urinate after sitting for a while and then standing up.  We checked a urine culture a few months ago which was negative. ? ?   ?  ?Objective:  ?Physical Exam: ?BP 99/66   Pulse 86   Temp (!) 97.2 ?F (36.2 ?C) (Temporal)   Ht 5' (1.524 m)   Wt 130 lb 6.4 oz (59.1 kg)    SpO2 97%   BMI 25.47 kg/m?   ?Gen: No acute distress, resting comfortably ?HEENT: TMs with clear effusion.  OP erythematous with no exudate.  Nose mucosa erythematous and clear with clear discharge. ?CV: Regular rate and rhythm with no murmurs appreciated ?Pulm: Normal work of breathing, clear to auscultation bilaterally with no crackles, wheezes, or rhonchi ?MSK: ?- Back: No deformities.  Neurovascular intact distally. ?Neuro: Grossly normal, moves all extremities ?Psych: Normal affect and thought content ? ?   ? ?Algis Greenhouse. [name], MD ?11/25/2021 11:57 AM  ?

## 2021-11-25 NOTE — Patient Instructions (Signed)
It was very nice to see you today! ? ?Please start the azithromycin and Astelin for your sinus infection. ? ?I will refer you to see the physical therapist and sports medicine doctor for your back.  Please go get your x-ray soon. ? ?Please try the Myrbetriq for your bladder.  Let me know how this is working in a few weeks. ? ?Take care, ?Dr [name] ? ?PLEASE NOTE: ? ?If you had any lab tests please let us know if you have not heard back within a few days. You may see your results on mychart before we have a chance to review them but we will give you a call once they are reviewed by [place]. If we ordered any referrals today, please let us know if you have not heard from their office within the next week.  ? ?Please try these tips to maintain a healthy lifestyle: ? ?Eat at least 3 REAL meals and 1-2 snacks per day.  Aim for no more than 5 hours between eating.  If you eat breakfast, please do so within one hour of getting up.  ? ?Each meal should contain half fruits/vegetables, one quarter protein, and one quarter carbs (no bigger than a computer mouse) ? ?Cut down on sweet beverages. This includes juice, soda, and sweet tea.  ? ?Drink at least 1 glass of water with each meal and aim for at least 8 glasses per day ? ?Exercise at least 150 minutes every week.   ?

## 2021-11-25 NOTE — Assessment & Plan Note (Signed)
No red flags though symptoms have become worse over the last several months.  Likely has underlying osteoarthritis.  We will update her x-rays today as it has been a few years since she has had any x-rays done.  We will also place referral to physical therapy and sports medicine. ?

## 2021-11-25 NOTE — Assessment & Plan Note (Signed)
Worsening since our last visit.  We discussed treatment options.  We will try Myrbetriq.  She will send me a message in a few weeks via MyChart to let me know how this is working. ?

## 2021-11-26 NOTE — Progress Notes (Signed)
[name] D.[name] [name] [name] Phone: [phone]   Assessment and Plan:     1. Chronic bilateral low back pain with bilateral sciatica 2. DDD (degenerative disc disease), lumbar -Chronic with exacerbation, initial sports medicine visit - More frequent and more severe flares of low back pain with radicular symptoms with underlying DDD of lumbar spine - Per patient, she has spoken to 2 neurosurgeons in the past, and multiple back injections, and tried physical therapy, which ultimately provided some relief, but became less effective over time, and she has not done any recent therapy for her back - We will restart conservative therapy for her low back and progress as needed.  Start with HEP for low back and core, physical therapy which patient has already been referred for - Start Tylenol 650 mg 3 times daily for day-to-day pain relief.  Recommend limiting NSAID use with history of CKD stage III - Reviewed x-ray with patient in clinic.  My interpretation: No acute fracture or vertebral collapse.  Multiple chronic appearing changes most severe in L3, L4, L5 with curvature and decreased lordosis, cortical changes and decreased disc space.  This is likely the underlying cause of patient's intermittent sciatica symptoms   Pertinent previous records reviewed include PCP note 11/25/2021, lumbar spine x-ray 11/25/2021   Follow Up: 3 to 4 weeks for reevaluation.  If no improvement or worsening of symptoms, could consider OMT versus lumbar MRI   Subjective:   I, [name], am serving as a Education administrator for Doctor [name]  Chief Complaint: low back pain   HPI:   11/27/2021 Patient is a 76 year old female complaining of low back pain. Patient states ongoing issues with chronic low back pain.  This has been going on for decades.  Seems to be worsening.  Worse in the morning.  She feels stiff after sitting for a long period of  time.  She has worked with physical therapy several years ago but is not sure if it has had much benefit. Hx of back labor ,hx of broken leg 8 places , if she is laying down and twisting it feels like its through the hips and top of sacrum , gets a lot of joint pains , a lot of pressure any way she moves she knows it there does HEP everyday she does feel better for a while but it always come back, hx of scoliosis , hx of stenosis she has been told, does take tylenol and aleve but it doesn't help , is on a z pack and a med to help with bladder control   Relevant Historical Information: S/p ORIF left lower extremity for compound fracture in 2018, CAD, hypertension, CKD stage III  Additional pertinent review of systems negative.   Current Outpatient Medications:    ALPRAZolam (XANAX) 0.5 MG tablet, Take 1 tablet by mouth twice daily, Disp: 60 tablet, Rfl: 0   azelastine (ASTELIN) 0.1 % nasal spray, Place 2 sprays into both nostrils 2 (two) times daily., Disp: 30 mL, Rfl: 12   azithromycin (ZITHROMAX) 250 MG tablet, Take 2 tabs day 1, then 1 tab daily, Disp: 6 each, Rfl: 0   ezetimibe (ZETIA) 10 MG tablet, Take 1 tablet (10 mg total) by mouth daily., Disp: 90 tablet, Rfl: 3   FLUoxetine (PROZAC) 20 MG capsule, Take 1 capsule (20 mg total) by mouth daily., Disp: 90 capsule, Rfl: 1   FLUoxetine (PROZAC) 40 MG capsule, Take 1  capsule by mouth once daily, Disp: 90 capsule, Rfl: 0   mirabegron ER (MYRBETRIQ) 25 MG TB24 tablet, Take 1 tablet (25 mg total) by mouth daily., Disp: 30 tablet, Rfl: 5   REPATHA SURECLICK 557 MG/ML SOAJ, INJECT 1 PEN INTO THE SKIN EVERY 14 DAYS, Disp: 6 mL, Rfl: 3   tetrahydrozoline 0.05 % ophthalmic solution, , Disp: , Rfl:    traZODone (DESYREL) 50 MG tablet, TAKE 1 TABLET BY MOUTH AT BEDTIME, Disp: 90 tablet, Rfl: 0   triamcinolone cream (KENALOG) 0.1 %, Apply 1 application topically 2 (two) times daily., Disp: , Rfl:    Objective:     Vitals:   11/27/21 0930  BP: 110/80   Pulse: 91  SpO2: 98%  Weight: 131 lb (59.4 kg)  Height: 5' (1.524 m)      Body mass index is 25.58 kg/m.    Physical Exam:    Gen: Appears well, nad, nontoxic and pleasant Psych: Alert and oriented, appropriate mood and affect Neuro: sensation intact, strength is 5/5 in upper and lower extremities, muscle tone wnl Skin: no susupicious lesions or rashes  Back - Normal skin, Spine with normal alignment and no deformity.   Mild tenderness to vertebral process palpation.   Paraspinous muscles are moderately tender and without spasm Straight leg raise negative Gait normal   Electronically signed by:  [name] D.[name] Sports Medicine 10:02 AM 11/27/21

## 2021-11-27 ENCOUNTER — Ambulatory Visit: Payer: Medicare PPO | Admitting: Sports Medicine

## 2021-11-27 VITALS — BP 110/80 | HR 91 | Ht 60.0 in | Wt 131.0 lb

## 2021-11-27 DIAGNOSIS — M5136 Other intervertebral disc degeneration, lumbar region: Secondary | ICD-10-CM

## 2021-11-27 DIAGNOSIS — G8929 Other chronic pain: Secondary | ICD-10-CM | POA: Diagnosis not present

## 2021-11-27 DIAGNOSIS — M5441 Lumbago with sciatica, right side: Secondary | ICD-10-CM

## 2021-11-27 DIAGNOSIS — M5442 Lumbago with sciatica, left side: Secondary | ICD-10-CM

## 2021-11-27 NOTE — Progress Notes (Signed)
Please inform patient of the following:  Her xray confirms degenerative changes. She should follow up with sports medicine as we discussed at her office visit.  Algis Greenhouse. [name], MD 11/27/2021 10:30 AM

## 2021-11-27 NOTE — Patient Instructions (Addendum)
Good to see you  Tylenol 650 mg 3 x a day for day to day pain  Start HEP for core and low back  Establish care with PT  3-4 week follow up

## 2021-12-01 ENCOUNTER — Telehealth: Payer: Self-pay | Admitting: Family Medicine

## 2021-12-01 NOTE — Telephone Encounter (Signed)
Patient would like to know if she can schedule lab for cholesterol recheck?  If you can not reach patient at home number you can try her spouse at [phone] to see if she is with him.

## 2021-12-01 NOTE — Telephone Encounter (Signed)
Via communication with CMA, confirmed appointment is not needed.  Called pt, lvm explaining the year follow up request and cancelled OV scheduled for 12/02/21.

## 2021-12-01 NOTE — Telephone Encounter (Signed)
Pt states she was told to come back "in a few months" to have cholesterol rechecked.   FO Rep cannot find evidence of this request.  Please advise.

## 2021-12-01 NOTE — Telephone Encounter (Signed)
Patient has OV tomorrow with PCP

## 2021-12-02 ENCOUNTER — Other Ambulatory Visit: Payer: Self-pay | Admitting: *Deleted

## 2021-12-02 ENCOUNTER — Ambulatory Visit: Payer: Medicare PPO | Admitting: Family Medicine

## 2021-12-02 DIAGNOSIS — E7800 Pure hypercholesterolemia, unspecified: Secondary | ICD-10-CM

## 2021-12-02 NOTE — Telephone Encounter (Signed)
Patient requesting Lipids recheck, ok to order?

## 2021-12-02 NOTE — Telephone Encounter (Signed)
Ok with me. Please place any necessary orders. 

## 2021-12-02 NOTE — Telephone Encounter (Signed)
Labs order

## 2021-12-10 ENCOUNTER — Encounter: Payer: Self-pay | Admitting: Physical Therapy

## 2021-12-10 ENCOUNTER — Ambulatory Visit: Payer: Medicare PPO | Admitting: Physical Therapy

## 2021-12-10 DIAGNOSIS — M5459 Other low back pain: Secondary | ICD-10-CM | POA: Diagnosis not present

## 2021-12-10 DIAGNOSIS — M6281 Muscle weakness (generalized): Secondary | ICD-10-CM | POA: Diagnosis not present

## 2021-12-10 NOTE — Therapy (Signed)
OUTPATIENT PHYSICAL THERAPY THORACOLUMBAR EVALUATION   Patient Name: [name] MRN: [id number] DOB:12-24-1945, 76 y.o., female Today's Date: 12/10/2021   PT End of Session - 12/10/21 1430     Visit Number 1    Number of Visits 6    Date for PT Re-Evaluation 02/04/22    Authorization Type Mcarthur Rossetti auth requested    PT Start Time 1431    PT Stop Time 1503    PT Time Calculation (min) 32 min    Activity Tolerance Patient tolerated treatment well             Past Medical History:  Diagnosis Date   Anxiety    Arthritis    "back, neck" (03/08/2017)   Asthma in child    Bowel obstruction ([place])    CAD (coronary artery disease), native coronary artery    Chronic lower back pain    Chronic neck pain    Closed left tibial fracture    Colon polyps    Complication of anesthesia    "I was awake during one of my surgeries" reports that she felt them start the incision and heard them talking reports happened ~ 35 years ago     Depression    Diverticulosis    Hyperlipidemia    Irritable bowel syndrome    Migraine    "used to be severe; now maybe 1/year or 2" (03/08/2017)   Pneumonia    H!N!   Past Surgical History:  Procedure Laterality Date   ANKLE HARDWARE REMOVAL Left 07/27/2017   Hardware removal left ankle, takedown non-union with allograft bone graft, Intramedullary Nail (Left)   APPENDECTOMY     AUGMENTATION MAMMAPLASTY Bilateral    BRACHIOPLASTY Bilateral    Arm Lift    CARDIAC CATHETERIZATION     CHOLECYSTECTOMY OPEN     COLON SURGERY     EXTERNAL FIXATION LEG Left 03/08/2017   Procedure: INTERNAL FIXATION LEFT ANKLE;  Surgeon: Newt Minion, MD;  Location: [facility];  Service: Orthopedics;  Laterality: Left;   EYE MUSCLE SURGERY Bilateral X 2   "lazy eye; OR twice on each eye; Dr. Annamaria Boots"   [place] Left 07/27/2017   Procedure: HARDWARE REMOVAL;  Surgeon: Mcarthur Rossetti, MD;  Location: [facility];  Service: Orthopedics;   Laterality: Left;   LEFT HEART CATH AND CORONARY ANGIOGRAPHY N/A 10/27/2016   Procedure: Left Heart Cath and Coronary Angiography;  Surgeon: Peter M Martinique, MD;  Location: [facility];  Service: Cardiovascular;  Laterality: N/A;   LEFT HEART CATHETERIZATION WITH CORONARY ANGIOGRAM N/A 08/16/2014   Procedure: LEFT HEART CATHETERIZATION WITH CORONARY ANGIOGRAM;  Surgeon: Sinclair Grooms, MD;  Location: [facility] CATH [facility];  Service: Cardiovascular;  Laterality: N/A;   NASAL SINUS SURGERY     ORIF ANKLE FRACTURE Left 03/08/2017   ORIF ANKLE FRACTURE Left 07/27/2017   Procedure: Hardware removal left ankle, takedown non-union with allograft bone graft, Intramedullary Nail;  Surgeon: Mcarthur Rossetti, MD;  Location: [place];  Service: Orthopedics;  Laterality: Left;   PARTIAL COLECTOMY  1970s   TONSILLECTOMY     Patient Active Problem List   Diagnosis Date Noted   Chronic low back pain 11/25/2021   Urge incontinence 02/11/2021   Osteoporosis 05/31/2019   Statin myopathy 05/31/2019   Essential tremor 05/31/2019   History of colonic polyps 05/31/2019   Stage 3 chronic kidney disease ([place]) 05/31/2019   Vitamin D deficiency, unspecified 05/31/2019   CAD (  coronary artery disease), native coronary artery    Anxiety    Depression    Hyperlipidemia     PCP: Vivi Barrack, MD  REFERRING PROVIDER: Vivi Barrack, MD  REFERRING DIAG: [phone] (ICD-10-CM) - Chronic low back pain, unspecified back pain laterality, unspecified whether sciatica present  Rationale for Evaluation and Treatment Rehabilitation  THERAPY DIAG:  Other low back pain  Muscle weakness (generalized)  ONSET DATE: since her son was born years ago.   SUBJECTIVE:                                                                                                                                                                                           SUBJECTIVE STATEMENT: States that she does exercises every morning  when she gets up and is taking tylenol. States that sometimes when she first gets up it is really difficult to bend forward/ get up. States that she was curious if there is something else she could be doing to help. States that sometimes she is so stiff and painful some mornings she can barely make it to the bathroom in the morning. States anytime she bends over she has pain and difficulties and with lifting overhead. States she also has stiffness sin her neck too. Reports she has had multiple injections  Current HEP: DKC with LX ROT with legs elevated, cat/cow, piriformis stretch  PERTINENT HISTORY:  Osetoarthritis, CAD, hx left tibia fracture, depression, scoliosis  PAIN:  Are you having pain? Yes: NPRS scale: 10/10 Pain location: low back  Pain description: tight, and sore Aggravating factors: AM, walking, bending Relieving factors: exercises    PRECAUTIONS: None  WEIGHT BEARING RESTRICTIONS No  FALLS:  Has patient fallen in last 6 months? No    OCCUPATION: not currently working  PLOF: Independent  PATIENT GOALS to have exercises to do to manage her her symptoms   OBJECTIVE:   DIAGNOSTIC FINDINGS:  Xray 11/25/21 IMPRESSION: Degenerative change without acute abnormality.    SCREENING FOR RED FLAGS: Bowel or bladder incontinence: No Spinal tumors: No Cauda equina syndrome: No Compression fracture: No Abdominal aneurysm: No  COGNITION:  Overall cognitive status: Within functional limits for tasks assessed       MUSCLE LENGTH: Hamstrings: Right 0 deg; Left 0 deg   POSTURE: rounded shoulders, forward head, and decreased lumbar lordosis  PALPATION: Increased tension along lumbar paraspinals, hypomobility in lumbar spine, tenderness in glute meds  LUMBAR ROM: 12/10/2021   Active  A/PROM  eval  Flexion 50% limited pain with return upright  Extension 75% limited  Right lateral flexion 75% limited pain  Left lateral flexion 75% limited  pain  Right rotation    Left rotation    (Blank rows = not tested)     LE Measurements Lower Extremity Right 12/10/2021 Left 12/10/2021   A/PROM MMT A/PROM MMT  Hip Flexion WFL 3+ WFL 3+  Hip Extension WFL 3 WFL 3  Hip Abduction      Hip Adduction      Hip Internal rotation 40  55   Hip External rotation 55  30   Knee Flexion WFL 4 WFL 3+  Knee Extension WFL 4 WFL 4-  Ankle Dorsiflexion      Ankle Plantarflexion      Ankle Inversion      Ankle Eversion       (Blank rows = not tested)  * pain   LUMBAR SPECIAL TESTS:  Neg slump B, Neg SLR B     TODAY'S TREATMENT  12/10/2021 Therapeutic Exercise:  Aerobic: Supine: Prone: press ups on elbows x10   Seated:  Standing: Neuromuscular Re-education: Manual Therapy: Therapeutic Activity: Self Care: Trigger Point Dry Needling:  Modalities:     PATIENT EDUCATION:  Education details: on current presentation, on HEP, on anatomy and POC Person educated: Patient Education method: Explanation, Demonstration, and Handouts Education comprehension: verbalized understanding   HOME EXERCISE PROGRAM: [id number]  ASSESSMENT:  CLINICAL IMPRESSION: Patient is a 76 y.o. female who was seen today for physical therapy evaluation and treatment for LBP. She presents with ROM, strength and pain deficits that are limiting her ability to walk and get out of bed int he morning. Educated patient and answered all questions on this date. Patient would greatly benefit from skilled PT to improve overall function and QOL.   OBJECTIVE IMPAIRMENTS decreased activity tolerance, decreased mobility, difficulty walking, decreased ROM, decreased strength, postural dysfunction, and pain.   ACTIVITY LIMITATIONS carrying, bending, squatting, sleeping, transfers, bed mobility, and toileting  PARTICIPATION LIMITATIONS: meal prep and cleaning  PERSONAL FACTORS Age, Fitness, and 1 comorbidity: scoliosis, chronic back pain  are also affecting patient's functional outcome.    REHAB POTENTIAL: Good  CLINICAL DECISION MAKING: Evolving/moderate complexity  EVALUATION COMPLEXITY: Moderate   GOALS: Goals reviewed with patient?  yes  SHORT TERM GOALS:  Patient will be independent in self management strategies to improve quality of life and functional outcomes. Baseline: new program Target date: 12/31/2021 Goal status: INITIAL  2.  Patient will report at least 50% improvement in overall symptoms and/or function to demonstrate improved functional mobility Baseline: 0% Target date: 12/31/2021 Goal status: INITIAL       LONG TERM GOALS:  Patient will report at least 75% improvement in overall symptoms and/or function to demonstrate improved functional mobility Baseline: 0% Target date: 02/04/22 Goal status: INITIAL  2.  Patient will report reduced stiffness in the morning when getting out of bed Baseline: stiff Target date: 02/04/22 Goal status: INITIAL  3.  Patient will be able to demonstrate at least 50% ROM in all directions of lumbar spine Baseline: see above Target date: 02/04/22 Goal status: INITIAL    PLAN: PT FREQUENCY: 1-2x/week for total of 6 visits  PT DURATION: 8 weeks  PLANNED INTERVENTIONS: Therapeutic exercises, Therapeutic activity, Neuromuscular re-education, Balance training, Gait training, Patient/Family education, Joint manipulation, Joint mobilization, Vestibular training, Orthotic/Fit training, Aquatic Therapy, Dry Needling, Electrical stimulation, Spinal manipulation, Cryotherapy, Moist heat, Traction, Ionotophoresis '4mg'$ /ml Dexamethasone, and Manual therapy.  PLAN FOR NEXT SESSION: core strength, hip strength, hip and back mobility, trual prone exercises.   3:07 PM, 12/10/21 Jerene Pitch, DPT Physical Therapy with [facility]

## 2021-12-15 ENCOUNTER — Other Ambulatory Visit: Payer: Self-pay | Admitting: Family Medicine

## 2021-12-24 ENCOUNTER — Encounter: Payer: Medicare PPO | Admitting: Physical Therapy

## 2021-12-25 DIAGNOSIS — H16223 Keratoconjunctivitis sicca, not specified as Sjogren's, bilateral: Secondary | ICD-10-CM | POA: Diagnosis not present

## 2021-12-25 DIAGNOSIS — H26493 Other secondary cataract, bilateral: Secondary | ICD-10-CM | POA: Diagnosis not present

## 2021-12-29 NOTE — Telephone Encounter (Signed)
Pt ready for scheduling on or after 12/17/21  Out-of-pocket cost due at time of visit: $0  Primary: [employer] [place] Prol[place] co-insurance: 0% Admin fee co-insurance: 0%  Secondary: n/a Prolia co-insurance:  Admin fee co-insurance:   Deductible: does not apply  Prior Auth: APPROVED PA#  [id number] Valid: 11/05/21-07/12/22    ** This summary of benefits is an estimation of the patient's out-of-pocket cost. Exact cost may very based on individual plan coverage.

## 2022-01-09 DIAGNOSIS — H02055 Trichiasis without entropian left lower eyelid: Secondary | ICD-10-CM | POA: Diagnosis not present

## 2022-01-09 DIAGNOSIS — H16223 Keratoconjunctivitis sicca, not specified as Sjogren's, bilateral: Secondary | ICD-10-CM | POA: Diagnosis not present

## 2022-01-09 DIAGNOSIS — H18893 Other specified disorders of cornea, bilateral: Secondary | ICD-10-CM | POA: Diagnosis not present

## 2022-01-12 ENCOUNTER — Other Ambulatory Visit: Payer: Self-pay | Admitting: Family Medicine

## 2022-01-22 DIAGNOSIS — H18893 Other specified disorders of cornea, bilateral: Secondary | ICD-10-CM | POA: Diagnosis not present

## 2022-01-22 DIAGNOSIS — H16223 Keratoconjunctivitis sicca, not specified as Sjogren's, bilateral: Secondary | ICD-10-CM | POA: Diagnosis not present

## 2022-01-26 ENCOUNTER — Other Ambulatory Visit: Payer: Self-pay | Admitting: Family Medicine

## 2022-01-27 NOTE — Telephone Encounter (Signed)
Pt requesting refill for Alprazolam 0.5 mg tablet. Last OV 11/24/2021.

## 2022-02-04 DIAGNOSIS — H16223 Keratoconjunctivitis sicca, not specified as Sjogren's, bilateral: Secondary | ICD-10-CM | POA: Diagnosis not present

## 2022-02-04 DIAGNOSIS — H18893 Other specified disorders of cornea, bilateral: Secondary | ICD-10-CM | POA: Diagnosis not present

## 2022-02-11 ENCOUNTER — Telehealth: Payer: Self-pay | Admitting: Family Medicine

## 2022-02-11 NOTE — Telephone Encounter (Signed)
Patient wants to get her cholesterol checked as she has  bene on a new medication and does not know if its working or not.-    Please add lab orders

## 2022-02-11 NOTE — Telephone Encounter (Signed)
Future lab was order, need Lab appointment

## 2022-02-12 ENCOUNTER — Other Ambulatory Visit: Payer: Medicare PPO

## 2022-02-18 DIAGNOSIS — H18893 Other specified disorders of cornea, bilateral: Secondary | ICD-10-CM | POA: Diagnosis not present

## 2022-02-18 DIAGNOSIS — H16223 Keratoconjunctivitis sicca, not specified as Sjogren's, bilateral: Secondary | ICD-10-CM | POA: Diagnosis not present

## 2022-02-20 ENCOUNTER — Other Ambulatory Visit: Payer: Medicare PPO

## 2022-02-20 DIAGNOSIS — E7800 Pure hypercholesterolemia, unspecified: Secondary | ICD-10-CM

## 2022-02-21 LAB — LIPID PANEL
Cholesterol: 200 mg/dL — ABNORMAL HIGH (ref <200)
HDL: 70 mg/dL (ref >=50)
LDL Cholesterol (Calc): 105 mg/dL (calc) — ABNORMAL HIGH
Non-HDL Cholesterol (Calc): 130 mg/dL (calc) — ABNORMAL HIGH (ref <130)
Total CHOL/HDL Ratio: 2.9 (calc) (ref <5.0)
Triglycerides: 137 mg/dL (ref <150)

## 2022-02-23 ENCOUNTER — Telehealth: Payer: Self-pay | Admitting: Family Medicine

## 2022-02-23 DIAGNOSIS — H16223 Keratoconjunctivitis sicca, not specified as Sjogren's, bilateral: Secondary | ICD-10-CM | POA: Diagnosis not present

## 2022-02-23 NOTE — Telephone Encounter (Signed)
Patient requests to be called at ph# [phone] to discuss cholesterol medication after receiving lab results

## 2022-02-24 NOTE — Telephone Encounter (Signed)
Patient has called back in regard.   I have informed patient that she received lab results at the same time Dr. [name] receives lab results.    I have informed her that Dr. [name] still needs to review.   Patient is requesting call at [phone] with lab results once they are reviewed.  Does not want a message sent through mychart.

## 2022-02-25 DIAGNOSIS — H18892 Other specified disorders of cornea, left eye: Secondary | ICD-10-CM | POA: Diagnosis not present

## 2022-02-25 DIAGNOSIS — H18893 Other specified disorders of cornea, bilateral: Secondary | ICD-10-CM | POA: Diagnosis not present

## 2022-02-25 DIAGNOSIS — H16223 Keratoconjunctivitis sicca, not specified as Sjogren's, bilateral: Secondary | ICD-10-CM | POA: Diagnosis not present

## 2022-02-26 NOTE — Progress Notes (Signed)
Please inform patient of the following:  Cholesterol looks much better. She should continue current treatment plan and should follow up with Dr Cathie Olden soon.  Algis Greenhouse. [name], MD 02/26/2022 1:06 PM

## 2022-02-26 NOTE — Telephone Encounter (Signed)
Please see message below

## 2022-02-27 ENCOUNTER — Encounter: Payer: Self-pay | Admitting: Family Medicine

## 2022-03-02 ENCOUNTER — Other Ambulatory Visit: Payer: Self-pay | Admitting: Family Medicine

## 2022-03-02 NOTE — Telephone Encounter (Signed)
Can we clarify what medication she is talking about? The only cholesterol medication I see on her list is '10mg'$  zetia.  Marland Kitchencmk

## 2022-03-02 NOTE — Telephone Encounter (Signed)
See note

## 2022-03-04 DIAGNOSIS — H02055 Trichiasis without entropian left lower eyelid: Secondary | ICD-10-CM | POA: Diagnosis not present

## 2022-03-04 DIAGNOSIS — H18891 Other specified disorders of cornea, right eye: Secondary | ICD-10-CM | POA: Diagnosis not present

## 2022-03-04 DIAGNOSIS — H16223 Keratoconjunctivitis sicca, not specified as Sjogren's, bilateral: Secondary | ICD-10-CM | POA: Diagnosis not present

## 2022-03-05 NOTE — Telephone Encounter (Signed)
Patient states: - Overall she has been taking zetia 10 mg daily but has noticed her cholesterol is still at 200.  - She wants to know if zetia dosage should be increased or should more time be given for the medication to bring down her cholesterol levels.  Patient requests: - A callback at [phone], her home phone, with an answer as soon as possible

## 2022-03-05 NOTE — Telephone Encounter (Signed)
See note

## 2022-03-06 NOTE — Telephone Encounter (Signed)
She is on the full dose of zetia. Recommend she follow up with Dr Cathie Olden soon to discuss other treatment options. Only other thing I could offer is a statin which I know she has not tolerated well in the past.

## 2022-03-09 DIAGNOSIS — H18891 Other specified disorders of cornea, right eye: Secondary | ICD-10-CM | POA: Diagnosis not present

## 2022-03-09 DIAGNOSIS — H18892 Other specified disorders of cornea, left eye: Secondary | ICD-10-CM | POA: Diagnosis not present

## 2022-03-09 DIAGNOSIS — H16223 Keratoconjunctivitis sicca, not specified as Sjogren's, bilateral: Secondary | ICD-10-CM | POA: Diagnosis not present

## 2022-03-09 DIAGNOSIS — H18893 Other specified disorders of cornea, bilateral: Secondary | ICD-10-CM | POA: Diagnosis not present

## 2022-03-10 NOTE — Telephone Encounter (Signed)
Pt called to check on er request to increase Zetia. Informed pt Per Dr. Marigene Ehlers last note "She is on the full dose of zetia. Recommend she follow up with Dr Cathie Olden soon to discuss other treatment options. Only other thing I could offer is a statin which I know she has not tolerated well in the past. " Pt stated that it been 2-3 years that she hasn't seen Dr. Cathie Olden and she wants to discuss her Cholesterol medication management with Dr. [name]. Scheduled pt to see Dr. [name] on 9/1 @ 9:20.

## 2022-03-11 ENCOUNTER — Other Ambulatory Visit: Payer: Self-pay | Admitting: Family Medicine

## 2022-03-13 ENCOUNTER — Ambulatory Visit: Payer: Medicare PPO | Admitting: Family Medicine

## 2022-03-13 ENCOUNTER — Encounter: Payer: Self-pay | Admitting: Family Medicine

## 2022-03-13 VITALS — BP 120/73 | HR 72 | Temp 97.1°F | Ht 60.0 in | Wt 133.0 lb

## 2022-03-13 DIAGNOSIS — I2510 Atherosclerotic heart disease of native coronary artery without angina pectoris: Secondary | ICD-10-CM | POA: Diagnosis not present

## 2022-03-13 DIAGNOSIS — H16223 Keratoconjunctivitis sicca, not specified as Sjogren's, bilateral: Secondary | ICD-10-CM | POA: Diagnosis not present

## 2022-03-13 DIAGNOSIS — H26491 Other secondary cataract, right eye: Secondary | ICD-10-CM | POA: Diagnosis not present

## 2022-03-13 DIAGNOSIS — E7800 Pure hypercholesterolemia, unspecified: Secondary | ICD-10-CM

## 2022-03-13 MED ORDER — FENOFIBRATE 48 MG PO TABS: 48.0000 mg | ORAL_TABLET | Freq: Every day | ORAL | 0 refills | Status: DC

## 2022-03-13 NOTE — Progress Notes (Signed)
   [name] is a 76 y.o. female who presents today for an office visit.  Assessment/Plan:  Chronic Problems Addressed Today: Hyperlipidemia Clarified med patient with patient.  She is no longer on Repatha.  We added on Zetia several months ago.  She is doing well with this.  Most recent lipid panel showed LDL of 103 and HDL of 70.  She would like to decrease her LDL if possible.  She has had bad reactions to statin the past.  We will add on fenofibrate.  We can recheck in 3 to 6 months.  CAD (coronary artery disease), native coronary artery We are treating her cholesterol as above.  Would ideally try to get her LDL less than 70.     Subjective:  HPI:  See A/p for status of chronic conditions.         Objective:  Physical Exam: BP 120/73   Pulse 72   Temp (!) 97.1 F (36.2 C)   Ht 5' (1.524 m)   Wt 133 lb (60.3 kg)   SpO2 92%   BMI 25.97 kg/m   Gen: No acute distress, resting comfortably Neuro: Grossly normal, moves all extremities Psych: Normal affect and thought content      Maxamus Colao M. [name], MD 03/13/2022 2:18 PM

## 2022-03-13 NOTE — Patient Instructions (Addendum)
It was very nice to see you today!  We will start fenofibrate. Please continue all of your other medications.   Please come back in 3 months to recheck your cholesterol levels.  Take care, Dr [name]  PLEASE NOTE:  If you had any lab tests please let us know if you have not heard back within a few days. You may see your results on mychart before we have a chance to review them but we will give you a call once they are reviewed by [place]. If we ordered any referrals today, please let us know if you have not heard from their office within the next week.   Please try these tips to maintain a healthy lifestyle:  Eat at least 3 REAL meals and 1-2 snacks per day.  Aim for no more than 5 hours between eating.  If you eat breakfast, please do so within one hour of getting up.   Each meal should contain half fruits/vegetables, one quarter protein, and one quarter carbs (no bigger than a computer mouse)  Cut down on sweet beverages. This includes juice, soda, and sweet tea.   Drink at least 1 glass of water with each meal and aim for at least 8 glasses per day  Exercise at least 150 minutes every week.

## 2022-03-13 NOTE — Assessment & Plan Note (Signed)
Clarified med patient with patient.  She is no longer on Repatha.  We added on Zetia several months ago.  She is doing well with this.  Most recent lipid panel showed LDL of 103 and HDL of 70.  She would like to decrease her LDL if possible.  She has had bad reactions to statin the past.  We will add on fenofibrate.  We can recheck in 3 to 6 months.

## 2022-03-13 NOTE — Assessment & Plan Note (Signed)
We are treating her cholesterol as above.  Would ideally try to get her LDL less than 70.

## 2022-03-13 NOTE — Telephone Encounter (Signed)
See note

## 2022-03-21 NOTE — Telephone Encounter (Signed)
FYI  Pt > 90 days past due for PROLIA injection.   If you would like for pt to continue with Prolia therapy, please have clinical staff reach out to pt for scheduling and to explain to importance of receiving Prolia injections every 6 months as abrupt cessation of Prolia raises risk of osteoporotic fracture.    "Discontinuation of Dmab is associated with a 3- to 5-fold higher risk for vertebral, major osteoporotic, and hip fractures [38,39]."   [email or web address]

## 2022-03-22 NOTE — Telephone Encounter (Signed)
T, can you please see what is going on? I have not seen her in almost 3 years!!! She missed last Prolia in June, I believe. PCP encouraged her to continue to get it through [place]... Ty! C

## 2022-03-24 NOTE — Telephone Encounter (Signed)
Both.  The injection is priority, but she needs an appointment with me, later in the year, but hopefully this year.

## 2022-03-25 NOTE — Telephone Encounter (Signed)
Pt scheduled for Prolia appt nxt week. Pt advised she will schedule follow up appt when she comes in for injection.

## 2022-04-01 ENCOUNTER — Ambulatory Visit (INDEPENDENT_AMBULATORY_CARE_PROVIDER_SITE_OTHER): Payer: Medicare PPO

## 2022-04-01 DIAGNOSIS — M810 Age-related osteoporosis without current pathological fracture: Secondary | ICD-10-CM | POA: Diagnosis not present

## 2022-04-01 MED ORDER — DENOSUMAB 60 MG/ML ~~LOC~~ SOSY
60.0000 mg | PREFILLED_SYRINGE | Freq: Once | SUBCUTANEOUS | Status: AC
  Administered 2022-04-01: 60 mg via SUBCUTANEOUS

## 2022-04-01 NOTE — Progress Notes (Signed)
Patient verbally confirmed name, date of birth, and correct medication to be administered. Prolia injection administered and pt tolerated well.  

## 2022-04-05 ENCOUNTER — Other Ambulatory Visit: Payer: Self-pay | Admitting: Family Medicine

## 2022-04-06 ENCOUNTER — Encounter: Payer: Self-pay | Admitting: Internal Medicine

## 2022-04-06 ENCOUNTER — Ambulatory Visit: Payer: Medicare PPO | Admitting: Internal Medicine

## 2022-04-06 VITALS — BP 100/68 | HR 79 | Ht 60.0 in | Wt 131.0 lb

## 2022-04-06 DIAGNOSIS — M810 Age-related osteoporosis without current pathological fracture: Secondary | ICD-10-CM | POA: Diagnosis not present

## 2022-04-06 DIAGNOSIS — E559 Vitamin D deficiency, unspecified: Secondary | ICD-10-CM

## 2022-04-06 LAB — BASIC METABOLIC PANEL
BUN: 27 mg/dL — ABNORMAL HIGH (ref 6–23)
CO2: 27 mEq/L (ref 19–32)
Calcium: 9.2 mg/dL (ref 8.4–10.5)
Chloride: 106 mEq/L (ref 96–112)
Creatinine, Ser: 1.22 mg/dL — ABNORMAL HIGH (ref 0.40–1.20)
GFR: 43.74 mL/min — ABNORMAL LOW (ref >60.00)
Glucose, Bld: 116 mg/dL — ABNORMAL HIGH (ref 70–99)
Potassium: 4.4 mEq/L (ref 3.5–5.1)
Sodium: 141 mEq/L (ref 135–145)

## 2022-04-06 LAB — VITAMIN D 25 HYDROXY (VIT D DEFICIENCY, FRACTURES): VITD: 49.96 ng/mL (ref 30.00–100.00)

## 2022-04-06 NOTE — Progress Notes (Signed)
Patient ID: [name], female   DOB: Jul 08, 1946, 76 y.o.   MRN: [id number]   HPI  [name] is a 76 y.o.-year-old female, initially referred by her PCP, Dr. Leighton Ruff, returning for follow-up for osteoporosis (OP).  I previously saw the patient - in 08/2015 for an elevated free T4.  Last visit almost 3 years ago.  Interim history: No falls or fractures since last visit. No dizziness/vertigo/orthostasis/poor vision. She has low back pain presumed from scoliosis.  Reviewed history: Pt was dx with OP in 2016.  Reviewed DEXA scan reports: Date L1-L3 T score FN T score 33% distal Radius  10/27/2021 ([facility]) -1.4 RFN: -2.6 LFN: -2.6 N/a  05/19/2019 ([facility])  -2.2 RFN: -2.8 LFN: -2.7 n/a  05/03/2017 ([facility])  -2.2 (-2.4 for comparison with 2020) RFN: -2.6 LFN: -2.9 n/a  01/17/2015 ([facility])  -1.8 RFN: -2.4 LFN: -2.6 n/a  2014  -1.8 RFN: -2.3 LFN: -2.4 n/a   She has a history of fractures of L tibia-fibula when stepping off a treadmill 02/2017 (had a rod placed) and also nasal bones at the same time.  Previous OP treatments:  - Fosamax 70 mg weekly off and on for 20 years - Prolia: She feels that this is working well for her. 11/14/2019 05/31/2020 12/04/2020 06/17/2021 04/01/2022  At last visit, we checked her labs and her kidney function was still low (GFR 47), calcium and PTH levels were normal, vitamin D was normal, phosphorus slightly high, N-telopeptide suppressed: Component     Latest Ref Rng & Units 06/23/2019  Glucose     65 - 99 mg/dL 91  BUN     7 - 25 mg/dL 32 (H)  Creatinine     0.60 - 0.93 mg/dL 1.17 (H)  GFR, Est Non African American     > OR = 60 mL/min/1.54m 47 (L)  Calcium     8.6 - 10.4 mg/dL 10.1  VITD     30.00 - 100.00 ng/mL 53.73  PTH, Intact     15 - 65 pg/mL 16  Phosphorus     2.3 - 4.6 mg/dL 4.7 (H)  NTX Telopeptide     6.2 - 19.0 nmol BCE/L 6.3   Component     Latest Ref Rng 06/23/2019  NTX Telopeptide     6.2 - 19.0 nmol BCE/L 6.3     She has a history of vitamin D deficiency, but most recent levels were normal: Lab Results  Component Value Date   VD25OH 56.20 08/27/2021   VD25OH 42.65 02/11/2021   VD25OH 53.73 06/23/2019   VD25OH 41.72 08/19/2015  02/16/2019: Vitamin D 41.74 04/15/2018: Vitamin D 31.4  She is on vitamin D 1000 (she thinks) units daily.  No weight bearing exercises, but walks 4-5 times a week 2 miles a day with her husband.  She was getting steroid injections every 6 months in spine (DDD, spinal stenosis)-for years and stopped in 2017.  She does not take high vitamin A doses.  Menopause was at 76years old.  She did not have HRT.  No FH of osteoporosis.  No hyper or hypocalcemia or hyperparathyroidism.  No history of kidney stones. Lab Results  Component Value Date   PTH 16 06/23/2019   CALCIUM 9.7 08/27/2021   CALCIUM 9.9 02/11/2021   CALCIUM 9.5 07/23/2020   CALCIUM 10.1 06/23/2019   CALCIUM 9.3 07/27/2017   CALCIUM 9.9 06/01/2017   CALCIUM 8.9 03/08/2017   CALCIUM 10.0 02/24/2017   CALCIUM 9.6 12/01/2016  CALCIUM 9.8 10/26/2016   24-hour urine calcium: Component     Latest Ref Rng 07/03/2019  Creatinine, 24H Ur     0.50 - 2.15 g/24 h 0.78   Calcium, 24H Urine     mg/24 h 81    TFTs have been normal:  Lab Results  Component Value Date   TSH 1.56 08/27/2021   TSH 1.15 02/11/2021   TSH 0.67 08/19/2015   TSH 0.94 08/23/2012   TSH 0.56 08/03/2011   She has CKD stage III. Last BUN/Cr: Lab Results  Component Value Date   BUN 23 08/27/2021   CREATININE 1.20 08/27/2021  11/11/2018: 38/1.13, GFR 47  ROS: + see HPI  I reviewed pt's medications, allergies, PMH, social hx, family hx, and changes were documented in the history of present illness. Otherwise, unchanged from my initial visit note.  Past Medical History:  Diagnosis Date   Anxiety    Arthritis    "back, neck" (03/08/2017)   Asthma in child    Bowel obstruction ([place])    CAD (coronary artery disease), native  coronary artery    Chronic lower back pain    Chronic neck pain    Closed left tibial fracture    Colon polyps    Complication of anesthesia    "I was awake during one of my surgeries" reports that she felt them start the incision and heard them talking reports happened ~ 35 years ago     Depression    Diverticulosis    Hyperlipidemia    Irritable bowel syndrome    Migraine    "used to be severe; now maybe 1/year or 2" (03/08/2017)   Pneumonia    H!N!   Past Surgical History:  Procedure Laterality Date   ANKLE HARDWARE REMOVAL Left 07/27/2017   Hardware removal left ankle, takedown non-union with allograft bone graft, Intramedullary Nail (Left)   APPENDECTOMY     AUGMENTATION MAMMAPLASTY Bilateral    BRACHIOPLASTY Bilateral    Arm Lift    CARDIAC CATHETERIZATION     CHOLECYSTECTOMY OPEN     COLON SURGERY     EXTERNAL FIXATION LEG Left 03/08/2017   Procedure: INTERNAL FIXATION LEFT ANKLE;  Surgeon: Newt Minion, MD;  Location: [facility];  Service: Orthopedics;  Laterality: Left;   EYE MUSCLE SURGERY Bilateral X 2   "lazy eye; OR twice on each eye; Dr. Annamaria Boots"   [place] Left 07/27/2017   Procedure: HARDWARE REMOVAL;  Surgeon: Mcarthur Rossetti, MD;  Location: [facility];  Service: Orthopedics;  Laterality: Left;   LEFT HEART CATH AND CORONARY ANGIOGRAPHY N/A 10/27/2016   Procedure: Left Heart Cath and Coronary Angiography;  Surgeon: Peter M Martinique, MD;  Location: [facility];  Service: Cardiovascular;  Laterality: N/A;   LEFT HEART CATHETERIZATION WITH CORONARY ANGIOGRAM N/A 08/16/2014   Procedure: LEFT HEART CATHETERIZATION WITH CORONARY ANGIOGRAM;  Surgeon: Sinclair Grooms, MD;  Location: [facility];  Service: Cardiovascular;  Laterality: N/A;   NASAL SINUS SURGERY     ORIF ANKLE FRACTURE Left 03/08/2017   ORIF ANKLE FRACTURE Left 07/27/2017   Procedure: Hardware removal left ankle, takedown non-union with allograft bone graft, Intramedullary  Nail;  Surgeon: Mcarthur Rossetti, MD;  Location: [facility];  Service: Orthopedics;  Laterality: Left;   PARTIAL COLECTOMY  1970s   TONSILLECTOMY     Social History   Socioeconomic History   Marital status: Married    Spouse name: Not on  file   Number of children: 2   Years of education: Not on file   Highest education level: Bachelor's degree (e.g., BA, AB, BS)  Occupational History   Occupation: Retired Merchant navy officer)     Employer: RETIRED  Tobacco Use   Smoking status: Former    Packs/day: 1.00    Years: 41.00    Total pack years: 41.00    Types: Cigarettes    Quit date: 01/10/2010    Years since quitting: 12.2   Smokeless tobacco: Never  Vaping Use   Vaping Use: Former  Substance and Sexual Activity   Alcohol use: No   Drug use: No   Sexual activity: Not Currently    Partners: Female  Other Topics Concern   Not on file  [place]   Married '72-16 yrs/divorce; married '92   1 grandchild   Niece (jamie-Madeline's daughter, with 3 children and full time Ship broker)   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   Marriage in good health.      Physician roster   Gyn- Helene Shoe, NP   Opthal- Dr Annamaria Boots (pediatric opthal)   GI- dr Adriana Simas- Dr Gladstone Lighter   ENT- Dr Ernesto Rutherford            Social Determinants of Health   Financial Resource Strain: Low Risk  (08/05/2021)   Overall Financial Resource Strain (CARDIA)    Difficulty of Paying Living Expenses: Not hard at all  Food Insecurity: No Food Insecurity (08/05/2021)   Hunger Vital Sign    Worried About [place] in the Last Year: Never true    [place] in the Last Year: Never true  Transportation Needs: No Transportation Needs (08/05/2021)   PRAPARE - Transportation    Lack of Transportation (Medical): No    Lack of Transportation (Non-Medical): No  Physical Activity: Sufficiently Active (08/05/2021)   Exercise Vital Sign    Days of Exercise per Week: 5 days     Minutes of Exercise per Session: 50 min  Stress: No Stress Concern Present (08/05/2021)   [place]    Feeling of Stress : Only a little  Social Connections: Moderately Isolated (08/05/2021)   Social Connection and Isolation Panel [NHANES]    Frequency of Communication with Friends and Family: More than three times a week    Frequency of Social Gatherings with Friends and Family: More than three times a week    Attends Religious Services: Never    Marine scientist or Organizations: No    Attends Archivist Meetings: Never    Marital Status: Married  Human resources officer Violence: Not At Risk (08/05/2021)   Humiliation, Afraid, Rape, and Kick questionnaire    Fear of Current or Ex-Partner: No    Emotionally Abused: No    Physically Abused: No    Sexually Abused: No   Current Outpatient Medications on File Prior to Visit  Medication Sig Dispense Refill   ALPRAZolam (XANAX) 0.5 MG tablet Take 1 tablet by mouth twice daily 60 tablet 0   ezetimibe (ZETIA) 10 MG tablet Take 1 tablet (10 mg total) by mouth daily. 90 tablet 3   fenofibrate (TRICOR) 48 MG tablet Take 1 tablet (48 mg total) by mouth daily. 90 tablet 0   FLUoxetine (PROZAC) 40 MG capsule Take 1 capsule by mouth once daily 90 capsule 0   mirabegron ER (MYRBETRIQ) 25 MG TB24  tablet Take 1 tablet (25 mg total) by mouth daily. 30 tablet 5   tetrahydrozoline 0.05 % ophthalmic solution      traZODone (DESYREL) 50 MG tablet TAKE 1 TABLET BY MOUTH AT BEDTIME 90 tablet 0   triamcinolone cream (KENALOG) 0.1 % Apply 1 application topically 2 (two) times daily.     No current facility-administered medications on file prior to visit.   Allergies  Allergen Reactions   Shellfish-Derived Products Anaphylaxis, Shortness Of Breath and Swelling    Throat swells; Iodine is ok   Lipitor [Atorvastatin] Nausea Only and Other (See Comments)    Fatigue and Lethargy. Leg,  back,Joint, and muscle pain. Per patient "My stool was a muddy clay color and I having diarrhea" pt states "I never want to take another statin again"   Crestor [Rosuvastatin Calcium]     UNSPECIFIED REACTION    Evolocumab Other (See Comments)    Other reaction(s): rash   Metoprolol     Other reaction(s): very tired, blurred vision   Pravastatin     fatigue   Prednisone Other (See Comments)    Other reaction(s): insomnia, shakes   Zetia [Ezetimibe]     fatigue   Codeine Nausea And Vomiting   Family History  Problem Relation Age of Onset   Heart disease Mother        CHF   Coronary artery disease Mother    Hypertension Mother    Hyperlipidemia Mother    Heart disease Father        CHF   Coronary artery disease Father    Diabetes Sister        with all the complications   Coronary artery disease Sister    Stroke Sister 41       CVA   Peripheral vascular disease Sister    Colon cancer Maternal Grandfather    Healthy Son    Liver disease Son        liver failure    Cancer Neg Hx        breast or colon   PE: BP 100/68 (BP Location: Left Arm, Patient Position: Sitting, Cuff Size: Normal)   Pulse 79   Ht 5' (1.524 m)   Wt 131 lb (59.4 kg)   SpO2 93%   BMI 25.58 kg/m  Wt Readings from Last 3 Encounters:  04/06/22 131 lb (59.4 kg)  03/13/22 133 lb (60.3 kg)  11/27/21 131 lb (59.4 kg)   Constitutional: normal weight, in NAD Eyes:  EOMI, no exophthalmos ENT: no neck masses, no cervical lymphadenopathy Cardiovascular: RRR, No MRG Respiratory: CTA B Musculoskeletal: no deformities Skin:no rashes Neurological: no tremor with outstretched hands  Assessment: 1. Osteoporosis  2.  Vitamin D deficiency  Plan: 1. Osteoporosis -Returning after almost 3 years absence -Likely postmenopausal/age-related (she has a history of early menopause without HRT), also, possibly steroid-induced (history of spinal steroid injections for many years - stopped in 2017) -Reviewed her  latest bone density from 10/27/2021 [facility]): Her spine bone density improved significantly: From -2.2 to -1.4.  Femoral neck bone densities were -2.6. -She does have a history of fragility fracture in 2019 and this was slow to heal -Her vitamin D level was normal and we will repeat this today.  She is taking a vit D supplement. -We discussed about starting Forteo or Tymlos, which I suggested since she did not have a history of hypercalcemia/hyperparathyroidism or history of radiation therapy, but we ended up starting Prolia.  She has been getting this  consistently - last dose 04/01/2022.  She feels that this is working well for her and would like to continue. She tolerates it well. without jaw/thigh/hip pain -Discussed about the possibility to continue for up to 10 years if needed but studies go beyond this time interval - will check a new DXA scan in 2 years from the previous-unchanged or higher T-scores are desirable  - I we will see her back in 1 year  2.  Vitamin D deficiency -Latest vitamin D level was normal in 08/2021 -She continues on 1000 units vitamin D daily  -We will recheck her vitamin D level today  Component     Latest Ref Rng 04/06/2022  Glucose     70 - 99 mg/dL 116 (H)   BUN     6 - 23 mg/dL 27 (H)   Creatinine     0.40 - 1.20 mg/dL 1.22 (H)   Sodium     135 - 145 mEq/L 141   Potassium     3.5 - 5.1 mEq/L 4.4   Chloride     96 - 112 mEq/L 106   CO2     19 - 32 mEq/L 27   Calcium     8.4 - 10.5 mg/dL 9.2   VITD     30.00 - 100.00 ng/mL 49.96   GFR     >60.00 mL/min 43.74 (L)     GFR is low, consistent with prior.  Vitamin D and calcium levels are normal  Philemon Kingdom, MD PhD [facility] Endocrinology

## 2022-04-06 NOTE — Patient Instructions (Signed)
Please continue Prolia.  Please stop at the lab.  You should have an endocrinology follow-up appointment in 1 year.

## 2022-04-14 ENCOUNTER — Other Ambulatory Visit: Payer: Self-pay | Admitting: Family Medicine

## 2022-04-23 NOTE — Telephone Encounter (Signed)
Last Prolia inj 04/01/22 Next Prolia inj due 10/01/22

## 2022-04-27 DIAGNOSIS — H26491 Other secondary cataract, right eye: Secondary | ICD-10-CM | POA: Diagnosis not present

## 2022-05-24 ENCOUNTER — Other Ambulatory Visit: Payer: Self-pay | Admitting: Family Medicine

## 2022-06-05 ENCOUNTER — Other Ambulatory Visit: Payer: Self-pay | Admitting: Family Medicine

## 2022-06-24 ENCOUNTER — Telehealth: Payer: Self-pay | Admitting: Family Medicine

## 2022-06-24 ENCOUNTER — Encounter: Payer: Self-pay | Admitting: Internal Medicine

## 2022-06-24 ENCOUNTER — Ambulatory Visit: Payer: Medicare PPO | Admitting: Internal Medicine

## 2022-06-24 VITALS — BP 110/68 | HR 77 | Temp 97.3°F | Resp 16 | Ht 61.0 in | Wt 129.0 lb

## 2022-06-24 DIAGNOSIS — R509 Fever, unspecified: Secondary | ICD-10-CM

## 2022-06-24 DIAGNOSIS — U071 COVID-19: Secondary | ICD-10-CM | POA: Diagnosis not present

## 2022-06-24 DIAGNOSIS — R051 Acute cough: Secondary | ICD-10-CM

## 2022-06-24 DIAGNOSIS — J029 Acute pharyngitis, unspecified: Secondary | ICD-10-CM | POA: Diagnosis not present

## 2022-06-24 DIAGNOSIS — R6889 Other general symptoms and signs: Secondary | ICD-10-CM | POA: Diagnosis not present

## 2022-06-24 LAB — POCT RAPID STREP A (OFFICE): Rapid Strep A Screen: POSITIVE — AB

## 2022-06-24 LAB — POCT INFLUENZA A/B
Influenza A, POC: NEGATIVE
Influenza B, POC: NEGATIVE

## 2022-06-24 LAB — POC COVID19 BINAXNOW: SARS Coronavirus 2 Ag: POSITIVE — AB

## 2022-06-24 MED ORDER — NIRMATRELVIR/RITONAVIR (PAXLOVID) TABLET (RENAL DOSING): 2.0000 | ORAL_TABLET | Freq: Two times a day (BID) | ORAL | 0 refills | Status: AC

## 2022-06-24 MED ORDER — NIRMATRELVIR/RITONAVIR (PAXLOVID)TABLET: 3.0000 | ORAL_TABLET | Freq: Two times a day (BID) | ORAL | 0 refills | Status: DC

## 2022-06-24 NOTE — Telephone Encounter (Signed)
Caller states: -Headaches are intermittent, cough is worsening and has been onset for 3-4 days - Determination is for patient to be seen within 4 hours   Patient was then transferred to me and has been scheduled with Dr. Randol Kern on 06/24/22 @ 2pm.

## 2022-06-24 NOTE — Progress Notes (Unsigned)
[facility]: [phone]   Acute Care Medical Office Visit  Patient:  [name]      Age: 76 y.o.       Sex:  female  Date:   06/25/2022  PCP:    Vivi Barrack, MD   [place] Provider: Loralee Pacas, MD  Assessment/Plan:     ICD-10-CM   1. COVID  U07.1 DISCONTINUED: nirmatrelvir/ritonavir EUA (PAXLOVID) 20 x 150 MG & 10 x '100MG'$  TABS    DISCONTINUED: nirmatrelvir/ritonavir EUA (PAXLOVID) 20 x 150 MG & 10 x '100MG'$  TABS    2. Flu-like symptoms  R68.89 POCT Influenza A/B    3. Sore throat  J02.9 POCT rapid strep A    4. Acute cough  R05.1 POC COVID-19    5. Fever, unspecified fever cause  R50.9 POC COVID-19      Sore throat is minimal while systemic symptoms are severe (so it seems viral) and strep swab faintly positive so I'm not going to treat the likely false positive strep test.  Treatment:  Paxlovid  Hydration Aggressive sinus rinses preferably with misting saline can spra Tylenol 650 mg every 6 hours to help with sinus pressure - was advised no NSAIDs due to ckd3  Infection Control: We discussed the importance of daily sinus rinsing, hand hygiene, cough hygiene, masks, and quarantining to mitigate the spread of illness.  Follow up as needed, based on symptoms and resolution, go to ER if any difficulty catching breath, very high fevers    Subjective:   [name] is a 76 y.o. female with past medical history significant for Patient Active Problem List   Diagnosis Date Noted   Chronic low back pain 11/25/2021   Urge incontinence 02/11/2021   Osteoporosis 05/31/2019   Statin myopathy 05/31/2019   Essential tremor 05/31/2019   History of colonic polyps 05/31/2019   Stage 3 chronic kidney disease ([place]) 05/31/2019   Vitamin D deficiency, unspecified 05/31/2019   CAD (coronary artery disease), native coronary artery    Anxiety    Depression    Hyperlipidemia     Reviewed prior medications as listed: Outpatient  Medications Prior to Visit  Medication Sig   ALPRAZolam (XANAX) 0.5 MG tablet Take 1 tablet by mouth twice daily   ezetimibe (ZETIA) 10 MG tablet Take 1 tablet by mouth once daily   fenofibrate (TRICOR) 48 MG tablet Take 1 tablet by mouth once daily   FLUoxetine (PROZAC) 40 MG capsule Take 1 capsule by mouth once daily   mirabegron ER (MYRBETRIQ) 25 MG TB24 tablet Take 1 tablet (25 mg total) by mouth daily.   traZODone (DESYREL) 50 MG tablet TAKE 1 TABLET BY MOUTH AT BEDTIME   triamcinolone cream (KENALOG) 0.1 % Apply 1 application topically 2 (two) times daily.   tetrahydrozoline 0.05 % ophthalmic solution  (Patient not taking: Reported on 06/24/2022)   No facility-administered medications prior to visit.     She presented today for evaluation of: Chief Complaint  Patient presents with   Headache   Fever   Cough   Sore Throat     HPI  -other symptoms include: woke up 3 days ago Sunday night with fever chills throughout next day and night with headache sore throat tight chest congestion myalgia(s), fevers of 101.8.   weakness -first symptoms of this illness appeared 4 days ago -Symptoms are worsening -previous treatments: tylenol mucinex,  -sick contacts/travel/risks: denies flu/COVID exposure.  -Hx of: allergies, feels similar to her experience with  h1n1  ROS - , shortness of breath with stairs only, nausea, but denies, vomiting, diarrhea, tooth pain        Objective:  BP 110/68 (BP Location: Left Arm, Patient Position: Sitting)   Pulse 77   Temp (!) 97.3 F (36.3 C) (Temporal)   Resp 16   Ht '5\' 1"'$  (1.549 m)   Wt 129 lb (58.5 kg)   SpO2 96%   BMI 24.37 kg/m  Physical Exam was problem focused only:   Overweight female in no acute distress ,  awake alert and oriented, normal work of breathing,  lungs were clear to auscultation bilaterally Heart was Normal heart rate. Normal rhythm. No murmurs, rubs, or gallops.  Problem-specific findings:  stuffy sounding, wearing  mask   uncomfortable but and very fatigued & ill-appearing.. but very clear headed with no difficulty breathing, no diaphoresis, no balance issues with standing although she is slow to stand   Results Reviewed: Results for orders placed or performed in visit on 06/24/22  POC COVID-19  Result Value Ref Range   SARS Coronavirus 2 Ag Positive (A) Negative  POCT Influenza A/B  Result Value Ref Range   Influenza A, POC Negative Negative   Influenza B, POC Negative Negative  POCT rapid strep A  Result Value Ref Range   Rapid Strep A Screen Positive (A) Negative      Loralee Pacas, MD

## 2022-06-24 NOTE — Telephone Encounter (Signed)
Caller states: -Patient has been experiencing sore throat, weakness, headache, chest congestion, cough, and fever  - While on the phone fever increased from 100.2 to 100.8  - He has been giving patient mucinex and tylenol  - Pt hasn't been tested for COVID   Patient is beside caller. They have been transferred to triage.

## 2022-06-24 NOTE — Patient Instructions (Addendum)
You have COVID you need to be careful and quarantine and drink lots of fluid intake no ibuprofen or Aleve.  Liquid IV and saline rinses with simply saline from arm and Hammer are strongly recommended but popsicles and chicken noodle soup can also be a very helpful.  If you get short of breath or dizzy or just really high fevers or really sick cannot hold down anything and you need to go to the emergency room.  I do not see any medication interactions that you need to worry about and you should be okay to take any symptomatic therapies that you want that are not related to ibuprofen or Aleve

## 2022-06-24 NOTE — Telephone Encounter (Signed)
Caller is Chiropodist, Education administrator from [employer] states: -They need clarification prior to filling Paxlovid  - Initially one form of paxlovid was sent in with a 30 day supply. This was later changed due to patient's hx of kidney disease  - Although the note was added about patient's kidney disease and the name of the medication was changed, the dosage and instructions didn't match what the new prescriptions usual supply is  [name] can be reached at [phone].

## 2022-06-25 NOTE — Telephone Encounter (Signed)
Please verified Rx

## 2022-06-26 NOTE — Telephone Encounter (Signed)
She saw Dr Randol Kern for this. She should be on renally dosed paxlovid.   Algis Greenhouse. [name], MD 06/26/2022 11:07 AM

## 2022-06-29 ENCOUNTER — Other Ambulatory Visit: Payer: Self-pay | Admitting: Family Medicine

## 2022-07-01 ENCOUNTER — Ambulatory Visit: Payer: Medicare PPO | Admitting: Family

## 2022-07-02 ENCOUNTER — Ambulatory Visit: Payer: Medicare PPO | Admitting: Family

## 2022-07-02 ENCOUNTER — Encounter: Payer: Self-pay | Admitting: Family

## 2022-07-02 VITALS — BP 110/66 | HR 71 | Temp 98.8°F | Ht 61.0 in | Wt 127.5 lb

## 2022-07-02 DIAGNOSIS — U071 COVID-19: Secondary | ICD-10-CM

## 2022-07-02 DIAGNOSIS — F4321 Adjustment disorder with depressed mood: Secondary | ICD-10-CM

## 2022-07-02 MED ORDER — FLUOXETINE HCL 20 MG PO CAPS: 20.0000 mg | ORAL_CAPSULE | Freq: Every day | ORAL | 3 refills | Status: DC

## 2022-07-02 NOTE — Progress Notes (Signed)
Patient ID: RYIN SCHILLO, female    DOB: 10-31-1945, 76 y.o.   MRN: [id number]  Chief Complaint  Patient presents with   Depression    Pt c/o states she has had death in her family and would like to discuss medications, currently taking Xanax daily and Prozac.   Covid Positive    Pt was diagnosed with covid a week ago. Pt c/o weakness, fatigue and SOB at night.     HPI:      Post covid sx:  sx started she thinks about 2 weeks ago, reports taking the antiviral but thinks it was too late to be beneficial. Still feeling fatigued, coughing, nasal drainage and congestion, not sleeping well. Taking Mucinex with minor relief, also took OTC Sinus max which is not helping.  Depression/Grief:  son passed away on 06-14-23, pt having a very hard time with her grief, husband also present & states she is crying daily and shaking. She does have some mild daily tremors, but when she is stressed it is exaggerated. She is taking Prozac '40mg'$  qd (taken for years) and Xanax up from qd to bid.   Assessment & Plan:  1. Grief reaction - pt very despondent during visit, difficulty getting her words out, husband gave most of information. Pt reports increasing her Prozac to '60mg'$  in past when she had an increase in depression and then reduced her dose when better. Advised I will send in an extra '20mg'$  she can start today and take for at least the next 3 months, then can taper if doing better. OK to take 1 Xanax with 1 Benadryl or Trazodone to help with sleep but not all 3 together. Pt and husband present VU. Also encouraged pt to participate in grief counseling offered by Hospice, pt states they will resume this after the holiday. Emergency # provided for mental health and [facility] if needed prior to starting counseling.  - FLUoxetine (PROZAC) 20 MG capsule; Take 1 capsule (20 mg total) by mouth daily.  Dispense: 90 capsule; Refill: 3  2. COVID-19 - reports being positive about 8 days ago, but sx started a  week prior. Pt also very depressed d/t losing her son and this is aggravating her sx & immunity. Advised pt to continue using the nasal saline spray, can try OTC generic Benadryl to help with sleep at night and sinus sx. Advised to only take this with 1 Xanax OR 1 Trazodone, but not all 3 together. Continue daily OTC sinus meds, increase fluids daily.  Subjective:    Outpatient Medications Prior to Visit  Medication Sig Dispense Refill   ALPRAZolam (XANAX) 0.5 MG tablet Take 1 tablet by mouth twice daily (Patient taking differently: Take 0.5 mg by mouth daily.) 60 tablet 0   ezetimibe (ZETIA) 10 MG tablet Take 1 tablet by mouth once daily 90 tablet 0   fenofibrate (TRICOR) 48 MG tablet Take 1 tablet by mouth once daily 90 tablet 0   FLUoxetine (PROZAC) 40 MG capsule Take 1 capsule by mouth once daily 90 capsule 0   mirabegron ER (MYRBETRIQ) 25 MG TB24 tablet Take 1 tablet (25 mg total) by mouth daily. 30 tablet 5   tetrahydrozoline 0.05 % ophthalmic solution      traZODone (DESYREL) 50 MG tablet TAKE 1 TABLET BY MOUTH AT BEDTIME 90 tablet 0   triamcinolone cream (KENALOG) 0.1 % Apply 1 application topically 2 (two) times daily.     No facility-administered medications prior to visit.  Past Medical History:  Diagnosis Date   Anxiety    Arthritis    "back, neck" (03/08/2017)   Asthma in child    Bowel obstruction ([place])    CAD (coronary artery disease), native coronary artery    Chronic lower back pain    Chronic neck pain    Closed left tibial fracture    Colon polyps    Complication of anesthesia    "I was awake during one of my surgeries" reports that she felt them start the incision and heard them talking reports happened ~ 35 years ago     Depression    Diverticulosis    Hyperlipidemia    Irritable bowel syndrome    Migraine    "used to be severe; now maybe 1/year or 2" (03/08/2017)   Pneumonia    H!N!   Past Surgical History:  Procedure Laterality Date   ANKLE HARDWARE  REMOVAL Left 07/27/2017   Hardware removal left ankle, takedown non-union with allograft bone graft, Intramedullary Nail (Left)   APPENDECTOMY     AUGMENTATION MAMMAPLASTY Bilateral    BRACHIOPLASTY Bilateral    Arm Lift    CARDIAC CATHETERIZATION     CHOLECYSTECTOMY OPEN     COLON SURGERY     EXTERNAL FIXATION LEG Left 03/08/2017   Procedure: INTERNAL FIXATION LEFT ANKLE;  Surgeon: Newt Minion, MD;  Location: [facility];  Service: Orthopedics;  Laterality: Left;   EYE MUSCLE SURGERY Bilateral X 2   "lazy eye; OR twice on each eye; Dr. Annamaria Boots"   [place] Left 07/27/2017   Procedure: HARDWARE REMOVAL;  Surgeon: Mcarthur Rossetti, MD;  Location: [facility];  Service: Orthopedics;  Laterality: Left;   LEFT HEART CATH AND CORONARY ANGIOGRAPHY N/A 10/27/2016   Procedure: Left Heart Cath and Coronary Angiography;  Surgeon: Peter M Martinique, MD;  Location: [facility];  Service: Cardiovascular;  Laterality: N/A;   LEFT HEART CATHETERIZATION WITH CORONARY ANGIOGRAM N/A 08/16/2014   Procedure: LEFT HEART CATHETERIZATION WITH CORONARY ANGIOGRAM;  Surgeon: Sinclair Grooms, MD;  Location: [facility] C[facility];  Service: Cardiovascular;  Laterality: N/A;   NASAL SINUS SURGERY     ORIF ANKLE FRACTURE Left 03/08/2017   ORIF ANKLE FRACTURE Left 07/27/2017   Procedure: Hardware removal left ankle, takedown non-union with allograft bone graft, Intramedullary Nail;  Surgeon: Mcarthur Rossetti, MD;  Location: [facility];  Service: Orthopedics;  Laterality: Left;   PARTIAL COLECTOMY  1970s   TONSILLECTOMY     Allergies  Allergen Reactions   Shellfish-Derived Products Anaphylaxis, Shortness Of Breath and Swelling    Throat swells; Iodine is ok   Lipitor [Atorvastatin] Nausea Only and Other (See Comments)    Fatigue and Lethargy. Leg, back,Joint, and muscle pain. Per patient "My stool was a muddy clay color and I having diarrhea" pt states "I never want to take another statin again"    Crestor [Rosuvastatin Calcium]     UNSPECIFIED REACTION    Evolocumab Other (See Comments)    Other reaction(s): rash   Metoprolol     Other reaction(s): very tired, blurred vision   Pravastatin     fatigue   Prednisone Other (See Comments)    Other reaction(s): insomnia, shakes   Zetia [Ezetimibe]     fatigue   Codeine Nausea And Vomiting      Objective:    Physical Exam Vitals and nursing note reviewed.  Constitutional:      Appearance: Normal  appearance.  Cardiovascular:     Rate and Rhythm: Normal rate and regular rhythm.  Pulmonary:     Effort: Pulmonary effort is normal.     Breath sounds: Normal breath sounds.  Musculoskeletal:        General: Normal range of motion.  Skin:    General: Skin is warm and dry.  Neurological:     Mental Status: She is alert.  Psychiatric:        Mood and Affect: Mood normal.        Behavior: Behavior normal.   BP 110/66 (BP Location: Left Arm, Patient Position: Sitting, Cuff Size: Large)   Pulse 71   Temp 98.8 F (37.1 C) (Temporal)   Ht '5\' 1"'$  (1.549 m)   Wt 127 lb 8 oz (57.8 kg)   SpO2 97%   BMI 24.09 kg/m  Wt Readings from Last 3 Encounters:  07/02/22 127 lb 8 oz (57.8 kg)  06/24/22 129 lb (58.5 kg)  04/06/22 131 lb (59.4 kg)       Jeanie Sewer, NP

## 2022-07-02 NOTE — Patient Instructions (Signed)
It was very nice to see you today!   Again, we are very sorry to hear about Corey's passing. Please be sure to follow up after the holiday regarding [facility] grief counseling services.  If you feel extremely depressed and need to talk with someone sooner, you can reach out to our Behavioral center at [phone] and they are located at [place] can also dial [phone] to speak to a mental health professional quickly if you are in crisis.  I have sent over the increased dose of your Prozac. You can start this today.  For your sinus and cough symptoms, try generic Benadryl over the counter at bedtime, start with 1 pill, ok to take with 1 Xanax, but not with Trazodone also. OR, you can take 1 Trazodone with 1 Benadryl. If still not sleeping, you can try 2 Benadryl with 1 Trazodone or 1 Xanax.  Keep drinking at least 2 liters of water every day, and use nasal saline spray several times a day to moisturize and disinfect.  Let us know if you are still not feeling better with your sinus symptoms by next week.    PLEASE NOTE:  If you had any lab tests please let us know if you have not heard back within a few days. You may see your results on MyChart before we have a chance to review them but we will give you a call once they are reviewed by [place]. If we ordered any referrals today, please let us know if you have not heard from their office within the next week.

## 2022-07-02 NOTE — Telephone Encounter (Signed)
Was seen for an OV with Dr.Morrison on 12/13.

## 2022-07-05 DIAGNOSIS — U071 COVID-19: Secondary | ICD-10-CM | POA: Diagnosis not present

## 2022-07-05 DIAGNOSIS — R059 Cough, unspecified: Secondary | ICD-10-CM | POA: Diagnosis not present

## 2022-07-05 DIAGNOSIS — R0602 Shortness of breath: Secondary | ICD-10-CM | POA: Diagnosis not present

## 2022-07-05 DIAGNOSIS — Z6824 Body mass index (BMI) 24.0-24.9, adult: Secondary | ICD-10-CM | POA: Diagnosis not present

## 2022-07-06 ENCOUNTER — Other Ambulatory Visit: Payer: Self-pay | Admitting: Family Medicine

## 2022-07-15 ENCOUNTER — Telehealth: Payer: Self-pay | Admitting: Family Medicine

## 2022-07-15 NOTE — Telephone Encounter (Signed)
Please advise 

## 2022-07-15 NOTE — Telephone Encounter (Signed)
Pt needs new orders for cholesterol screening. Please call pt back with details.

## 2022-07-16 NOTE — Telephone Encounter (Signed)
Ok to place order for lipid panel.   Algis Greenhouse. [name], MD 07/16/2022 2:59 PM

## 2022-07-17 ENCOUNTER — Other Ambulatory Visit: Payer: Self-pay

## 2022-07-17 DIAGNOSIS — E7800 Pure hypercholesterolemia, unspecified: Secondary | ICD-10-CM

## 2022-07-17 NOTE — Telephone Encounter (Signed)
Future Orders placed please contact pt with lab appt

## 2022-07-20 ENCOUNTER — Other Ambulatory Visit (INDEPENDENT_AMBULATORY_CARE_PROVIDER_SITE_OTHER): Payer: Medicare PPO

## 2022-07-20 DIAGNOSIS — E7800 Pure hypercholesterolemia, unspecified: Secondary | ICD-10-CM

## 2022-07-20 LAB — LIPID PANEL
Cholesterol: 171 mg/dL (ref 0–200)
HDL: 65.9 mg/dL (ref >39.00)
LDL Cholesterol: 94 mg/dL (ref 0–99)
NonHDL: 104.96
Total CHOL/HDL Ratio: 3
Triglycerides: 54 mg/dL (ref 0.0–149.0)
VLDL: 10.8 mg/dL (ref 0.0–40.0)

## 2022-07-22 ENCOUNTER — Other Ambulatory Visit (HOSPITAL_COMMUNITY): Payer: Self-pay

## 2022-07-22 NOTE — Progress Notes (Signed)
Please inform patient of the following:  Cholesterol levels are better than last time we checked.  She can continue current treatment plan and follow-up with cardiology as previously planned.

## 2022-07-24 ENCOUNTER — Ambulatory Visit: Payer: Medicare PPO | Admitting: Family Medicine

## 2022-07-24 ENCOUNTER — Encounter: Payer: Self-pay | Admitting: Family Medicine

## 2022-07-24 VITALS — BP 120/78 | HR 80 | Temp 98.2°F | Ht 61.0 in | Wt 126.2 lb

## 2022-07-24 DIAGNOSIS — F320 Major depressive disorder, single episode, mild: Secondary | ICD-10-CM | POA: Diagnosis not present

## 2022-07-24 DIAGNOSIS — F419 Anxiety disorder, unspecified: Secondary | ICD-10-CM | POA: Diagnosis not present

## 2022-07-24 DIAGNOSIS — E7800 Pure hypercholesterolemia, unspecified: Secondary | ICD-10-CM | POA: Diagnosis not present

## 2022-07-24 MED ORDER — AMOXICILLIN-POT CLAVULANATE 875-125 MG PO TABS: 1.0000 | ORAL_TABLET | Freq: Two times a day (BID) | ORAL | 0 refills | Status: DC

## 2022-07-24 NOTE — Patient Instructions (Signed)
It was very nice to see you today!  I am so sorry for your loss. Please let us know if we can be of any further assistance.  Your cholesterol numbers look good.  We will continue your current medications.  You have a sinus infection.  Start the Augmentin.  Let me know how you are doing in a week or so.   Take care, Dr [name]  PLEASE NOTE:  If you had any lab tests, please let us know if you have not heard back within a few days. You may see your results on mychart before we have a chance to review them but we will give you a call once they are reviewed by [place].   If we ordered any referrals today, please let us know if you have not heard from their office within the next week.   If you had any urgent prescriptions sent in today, please check with the pharmacy within an hour of our visit to make sure the prescription was transmitted appropriately.   Please try these tips to maintain a healthy lifestyle:  Eat at least 3 REAL meals and 1-2 snacks per day.  Aim for no more than 5 hours between eating.  If you eat breakfast, please do so within one hour of getting up.   Each meal should contain half fruits/vegetables, one quarter protein, and one quarter carbs (no bigger than a computer mouse)  Cut down on sweet beverages. This includes juice, soda, and sweet tea.   Drink at least 1 glass of water with each meal and aim for at least 8 glasses per day  Exercise at least 150 minutes every week.

## 2022-07-24 NOTE — Assessment & Plan Note (Signed)
Last lipid panel was better with LDL less than 100.  She has a good HDL to LDL ratio.  She has not tolerated statins in the past.  Will continue her Zetia and fenofibrate.  We can recheck again in 6 to 12 months.

## 2022-07-24 NOTE — Assessment & Plan Note (Signed)
Patient going through bereavement due to the loss of her son a few weeks ago.  She has been taking Prozac 60 mg daily for a couple of weeks and has not noticed much of a benefit as of yet.  She has good support at home and will be seeing a grief counselor soon.  We will continue current medication regimen for now she will follow-up with me in a few weeks if we need to make any further adjustments.

## 2022-07-24 NOTE — Assessment & Plan Note (Signed)
Symptoms are worse recently due to above treatment.  She has been taking Xanax 0.5 mg 2-3 times per day.  Does not need refill today.  We increased her dose of Prozac as above.  She will follow-up in a few weeks to let me know how she is doing.

## 2022-07-24 NOTE — Progress Notes (Signed)
   [name] is a 77 y.o. female who presents today for an office visit.  Assessment/Plan:  New/Acute Problems: Sinusitis  No red flags.  Given length of symptoms will start Augmentin.  She will continue over-the-counter meds as needed.  She will let me know if not improving and we can refer her to ENT.  Chronic Problems Addressed Today: Hyperlipidemia Last lipid panel was better with LDL less than 100.  She has a good HDL to LDL ratio.  She has not tolerated statins in the past.  Will continue her Zetia and fenofibrate.  We can recheck again in 6 to 12 months.  Depression Patient going through bereavement due to the loss of her son a few weeks ago.  She has been taking Prozac 60 mg daily for a couple of weeks and has not noticed much of a benefit as of yet.  She has good support at home and will be seeing a grief counselor soon.  We will continue current medication regimen for now she will follow-up with me in a few weeks if we need to make any further adjustments.  Anxiety Symptoms are worse recently due to above treatment.  She has been taking Xanax 0.5 mg 2-3 times per day.  Does not need refill today.  We increased her dose of Prozac as above.  She will follow-up in a few weeks to let me know how she is doing.     Subjective:  HPI:  Patient here today for follow-up.  We last saw her about 4 months ago.  At that visit we added on fenofibrate for her dyslipidemia.  She was already on Zetia.  Since our last visit she has been seen here a couple of time by different provider for COVID and also for grief reaction.  Her son passed away a few weeks ago and she has had difficult time managing this.  She was seen here few weeks ago her Prozac was increased to 60 mg daily.  She does have good support at home and has been getting grief support with her therapist.  She is still having ongoing issues with sinus pressure since having COVID about 3 to 4 weeks ago.  Cough is improving. She is  having some sneezing. Feels like previous sinus infections.        Objective:  Physical Exam: BP 120/78   Pulse 80   Temp 98.2 F (36.8 C) (Temporal)   Ht '5\' 1"'$  (1.549 m)   Wt 126 lb 3.2 oz (57.2 kg)   SpO2 98%   BMI 23.85 kg/m   Gen: No acute distress, resting comfortably HEENT: TMs with clear effusion.  OP erythematous.  Nasal mucosa erythematous and boggy bilaterally. CV: Regular rate and rhythm with no murmurs appreciated Pulm: Normal work of breathing, clear to auscultation bilaterally with no crackles, wheezes, or rhonchi Neuro: Grossly normal, moves all extremities.  Coarse diffuse tremor noted Psych: Normal affect and thought content      Kahleah Crass M. [name], MD 07/24/2022 10:08 AM

## 2022-07-31 ENCOUNTER — Other Ambulatory Visit: Payer: Self-pay | Admitting: Family Medicine

## 2022-08-14 NOTE — Telephone Encounter (Signed)
Prolia VOB initiated via [email or web address]  Last Prolia inj 04/01/22 Next Prolia inj due 10/01/22

## 2022-08-20 ENCOUNTER — Ambulatory Visit (INDEPENDENT_AMBULATORY_CARE_PROVIDER_SITE_OTHER): Payer: Medicare PPO

## 2022-08-20 VITALS — Wt 126.0 lb

## 2022-08-20 DIAGNOSIS — Z0000 Encounter for general adult medical examination without abnormal findings: Secondary | ICD-10-CM | POA: Diagnosis not present

## 2022-08-20 NOTE — Progress Notes (Signed)
I connected with  [name] on 08/20/22 by a audio enabled telemedicine application and verified that I am speaking with the correct person using two identifiers.  Patient Location: Home  Provider Location: Office/Clinic  I discussed the limitations of evaluation and management by telemedicine. The patient expressed understanding and agreed to proceed.   Subjective:   [name] is a 77 y.o. female who presents for Medicare Annual (Subsequent) preventive examination.  Review of Systems     Cardiac Risk Factors include: advanced age (>37mn, >>62women);dyslipidemia     Objective:    Today's Vitals   08/20/22 1005  Weight: 126 lb (57.2 kg)   Body mass index is 23.81 kg/m.     08/20/2022   10:17 AM 12/10/2021    2:50 PM 08/05/2021   10:13 AM 03/15/2020    2:03 PM 03/08/2019   10:19 AM 11/19/2017   10:23 AM 07/28/2017    5:22 PM  Advanced Directives  Does Patient Have a Medical Advance Directive? Yes Yes Yes Yes Yes Yes Yes  Type of AParamed[place]of [place] will [place] will Healthcare Power of [place]Living will Living will;Healthcare Power of [place]onial HeightsLiving will [place] CreekLiving will  Does patient want to make changes to medical advance directive?      No - Patient declined No - Patient declined  Copy of H[place] Chart? No - copy requested No - copy requested No - copy requested   No - copy requested No - copy requested  Would patient like information on creating a medical advance directive?       No - Patient declined    Current Medications (verified) Outpatient Encounter Medications as of 08/20/2022  Medication Sig   ALPRAZolam (XANAX) 0.5 MG tablet Take 1 tablet by mouth twice daily   ezetimibe (ZETIA) 10 MG tablet Take 1 tablet by mouth once daily   fenofibrate (TRICOR) 48 MG tablet Take 1 tablet by mouth once  daily   FLUoxetine (PROZAC) 20 MG capsule Take 1 capsule (20 mg total) by mouth daily.   FLUoxetine (PROZAC) 40 MG capsule Take 1 capsule by mouth once daily   mirabegron ER (MYRBETRIQ) 25 MG TB24 tablet Take 1 tablet (25 mg total) by mouth daily.   traZODone (DESYREL) 50 MG tablet TAKE 1 TABLET BY MOUTH AT BEDTIME   triamcinolone cream (KENALOG) 0.1 % Apply 1 application topically 2 (two) times daily.   [DISCONTINUED] amoxicillin-clavulanate (AUGMENTIN) 875-125 MG tablet Take 1 tablet by mouth 2 (two) times daily.   [DISCONTINUED] tetrahydrozoline 0.05 % ophthalmic solution    No facility-administered encounter medications on file as of 08/20/2022.    Allergies (verified) Shellfish-derived products, Lipitor [atorvastatin], Crestor [rosuvastatin calcium], Evolocumab, Metoprolol, Pravastatin, Prednisone, Zetia [ezetimibe], and Codeine   History: Past Medical History:  Diagnosis Date   Anxiety    Arthritis    "back, neck" (03/08/2017)   Asthma in child    Bowel obstruction ([place]    CAD (coronary artery disease), native coronary artery    Chronic lower back pain    Chronic neck pain    Closed left tibial fracture    Colon polyps    Complication of anesthesia    "I was awake during one of my surgeries" reports that she felt them start the incision and heard them talking reports happened ~ 35 years ago     Depression    Diverticulosis  Hyperlipidemia    Irritable bowel syndrome    Migraine    "used to be severe; now maybe 1/year or 2" (03/08/2017)   Pneumonia    H!N!   Past Surgical History:  Procedure Laterality Date   ANKLE HARDWARE REMOVAL Left 07/27/2017   Hardware removal left ankle, takedown non-union with allograft bone graft, Intramedullary Nail (Left)   APPENDECTOMY     AUGMENTATION MAMMAPLASTY Bilateral    BRACHIOPLASTY Bilateral    Arm Lift    CARDIAC CATHETERIZATION     CHOLECYSTECTOMY OPEN     COLON SURGERY     EXTERNAL FIXATION LEG Left 03/08/2017   Procedure:  INTERNAL FIXATION LEFT ANKLE;  Surgeon: Newt Minion, MD;  Location: [facility];  Service: Orthopedics;  Laterality: Left;   EYE MUSCLE SURGERY Bilateral X 2   "lazy eye; OR twice on each eye; Dr. Annamaria Boots"   [place] Left 07/27/2017   Procedure: HARDWARE REMOVAL;  Surgeon: Mcarthur Rossetti, MD;  Location: [facility];  Service: Orthopedics;  Laterality: Left;   LEFT HEART CATH AND CORONARY ANGIOGRAPHY N/A 10/27/2016   Procedure: Left Heart Cath and Coronary Angiography;  Surgeon: Peter M Martinique, MD;  Location: [facility];  Service: Cardiovascular;  Laterality: N/A;   LEFT HEART CATHETERIZATION WITH CORONARY ANGIOGRAM N/A 08/16/2014   Procedure: LEFT HEART CATHETERIZATION WITH CORONARY ANGIOGRAM;  Surgeon: Sinclair Grooms, MD;  Location: [facility] C[facility];  Service: Cardiovascular;  Laterality: N/A;   NASAL SINUS SURGERY     ORIF ANKLE FRACTURE Left 03/08/2017   ORIF ANKLE FRACTURE Left 07/27/2017   Procedure: Hardware removal left ankle, takedown non-union with allograft bone graft, Intramedullary Nail;  Surgeon: Mcarthur Rossetti, MD;  Location: [facility];  Service: Orthopedics;  Laterality: Left;   PARTIAL COLECTOMY  1970s   TONSILLECTOMY     Family History  Problem Relation Age of Onset   Heart disease Mother        CHF   Coronary artery disease Mother    Hypertension Mother    Hyperlipidemia Mother    Heart disease Father        CHF   Coronary artery disease Father    Diabetes Sister        with all the complications   Coronary artery disease Sister    Stroke Sister 27       CVA   Peripheral vascular disease Sister    Colon cancer Maternal Grandfather    Healthy Son    Liver disease Son        liver failure    Cancer Neg Hx        breast or colon   Social History   Socioeconomic History   Marital status: Married    Spouse name: Not on file   Number of children: 2   Years of education: Not on file   Highest education level: Bachelor's degree  (e.g., BA, AB, BS)  Occupational History   Occupation: Retired Merchant navy officer)     Employer: RETIRED  Tobacco Use   Smoking status: Former    Packs/day: 1.00    Years: 41.00    Total pack years: 41.00    Types: Cigarettes    Quit date: 01/10/2010    Years since quitting: 12.6   Smokeless tobacco: Never  Vaping Use   Vaping Use: Former  Substance and Sexual Activity   Alcohol use: No   Drug use: No   Sexual activity:  Not Currently    Partners: Female  Other Topics Concern   Not on file  Social History Narrative   [place]   Married '72-16 yrs/divorce; married '92   1 grandchild   Niece (jamie-Madeline's daughter, with 3 children and full time Ship broker)   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   Marriage in good health.      Physician roster   Gyn- Helene Shoe, NP   Opthal- Dr Annamaria Boots (pediatric opthal)   GI- dr Adriana Simas- Dr Gladstone Lighter   ENT- Dr Ernesto Rutherford            Social Determinants of Health   Financial Resource Strain: Low Risk  (08/20/2022)   Overall Financial Resource Strain (CARDIA)    Difficulty of Paying Living Expenses: Not hard at all  Food Insecurity: No Food Insecurity (08/20/2022)   Hunger Vital Sign    Worried About [place] in the Last Year: Never true    [place] in the Last Year: Never true  Transportation Needs: No Transportation Needs (08/20/2022)   PRAPARE - Transportation    Lack of Transportation (Medical): No    Lack of Transportation (Non-Medical): No  Physical Activity: Inactive (08/20/2022)   Exercise Vital Sign    Days of Exercise per Week: 0 days    Minutes of Exercise per Session: 0 min  Stress: Stress Concern Present (08/20/2022)   [place]    Feeling of Stress : To some extent  Social Connections: Moderately Isolated (08/20/2022)   Social Connection and Isolation Panel [NHANES]    Frequency of Communication with Friends and Family: Twice a week     Frequency of Social Gatherings with Friends and Family: Twice a week    Attends Religious Services: Never    Marine scientist or Organizations: No    Attends Music therapist: Never    Marital Status: Married    Tobacco Counseling Counseling given: Not Answered   Clinical Intake:  Pre-visit preparation completed: Yes  Pain : No/denies pain     BMI - recorded: 23.81 Nutritional Status: BMI of 19-24  Normal Nutritional Risks: None Diabetes: No  How often do you need to have someone help you when you read instructions, pamphlets, or other written materials from your doctor or pharmacy?: 1 - Never  Diabetic?no  Interpreter Needed?: No  Information entered by :: Charlott Rakes, LPN   Activities of Daily Living    08/20/2022   10:18 AM  In your present state of health, do you have any difficulty performing the following activities:  Hearing? 0  Vision? 0  Difficulty concentrating or making decisions? 0  Walking or climbing stairs? 0  Dressing or bathing? 0  Doing errands, shopping? 0  Preparing Food and eating ? N  Using the Toilet? N  In the past six months, have you accidently leaked urine? Y  Comment at times  Do you have problems with loss of bowel control? N  Managing your Medications? N  Managing your Finances? N  Housekeeping or managing your Housekeeping? N    Patient Care Team: Vivi Barrack, MD as PCP - General (Family Medicine) Nahser, Wonda Cheng, MD as PCP - Cardiology (Cardiology)  Indicate any recent Medical Services you may have received from other than [facility] providers in the past year (date may be approximate).     Assessment:   This is a routine wellness examination  for [place].  Hearing/Vision screen Hearing Screening - Comments:: Pt denies any hearing issues  Vision Screening - Comments:: Pt will follow up with [facility] for annual eye exams   Dietary issues and exercise activities discussed: Current Exercise Habits:  The patient does not participate in regular exercise at present   Goals Addressed             This Visit's Progress    Patient Stated       None at this time        Depression Screen    08/20/2022   10:14 AM 07/24/2022   10:09 AM 07/02/2022   12:03 PM 03/13/2022    1:56 PM 11/25/2021   11:21 AM 08/05/2021   10:12 AM 02/11/2021   10:50 AM  PHQ 2/9 Scores  PHQ - 2 Score '6 6 6 '$ 0 0 0 0  PHQ- 9 Score '12 22 26 '$ 0 2  0    Fall Risk    08/20/2022   10:18 AM 07/24/2022    9:31 AM 03/13/2022    1:56 PM 11/25/2021   11:09 AM 08/05/2021   10:14 AM  Fall Risk   Falls in the past year? 0 0 0 0   Number falls in past yr: 0 0 0 0 0  Injury with Fall? 0 0 0 0 0  Risk for fall due to : No Fall Risks No Fall Risks No Fall Risks No Fall Risks   Follow up Falls prevention discussed  Falls evaluation completed  Falls prevention discussed    FALL RISK PREVENTION PERTAINING TO THE HOME:  Any stairs in or around the home? Yes  If so, are there any without handrails? No  Home free of loose throw rugs in walkways, pet beds, electrical cords, etc? Yes  Adequate lighting in your home to reduce risk of falls? Yes   ASSISTIVE DEVICES UTILIZED TO PREVENT FALLS:  Life alert? No  Use of a cane, walker or w/c? No  Grab bars in the bathroom? Yes  Shower chair or bench in shower? No  Elevated toilet seat or a handicapped toilet? No   TIMED UP AND GO:  Was the test performed? No .  Cognitive Function:        08/20/2022   10:19 AM 08/05/2021   10:16 AM  6CIT Screen  What Year? 0 points 0 points  What month? 0 points 0 points  What time? 0 points 0 points  Count back from 20 0 points 0 points  Months in reverse 4 points 0 points  Repeat phrase 2 points 4 points  Total Score 6 points 4 points    Immunizations Immunization History  Administered Date(s) Administered   Influenza Split 06/04/2009, 03/23/2014, 04/19/2017   Influenza Whole 04/12/2009, 04/24/2010   Influenza, High Dose Seasonal PF  05/15/2016, 04/19/2017, 04/04/2018, 03/27/2019, 05/27/2020   Influenza-Unspecified 03/13/2014, 05/29/2015, 05/15/2016, 03/11/2017, 04/04/2018, 03/27/2019, 05/11/2021   Moderna Sars-Covid-2 Vaccination 08/18/2019, 09/20/2019   Pneumococcal Conjugate-13 09/29/2013   Pneumococcal Polysaccharide-23 11/23/2007, 06/22/2008, 03/10/2019   Td 08/22/2012   Td (Adult) 08/22/2012   Tdap 09/29/2013   Tetanus 08/22/2012   Zoster Recombinat (Shingrix) 10/24/2019, 02/20/2020   Zoster, Live 12/26/2008, 10/24/2019, 02/20/2020    TDAP status: Up to date  Flu Vaccine status: Due, Education has been provided regarding the importance of this vaccine. Advised may receive this vaccine at local pharmacy or Health Dept. Aware to provide a copy of the vaccination record if obtained from local pharmacy or Health Dept.  Verbalized acceptance and understanding.  Pneumococcal vaccine status: Up to date  Covid-19 vaccine status: Completed vaccines  Qualifies for Shingles Vaccine? Yes   Zostavax completed Yes   Shingrix Completed?: Yes  Screening Tests Health Maintenance  Topic Date Due   Lung Cancer Screening  08/11/2015   COLONOSCOPY (Pts 45-57yr Insurance coverage will need to be confirmed)  10/27/2022   MAMMOGRAM  08/20/2023   Medicare Annual Wellness (AWV)  08/21/2023   DTaP/Tdap/Td (5 - Td or Tdap) 09/30/2023   DEXA SCAN  10/28/2023   Pneumonia Vaccine 75 Years old  Completed   Hepatitis C Screening  Completed   Zoster Vaccines- Shingrix  Completed   HPV VACCINES  Aged Out   INFLUENZA VACCINE  Discontinued   COVID-19 Vaccine  Discontinued    Health Maintenance  Health Maintenance Due  Topic Date Due   Lung Cancer Screening  08/11/2015    Colorectal cancer screening: Type of screening: Colonoscopy. Completed 10/26/12. Repeat every 10 years  Mammogram status: Completed 08/19/21. Repeat every year  Bone Density status: Completed 10/27/21. Results reflect: Bone density results: OSTEOPOROSIS.  Repeat every 2 years.  Lung Cancer Screening: (Low Dose CT Chest recommended if Age 75-80years, 30 pack-year currently smoking OR have quit w/in 15years.) does qualify.   Lung Cancer Screening Referral: will discuss withPCP  Additional Screening:  Hepatitis C Screening: Completed 08/27/21  Vision Screening: Recommended annual ophthalmology exams for early detection of glaucoma and other disorders of the eye. Is the patient up to date with their annual eye exam?  Yes  Who is the provider or what is the name of the office in which the patient attends annual [facility] exams? [facility]  If pt is not established with a provider, would they like to be referred to a provider to establish care? No .   Dental Screening: Recommended annual dental exams for proper oral hygiene  Community Resource Referral / Chronic Care Management: CRR required this visit?  No   CCM required this visit?  No      Plan:     I have personally reviewed and noted the following in the patient's chart:   Medical and social history Use of alcohol, tobacco or illicit drugs  Current medications and supplements including opioid prescriptions. Patient is not currently taking opioid prescriptions. Functional ability and status Nutritional status Physical activity Advanced directives List of other physicians Hospitalizations, surgeries, and ER visits in previous 12 months Vitals Screenings to include cognitive, depression, and falls Referrals and appointments  In addition, I have reviewed and discussed with patient certain preventive protocols, quality metrics, and best practice recommendations. A written personalized care plan for preventive services as well as general preventive health recommendations were provided to patient.     TWillette Brace LPN   28/07/8297  Nurse Notes: none

## 2022-08-20 NOTE — Patient Instructions (Signed)
[name] , Thank you for taking time to come for your Medicare Wellness Visit. I appreciate your ongoing commitment to your health goals. Please review the following plan we discussed and let me know if I can assist you in the future.   These are the goals we discussed:  Goals      Patient Stated     None at this Time     Patient Stated     None at this time         This is a list of the screening recommended for you and due dates:  Health Maintenance  Topic Date Due   Screening for Lung Cancer  08/11/2015   Colon Cancer Screening  10/27/2022   Mammogram  08/20/2023   Medicare Annual Wellness Visit  08/21/2023   DTaP/Tdap/Td vaccine (5 - Td or Tdap) 09/30/2023   DEXA scan (bone density measurement)  10/28/2023   Pneumonia Vaccine  Completed   Hepatitis C Screening: USPSTF Recommendation to screen - Ages 53-79 yo.  Completed   Zoster (Shingles) Vaccine  Completed   HPV Vaccine  Aged Out   Flu Shot  Discontinued   COVID-19 Vaccine  Discontinued    Advanced directives: Please bring a copy of your health care power of attorney and living will to the office at your convenience.  Conditions/risks identified: none at this time   Next appointment: Follow up in one year for your annual wellness visit    Preventive Care 65 Years and Older, Female Preventive care refers to lifestyle choices and visits with your health care provider that can promote health and wellness. What does preventive care include? A yearly physical exam. This is also called an annual well check. Dental exams once or twice a year. Routine eye exams. Ask your health care provider how often you should have your eyes checked. Personal lifestyle choices, including: Daily care of your teeth and gums. Regular physical activity. Eating a healthy diet. Avoiding tobacco and drug use. Limiting alcohol use. Practicing safe sex. Taking low-dose aspirin every day. Taking vitamin and mineral supplements as  recommended by your health care provider. What happens during an annual well check? The services and screenings done by your health care provider during your annual well check will depend on your age, overall health, lifestyle risk factors, and family history of disease. Counseling  Your health care provider may ask you questions about your: Alcohol use. Tobacco use. Drug use. Emotional well-being. Home and relationship well-being. Sexual activity. Eating habits. History of falls. Memory and ability to understand (cognition). Work and work Statistician. Reproductive health. Screening  You may have the following tests or measurements: Height, weight, and BMI. Blood pressure. Lipid and cholesterol levels. These may be checked every 5 years, or more frequently if you are over 50 years old. Skin check. Lung cancer screening. You may have this screening every year starting at age 56 if you have a 30-pack-year history of smoking and currently smoke or have quit within the past 15 years. Fecal occult blood test (FOBT) of the stool. You may have this test every year starting at age 35. Flexible sigmoidoscopy or colonoscopy. You may have a sigmoidoscopy every 5 years or a colonoscopy every 10 years starting at age 17. Hepatitis C blood test. Hepatitis B blood test. Sexually transmitted disease (STD) testing. Diabetes screening. This is done by checking your blood sugar (glucose) after you have not eaten for a while (fasting). You may have this done every 1-3 years.  Bone density scan. This is done to screen for osteoporosis. You may have this done starting at age 29. Mammogram. This may be done every 1-2 years. Talk to your health care provider about how often you should have regular mammograms. Talk with your health care provider about your test results, treatment options, and if necessary, the need for more tests. Vaccines  Your health care provider may recommend certain vaccines, such  as: Influenza vaccine. This is recommended every year. Tetanus, diphtheria, and acellular pertussis (Tdap, Td) vaccine. You may need a Td booster every 10 years. Zoster vaccine. You may need this after age 51. Pneumococcal 13-valent conjugate (PCV13) vaccine. One dose is recommended after age 83. Pneumococcal polysaccharide (PPSV23) vaccine. One dose is recommended after age 34. Talk to your health care provider about which screenings and vaccines you need and how often you need them. This information is not intended to replace advice given to you by your health care provider. Make sure you discuss any questions you have with your health care provider. Document Released: 07/26/2015 Document Revised: 03/18/2016 Document Reviewed: 04/30/2015 Elsevier Interactive Patient Education  2017 [place] Prevention in the Home Falls can cause injuries. They can happen to people of all ages. There are many things you can do to make your home safe and to help prevent falls. What can I do on the outside of my home? Regularly fix the edges of walkways and driveways and fix any cracks. Remove anything that might make you trip as you walk through a door, such as a raised step or threshold. Trim any bushes or trees on the path to your home. Use bright outdoor lighting. Clear any walking paths of anything that might make someone trip, such as rocks or tools. Regularly check to see if handrails are loose or broken. Make sure that both sides of any steps have handrails. Any raised decks and porches should have guardrails on the edges. Have any leaves, snow, or ice cleared regularly. Use sand or salt on walking paths during winter. Clean up any spills in your garage right away. This includes oil or grease spills. What can I do in the bathroom? Use night lights. Install grab bars by the toilet and in the tub and shower. Do not use towel bars as grab bars. Use non-skid mats or decals in the tub or  shower. If you need to sit down in the shower, use a plastic, non-slip stool. Keep the floor dry. Clean up any water that spills on the floor as soon as it happens. Remove soap buildup in the tub or shower regularly. Attach bath mats securely with double-sided non-slip rug tape. Do not have throw rugs and other things on the floor that can make you trip. What can I do in the bedroom? Use night lights. Make sure that you have a light by your bed that is easy to reach. Do not use any sheets or blankets that are too big for your bed. They should not hang down onto the floor. Have a firm chair that has side arms. You can use this for support while you get dressed. Do not have throw rugs and other things on the floor that can make you trip. What can I do in the kitchen? Clean up any spills right away. Avoid walking on wet floors. Keep items that you use a lot in easy-to-reach places. If you need to reach something above you, use a strong step stool that has a grab bar.  Keep electrical cords out of the way. Do not use floor polish or wax that makes floors slippery. If you must use wax, use non-skid floor wax. Do not have throw rugs and other things on the floor that can make you trip. What can I do with my stairs? Do not leave any items on the stairs. Make sure that there are handrails on both sides of the stairs and use them. Fix handrails that are broken or loose. Make sure that handrails are as long as the stairways. Check any carpeting to make sure that it is firmly attached to the stairs. Fix any carpet that is loose or worn. Avoid having throw rugs at the top or bottom of the stairs. If you do have throw rugs, attach them to the floor with carpet tape. Make sure that you have a light switch at the top of the stairs and the bottom of the stairs. If you do not have them, ask someone to add them for you. What else can I do to help prevent falls? Wear shoes that: Do not have high heels. Have  rubber bottoms. Are comfortable and fit you well. Are closed at the toe. Do not wear sandals. If you use a stepladder: Make sure that it is fully opened. Do not climb a closed stepladder. Make sure that both sides of the stepladder are locked into place. Ask someone to hold it for you, if possible. Clearly mark and make sure that you can see: Any grab bars or handrails. First and last steps. Where the edge of each step is. Use tools that help you move around (mobility aids) if they are needed. These include: Canes. Walkers. Scooters. Crutches. Turn on the lights when you go into a dark area. Replace any light bulbs as soon as they burn out. Set up your furniture so you have a clear path. Avoid moving your furniture around. If any of your floors are uneven, fix them. If there are any pets around you, be aware of where they are. Review your medicines with your doctor. Some medicines can make you feel dizzy. This can increase your chance of falling. Ask your doctor what other things that you can do to help prevent falls. This information is not intended to replace advice given to you by your health care provider. Make sure you discuss any questions you have with your health care provider. Document Released: 04/25/2009 Document Revised: 12/05/2015 Document Reviewed: 08/03/2014 Elsevier Interactive Patient Education  2017 [employer].

## 2022-09-01 ENCOUNTER — Other Ambulatory Visit: Payer: Self-pay | Admitting: Family Medicine

## 2022-09-07 NOTE — Telephone Encounter (Signed)
Prior auth required for PROLIA ? ?PA PROCESS DETAILS: PA is required. PA can be initiated by calling [phone] or online at ?[email or web address] ? ?

## 2022-09-08 ENCOUNTER — Other Ambulatory Visit: Payer: Self-pay | Admitting: Family Medicine

## 2022-09-21 NOTE — Telephone Encounter (Signed)
Prior Authorization initiated for PROLIA via [email or web address] KEY: [id number]

## 2022-09-24 ENCOUNTER — Other Ambulatory Visit: Payer: Self-pay | Admitting: Family Medicine

## 2022-09-24 NOTE — Telephone Encounter (Signed)
APPROVED  Key: [id number] PA Case ID #: [id number] Valid: 11/06/19-07/13/23

## 2022-09-24 NOTE — Telephone Encounter (Signed)
Pt ready for scheduling on or after 10/01/22  Out-of-pocket cost due at time of visit: $8  Primary: [employer] [place] PPO Prolia co-insurance: 0% Admin fee co-insurance: 0%  Secondary: n/a Prolia co-insurance:  Admin fee co-insurance:   Deductible: does not apply  Prior Auth: APPROVED PA Case ID #: [id number] Valid: 11/06/19-07/13/23    ** This summary of benefits is an estimation of the patient's out-of-pocket cost. Exact cost may very based on individual plan coverage.

## 2022-10-02 ENCOUNTER — Other Ambulatory Visit: Payer: Self-pay | Admitting: Family Medicine

## 2022-10-14 NOTE — Telephone Encounter (Signed)
Left VM to schedule next Prolia due on or after 10/01/22 $8 due at check in per VOB

## 2022-10-21 ENCOUNTER — Other Ambulatory Visit: Payer: Self-pay | Admitting: Family Medicine

## 2022-11-03 ENCOUNTER — Other Ambulatory Visit: Payer: Self-pay | Admitting: Family Medicine

## 2022-11-03 ENCOUNTER — Encounter: Payer: Self-pay | Admitting: Family Medicine

## 2022-11-03 ENCOUNTER — Ambulatory Visit: Payer: Medicare PPO | Admitting: Family Medicine

## 2022-11-03 VITALS — BP 121/81 | HR 81 | Temp 97.5°F | Ht 61.0 in | Wt 128.8 lb

## 2022-11-03 DIAGNOSIS — F4321 Adjustment disorder with depressed mood: Secondary | ICD-10-CM

## 2022-11-03 DIAGNOSIS — F320 Major depressive disorder, single episode, mild: Secondary | ICD-10-CM

## 2022-11-03 DIAGNOSIS — E538 Deficiency of other specified B group vitamins: Secondary | ICD-10-CM

## 2022-11-03 DIAGNOSIS — N3941 Urge incontinence: Secondary | ICD-10-CM | POA: Diagnosis not present

## 2022-11-03 DIAGNOSIS — Z131 Encounter for screening for diabetes mellitus: Secondary | ICD-10-CM | POA: Diagnosis not present

## 2022-11-03 DIAGNOSIS — E559 Vitamin D deficiency, unspecified: Secondary | ICD-10-CM

## 2022-11-03 DIAGNOSIS — Z1211 Encounter for screening for malignant neoplasm of colon: Secondary | ICD-10-CM

## 2022-11-03 MED ORDER — BUPROPION HCL ER (XL) 150 MG PO TB24
150.0000 mg | ORAL_TABLET | Freq: Every day | ORAL | 3 refills | Status: DC
Start: 2022-11-03 — End: 2023-06-14

## 2022-11-03 MED ORDER — FLUOXETINE HCL 20 MG PO CAPS: 20.0000 mg | ORAL_CAPSULE | Freq: Every day | ORAL | 3 refills | Status: DC

## 2022-11-03 MED ORDER — FLUOXETINE HCL 40 MG PO CAPS: 40.0000 mg | ORAL_CAPSULE | Freq: Every day | ORAL | 0 refills | Status: DC

## 2022-11-03 MED ORDER — MIRABEGRON ER 50 MG PO TB24: 50.0000 mg | ORAL_TABLET | Freq: Every day | ORAL | 3 refills | Status: AC

## 2022-11-03 NOTE — Assessment & Plan Note (Addendum)
Slightly better since her last visit though still having difficult time.  Will continue Prozac 60 mg daily.  She has been seeing a grief counselor with hospice which has helped as well.  Due to ongoing symptoms, we will add on Wellbutrin 150 mg daily.  Discussed potential side effects.  She will follow-up in a few weeks via MyChart.  We can titrate the dose of Wellbutrin as needed.

## 2022-11-03 NOTE — Progress Notes (Signed)
   [name] is a 77 y.o. female who presents today for an office visit.  Assessment/Plan:  New/Acute Problems: Other Fatigue  Likely due to her underlying depression and grief reaction that we will check labs today to rule out other possible causes.  Check CBC, c-Met, TSH B12, and vitamin D.  We are treating her depression as below.  Chronic Problems Addressed Today: Depression Slightly better since her last visit though still having difficult time.  Will continue Prozac 60 mg daily.  She has been seeing a grief counselor with hospice which has helped as well.  Due to ongoing symptoms, we will add on Wellbutrin 150 mg daily.  Discussed potential side effects.  She will follow-up in a few weeks via MyChart.  We can titrate the dose of Wellbutrin as needed.  Urge incontinence Only having modest improvement with Myrbetriq.  Will increase dose to 50 mg daily.  She will follow-up with me in a few weeks.  Preventative health care-Cologuard ordered.    Subjective:  HPI:  See A/p for status of chronic conditions.  She is here today for follow up. We last saw her 3 months ago.  At that time she was going through the grief process due to losing her son a few weeks prior.  She is on Prozac 60 mg daily.  She has been seeing a the grief counselor.  She is still having difficult time but does seem to be slowly improving.  No reported SI or HI.  She does admit she has been low with energy levels.  She is interested in maybe getting a B12 shot if needed.       Objective:  Physical Exam: BP 121/81   Pulse 81   Temp (!) 97.5 F (36.4 C) (Temporal)   Ht  (1.549 m)   Wt 128 lb 12.8 oz (58.4 kg)   SpO2 98%   BMI 24.34 kg/m   Gen: No acute distress, resting comfortably Neuro: Grossly normal, moves all extremities Psych: Normal affect and thought content      Kieron Kantner M. Jimmey Ralph, MD 11/03/2022 2:58 PM

## 2022-11-03 NOTE — Assessment & Plan Note (Signed)
Only having modest improvement with Myrbetriq.  Will increase dose to 50 mg daily.  She will follow-up with me in a few weeks.

## 2022-11-03 NOTE — Patient Instructions (Addendum)
It was very nice to see you today!  Please start the wellbutrin  in addition to your prozac.  Please increase your myrbetriq to  daily.  We will check blood work today.  Please checkin with me in a couple of weeks.   Take care, Dr Jimmey Ralph  PLEASE NOTE:  If you had any lab tests, please let us know if you have not heard back within a few days. You may see your results on mychart before we have a chance to review them but we will give you a call once they are reviewed by [place].   If we ordered any referrals today, please let us know if you have not heard from their office within the next week.   If you had any urgent prescriptions sent in today, please check with the pharmacy within an hour of our visit to make sure the prescription was transmitted appropriately.   Please try these tips to maintain a healthy lifestyle:  Eat at least 3 REAL meals and 1-2 snacks per day.  Aim for no more than 5 hours between eating.  If you eat breakfast, please do so within one hour of getting up.   Each meal should contain half fruits/vegetables, one quarter protein, and one quarter carbs (no bigger than a computer mouse)  Cut down on sweet beverages. This includes juice, soda, and sweet tea.   Drink at least 1 glass of water with each meal and aim for at least 8 glasses per day  Exercise at least 150 minutes every week.

## 2022-11-04 LAB — COMPREHENSIVE METABOLIC PANEL
ALT: 16 U/L (ref 0–35)
AST: 25 U/L (ref 0–37)
Albumin: 4.2 g/dL (ref 3.5–5.2)
Alkaline Phosphatase: 14 U/L — ABNORMAL LOW (ref 39–117)
BUN: 22 mg/dL (ref 6–23)
CO2: 28 mEq/L (ref 19–32)
Calcium: 9.4 mg/dL (ref 8.4–10.5)
Chloride: 104 mEq/L (ref 96–112)
Creatinine, Ser: 1.35 mg/dL — ABNORMAL HIGH (ref 0.40–1.20)
GFR: 38.58 mL/min — ABNORMAL LOW (ref >60.00)
Glucose, Bld: 86 mg/dL (ref 70–99)
Potassium: 4.3 mEq/L (ref 3.5–5.1)
Sodium: 141 mEq/L (ref 135–145)
Total Bilirubin: 0.4 mg/dL (ref 0.2–1.2)
Total Protein: 6.3 g/dL (ref 6.0–8.3)

## 2022-11-04 LAB — CBC
HCT: 41.1 % (ref 36.0–46.0)
Hemoglobin: 13.6 g/dL (ref 12.0–15.0)
MCHC: 33.1 g/dL (ref 30.0–36.0)
MCV: 90 fl (ref 78.0–100.0)
Platelets: 274 10*3/uL (ref 150.0–400.0)
RBC: 4.56 Mil/uL (ref 3.87–5.11)
RDW: 13.2 % (ref 11.5–15.5)
WBC: 7.3 10*3/uL (ref 4.0–10.5)

## 2022-11-04 LAB — VITAMIN D 25 HYDROXY (VIT D DEFICIENCY, FRACTURES): VITD: 41.75 ng/mL (ref 30.00–100.00)

## 2022-11-04 LAB — HEMOGLOBIN A1C: Hgb A1c MFr Bld: 5.8 % (ref 4.6–6.5)

## 2022-11-04 LAB — VITAMIN B12: Vitamin B-12: 263 pg/mL (ref 211–911)

## 2022-11-04 LAB — TSH: TSH: 0.76 u[IU]/mL (ref 0.35–5.50)

## 2022-11-05 NOTE — Progress Notes (Signed)
Her B12 is on the lower side and it would be reasonable to start B12 injections if she is agreeable.  She can do the B12 protocol here.  Her kidney numbers are up just a little bit since last time we checked.  She should make sure that she is getting plenty of fluids and we can recheck this again in a couple weeks.  Please place future order for bmet.  The rest of her labs are all stable.

## 2022-11-06 ENCOUNTER — Ambulatory Visit: Payer: Medicare PPO

## 2022-11-06 ENCOUNTER — Ambulatory Visit: Payer: Medicare PPO | Admitting: Family Medicine

## 2022-11-06 ENCOUNTER — Encounter: Payer: Self-pay | Admitting: Family Medicine

## 2022-11-06 VITALS — BP 126/100 | HR 74 | Temp 98.3°F | Ht 61.0 in | Wt 128.8 lb

## 2022-11-06 DIAGNOSIS — F320 Major depressive disorder, single episode, mild: Secondary | ICD-10-CM

## 2022-11-06 DIAGNOSIS — E559 Vitamin D deficiency, unspecified: Secondary | ICD-10-CM

## 2022-11-06 DIAGNOSIS — N183 Chronic kidney disease, stage 3 unspecified: Secondary | ICD-10-CM

## 2022-11-06 DIAGNOSIS — E538 Deficiency of other specified B group vitamins: Secondary | ICD-10-CM | POA: Diagnosis not present

## 2022-11-06 MED ORDER — CYANOCOBALAMIN 1000 MCG/ML IJ SOLN
1000.0000 ug | Freq: Once | INTRAMUSCULAR | Status: AC
Start: 2022-11-06 — End: 2022-11-06
  Administered 2022-11-06: 1000 ug via INTRAMUSCULAR

## 2022-11-06 NOTE — Progress Notes (Signed)
   [name] is a 77 y.o. female who presents today for an office visit.  Assessment/Plan:  Chronic Problems Addressed Today: B12 deficiency Low normal B12 on recent labs.  Could be contributing to fatigue.  She is interested in B12 replacement protocol.  Will give first injection today.  Stage 3 chronic kidney disease She had slight bump in creatinine on recent labs.  She will come back in a week or 2 to recheck.  Encouraged hydration.  Vitamin D deficiency, unspecified At goal on recent labs.  Depression We just ordered Wellbutrin 150 mg daily.  To really tell much of a difference at this point.  She will continue this as well as Prozac 60 mg daily and follow-up with [place] in a few weeks.     Subjective:  HPI:  See A/P for status of chronic conditions.  Patient is here today for follow-up.  We saw her 3 days ago.  She was having some issues with fatigue and we check labs at that time to allow possible causes.  Labs chronicle for slight elevation in creatinine and lower extremity B12.       Objective:  Physical Exam: BP (!) 126/100 (BP Location: Right Arm, Patient Position: Sitting)   Pulse 74   Temp 98.3 F (36.8 C) (Oral)   Ht 5\' 1"  (1.549 m)   Wt 128 lb 12.8 oz (58.4 kg)   SpO2 98%   BMI 24.34 kg/m   Gen: No acute distress, resting comfortably Neuro: Grossly normal, moves all extremities Psych: Normal affect and thought content      Gable Odonohue M. Jimmey Ralph, MD 11/06/2022 3:18 PM

## 2022-11-06 NOTE — Patient Instructions (Signed)
It was very nice to see you today!  Your B12 is low.  Start B12 shots here.  Please come back next week for blood work.  Return if symptoms worsen or fail to improve.   Take care, Dr Jimmey Ralph  PLEASE NOTE:  If you had any lab tests, please let us know if you have not heard back within a few days. You may see your results on mychart before we have a chance to review them but we will give you a call once they are reviewed by [place].   If we ordered any referrals today, please let us know if you have not heard from their office within the next week.   If you had any urgent prescriptions sent in today, please check with the pharmacy within an hour of our visit to make sure the prescription was transmitted appropriately.   Please try these tips to maintain a healthy lifestyle:  Eat at least 3 REAL meals and 1-2 snacks per day.  Aim for no more than 5 hours between eating.  If you eat breakfast, please do so within one hour of getting up.   Each meal should contain half fruits/vegetables, one quarter protein, and one quarter carbs (no bigger than a computer mouse)  Cut down on sweet beverages. This includes juice, soda, and sweet tea.   Drink at least 1 glass of water with each meal and aim for at least 8 glasses per day  Exercise at least 150 minutes every week.

## 2022-11-06 NOTE — Assessment & Plan Note (Signed)
She had slight bump in creatinine on recent labs.  She will come back in a week or 2 to recheck.  Encouraged hydration.

## 2022-11-06 NOTE — Assessment & Plan Note (Signed)
Low normal B12 on recent labs.  Could be contributing to fatigue.  She is interested in B12 replacement protocol.  Will give first injection today.

## 2022-11-06 NOTE — Assessment & Plan Note (Signed)
We just ordered Wellbutrin 150 mg daily.  To really tell much of a difference at this point.  She will continue this as well as Prozac 60 mg daily and follow-up with [place] in a few weeks.

## 2022-11-06 NOTE — Assessment & Plan Note (Signed)
At goal on recent labs.  

## 2022-11-09 ENCOUNTER — Other Ambulatory Visit: Payer: Self-pay

## 2022-11-09 DIAGNOSIS — E860 Dehydration: Secondary | ICD-10-CM

## 2022-11-09 DIAGNOSIS — Z1211 Encounter for screening for malignant neoplasm of colon: Secondary | ICD-10-CM | POA: Diagnosis not present

## 2022-11-11 ENCOUNTER — Encounter: Payer: Self-pay | Admitting: Gastroenterology

## 2022-11-12 ENCOUNTER — Ambulatory Visit (INDEPENDENT_AMBULATORY_CARE_PROVIDER_SITE_OTHER): Payer: Medicare PPO

## 2022-11-12 DIAGNOSIS — E538 Deficiency of other specified B group vitamins: Secondary | ICD-10-CM

## 2022-11-12 MED ORDER — CYANOCOBALAMIN 1000 MCG/ML IJ SOLN
1000.0000 ug | Freq: Once | INTRAMUSCULAR | Status: AC
Start: 2022-11-12 — End: 2022-11-12
  Administered 2022-11-12: 1000 ug via INTRAMUSCULAR

## 2022-11-12 NOTE — Progress Notes (Signed)
[name] 77 yr old female presents to office today for 2nd of 4 weekly B12 injections per Jacquiline Doe, MD. Administered CYANOCOBALAMIN 1,000 mcg IM left arm. Patient tolerated well.

## 2022-11-15 LAB — COLOGUARD: COLOGUARD: NEGATIVE

## 2022-11-16 NOTE — Progress Notes (Signed)
Great news! Cologuard is negative. We can recheck again in 3 years.  [name]. Jimmey Ralph, MD 11/16/2022 10:03 AM

## 2022-11-17 ENCOUNTER — Telehealth: Payer: Self-pay | Admitting: Endocrinology

## 2022-11-17 ENCOUNTER — Other Ambulatory Visit: Payer: Self-pay | Admitting: Family Medicine

## 2022-11-17 ENCOUNTER — Ambulatory Visit: Payer: Medicare PPO

## 2022-11-17 VITALS — BP 120/74 | Ht 61.0 in | Wt 126.6 lb

## 2022-11-17 DIAGNOSIS — M810 Age-related osteoporosis without current pathological fracture: Secondary | ICD-10-CM

## 2022-11-17 MED ORDER — DENOSUMAB 60 MG/ML ~~LOC~~ SOSY
60.0000 mg | PREFILLED_SYRINGE | Freq: Once | SUBCUTANEOUS | Status: AC
Start: 2022-11-17 — End: 2022-11-17
  Administered 2022-11-17: 60 mg via SUBCUTANEOUS

## 2022-11-17 NOTE — Progress Notes (Signed)
Patient verbally confirmed name, date of birth, and correct medication to be administered. Prolia injection administered and pt tolerated well.  

## 2022-11-17 NOTE — Telephone Encounter (Signed)
Last Prolia inj 11/17/22 Next Prolia inj due 05/21/23 

## 2022-11-17 NOTE — Telephone Encounter (Signed)
error 

## 2022-11-19 ENCOUNTER — Ambulatory Visit: Payer: Medicare PPO

## 2022-11-21 ENCOUNTER — Other Ambulatory Visit: Payer: Self-pay | Admitting: Family Medicine

## 2022-11-24 ENCOUNTER — Ambulatory Visit (INDEPENDENT_AMBULATORY_CARE_PROVIDER_SITE_OTHER): Payer: Medicare PPO

## 2022-11-24 DIAGNOSIS — E538 Deficiency of other specified B group vitamins: Secondary | ICD-10-CM

## 2022-11-24 MED ORDER — CYANOCOBALAMIN 1000 MCG/ML IJ SOLN
1000.0000 ug | Freq: Once | INTRAMUSCULAR | Status: AC
Start: 2022-11-24 — End: 2022-11-24
  Administered 2022-11-24: 1000 ug via INTRAMUSCULAR

## 2022-11-24 NOTE — Progress Notes (Signed)
Pt here for B12 injection for Dr Parker. Injection given in left deltoid. Pt tolerated well.   

## 2022-11-26 ENCOUNTER — Ambulatory Visit: Payer: Medicare PPO

## 2022-12-01 ENCOUNTER — Ambulatory Visit (INDEPENDENT_AMBULATORY_CARE_PROVIDER_SITE_OTHER): Payer: Medicare PPO

## 2022-12-01 DIAGNOSIS — E538 Deficiency of other specified B group vitamins: Secondary | ICD-10-CM

## 2022-12-01 MED ORDER — CYANOCOBALAMIN 1000 MCG/ML IJ SOLN
1000.0000 ug | Freq: Once | INTRAMUSCULAR | Status: AC
Start: 2022-12-01 — End: 2022-12-01
  Administered 2022-12-01: 1000 ug via INTRAMUSCULAR

## 2022-12-01 NOTE — Progress Notes (Signed)
Pt here for B12 injection for Dr Parker. Injection given in right deltoid. Pt tolerated well.   

## 2022-12-14 ENCOUNTER — Other Ambulatory Visit: Payer: Self-pay | Admitting: Family Medicine

## 2022-12-25 ENCOUNTER — Telehealth: Payer: Self-pay | Admitting: Family Medicine

## 2022-12-25 DIAGNOSIS — W57XXXA Bitten or stung by nonvenomous insect and other nonvenomous arthropods, initial encounter: Secondary | ICD-10-CM | POA: Diagnosis not present

## 2022-12-25 DIAGNOSIS — S30861A Insect bite (nonvenomous) of abdominal wall, initial encounter: Secondary | ICD-10-CM | POA: Diagnosis not present

## 2022-12-25 DIAGNOSIS — S30860A Insect bite (nonvenomous) of lower back and pelvis, initial encounter: Secondary | ICD-10-CM | POA: Diagnosis not present

## 2022-12-25 DIAGNOSIS — S20469A Insect bite (nonvenomous) of unspecified back wall of thorax, initial encounter: Secondary | ICD-10-CM | POA: Diagnosis not present

## 2022-12-25 NOTE — Telephone Encounter (Signed)
FYI: This call has been transferred to Access Nurse. Once the result note has been entered staff can address the message at that time.  Patient called in with the following symptoms:  Red Word: Bug bites on back (10)  and stomach (2), red, swollen, and warm to the touch. One of the bites is streaking red down her back.    Please advise at  [phone]  Message is routed to Provider Pool and [facility] Triage

## 2022-12-25 NOTE — Telephone Encounter (Signed)
I monitored the team health folder for the rest of the day but never received a call or note from triage. Please Advise.

## 2022-12-26 ENCOUNTER — Other Ambulatory Visit: Payer: Self-pay | Admitting: Family Medicine

## 2022-12-28 NOTE — Telephone Encounter (Signed)
Called patient to follow up on triage status. Patient stated she was informed upon transfer that a nurse would call her within 60-90 minutes. Pt stated she knew she wouldn't be able to wait that long so she went to [facility] to get treated. Patient then informed me that when she got back home possibly around 7 she got a call from triage and informed then she no longer needed help.

## 2022-12-29 NOTE — Telephone Encounter (Signed)
FYI

## 2022-12-31 ENCOUNTER — Ambulatory Visit (INDEPENDENT_AMBULATORY_CARE_PROVIDER_SITE_OTHER): Payer: Medicare PPO

## 2022-12-31 DIAGNOSIS — E538 Deficiency of other specified B group vitamins: Secondary | ICD-10-CM

## 2022-12-31 MED ORDER — CYANOCOBALAMIN 1000 MCG/ML IJ SOLN
1000.0000 ug | Freq: Once | INTRAMUSCULAR | Status: AC
Start: 2022-12-31 — End: 2022-12-31
  Administered 2022-12-31: 1000 ug via INTRAMUSCULAR

## 2022-12-31 NOTE — Progress Notes (Signed)
Pt here for B12 injection for Dr Parker. Injection given in left deltoid. Pt tolerated well.   

## 2023-01-26 ENCOUNTER — Other Ambulatory Visit: Payer: Self-pay | Admitting: Family Medicine

## 2023-01-28 ENCOUNTER — Ambulatory Visit: Payer: Medicare PPO | Admitting: *Deleted

## 2023-02-15 ENCOUNTER — Other Ambulatory Visit: Payer: Self-pay | Admitting: Family Medicine

## 2023-03-08 ENCOUNTER — Other Ambulatory Visit: Payer: Self-pay | Admitting: Family Medicine

## 2023-03-16 ENCOUNTER — Other Ambulatory Visit: Payer: Self-pay | Admitting: *Deleted

## 2023-03-16 ENCOUNTER — Telehealth: Payer: Self-pay | Admitting: Family Medicine

## 2023-03-16 DIAGNOSIS — Z0000 Encounter for general adult medical examination without abnormal findings: Secondary | ICD-10-CM

## 2023-03-16 DIAGNOSIS — E7800 Pure hypercholesterolemia, unspecified: Secondary | ICD-10-CM

## 2023-03-16 NOTE — Telephone Encounter (Signed)
Patient is scheduled for CPE on 06/14/23 anfd wants blood work done prior so this can be discussed at time of CPE. Can these orders be placed?

## 2023-03-16 NOTE — Telephone Encounter (Signed)
Ok with me. Please place any necessary orders. 

## 2023-03-16 NOTE — Telephone Encounter (Signed)
Ok to placed order? 

## 2023-03-16 NOTE — Telephone Encounter (Signed)
Please schedule a lab visit prior to CPE

## 2023-03-26 ENCOUNTER — Other Ambulatory Visit: Payer: Self-pay | Admitting: Family Medicine

## 2023-04-07 ENCOUNTER — Ambulatory Visit: Payer: Medicare PPO | Admitting: Internal Medicine

## 2023-04-07 ENCOUNTER — Encounter: Payer: Self-pay | Admitting: Internal Medicine

## 2023-04-07 ENCOUNTER — Ambulatory Visit: Payer: Medicare PPO

## 2023-04-07 VITALS — BP 108/60 | HR 76 | Resp 16 | Ht 61.0 in | Wt 128.2 lb

## 2023-04-07 DIAGNOSIS — M810 Age-related osteoporosis without current pathological fracture: Secondary | ICD-10-CM

## 2023-04-07 DIAGNOSIS — E559 Vitamin D deficiency, unspecified: Secondary | ICD-10-CM

## 2023-04-07 NOTE — Progress Notes (Signed)
Patient ID: ASENAT EZEKIEL, female   DOB: May 14, 1946, 77 y.o.   MRN: [id number]   HPI  TAMEIKA SLIGH is a 77 y.o.-year-old female, initially referred by her PCP, Dr. Juluis Rainier, returning for follow-up for osteoporosis (OP).  I previously saw the patient - in 08/2015 for an elevated free T4.  Last visit 1 year ago.  Interim history: No falls or fractures since last visit. No dizziness/vertigo/orthostasis/poor vision. She has low back pain presumed from scoliosis. She is tearful at today's visit-son died 2022/06/06 after 4 years of illness. She was his caregiver.  Reviewed history: Pt was dx with OP in 06/07/15.  Reviewed DXA scan reports: Date L1-L3 T score FN T score 33% distal Radius  10/27/2021 ([facility])  -1.4 RFN: -2.6 LFN: -2.6 N/a  05/19/2019 ([facility])  -2.2 RFN: -2.8 LFN: -2.7 n/a  05/03/2017 ([facility])  -2.2 (-2.4 for comparison with 2020) RFN: -2.6 LFN: -2.9 n/a  01/17/2015 ([facility])  -1.8 RFN: -2.4 LFN: -2.6 n/a  2014  -1.8 RFN: -2.3 LFN: -2.4 n/a   She has a history of fractures of L tibia-fibula when stepping off a treadmill 02/2017 (had a rod placed) and also nasal bones at the same time.  Previous OP treatments:  - Fosamax 70 mg weekly off and on for 20 years - Prolia: She feels that this is working well for her. 11/14/2019 05/31/2020 12/04/2020 06/17/2021 04/01/2022 11/17/2022  At last visit, we checked her labs and her kidney function was still low (GFR 47), calcium and PTH levels were normal, vitamin D was normal, phosphorus slightly high, N-telopeptide suppressed: Component     Latest Ref Rng & Units 06/23/2019  Glucose     65 - 99 mg/dL 91  BUN     7 - 25 mg/dL 32 (H)  Creatinine     0.60 - 0.93 mg/dL 4.09 (H)  GFR, Est Non African American     > OR = 60 mL/min/1.28m2 47 (L)  Calcium     8.6 - 10.4 mg/dL 81.1  VITD     91.47 - 100.00 ng/mL 53.73  PTH, Intact     15 - 65 pg/mL 16  Phosphorus     2.3 - 4.6 mg/dL 4.7 (H)  NTX Telopeptide     6.2 - 19.0 nmol  BCE/L 6.3   Component     Latest Ref Rng 06/23/2019  NTX Telopeptide     6.2 - 19.0 nmol BCE/L 6.3    She has a history of vitamin D deficiency, but most recent levels were normal: Lab Results  Component Value Date   VD25OH 41.75 11/03/2022   VD25OH 49.96 04/06/2022   VD25OH 56.20 08/27/2021   VD25OH 42.65 02/11/2021   VD25OH 53.73 06/23/2019   VD25OH 41.72 08/19/2015  02/16/2019: Vitamin D 41.74 04/15/2018: Vitamin D 31.4  She is on vitamin D 1000 (she thinks) units daily.  No weight bearing exercises, but walks 4-5 times a week 2 miles a day with her husband.  She was getting steroid injections every 6 months in spine (DDD, spinal stenosis)-for years and stopped in June 06, 2016.  She does not take high vitamin A doses.  Menopause was at 77 years old.  She did not have HRT.  No FH of osteoporosis.  No hyper or hypocalcemia or hyperparathyroidism.  No history of kidney stones. Lab Results  Component Value Date   PTH 16 06/23/2019   CALCIUM 9.4 11/03/2022   CALCIUM 9.2 04/06/2022   CALCIUM 9.7 08/27/2021   CALCIUM  9.9 02/11/2021   CALCIUM 9.5 07/23/2020   CALCIUM 10.1 06/23/2019   CALCIUM 9.3 07/27/2017   CALCIUM 9.9 06/01/2017   CALCIUM 8.9 03/08/2017   CALCIUM 10.0 02/24/2017   24-hour urine calcium was normal: Component     Latest Ref Rng 07/03/2019  Creatinine, 24H Ur     0.50 - 2.15 g/24 h 0.78   Calcium, 24H Urine     mg/24 h 81    TFTs have been normal:  Lab Results  Component Value Date   TSH 0.76 11/03/2022   TSH 1.56 08/27/2021   TSH 1.15 02/11/2021   TSH 0.67 08/19/2015   TSH 0.94 08/23/2012   She has CKD stage III. Last BUN/Cr: Lab Results  Component Value Date   BUN 22 11/03/2022   CREATININE 1.35 (H) 11/03/2022  11/11/2018: 38/1.13, GFR 47  ROS: + see HPI  I reviewed pt's medications, allergies, PMH, social hx, family hx, and changes were documented in the history of present illness. Otherwise, unchanged from my initial visit note.  Past  Medical History:  Diagnosis Date   Anxiety    Arthritis    "back, neck" (03/08/2017)   Asthma in child    Bowel obstruction (HCC)    CAD (coronary artery disease), native coronary artery    Chronic lower back pain    Chronic neck pain    Closed left tibial fracture    Colon polyps    Complication of anesthesia    "I was awake during one of my surgeries" reports that she felt them start the incision and heard them talking reports happened ~ 35 years ago     Depression    Diverticulosis    Hyperlipidemia    Irritable bowel syndrome    Migraine    "used to be severe; now maybe 1/year or 2" (03/08/2017)   Pneumonia    H!N!   Past Surgical History:  Procedure Laterality Date   ANKLE HARDWARE REMOVAL Left 07/27/2017   Hardware removal left ankle, takedown non-union with allograft bone graft, Intramedullary Nail (Left)   APPENDECTOMY     AUGMENTATION MAMMAPLASTY Bilateral    BRACHIOPLASTY Bilateral    Arm Lift    CARDIAC CATHETERIZATION     CHOLECYSTECTOMY OPEN     COLON SURGERY     EXTERNAL FIXATION LEG Left 03/08/2017   Procedure: INTERNAL FIXATION LEFT ANKLE;  Surgeon: Nadara Mustard, MD;  Location: [facility] OR;  Service: Orthopedics;  Laterality: Left;   EYE MUSCLE SURGERY Bilateral X 2   "lazy eye; OR twice on each eye; Dr. Maple Hudson"   FRACTURE SURGERY     HARDWARE REMOVAL Left 07/27/2017   Procedure: HARDWARE REMOVAL;  Surgeon: Kathryne Hitch, MD;  Location: [facility] OR;  Service: Orthopedics;  Laterality: Left;   LEFT HEART CATH AND CORONARY ANGIOGRAPHY N/A 10/27/2016   Procedure: Left Heart Cath and Coronary Angiography;  Surgeon: Peter M Swaziland, MD;  Location: [facility] INVASIVE CV LAB;  Service: Cardiovascular;  Laterality: N/A;   LEFT HEART CATHETERIZATION WITH CORONARY ANGIOGRAM N/A 08/16/2014   Procedure: LEFT HEART CATHETERIZATION WITH CORONARY ANGIOGRAM;  Surgeon: Lesleigh Noe, MD;  Location: [facility] CATH LAB;  Service: Cardiovascular;  Laterality: N/A;   NASAL SINUS SURGERY      ORIF ANKLE FRACTURE Left 03/08/2017   ORIF ANKLE FRACTURE Left 07/27/2017   Procedure: Hardware removal left ankle, takedown non-union with allograft bone graft, Intramedullary Nail;  Surgeon: Kathryne Hitch, MD;  Location: [facility] OR;  Service: Orthopedics;  Laterality:  Left;   PARTIAL COLECTOMY  1970s   TONSILLECTOMY     Social History   Socioeconomic History   Marital status: Married    Spouse name: Not on file   Number of children: 2   Years of education: Not on file   Highest education level: Bachelor's degree (e.g., BA, AB, BS)  Occupational History   Occupation: Retired Printmaker)     Employer: RETIRED  Tobacco Use   Smoking status: Former    Current packs/day: 0.00    Average packs/day: 1 pack/day for 41.0 years (41.0 ttl pk-yrs)    Types: Cigarettes    Start date: 01/10/1969    Quit date: 01/10/2010    Years since quitting: 13.2   Smokeless tobacco: Never  Vaping Use   Vaping status: Former  Substance and Sexual Activity   Alcohol use: No   Drug use: No   Sexual activity: Not Currently    Partners: Female  Other Topics Concern   Not on file  Social History Narrative   [place]   Married '72-16 yrs/divorce; married '92   1 grandchild   Niece (jamie-Madeline's daughter, with 3 children and full time Consulting civil engineer)   Works as a Engineer, 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   Marriage in good health.      Physician roster   Gyn- Rock Nephew, NP   Opthal- Dr Maple Hudson (pediatric opthal)   GI- dr Tommye Standard- Dr Darrelyn Hillock   ENT- Dr Haroldine Laws            Social Determinants of Health   Financial Resource Strain: Low Risk  (08/20/2022)   Overall Financial Resource Strain (CARDIA)    Difficulty of Paying Living Expenses: Not hard at all  Food Insecurity: No Food Insecurity (08/20/2022)   Hunger Vital Sign    Worried About Running Out of Food in the Last Year: Never true    Ran Out of Food in the Last Year: Never true  Transportation Needs: No Transportation Needs (08/20/2022)    PRAPARE - Transportation    Lack of Transportation (Medical): No    Lack of Transportation (Non-Medical): No  Physical Activity: Inactive (08/20/2022)   Exercise Vital Sign    Days of Exercise per Week: 0 days    Minutes of Exercise per Session: 0 min  Stress: Stress Concern Present (08/20/2022)   [employer] of Occupational Health - Occupational Stress Questionnaire    Feeling of Stress : To some extent  Social Connections: Moderately Isolated (08/20/2022)   Social Connection and Isolation Panel [NHANES]    Frequency of Communication with Friends and Family: Twice a week    Frequency of Social Gatherings with Friends and Family: Twice a week    Attends Religious Services: Never    Database administrator or Organizations: No    Attends Banker Meetings: Never    Marital Status: Married  Catering manager Violence: Not At Risk (08/20/2022)   Humiliation, Afraid, Rape, and Kick questionnaire    Fear of Current or Ex-Partner: No    Emotionally Abused: No    Physically Abused: No    Sexually Abused: No   Current Outpatient Medications on File Prior to Visit  Medication Sig Dispense Refill   ALPRAZolam (XANAX) 0.5 MG tablet Take 1 tablet by mouth twice daily 60 tablet 5   buPROPion (WELLBUTRIN XL) 150 MG 24 hr tablet Take 1 tablet (150 mg total) by mouth daily. 90 tablet 3   ezetimibe (ZETIA) 10 MG tablet Take 1  tablet by mouth once daily 90 tablet 0   fenofibrate (TRICOR) 48 MG tablet Take 1 tablet by mouth once daily 90 tablet 0   FLUoxetine (PROZAC) 20 MG capsule Take 1 capsule (20 mg total) by mouth daily. Take 60mg  total daily 90 capsule 3   FLUoxetine (PROZAC) 40 MG capsule Take 1 capsule by mouth once daily 90 capsule 0   metroNIDAZOLE (METROCREAM) 0.75 % cream Apply topically 2 (two) times daily.     mirabegron ER (MYRBETRIQ) 50 MG TB24 tablet Take 1 tablet (50 mg total) by mouth daily. 90 tablet 3   Multiple Vitamin (MULTI-VITAMIN) tablet Take by mouth.      oxymetazoline (NASAL RELIEF) 0.05 % nasal spray 1 spray.     traMADol (ULTRAM) 50 MG tablet Take by mouth.     traZODone (DESYREL) 50 MG tablet TAKE 1 TABLET BY MOUTH AT BEDTIME 90 tablet 0   triamcinolone cream (KENALOG) 0.1 % Apply 1 application topically 2 (two) times daily.     No current facility-administered medications on file prior to visit.   Allergies  Allergen Reactions   Shellfish-Derived Products Anaphylaxis, Shortness Of Breath and Swelling    Throat swells; Iodine is ok   Lipitor [Atorvastatin] Nausea Only and Other (See Comments)    Fatigue and Lethargy. Leg, back,Joint, and muscle pain. Per patient "My stool was a muddy clay color and I having diarrhea" pt states "I never want to take another statin again"   Crestor [Rosuvastatin Calcium]     UNSPECIFIED REACTION    Evolocumab Other (See Comments)    Other reaction(s): rash   Metoprolol     Other reaction(s): very tired, blurred vision   Pravastatin     fatigue   Prednisone Other (See Comments)    Other reaction(s): insomnia, shakes   Zetia [Ezetimibe]     fatigue   Codeine Nausea And Vomiting   Family History  Problem Relation Age of Onset   Heart disease Mother        CHF   Coronary artery disease Mother    Hypertension Mother    Hyperlipidemia Mother    Heart disease Father        CHF   Coronary artery disease Father    Diabetes Sister        with all the complications   Coronary artery disease Sister    Stroke Sister 46       CVA   Peripheral vascular disease Sister    Colon cancer Maternal Grandfather    Healthy Son    Liver disease Son        liver failure    Cancer Neg Hx        breast or colon   PE: BP 108/60 (BP Location: Left Arm, Patient Position: Sitting, Cuff Size: Normal)   Pulse 76   Resp 16   Ht 5\' 1"  (1.549 m)   Wt 128 lb 3.2 oz (58.2 kg)   SpO2 96%   BMI 24.22 kg/m  Wt Readings from Last 3 Encounters:  04/07/23 128 lb 3.2 oz (58.2 kg)  11/17/22 126 lb 9.6 oz (57.4 kg)   11/06/22 128 lb 12.8 oz (58.4 kg)   Constitutional: normal weight, in NAD Eyes:  EOMI, no exophthalmos ENT: no neck masses, no cervical lymphadenopathy Cardiovascular: RRR, No MRG Respiratory: CTA B Musculoskeletal: no deformities Skin:no rashes Neurological: + tremor with outstretched hands  Assessment: 1. Osteoporosis  2.  Vitamin D deficiency  Plan: 1. Osteoporosis -At last  visit, she returned after almost 3 years absence, during which she continues to get Prolia. -Her osteoporosis is likely postmenopausal/age-related (she has a history of early menopause without HRT), also possibly steroid-induced (she had spinal steroid injections for many years, stopped in 2017) -Her latest bone mineral density was from 10/27/2021 [facility]): Spine bone density improved significantly, from -2.2 to -1.4.  Femoral neck bone densities are -2.6, slightly improved -In the past, we discussed about starting Forteo or Tymlos which I suggested but we ended up starting Prolia.  She has been getting this consistently, with the last dose being 11/17/2022.  She is feeling that this is working well for her and would like to continue.  We discussed about possibly continuing for 10 years or even more, if needed.  No jaw/thigh/hip pain. -No falls or fractures since last visit -She is walking 2 miles a day with cussed about adding weightbearing exercises -She will be due for a new DXA scan next year.  Unchanged or higher T-scores are desirable -Reviewed calcium level from 10/2022 and this was normal.  GFR was slightly low, but consistent with prior. - I we will see her back in 1 year  2.  Vitamin D deficiency -Latest vitamin D level was normal at last visit and she had another vitamin D level normal in 10/2022 -She continues on 1000 units vitamin D daily -She did have a history of fragility fracture in 2019 and this was slow to heal, but unclear whether this was related to vitamin D deficiency or not -We will recheck  a vitamin D level at next OV  Carlus Pavlov, MD PhD [facility]

## 2023-04-07 NOTE — Patient Instructions (Addendum)
Please continue Prolia.  You should have an endocrinology follow-up appointment in 1 year.

## 2023-04-17 NOTE — Telephone Encounter (Signed)
Prolia VOB initiated via [email or web address]  Next Prolia inj DUE: 05/21/23

## 2023-04-25 NOTE — Telephone Encounter (Signed)
Prior Auth REQUIRED and on file  Prior Auth: APPROVED PA Case ID #: [id number] Valid: 11/06/19-07/13/23

## 2023-05-12 ENCOUNTER — Other Ambulatory Visit: Payer: Self-pay | Admitting: Family Medicine

## 2023-05-13 MED ORDER — DENOSUMAB 60 MG/ML ~~LOC~~ SOSY
60.0000 mg | PREFILLED_SYRINGE | Freq: Once | SUBCUTANEOUS | Status: AC
Start: 2023-05-21 — End: ?

## 2023-05-13 NOTE — Addendum Note (Signed)
Addended by: Dierdre Searles on: 05/13/2023 09:00 PM   Modules accepted: Orders

## 2023-05-13 NOTE — Telephone Encounter (Signed)
Pt ready for scheduling on or after 05/21/23  Out-of-pocket cost due at time of visit: $40  Primary: [employer] SHP PPO Prolia co-insurance: $40 Admin fee co-insurance: 0%  Deductible: does not apply  Prior Auth: APPROVED PA Case ID #: [id number] Valid: 11/06/19-07/13/23  Secondary: N/A Prolia co-insurance:  Admin fee co-insurance:  Deductible:  Prior Auth:  PA# Valid:     ** This summary of benefits is an estimation of the patient's out-of-pocket cost. Exact cost may very based on individual plan coverage.

## 2023-05-21 ENCOUNTER — Ambulatory Visit: Payer: Medicare PPO

## 2023-05-26 ENCOUNTER — Ambulatory Visit: Payer: Medicare PPO

## 2023-05-27 ENCOUNTER — Other Ambulatory Visit: Payer: Self-pay | Admitting: Family Medicine

## 2023-05-28 ENCOUNTER — Ambulatory Visit: Payer: Medicare PPO

## 2023-05-28 DIAGNOSIS — M810 Age-related osteoporosis without current pathological fracture: Secondary | ICD-10-CM | POA: Diagnosis not present

## 2023-05-28 MED ORDER — DENOSUMAB 60 MG/ML ~~LOC~~ SOSY
60.0000 mg | PREFILLED_SYRINGE | Freq: Once | SUBCUTANEOUS | Status: AC
Start: 2023-11-25 — End: 2023-12-07
  Administered 2023-12-07: 60 mg via SUBCUTANEOUS

## 2023-05-28 MED ORDER — DENOSUMAB 60 MG/ML ~~LOC~~ SOSY
60.0000 mg | PREFILLED_SYRINGE | Freq: Once | SUBCUTANEOUS | Status: AC
Start: 2023-05-28 — End: 2023-05-28
  Administered 2023-05-28: 60 mg via SUBCUTANEOUS

## 2023-05-28 NOTE — Progress Notes (Signed)
After obtaining consent, and per orders of Dr. Elvera Lennox, injection of Prolia given by Pollie Meyer. Patient instructed to remain in clinic for 20 minutes afterwards, and to report any adverse reaction to me immediately.

## 2023-06-02 NOTE — Telephone Encounter (Signed)
Last Prolia inj 05/28/23 Next Prolia inj due 11/26/23

## 2023-06-03 ENCOUNTER — Ambulatory Visit: Payer: Medicare PPO | Admitting: Physician Assistant

## 2023-06-03 ENCOUNTER — Encounter: Payer: Self-pay | Admitting: Physician Assistant

## 2023-06-03 ENCOUNTER — Other Ambulatory Visit: Payer: Medicare PPO

## 2023-06-03 VITALS — BP 116/75 | HR 76 | Temp 97.8°F | Ht 61.0 in | Wt 126.2 lb

## 2023-06-03 DIAGNOSIS — J342 Deviated nasal septum: Secondary | ICD-10-CM | POA: Diagnosis not present

## 2023-06-03 DIAGNOSIS — Z0000 Encounter for general adult medical examination without abnormal findings: Secondary | ICD-10-CM

## 2023-06-03 DIAGNOSIS — H6993 Unspecified Eustachian tube disorder, bilateral: Secondary | ICD-10-CM

## 2023-06-03 DIAGNOSIS — E860 Dehydration: Secondary | ICD-10-CM

## 2023-06-03 DIAGNOSIS — J309 Allergic rhinitis, unspecified: Secondary | ICD-10-CM | POA: Diagnosis not present

## 2023-06-03 DIAGNOSIS — R0982 Postnasal drip: Secondary | ICD-10-CM

## 2023-06-03 DIAGNOSIS — E7800 Pure hypercholesterolemia, unspecified: Secondary | ICD-10-CM

## 2023-06-03 LAB — CBC
HCT: 42.1 % (ref 36.0–46.0)
Hemoglobin: 14 g/dL (ref 12.0–15.0)
MCHC: 33.2 g/dL (ref 30.0–36.0)
MCV: 92.1 fL (ref 78.0–100.0)
Platelets: 305 10*3/uL (ref 150.0–400.0)
RBC: 4.57 Mil/uL (ref 3.87–5.11)
RDW: 13.4 % (ref 11.5–15.5)
WBC: 6.2 10*3/uL (ref 4.0–10.5)

## 2023-06-03 LAB — LIPID PANEL
Cholesterol: 194 mg/dL (ref 0–200)
HDL: 68.3 mg/dL (ref >39.00)
LDL Cholesterol: 113 mg/dL — ABNORMAL HIGH (ref 0–99)
NonHDL: 125.75
Total CHOL/HDL Ratio: 3
Triglycerides: 63 mg/dL (ref 0.0–149.0)
VLDL: 12.6 mg/dL (ref 0.0–40.0)

## 2023-06-03 LAB — COMPREHENSIVE METABOLIC PANEL
ALT: 10 U/L (ref 0–35)
AST: 18 U/L (ref 0–37)
Albumin: 4.5 g/dL (ref 3.5–5.2)
Alkaline Phosphatase: 15 U/L — ABNORMAL LOW (ref 39–117)
BUN: 26 mg/dL — ABNORMAL HIGH (ref 6–23)
CO2: 25 meq/L (ref 19–32)
Calcium: 9.6 mg/dL (ref 8.4–10.5)
Chloride: 104 meq/L (ref 96–112)
Creatinine, Ser: 1.28 mg/dL — ABNORMAL HIGH (ref 0.40–1.20)
GFR: 40.96 mL/min — ABNORMAL LOW (ref >60.00)
Glucose, Bld: 108 mg/dL — ABNORMAL HIGH (ref 70–99)
Potassium: 4.2 meq/L (ref 3.5–5.1)
Sodium: 139 meq/L (ref 135–145)
Total Bilirubin: 0.5 mg/dL (ref 0.2–1.2)
Total Protein: 6.9 g/dL (ref 6.0–8.3)

## 2023-06-03 LAB — TSH: TSH: 2.24 u[IU]/mL (ref 0.35–5.50)

## 2023-06-03 LAB — BASIC METABOLIC PANEL
BUN: 26 mg/dL — ABNORMAL HIGH (ref 6–23)
CO2: 25 meq/L (ref 19–32)
Calcium: 9.6 mg/dL (ref 8.4–10.5)
Chloride: 104 meq/L (ref 96–112)
Creatinine, Ser: 1.28 mg/dL — ABNORMAL HIGH (ref 0.40–1.20)
GFR: 40.96 mL/min — ABNORMAL LOW (ref >60.00)
Glucose, Bld: 108 mg/dL — ABNORMAL HIGH (ref 70–99)
Potassium: 4.2 meq/L (ref 3.5–5.1)
Sodium: 139 meq/L (ref 135–145)

## 2023-06-03 LAB — HEMOGLOBIN A1C: Hgb A1c MFr Bld: 5.8 % (ref 4.6–6.5)

## 2023-06-03 MED ORDER — MONTELUKAST SODIUM 10 MG PO TABS
5.0000 mg | ORAL_TABLET | Freq: Every day | ORAL | 2 refills | Status: AC
Start: 2023-06-03 — End: 2024-03-30

## 2023-06-03 MED ORDER — TRIAMCINOLONE ACETONIDE 55 MCG/ACT NA AERO: 2.0000 | INHALATION_SPRAY | Freq: Every day | NASAL | 12 refills | Status: DC
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
## 2023-06-03 NOTE — Patient Instructions (Signed)
I think you are experiencing nasal allergies and something called eustachian tube dysfunction.   Let's try Singulair 10 mg at bedtime - start with 1/2 tab for 1-2 weeks, then increase to full tab if tolerating well. Watch for any mood changes.  Nasacort nasal spray two sprays each nostril daily.  Nasal saline rinses as needed.  Referral to allergy since symptoms have been going on so long.  Consider ENT referral as well if worse or not improving.

## 2023-06-07 NOTE — Progress Notes (Signed)
Cholesterol up a little since last year. Is she still on Repatha?  I know she has had issues with statins in the past however we need to try to get this back to goal.  We can discuss more at her upcoming office visit.  Rest of her labs are all stable and we can recheck everything else in year or so.

## 2023-06-08 ENCOUNTER — Other Ambulatory Visit: Payer: Self-pay | Admitting: Family Medicine

## 2023-06-14 ENCOUNTER — Telehealth: Payer: Self-pay | Admitting: Family Medicine

## 2023-06-14 ENCOUNTER — Ambulatory Visit (INDEPENDENT_AMBULATORY_CARE_PROVIDER_SITE_OTHER): Payer: Medicare PPO | Admitting: Family Medicine

## 2023-06-14 ENCOUNTER — Encounter: Payer: Self-pay | Admitting: Family Medicine

## 2023-06-14 VITALS — BP 127/81 | HR 80 | Temp 97.5°F | Ht 61.0 in | Wt 125.8 lb

## 2023-06-14 DIAGNOSIS — F419 Anxiety disorder, unspecified: Secondary | ICD-10-CM

## 2023-06-14 DIAGNOSIS — Z0001 Encounter for general adult medical examination with abnormal findings: Secondary | ICD-10-CM | POA: Diagnosis not present

## 2023-06-14 DIAGNOSIS — E7800 Pure hypercholesterolemia, unspecified: Secondary | ICD-10-CM

## 2023-06-14 DIAGNOSIS — N183 Chronic kidney disease, stage 3 unspecified: Secondary | ICD-10-CM

## 2023-06-14 DIAGNOSIS — F320 Major depressive disorder, single episode, mild: Secondary | ICD-10-CM

## 2023-06-14 DIAGNOSIS — M810 Age-related osteoporosis without current pathological fracture: Secondary | ICD-10-CM | POA: Diagnosis not present

## 2023-06-14 NOTE — Assessment & Plan Note (Signed)
Stable on recent labs.  Recheck 6 months.

## 2023-06-14 NOTE — Telephone Encounter (Signed)
When pt calls to schedule lab appt in 6 mos, NEEDS to be 6 mo follow up with [name] & then he will put in labs that day.

## 2023-06-14 NOTE — Patient Instructions (Addendum)
It was very nice to see you today!  I will refer you to see a therapist.  Please continue to work on diet and exercise.  Return in about 6 months (around 12/13/2023).   Take care, Dr Jimmey Ralph  PLEASE NOTE:  If you had any lab tests, please let us know if you have not heard back within a few days. You may see your results on mychart before we have a chance to review them but we will give you a call once they are reviewed by [place].   If we ordered any referrals today, please let us know if you have not heard from their office within the next week.   If you had any urgent prescriptions sent in today, please check with the pharmacy within an hour of our visit to make sure the prescription was transmitted appropriately.   Please try these tips to maintain a healthy lifestyle:  Eat at least 3 REAL meals and 1-2 snacks per day.  Aim for no more than 5 hours between eating.  If you eat breakfast, please do so within one hour of getting up.   Each meal should contain half fruits/vegetables, one quarter protein, and one quarter carbs (no bigger than a computer mouse)  Cut down on sweet beverages. This includes juice, soda, and sweet tea.   Drink at least 1 glass of water with each meal and aim for at least 8 glasses per day  Exercise at least 150 minutes every week.    Preventive Care 67 Years and Older, Female Preventive care refers to lifestyle choices and visits with your health care provider that can promote health and wellness. Preventive care visits are also called wellness exams. What can I expect for my preventive care visit? Counseling Your health care provider may ask you questions about your: Medical history, including: Past medical problems. Family medical history. Pregnancy and menstrual history. History of falls. Current health, including: Memory and ability to understand (cognition). Emotional well-being. Home life and relationship well-being. Sexual activity and sexual  health. Lifestyle, including: Alcohol, nicotine or tobacco, and drug use. Access to firearms. Diet, exercise, and sleep habits. Work and work Astronomer. Sunscreen use. Safety issues such as seatbelt and bike helmet use. Physical exam Your health care provider will check your: Height and weight. These may be used to calculate your BMI (body mass index). BMI is a measurement that tells if you are at a healthy weight. Waist circumference. This measures the distance around your waistline. This measurement also tells if you are at a healthy weight and may help predict your risk of certain diseases, such as type 2 diabetes and high blood pressure. Heart rate and blood pressure. Body temperature. Skin for abnormal spots. What immunizations do I need?  Vaccines are usually given at various ages, according to a schedule. Your health care provider will recommend vaccines for you based on your age, medical history, and lifestyle or other factors, such as travel or where you work. What tests do I need? Screening Your health care provider may recommend screening tests for certain conditions. This may include: Lipid and cholesterol levels. Hepatitis C test. Hepatitis B test. HIV (human immunodeficiency virus) test. STI (sexually transmitted infection) testing, if you are at risk. Lung cancer screening. Colorectal cancer screening. Diabetes screening. This is done by checking your blood sugar (glucose) after you have not eaten for a while (fasting). Mammogram. Talk with your health care provider about how often you should have regular mammograms. BRCA-related cancer  screening. This may be done if you have a family history of breast, ovarian, tubal, or peritoneal cancers. Bone density scan. This is done to screen for osteoporosis. Talk with your health care provider about your test results, treatment options, and if necessary, the need for more tests. Follow these instructions at home: Eating and  drinking  Eat a diet that includes fresh fruits and vegetables, whole grains, lean protein, and low-fat dairy products. Limit your intake of foods with high amounts of sugar, saturated fats, and salt. Take vitamin and mineral supplements as recommended by your health care provider. Do not drink alcohol if your health care provider tells you not to drink. If you drink alcohol: Limit how much you have to 0-1 drink a day. Know how much alcohol is in your drink. In the [place] one drink equals one 12 oz bottle of beer (355 mL), one 5 oz glass of wine (148 mL), or one 1 oz glass of hard liquor (44 mL). Lifestyle Brush your teeth every morning and night with fluoride toothpaste. Floss one time each day. Exercise for at least 30 minutes 5 or more days each week. Do not use any products that contain nicotine or tobacco. These products include cigarettes, chewing tobacco, and vaping devices, such as e-cigarettes. If you need help quitting, ask your health care provider. Do not use drugs. If you are sexually active, practice safe sex. Use a condom or other form of protection in order to prevent STIs. Take aspirin only as told by your health care provider. Make sure that you understand how much to take and what form to take. Work with your health care provider to find out whether it is safe and beneficial for you to take aspirin daily. Ask your health care provider if you need to take a cholesterol-lowering medicine (statin). Find healthy ways to manage stress, such as: Meditation, yoga, or listening to music. Journaling. Talking to a trusted person. Spending time with friends and family. Minimize exposure to UV radiation to reduce your risk of skin cancer. Safety Always wear your seat belt while driving or riding in a vehicle. Do not drive: If you have been drinking alcohol. Do not ride with someone who has been drinking. When you are tired or distracted. While texting. If you have been using any  mind-altering substances or drugs. Wear a helmet and other protective equipment during sports activities. If you have firearms in your house, make sure you follow all gun safety procedures. What's next? Visit your health care provider once a year for an annual wellness visit. Ask your health care provider how often you should have your eyes and teeth checked. Stay up to date on all vaccines. This information is not intended to replace advice given to you by your health care provider. Make sure you discuss any questions you have with your health care provider. Document Revised: 12/25/2020 Document Reviewed: 12/25/2020 Elsevier Patient Education  2024 [employer].

## 2023-06-14 NOTE — Assessment & Plan Note (Signed)
Prolia every 6 months per endocrinology.

## 2023-06-14 NOTE — Assessment & Plan Note (Signed)
Last LDL above goal.  She recently restarted Repatha.  She is on Zetia and fenofibrate.  She does not wish to make any medication changes today.  She will work on lifestyle inventions.  Follow-up in 6 months.  We can recheck at that time.

## 2023-06-14 NOTE — Progress Notes (Signed)
Chief Complaint:  [name] is a 77 y.o. female who presents today for her annual comprehensive physical exam.    Assessment/Plan:  Chronic Problems Addressed Today: Depression Patient has been under quite a bit of stress recently due to the 1 year anniversary of her son passing away as well as her husband being recently diagnosed with bladder cancer.  Does admit this has been a very stressful period for her. No SI or HI. She is currently on Prozac 60 mg daily.  We did discuss changing medications or adding on Wellbutrin however she would like to continue with current medications for now. She is interested in seeing a therapist.  Will place referral today.  We discussed reasons to return to care and seek emergent care.  She will let us know if she needs any further assistance.  Hyperlipidemia Last LDL above goal.  She recently restarted Repatha.  She is on Zetia and fenofibrate.  She does not wish to make any medication changes today.  She will work on lifestyle inventions.  Follow-up in 6 months.  We can recheck at that time.  Anxiety See depression A/P.  She has been under a lot of stress recently.  We will be continuing Prozac 60 mg daily and referring her to see a therapist.  She can also continue taking Xanax 0.5 mg 2-3 times daily as needed.  She will follow-up with [place] in person in 6 months though she will let us know if she has any worsening symptoms of if she needs any further assistance in the meantime.  Stage 3 chronic kidney disease Stable on recent labs.  Recheck 6 months.  Osteoporosis Prolia every 6 months per endocrinology.  Preventative Healthcare: Up-to-date on colon cancer screening.  Follows with gynecology for women's health.  Up-to-date on pneumonia vaccine.  She can get shingles vaccine at the pharmacy.  Patient Counseling(The following topics were reviewed and/or handout was given):  -Nutrition: Stressed importance of moderation in sodium/caffeine intake,  saturated fat and cholesterol, caloric balance, sufficient intake of fresh fruits, vegetables, and fiber.  -Stressed the importance of regular exercise.   -Substance Abuse: Discussed cessation/primary prevention of tobacco, alcohol, or other drug use; driving or other dangerous activities under the influence; availability of treatment for abuse.   -Injury prevention: Discussed safety belts, safety helmets, smoke detector, smoking near bedding or upholstery.   -Sexuality: Discussed sexually transmitted diseases, partner selection, use of condoms, avoidance of unintended pregnancy and contraceptive alternatives.   -Dental health: Discussed importance of regular tooth brushing, flossing, and dental visits.  -Health maintenance and immunizations reviewed. Please refer to Health maintenance section.  Return to care in 1 year for next preventative visit.     Subjective:  HPI:  She has no acute complaints today. She high deductible savings account been under more stress recently due to her husband recently being diagnosed with bladder cancer. She also just passed the one year mark of her son passing away which has also been very emotional for her.   Lifestyle Diet: None specific.  Exercise: Tries to walk routinely.      06/14/2023   10:05 AM  Depression screen PHQ 2/9  Decreased Interest 0  Down, Depressed, Hopeless 0  PHQ - 2 Score 0    There are no preventive care reminders to display for this patient.   ROS: Per HPI, otherwise a complete review of systems was negative.   PMH:  The following were reviewed and entered/updated in epic: Past Medical  History:  Diagnosis Date   Anxiety    Arthritis    "back, neck" (03/08/2017)   Asthma in child    Bowel obstruction (HCC)    CAD (coronary artery disease), native coronary artery    Chronic lower back pain    Chronic neck pain    Closed left tibial fracture    Colon polyps    Complication of anesthesia    "I was awake during one of  my surgeries" reports that she felt them start the incision and heard them talking reports happened ~ 35 years ago     Depression    Diverticulosis    Hyperlipidemia    Irritable bowel syndrome    Migraine    "used to be severe; now maybe 1/year or 2" (03/08/2017)   Pneumonia    H!N!   Patient Active Problem List   Diagnosis Date Noted   B12 deficiency 11/06/2022   Chronic low back pain 11/25/2021   Urge incontinence 02/11/2021   Osteoporosis 05/31/2019   Statin myopathy 05/31/2019   Essential tremor 05/31/2019   History of colonic polyps 05/31/2019   Stage 3 chronic kidney disease (HCC) 05/31/2019   Vitamin D deficiency, unspecified 05/31/2019   Closed displaced fracture of nasal bone with routine healing 04/13/2017   CAD (coronary artery disease), native coronary artery    Anxiety    Depression    Hyperlipidemia    Past Surgical History:  Procedure Laterality Date   ANKLE HARDWARE REMOVAL Left 07/27/2017   Hardware removal left ankle, takedown non-union with allograft bone graft, Intramedullary Nail (Left)   APPENDECTOMY     AUGMENTATION MAMMAPLASTY Bilateral    BRACHIOPLASTY Bilateral    Arm Lift    CARDIAC CATHETERIZATION     CHOLECYSTECTOMY OPEN     COLON SURGERY     EXTERNAL FIXATION LEG Left 03/08/2017   Procedure: INTERNAL FIXATION LEFT ANKLE;  Surgeon: Nadara Mustard, MD;  Location: [facility] OR;  Service: Orthopedics;  Laterality: Left;   EYE MUSCLE SURGERY Bilateral X 2   "lazy eye; OR twice on each eye; Dr. Maple Hudson"   FRACTURE SURGERY     HARDWARE REMOVAL Left 07/27/2017   Procedure: HARDWARE REMOVAL;  Surgeon: Kathryne Hitch, MD;  Location[facility] OR;  Service: Orthopedics;  Laterality: Left;   LEFT HEART CATH AND CORONARY ANGIOGRAPHY N/A 10/27/2016   Procedure: Left Heart Cath and Coronary Angiography;  Surgeon: Peter M Swaziland, MD;  Location: [facility] INVASIVE CV LAB;  Service: Cardiovascular;  Laterality: N/A;   LEFT HEART CATHETERIZATION WITH CORONARY ANGIOGRAM N/A  08/16/2014   Procedure: LEFT HEART CATHETERIZATION WITH CORONARY ANGIOGRAM;  Surgeon: Lesleigh Noe, MD;  Location: [facility] CATH LAB;  Service: Cardiovascular;  Laterality: N/A;   NASAL SINUS SURGERY     ORIF ANKLE FRACTURE Left 03/08/2017   ORIF ANKLE FRACTURE Left 07/27/2017   Procedure: Hardware removal left ankle, takedown non-union with allograft bone graft, Intramedullary Nail;  Surgeon: Kathryne Hitch, MD;  Location: [facility] OR;  Service: Orthopedics;  Laterality: Left;   PARTIAL COLECTOMY  1970s   TONSILLECTOMY     Family History  Problem Relation Age of Onset   Heart disease Mother        CHF   Coronary artery disease Mother    Hypertension Mother    Hyperlipidemia Mother    Heart disease Father        CHF   Coronary artery disease Father    Diabetes Sister  with all the complications   Coronary artery disease Sister    Stroke Sister 39       CVA   Peripheral vascular disease Sister    Colon cancer Maternal Grandfather    Healthy Son    Liver disease Son        liver failure    Cancer Neg Hx        breast or colon    Medications- reviewed and updated Current Outpatient Medications  Medication Sig Dispense Refill   ALPRAZolam (XANAX) 0.5 MG tablet Take 1 tablet by mouth twice daily 60 tablet 5   ezetimibe (ZETIA) 10 MG tablet Take 1 tablet by mouth once daily 90 tablet 0   fenofibrate (TRICOR) 48 MG tablet Take 1 tablet by mouth once daily 90 tablet 0   FLUoxetine (PROZAC) 20 MG capsule Take 1 capsule (20 mg total) by mouth daily. Take 60mg  total daily 90 capsule 3   FLUoxetine (PROZAC) 40 MG capsule Take 1 capsule by mouth once daily 90 capsule 0   metroNIDAZOLE (METROCREAM) 0.75 % cream Apply topically 2 (two) times daily.     mirabegron ER (MYRBETRIQ) 50 MG TB24 tablet Take 1 tablet (50 mg total) by mouth daily. 90 tablet 3   montelukast (SINGULAIR) 10 MG tablet Take 0.5 tablets (5 mg total) by mouth at bedtime. 15 tablet 2   Multiple Vitamin  (MULTI-VITAMIN) tablet Take by mouth.     oxymetazoline (NASAL RELIEF) 0.05 % nasal spray 1 spray.     traMADol (ULTRAM) 50 MG tablet Take by mouth.     traZODone (DESYREL) 50 MG tablet TAKE 1 TABLET BY MOUTH AT BEDTIME 90 tablet 0   triamcinolone (NASACORT) 55 MCG/ACT AERO nasal inhaler Place 2 sprays into the nose daily. 1 each 12   triamcinolone cream (KENALOG) 0.1 % Apply 1 application topically 2 (two) times daily.     Current Facility-Administered Medications  Medication Dose Route Frequency Provider Last Rate Last Admin   denosumab (PROLIA) injection 60 mg  60 mg Subcutaneous Once Carlus Pavlov, MD       Melene Muller ON 11/25/2023] denosumab (PROLIA) injection 60 mg  60 mg Subcutaneous Once Carlus Pavlov, MD        Allergies-reviewed and updated Allergies  Allergen Reactions   Shellfish-Derived Products Anaphylaxis, Shortness Of Breath and Swelling    Throat swells; Iodine is ok   Lipitor [Atorvastatin] Nausea Only and Other (See Comments)    Fatigue and Lethargy. Leg, back,Joint, and muscle pain. Per patient "My stool was a muddy clay color and I having diarrhea" pt states "I never want to take another statin again"   Crestor [Rosuvastatin Calcium]     UNSPECIFIED REACTION    Evolocumab Other (See Comments)    Other reaction(s): rash   Hydrocodone-Acetaminophen Other (See Comments)   Metoprolol     Other reaction(s): very tired, blurred vision   Pravastatin     fatigue   Prednisone Other (See Comments)    Other reaction(s): insomnia, shakes   Zetia [Ezetimibe]     fatigue   Codeine Nausea And Vomiting    Social History   Socioeconomic History   Marital status: Married    Spouse name: Not on file   Number of children: 2   Years of education: Not on file   Highest education level: Bachelor's degree (e.g., BA, AB, BS)  Occupational History   Occupation: Retired Printmaker)     Employer: RETIRED  Tobacco Use   Smoking status: Former  Current packs/day: 0.00     Average packs/day: 1 pack/day for 41.0 years (41.0 ttl pk-yrs)    Types: Cigarettes    Start date: 01/10/1969    Quit date: 01/10/2010    Years since quitting: 13.4   Smokeless tobacco: Never  Vaping Use   Vaping status: Former  Substance and Sexual Activity   Alcohol use: No   Drug use: No   Sexual activity: Not Currently    Partners: Female  Other Topics Concern   Not on file  Social History Narrative   [place]   Married '72-16 yrs/divorce; married '92   1 grandchild   Niece (jamie-Madeline's daughter, with 3 children and full time Consulting civil engineer)   Works as a Engineer, 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   Marriage in good health.      Physician roster   Gyn- Rock Nephew, NP   Opthal- Dr Maple Hudson (pediatric opthal)   GI- dr Tommye Standard- Dr Darrelyn Hillock   ENT- Dr Haroldine Laws            Social Determinants of Health   Financial Resource Strain: Low Risk  (08/20/2022)   Overall Financial Resource Strain (CARDIA)    Difficulty of Paying Living Expenses: Not hard at all  Food Insecurity: No Food Insecurity (08/20/2022)   Hunger Vital Sign    Worried About Running Out of Food in the Last Year: Never true    Ran Out of Food in the Last Year: Never true  Transportation Needs: No Transportation Needs (08/20/2022)   PRAPARE - Transportation    Lack of Transportation (Medical): No    Lack of Transportation (Non-Medical): No  Physical Activity: Inactive (08/20/2022)   Exercise Vital Sign    Days of Exercise per Week: 0 days    Minutes of Exercise per Session: 0 min  Stress: Stress Concern Present (08/20/2022)   [employer] of Occupational Health - Occupational Stress Questionnaire    Feeling of Stress : To some extent  Social Connections: Moderately Isolated (08/20/2022)   Social Connection and Isolation Panel [NHANES]    Frequency of Communication with Friends and Family: Twice a week    Frequency of Social Gatherings with Friends and Family: Twice a week    Attends Religious Services: Never     Database administrator or Organizations: No    Attends Engineer, structural: Never    Marital Status: Married        Objective:  Physical Exam: BP 127/81   Pulse 80   Temp (!) 97.5 F (36.4 C) (Temporal)   Ht 5\' 1"  (1.549 m)   Wt 125 lb 12.8 oz (57.1 kg)   SpO2 96%   BMI 23.77 kg/m   Body mass index is 23.77 kg/m. Wt Readings from Last 3 Encounters:  06/14/23 125 lb 12.8 oz (57.1 kg)  06/03/23 126 lb 3.2 oz (57.2 kg)  04/07/23 128 lb 3.2 oz (58.2 kg)   Gen: NAD, resting comfortably HEENT: TMs normal bilaterally. OP clear. No thyromegaly noted.  CV: RRR with no murmurs appreciated Pulm: NWOB, CTAB with no crackles, wheezes, or rhonchi GI: Normal bowel sounds present. Soft, Nontender, Nondistended. MSK: no edema, cyanosis, or clubbing noted Skin: warm, dry Neuro: CN2-12 grossly intact. Strength 5/5 in upper and lower extremities. Reflexes symmetric and intact bilaterally.  Psych: Normal affect and thought content     Margaux Engen M. Jimmey Ralph, MD 06/14/2023 10:43 AM

## 2023-06-14 NOTE — Assessment & Plan Note (Signed)
See depression A/P.  She has been under a lot of stress recently.  We will be continuing Prozac 60 mg daily and referring her to see a therapist.  She can also continue taking Xanax 0.5 mg 2-3 times daily as needed.  She will follow-up with [place] in person in 6 months though she will let us know if she has any worsening symptoms of if she needs any further assistance in the meantime.

## 2023-06-14 NOTE — Assessment & Plan Note (Addendum)
Patient has been under quite a bit of stress recently due to the 1 year anniversary of her son passing away as well as her husband being recently diagnosed with bladder cancer.  Does admit this has been a very stressful period for her. No SI or HI. She is currently on Prozac 60 mg daily.  We did discuss changing medications or adding on Wellbutrin however she would like to continue with current medications for now. She is interested in seeing a therapist.  Will place referral today.  We discussed reasons to return to care and seek emergent care.  She will let us know if she needs any further assistance.

## 2023-06-18 ENCOUNTER — Other Ambulatory Visit: Payer: Self-pay | Admitting: Family Medicine

## 2023-07-18 ENCOUNTER — Other Ambulatory Visit: Payer: Self-pay | Admitting: Family Medicine

## 2023-07-23 ENCOUNTER — Encounter: Payer: Self-pay | Admitting: Family Medicine

## 2023-07-23 ENCOUNTER — Ambulatory Visit: Payer: Medicare PPO | Admitting: Family Medicine

## 2023-07-23 VITALS — BP 107/75 | HR 82 | Temp 96.8°F | Ht 61.0 in | Wt 126.0 lb

## 2023-07-23 DIAGNOSIS — F419 Anxiety disorder, unspecified: Secondary | ICD-10-CM

## 2023-07-23 DIAGNOSIS — R059 Cough, unspecified: Secondary | ICD-10-CM | POA: Diagnosis not present

## 2023-07-23 DIAGNOSIS — F320 Major depressive disorder, single episode, mild: Secondary | ICD-10-CM

## 2023-07-23 MED ORDER — AMOXICILLIN-POT CLAVULANATE 875-125 MG PO TABS: 1.0000 | ORAL_TABLET | Freq: Two times a day (BID) | ORAL | 0 refills | Status: DC

## 2023-07-23 NOTE — Progress Notes (Signed)
   [name] is a 78 y.o. female who presents today for an office visit.  Assessment/Plan:  New/Acute Problems: Cough / Sinusitis  Reassuring lung exam today.  Cough is likely due to postnasal drip due to sinusitis.  Would be reasonable for us  to check chest x-ray at this point given that symptoms have been going on for multiple months and the fact that she is a former smoker.  We will also treat her sinusitis with a course of Augmentin .  If sinusitis symptoms do not improve with this would consider referral to ENT.  Depending on response to above and results of chest x-ray he may need referral to pulmonology if cough persists.  Chronic Problems Addressed Today: Depression Under more stress recently due to her husband's recent bladder cancer diagnosis.  She is currently on Prozac  60 mg daily.  Does not wish to make any medication changes today.  Anxiety See depression A/P.  She has been under more stress recently due to her husband being diagnosed with bladder cancer.  She is on Prozac  60 mg daily and alprazolam  0.5 mg 2-3 times daily as needed.  She does not wish to make any medication adjustments at this point.  She will let us  know if she needs any assistance between now and her next visit.    Subjective:  HPI:  See Assessment / plan for status of chronic conditions.  Patient here today with cough for the last 3 months. She is still having a lot of congestion and drainage. No fevers or chills. Cough is non productive.  No treatments tried.  She has been under more stress recently as her husband was recently diagnosed with bladder cancer.  No shortness of breath.  No chest pain       Objective:  Physical Exam: BP 107/75   Pulse 82   Temp (!) 96.8 F (36 C) (Temporal)   Ht 5' 1 (1.549 m)   Wt 126 lb (57.2 kg)   SpO2 97%   BMI 23.81 kg/m   Gen: No acute distress, resting comfortably HEENT: TMs clear.  OP erythematous. CV: Regular rate and rhythm with no murmurs  appreciated Pulm: Normal work of breathing, clear to auscultation bilaterally with no crackles, wheezes, or rhonchi Neuro: Grossly normal, moves all extremities Psych: Normal affect and thought content      [name] M. Kennyth, MD 07/23/2023 11:32 AM

## 2023-07-23 NOTE — Assessment & Plan Note (Signed)
 Under more stress recently due to her husband's recent bladder cancer diagnosis.  She is currently on Prozac 60 mg daily.  Does not wish to make any medication changes today.

## 2023-07-23 NOTE — Assessment & Plan Note (Signed)
 See depression A/P.  She has been under more stress recently due to her husband being diagnosed with bladder cancer.  She is on Prozac  60 mg daily and alprazolam  0.5 mg 2-3 times daily as needed.  She does not wish to make any medication adjustments at this point.  She will let us  know if she needs any assistance between now and her next visit.

## 2023-07-23 NOTE — Patient Instructions (Addendum)
 It was very nice to see you today!  Please go to [address].    Please start the Augmentin .  Let us  know if not improving in the next week or so.  Return if symptoms worsen or fail to improve.   Take care, Dr Kennyth  PLEASE NOTE:  If you had any lab tests, please let us  know if you have not heard back within a few days. You may see your results on mychart before we have a chance to review them but we will give you a call once they are reviewed by us .   If we ordered any referrals today, please let us  know if you have not heard from their office within the next week.   If you had any urgent prescriptions sent in today, please check with the pharmacy within an hour of our visit to make sure the prescription was transmitted appropriately.   Please try these tips to maintain a healthy lifestyle:  Eat at least 3 REAL meals and 1-2 snacks per day.  Aim for no more than 5 hours between eating.  If you eat breakfast, please do so within one hour of getting up.   Each meal should contain half fruits/vegetables, one quarter protein, and one quarter carbs (no bigger than a computer mouse)  Cut down on sweet beverages. This includes juice, soda, and sweet tea.   Drink at least 1 glass of water with each meal and aim for at least 8 glasses per day  Exercise at least 150 minutes every week.

## 2023-07-26 ENCOUNTER — Ambulatory Visit (INDEPENDENT_AMBULATORY_CARE_PROVIDER_SITE_OTHER)
Admission: RE | Admit: 2023-07-26 | Discharge: 2023-07-26 | Disposition: A | Discharge status: Home / Self Care | Payer: Medicare PPO | Source: Ambulatory Visit | Attending: Family Medicine | Admitting: Family Medicine

## 2023-07-26 DIAGNOSIS — R059 Cough, unspecified: Secondary | ICD-10-CM | POA: Diagnosis not present

## 2023-07-26 DIAGNOSIS — J439 Emphysema, unspecified: Secondary | ICD-10-CM | POA: Diagnosis not present

## 2023-07-26 NOTE — Progress Notes (Signed)
 Her chest x-ray does not show any signs of infection or inflammation.  She should finish her medications and let us know if symptoms are not improving.

## 2023-09-02 ENCOUNTER — Other Ambulatory Visit: Payer: Self-pay | Admitting: Family Medicine

## 2023-09-04 ENCOUNTER — Other Ambulatory Visit: Payer: Self-pay | Admitting: Family Medicine

## 2023-09-09 ENCOUNTER — Ambulatory Visit: Payer: Medicare PPO

## 2023-09-09 ENCOUNTER — Other Ambulatory Visit: Payer: Self-pay | Admitting: Family Medicine

## 2023-09-09 VITALS — Wt 126.0 lb

## 2023-09-09 DIAGNOSIS — F4321 Adjustment disorder with depressed mood: Secondary | ICD-10-CM

## 2023-09-09 DIAGNOSIS — Z0000 Encounter for general adult medical examination without abnormal findings: Secondary | ICD-10-CM | POA: Diagnosis not present

## 2023-09-09 MED ORDER — FLUOXETINE HCL 20 MG PO CAPS: 20.0000 mg | ORAL_CAPSULE | Freq: Every day | ORAL | 3 refills | Status: AC

## 2023-09-09 NOTE — Telephone Encounter (Signed)
 Copied from CRM [id number]. Topic: Clinical - Medication Refill >> Sep 09, 2023 10:24 AM Denese Killings wrote: Most Recent Primary Care Visit:  Provider: Ardith Dark  Department: [facility]  Visit Type: ACUTE  Date: 07/23/2023  Medication: FLUoxetine (PROZAC) 20 MG capsule  Has the patient contacted their pharmacy? Yes (Agent: If no, request that the patient contact the pharmacy for the refill. If patient does not wish to contact the pharmacy document the reason why and proceed with request.) (Agent: If yes, when and what did the pharmacy advise?) advised to call doctor  Is this the correct pharmacy for this prescription? Yes If no, delete pharmacy and type the correct one.  This is the patient's preferred pharmacy:  [facility] [address] [place] - [address] [place] [address] Phone: [phone] Fax: [phone]   Has the prescription been filled recently? No  Is the patient out of the medication? No 2 pills left  Has the patient been seen for an appointment in the last year OR does the patient have an upcoming appointment? Yes  Can we respond through MyChart? Yes  Agent: Please be advised that Rx refills may take up to 3 business days. We ask that you follow-up with your pharmacy.

## 2023-09-09 NOTE — Patient Instructions (Signed)
 Ms. Charlet , Thank you for taking time to come for your Medicare Wellness Visit. I appreciate your ongoing commitment to your health goals. Please review the following plan we discussed and let me know if I can assist you in the future.   Referrals/Orders/Follow-Ups/Clinician Recommendations: maintain health and activity   This is a list of the screening recommended for you and due dates:  Health Maintenance  Topic Date Due   Zoster (Shingles) Vaccine (2 of 2) 04/16/2020   Medicare Annual Wellness Visit  08/21/2023   Screening for Lung Cancer  06/02/2024*   DTaP/Tdap/Td vaccine (5 - Td or Tdap) 09/30/2023   DEXA scan (bone density measurement)  10/28/2023   Cologuard (Stool DNA test)  11/08/2025   Pneumonia Vaccine  Completed   Hepatitis C Screening  Completed   HPV Vaccine  Aged Out   Flu Shot  Discontinued   Colon Cancer Screening  Discontinued   COVID-19 Vaccine  Discontinued  *Topic was postponed. The date shown is not the original due date.    Advanced directives: (Copy Requested) Please bring a copy of your health care power of attorney and living will to the office to be added to your chart at your convenience.  Next Medicare Annual Wellness Visit scheduled for next year: Yes

## 2023-09-09 NOTE — Telephone Encounter (Signed)
 07/23/2023 LOV  11/03/2022 fill date  90/3 refills

## 2023-09-09 NOTE — Progress Notes (Signed)
 Subjective:   [name] is a 78 y.o. who presents for a Medicare Wellness preventive visit.  Visit Complete: Virtual I connected with  Jerene Canny on 09/09/23 by a audio enabled telemedicine application and verified that I am speaking with the correct person using two identifiers.  Patient Location: Home  Provider Location: Office/Clinic  I discussed the limitations of evaluation and management by telemedicine. The patient expressed understanding and agreed to proceed.  Vital Signs: Because this visit was a virtual/telehealth visit, some criteria may be missing or patient reported. Any vitals not documented were not able to be obtained and vitals that have been documented are patient reported.    AWV Questionnaire: No: Patient Medicare AWV questionnaire was not completed prior to this visit.  Cardiac Risk Factors include: advanced age (>36men, >15 women);dyslipidemia     Objective:    Today's Vitals   09/09/23 0921  Weight: 126 lb (57.2 kg)   Body mass index is 23.81 kg/m.     09/09/2023    9:32 AM 08/20/2022   10:17 AM 12/10/2021    2:50 PM 08/05/2021   10:13 AM 03/15/2020    2:03 PM 03/08/2019   10:19 AM 11/19/2017   10:23 AM  Advanced Directives  Does Patient Have a Medical Advance Directive? Yes Yes Yes Yes Yes Yes Yes  Type of Estate agent of [place];Living will Healthcare Power of [place];Living will Healthcare Power of [place];Living will Healthcare Power of [employer] of [place];Living will Living will;Healthcare Power of [employer] Power of [place];Living will  Does patient want to make changes to medical advance directive?       No - Patient declined  Copy of Healthcare Power of Attorney in Chart? No - copy requested No - copy requested No - copy requested No - copy requested   No - copy requested    Current Medications (verified) Outpatient Encounter Medications as of 09/09/2023  Medication Sig    ALPRAZolam (XANAX) 0.5 MG tablet Take 1 tablet by mouth twice daily   ezetimibe (ZETIA) 10 MG tablet Take 1 tablet by mouth once daily (Patient taking differently: Take 10 mg by mouth 2 (two) times a week.)   fenofibrate (TRICOR) 48 MG tablet Take 1 tablet by mouth once daily (Patient taking differently: Take 48 mg by mouth 2 (two) times a week.)   FLUoxetine (PROZAC) 20 MG capsule Take 1 capsule (20 mg total) by mouth daily. Take 60mg  total daily   mirabegron ER (MYRBETRIQ) 50 MG TB24 tablet Take 1 tablet (50 mg total) by mouth daily. (Patient taking differently: Take 50 mg by mouth as needed.)   Multiple Vitamin (MULTI-VITAMIN) tablet Take by mouth.   traMADol (ULTRAM) 50 MG tablet Take by mouth.   traZODone (DESYREL) 50 MG tablet TAKE 1 TABLET BY MOUTH AT BEDTIME   FLUoxetine (PROZAC) 40 MG capsule Take 1 capsule by mouth once daily (Patient not taking: Reported on 09/09/2023)   montelukast (SINGULAIR) 10 MG tablet Take 0.5 tablets (5 mg total) by mouth at bedtime.   [DISCONTINUED] amoxicillin-clavulanate (AUGMENTIN) 875-125 MG tablet Take 1 tablet by mouth 2 (two) times daily.   [DISCONTINUED] metroNIDAZOLE (METROCREAM) 0.75 % cream Apply topically 2 (two) times daily. (Patient not taking: Reported on 07/23/2023)   [DISCONTINUED] oxymetazoline (NASAL RELIEF) 0.05 % nasal spray 1 spray.   [DISCONTINUED] triamcinolone (NASACORT) 55 MCG/ACT AERO nasal inhaler Place 2 sprays into the nose daily.   [DISCONTINUED] triamcinolone cream (KENALOG) 0.1 % Apply 1 application  topically 2 (two) times daily. (Patient not taking: Reported on 07/23/2023)   Facility-Administered Encounter Medications as of 09/09/2023  Medication   denosumab (PROLIA) injection 60 mg   [START ON 11/25/2023] denosumab (PROLIA) injection 60 mg    Allergies (verified) Shellfish-derived products, Lipitor [atorvastatin], Crestor [rosuvastatin calcium], Evolocumab, Hydrocodone-acetaminophen, Metoprolol, Pravastatin, Prednisone, Zetia  [ezetimibe], and Codeine   History: Past Medical History:  Diagnosis Date   Anxiety    Arthritis    "back, neck" (03/08/2017)   Asthma in child    Bowel obstruction (HCC)    CAD (coronary artery disease), native coronary artery    Chronic lower back pain    Chronic neck pain    Closed left tibial fracture    Colon polyps    Complication of anesthesia    "I was awake during one of my surgeries" reports that she felt them start the incision and heard them talking reports happened ~ 35 years ago     Depression    Diverticulosis    Hyperlipidemia    Irritable bowel syndrome    Migraine    "used to be severe; now maybe 1/year or 2" (03/08/2017)   Pneumonia    H!N!   Past Surgical History:  Procedure Laterality Date   ANKLE HARDWARE REMOVAL Left 07/27/2017   Hardware removal left ankle, takedown non-union with allograft bone graft, Intramedullary Nail (Left)   APPENDECTOMY     AUGMENTATION MAMMAPLASTY Bilateral    BRACHIOPLASTY Bilateral    Arm Lift    CARDIAC CATHETERIZATION     CHOLECYSTECTOMY OPEN     COLON SURGERY     EXTERNAL FIXATION LEG Left 03/08/2017   Procedure: INTERNAL FIXATION LEFT ANKLE;  Surgeon: Nadara Mustard, MD;  Location: [facility] OR;  Service: Orthopedics;  Laterality: Left;   EYE MUSCLE SURGERY Bilateral X 2   "lazy eye; OR twice on each eye; Dr. Maple Hudson"   FRACTURE SURGERY     HARDWARE REMOVAL Left 07/27/2017   Procedure: HARDWARE REMOVAL;  Surgeon: Kathryne Hitch, MD;  Location: [facility] OR;  Service: Orthopedics;  Laterality: Left;   LEFT HEART CATH AND CORONARY ANGIOGRAPHY N/A 10/27/2016   Procedure: Left Heart Cath and Coronary Angiography;  Surgeon: Peter M Swaziland, MD;  Location: [facility] INVASIVE CV LAB;  Service: Cardiovascular;  Laterality: N/A;   LEFT HEART CATHETERIZATION WITH CORONARY ANGIOGRAM N/A 08/16/2014   Procedure: LEFT HEART CATHETERIZATION WITH CORONARY ANGIOGRAM;  Surgeon: Lesleigh Noe, MD;  Location: [facility] CATH LAB;  Service: Cardiovascular;   Laterality: N/A;   NASAL SINUS SURGERY     ORIF ANKLE FRACTURE Left 03/08/2017   ORIF ANKLE FRACTURE Left 07/27/2017   Procedure: Hardware removal left ankle, takedown non-union with allograft bone graft, Intramedullary Nail;  Surgeon: Kathryne Hitch, MD;  Location: [facility] OR;  Service: Orthopedics;  Laterality: Left;   PARTIAL COLECTOMY  1970s   TONSILLECTOMY     Family History  Problem Relation Age of Onset   Heart disease Mother        CHF   Coronary artery disease Mother    Hypertension Mother    Hyperlipidemia Mother    Heart disease Father        CHF   Coronary artery disease Father    Diabetes Sister        with all the complications   Coronary artery disease Sister    Stroke Sister 58       CVA   Peripheral vascular disease Sister    Colon  cancer Maternal Grandfather    Healthy Son    Liver disease Son        liver failure    Cancer Neg Hx        breast or colon   Social History   Socioeconomic History   Marital status: Married    Spouse name: Not on file   Number of children: 2   Years of education: Not on file   Highest education level: Bachelor's degree (e.g., BA, AB, BS)  Occupational History   Occupation: Retired Printmaker)     Employer: RETIRED  Tobacco Use   Smoking status: Former    Current packs/day: 0.00    Average packs/day: 1 pack/day for 41.0 years (41.0 ttl pk-yrs)    Types: Cigarettes    Start date: 01/10/1969    Quit date: 01/10/2010    Years since quitting: 13.6   Smokeless tobacco: Never  Vaping Use   Vaping status: Former  Substance and Sexual Activity   Alcohol use: No   Drug use: No   Sexual activity: Not Currently    Partners: Female  Other Topics Concern   Not on file  Social History Narrative   [place]   Married '72-16 yrs/divorce; married '92   1 grandchild   Niece (jamie-Madeline's daughter, with 3 children and full time Consulting civil engineer)   Works as a Engineer, 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   Marriage in good health.       Physician roster   Gyn- Rock Nephew, NP   Opthal- Dr Maple Hudson (pediatric opthal)   GI- dr Tommye Standard- Dr Darrelyn Hillock   ENT- Dr Haroldine Laws            Social Drivers of Health   Financial Resource Strain: Low Risk  (09/09/2023)   Overall Financial Resource Strain (CARDIA)    Difficulty of Paying Living Expenses: Not hard at all  Food Insecurity: No Food Insecurity (09/09/2023)   Hunger Vital Sign    Worried About Running Out of Food in the Last Year: Never true    Ran Out of Food in the Last Year: Never true  Transportation Needs: No Transportation Needs (09/09/2023)   PRAPARE - Transportation    Lack of Transportation (Medical): No    Lack of Transportation (Non-Medical): No  Physical Activity: Insufficiently Active (09/09/2023)   Exercise Vital Sign    Days of Exercise per Week: 3 days    Minutes of Exercise per Session: 30 min  Stress: No Stress Concern Present (09/09/2023)   [employer] of Occupational Health - Occupational Stress Questionnaire    Feeling of Stress : Only a little  Social Connections: Moderately Integrated (09/09/2023)   Social Connection and Isolation Panel [NHANES]    Frequency of Communication with Friends and Family: Twice a week    Frequency of Social Gatherings with Friends and Family: Once a week    Attends Religious Services: 1 to 4 times per year    Active Member of [employer] or Organizations: No    Attends Engineer, structural: Never    Marital Status: Married    Tobacco Counseling Counseling given: Not Answered    Clinical Intake:  Pre-visit preparation completed: Yes  Pain : No/denies pain     BMI - recorded: 23.81 Nutritional Status: BMI of 19-24  Normal Nutritional Risks: None Diabetes: No  How often do you need to have someone help you when you read instructions, pamphlets, or other written materials from your doctor or pharmacy?: 1 - Never  Interpreter Needed?: No  Information entered by :: Lanier Ensign,  LPN   Activities of Daily Living     09/09/2023    9:22 AM  In your present state of health, do you have any difficulty performing the following activities:  Hearing? 0  Vision? 0  Difficulty concentrating or making decisions? 0  Walking or climbing stairs? 0  Dressing or bathing? 0  Doing errands, shopping? 0  Preparing Food and eating ? N  Using the Toilet? N  In the past six months, have you accidently leaked urine? N  Do you have problems with loss of bowel control? N  Managing your Medications? N  Managing your Finances? N  Housekeeping or managing your Housekeeping? N    Patient Care Team: Ardith Dark, MD as PCP - General (Family Medicine) Nahser, Deloris Ping, MD as PCP - Cardiology (Cardiology)  Indicate any recent Medical Services you may have received from other than [facility] providers in the past year (date may be approximate).     Assessment:   This is a routine wellness examination for Chinenye.  Hearing/Vision screen Hearing Screening - Comments:: Pt denies any hearing issues  Vision Screening - Comments:: Pt will follow up with a new provider    Goals Addressed             This Visit's Progress    Patient Stated       Maintain health and activity        Depression Screen     09/09/2023    9:28 AM 07/23/2023   11:07 AM 06/14/2023   10:05 AM 06/14/2023   10:04 AM 08/20/2022   10:14 AM 07/24/2022   10:09 AM 07/02/2022   12:03 PM  PHQ 2/9 Scores  PHQ - 2 Score 1 0 0 6 6 6 6   PHQ- 9 Score 4 0  12 12 22 26     Fall Risk     09/09/2023    9:33 AM 07/23/2023   11:07 AM 06/14/2023   10:05 AM 11/03/2022    2:30 PM 08/20/2022   10:18 AM  Fall Risk   Falls in the past year? 0 0 0 0 0  Number falls in past yr: 0 0 0 0 0  Injury with Fall? 0 0 0 0 0  Risk for fall due to : No Fall Risks No Fall Risks No Fall Risks No Fall Risks No Fall Risks  Follow up Falls prevention discussed;Falls evaluation completed    Falls prevention discussed    MEDICARE RISK AT  HOME:  Medicare Risk at Home Any stairs in or around the home?: Yes If so, are there any without handrails?: No Home free of loose throw rugs in walkways, pet beds, electrical cords, etc?: Yes Adequate lighting in your home to reduce risk of falls?: Yes Life alert?: No Use of a cane, walker or w/c?: No Grab bars in the bathroom?: Yes Shower chair or bench in shower?: No Elevated toilet seat or a handicapped toilet?: No  TIMED UP AND GO:  Was the test performed?  No  Cognitive Function: 6CIT completed        09/09/2023    9:34 AM 08/20/2022   10:19 AM 08/05/2021   10:16 AM  6CIT Screen  What Year? 0 points 0 points 0 points  What month? 0 points 0 points 0 points  What time? 0 points 0 points 0 points  Count back from 20 0 points 0 points 0 points  Months  in reverse 0 points 4 points 0 points  Repeat phrase 0 points 2 points 4 points  Total Score 0 points 6 points 4 points    Immunizations Immunization History  Administered Date(s) Administered   Influenza Split 06/04/2009, 03/23/2014, 04/19/2017   Influenza Whole 04/12/2009, 04/24/2010   Influenza, High Dose Seasonal PF 05/15/2016, 04/19/2017, 04/04/2018, 03/27/2019, 05/27/2020   Influenza-Unspecified 03/13/2014, 05/29/2015, 05/15/2016, 03/11/2017, 04/04/2018, 03/27/2019, 05/11/2021   Moderna Sars-Covid-2 Vaccination 08/18/2019, 09/20/2019   Pneumococcal Conjugate-13 09/29/2013   Pneumococcal Polysaccharide-23 11/23/2007, 06/22/2008, 03/10/2019   Td 08/22/2012   Td (Adult) 08/22/2012   Tdap 09/29/2013   Tetanus 08/22/2012   Zoster Recombinant(Shingrix) 10/24/2019, 02/20/2020   Zoster, Live 12/26/2008, 10/24/2019, 02/20/2020    Screening Tests Health Maintenance  Topic Date Due   Zoster Vaccines- Shingrix (2 of 2) 04/16/2020   Lung Cancer Screening  06/02/2024 (Originally 08/11/2015)   DTaP/Tdap/Td (5 - Td or Tdap) 09/30/2023   DEXA SCAN  10/28/2023   Medicare Annual Wellness (AWV)  09/08/2024   Fecal DNA  (Cologuard)  11/08/2025   Pneumonia Vaccine 33+ Years old  Completed   Hepatitis C Screening  Completed   HPV VACCINES  Aged Out   INFLUENZA VACCINE  Discontinued   Colonoscopy  Discontinued   COVID-19 Vaccine  Discontinued    Health Maintenance  Health Maintenance Due  Topic Date Due   Zoster Vaccines- Shingrix (2 of 2) 04/16/2020   Health Maintenance Items Addressed:    Additional Screening:  Vision Screening: Recommended annual ophthalmology exams for early detection of glaucoma and other disorders of the eye.  Dental Screening: Recommended annual dental exams for proper oral hygiene  Community Resource Referral / Chronic Care Management: CRR required this visit?  No   CCM required this visit?  No     Plan:     I have personally reviewed and noted the following in the patient's chart:   Medical and social history Use of alcohol, tobacco or illicit drugs  Current medications and supplements including opioid prescriptions. Patient is currently taking opioid prescriptions. Information provided to patient regarding non-opioid alternatives. Patient advised to discuss non-opioid treatment plan with their provider. Functional ability and status Nutritional status Physical activity Advanced directives List of other physicians Hospitalizations, surgeries, and ER visits in previous 12 months Vitals Screenings to include cognitive, depression, and falls Referrals and appointments  In addition, I have reviewed and discussed with patient certain preventive protocols, quality metrics, and best practice recommendations. A written personalized care plan for preventive services as well as general preventive health recommendations were provided to patient.     Marzella Schlein, LPN   1/61/0960   After Visit Summary: (MyChart) Due to this being a telephonic visit, the after visit summary with patients personalized plan was offered to patient via MyChart   Notes: Nothing  significant to report at this time.

## 2023-09-15 ENCOUNTER — Ambulatory Visit: Payer: Self-pay | Admitting: Family Medicine

## 2023-09-15 DIAGNOSIS — K5732 Diverticulitis of large intestine without perforation or abscess without bleeding: Secondary | ICD-10-CM | POA: Diagnosis not present

## 2023-09-15 DIAGNOSIS — R1032 Left lower quadrant pain: Secondary | ICD-10-CM | POA: Diagnosis not present

## 2023-09-15 NOTE — Telephone Encounter (Signed)
 Noted patient has OV on 09/16/2023

## 2023-09-15 NOTE — Telephone Encounter (Signed)
 Copied from CRM [id number]. Topic: Clinical - Red Word Triage >> Sep 15, 2023 12:37 PM Gurney Maxin H wrote: [facility] that prompted transfer to Nurse Triage: Micah Flesher to [place] walk in clinic today for pain in side, diagnosed with diverticulitis given antibiotic but pain is getting worse.   Chief Complaint: Abdominal pain  Symptoms: Left lower abdominal pain Frequency: Constant  Pertinent Negatives: Patient denies nausea, vomiting, diarrhea  Disposition: [] ED /[] Urgent Care (no appt availability in office) / [x] Appointment(In office/virtual)/ []  [place] Virtual [facility]/ [] Home Care/ [] Refused Recommended Disposition /[] [place] [facility]/ []  Follow-up with PCP Additional Notes: Patient states that she began to experience left lower abdominal pain 2 days ago that she states is constant and rates as 6/10. She denies any nausea, vomiting, or diarrhea. Patient seen at [facility] walk in clinic and was prescribed an antibiotic for diverticulitis but nothing for pain. Appointment made for tomorrow morning to evaluation and treatment of pain.    Reason for Disposition  [1] MODERATE pain (e.g., interferes with normal activities) AND [2] pain comes and goes (cramps) AND [3] present > 24 hours  (Exception: Pain with Vomiting or Diarrhea - see that Guideline.)  Answer Assessment - Initial Assessment Questions 1. LOCATION: "Where does it hurt?"      Left lower/mid abdomen  2. RADIATION: "Does the pain shoot anywhere else?" (e.g., chest, back)     No 3. ONSET: "When did the pain begin?" (e.g., minutes, hours or days ago)      2 days ago  4. SUDDEN: "Gradual or sudden onset?"     Gradual  5. PATTERN "Does the pain come and go, or is it constant?"    - If it comes and goes: "How long does it last?" "Do you have pain now?"     (Note: Comes and goes means the pain is intermittent. It goes away completely between bouts.)    - If constant: "Is it getting better, staying the same, or getting worse?"      (Note: Constant  means the pain never goes away completely; most serious pain is constant and gets worse.)      Constant  6. SEVERITY: "How bad is the pain?"  (e.g., Scale 1-10; mild, moderate, or severe)    - MILD (1-3): Doesn't interfere with normal activities, abdomen soft and not tender to touch.     - MODERATE (4-7): Interferes with normal activities or awakens from sleep, abdomen tender to touch.     - SEVERE (8-10): Excruciating pain, doubled over, unable to do any normal activities.       6/10 7. RECURRENT SYMPTOM: "Have you ever had this type of stomach pain before?" If Yes, ask: "When was the last time?" and "What happened that time?"      Yes, history of diverticulitis  8. CAUSE: "What do you think is causing the stomach pain?"     Unsure  9. RELIEVING/AGGRAVATING FACTORS: "What makes it better or worse?" (e.g., antacids, bending or twisting motion, bowel movement)     Moving worsens pain  10. OTHER SYMPTOMS: "Do you have any other symptoms?" (e.g., back pain, diarrhea, fever, urination pain, vomiting)       No  Protocols used: Abdominal Pain - Female-A-AH

## 2023-09-16 ENCOUNTER — Other Ambulatory Visit: Payer: Self-pay

## 2023-09-16 ENCOUNTER — Ambulatory Visit: Admitting: Physician Assistant

## 2023-09-16 ENCOUNTER — Emergency Department (HOSPITAL_BASED_OUTPATIENT_CLINIC_OR_DEPARTMENT_OTHER)
Admission: EM | Admit: 2023-09-16 | Discharge: 2023-09-16 | Disposition: A | Discharge status: Home / Self Care | Attending: Emergency Medicine | Admitting: Emergency Medicine

## 2023-09-16 ENCOUNTER — Emergency Department (HOSPITAL_BASED_OUTPATIENT_CLINIC_OR_DEPARTMENT_OTHER)

## 2023-09-16 ENCOUNTER — Encounter (HOSPITAL_BASED_OUTPATIENT_CLINIC_OR_DEPARTMENT_OTHER): Payer: Self-pay | Admitting: Emergency Medicine

## 2023-09-16 DIAGNOSIS — K5792 Diverticulitis of intestine, part unspecified, without perforation or abscess without bleeding: Secondary | ICD-10-CM

## 2023-09-16 DIAGNOSIS — I2510 Atherosclerotic heart disease of native coronary artery without angina pectoris: Secondary | ICD-10-CM | POA: Diagnosis not present

## 2023-09-16 DIAGNOSIS — K5732 Diverticulitis of large intestine without perforation or abscess without bleeding: Secondary | ICD-10-CM | POA: Diagnosis not present

## 2023-09-16 DIAGNOSIS — K5730 Diverticulosis of large intestine without perforation or abscess without bleeding: Secondary | ICD-10-CM | POA: Diagnosis not present

## 2023-09-16 DIAGNOSIS — N281 Cyst of kidney, acquired: Secondary | ICD-10-CM | POA: Diagnosis not present

## 2023-09-16 DIAGNOSIS — R1032 Left lower quadrant pain: Secondary | ICD-10-CM | POA: Diagnosis not present

## 2023-09-16 LAB — COMPREHENSIVE METABOLIC PANEL
ALT: 14 U/L (ref 0–44)
AST: 17 U/L (ref 15–41)
Albumin: 4.4 g/dL (ref 3.5–5.0)
Alkaline Phosphatase: 19 U/L — ABNORMAL LOW (ref 38–126)
Anion gap: 10 (ref 5–15)
BUN: 22 mg/dL (ref 8–23)
CO2: 27 mmol/L (ref 22–32)
Calcium: 9.2 mg/dL (ref 8.9–10.3)
Chloride: 103 mmol/L (ref 98–111)
Creatinine, Ser: 1.06 mg/dL — ABNORMAL HIGH (ref 0.44–1.00)
GFR, Estimated: 55 mL/min — ABNORMAL LOW (ref >60)
Glucose, Bld: 96 mg/dL (ref 70–99)
Potassium: 4 mmol/L (ref 3.5–5.1)
Sodium: 140 mmol/L (ref 135–145)
Total Bilirubin: 0.7 mg/dL (ref 0.0–1.2)
Total Protein: 6.9 g/dL (ref 6.5–8.1)

## 2023-09-16 LAB — CBC WITH DIFFERENTIAL/PLATELET
Abs Immature Granulocytes: 0.02 10*3/uL (ref 0.00–0.07)
Basophils Absolute: 0 10*3/uL (ref 0.0–0.1)
Basophils Relative: 1 %
Eosinophils Absolute: 0.7 10*3/uL — ABNORMAL HIGH (ref 0.0–0.5)
Eosinophils Relative: 12 %
HCT: 40.7 % (ref 36.0–46.0)
Hemoglobin: 13.6 g/dL (ref 12.0–15.0)
Immature Granulocytes: 0 %
Lymphocytes Relative: 22 %
Lymphs Abs: 1.2 10*3/uL (ref 0.7–4.0)
MCH: 30 pg (ref 26.0–34.0)
MCHC: 33.4 g/dL (ref 30.0–36.0)
MCV: 89.8 fL (ref 80.0–100.0)
Monocytes Absolute: 0.6 10*3/uL (ref 0.1–1.0)
Monocytes Relative: 10 %
Neutro Abs: 3.1 10*3/uL (ref 1.7–7.7)
Neutrophils Relative %: 55 %
Platelets: 237 10*3/uL (ref 150–400)
RBC: 4.53 MIL/uL (ref 3.87–5.11)
RDW: 12.3 % (ref 11.5–15.5)
WBC: 5.5 10*3/uL (ref 4.0–10.5)
nRBC: 0 % (ref 0.0–0.2)

## 2023-09-16 LAB — LIPASE, BLOOD: Lipase: 12 U/L (ref 11–51)

## 2023-09-16 MED ORDER — PROMETHAZINE HCL 25 MG/ML IJ SOLN
INTRAMUSCULAR | Status: AC
  Administered 2023-09-16: 25 mg
  Filled 2023-09-16: qty 1

## 2023-09-16 MED ORDER — HYDROMORPHONE HCL 1 MG/ML IJ SOLN
0.5000 mg | Freq: Once | INTRAMUSCULAR | Status: AC
  Administered 2023-09-16: 0.5 mg via INTRAVENOUS
  Filled 2023-09-16: qty 1

## 2023-09-16 MED ORDER — IOHEXOL 300 MG/ML  SOLN
100.0000 mL | Freq: Once | INTRAMUSCULAR | Status: AC | PRN
  Administered 2023-09-16: 75 mL via INTRAVENOUS

## 2023-09-16 MED ORDER — METAMUCIL SMOOTH TEXTURE 58.6 % PO POWD
1.0000 | Freq: Three times a day (TID) | ORAL | 12 refills | Status: AC
Start: 2023-09-16 — End: ?

## 2023-09-16 MED ORDER — SODIUM CHLORIDE 0.9 % IV SOLN
12.5000 mg | Freq: Once | INTRAVENOUS | Status: AC
  Administered 2023-09-16: 12.5 mg via INTRAVENOUS
  Filled 2023-09-16: qty 0.5

## 2023-09-16 MED ORDER — SODIUM CHLORIDE 0.9 % IV BOLUS
1000.0000 mL | Freq: Once | INTRAVENOUS | Status: AC
  Administered 2023-09-16: 1000 mL via INTRAVENOUS

## 2023-09-16 MED ORDER — OXYCODONE HCL 5 MG PO TABS: 5.0000 mg | ORAL_TABLET | Freq: Four times a day (QID) | ORAL | 0 refills | Status: DC | PRN

## 2023-09-16 MED ORDER — SENNOSIDES-DOCUSATE SODIUM 8.6-50 MG PO TABS: 1.0000 | ORAL_TABLET | Freq: Every day | ORAL | 0 refills | Status: AC

## 2023-09-16 MED ORDER — MORPHINE SULFATE (PF) 4 MG/ML IV SOLN
4.0000 mg | Freq: Once | INTRAVENOUS | Status: AC
  Administered 2023-09-16: 4 mg via INTRAVENOUS
  Filled 2023-09-16: qty 1

## 2023-09-16 MED ORDER — ONDANSETRON HCL 4 MG/2ML IJ SOLN
4.0000 mg | Freq: Once | INTRAMUSCULAR | Status: AC
  Administered 2023-09-16: 4 mg via INTRAVENOUS
  Filled 2023-09-16: qty 2

## 2023-09-16 NOTE — ED Notes (Signed)

## 2023-09-16 NOTE — ED Provider Notes (Signed)
 Patient signed out to me at 1530 by Dr. Particia Nearing pending CT read.  In short this is a 78 year old female with a past medical history of diverticulitis, bowel obstructions, IBS, CAD who presented to the emergency department with left lower quadrant abdominal pain.  She was seen at urgent care yesterday and was started on antibiotics for diverticulitis but returned today for increased pain.  She is hemodynamically stable on arrival here in no acute distress.  Had labs performed that are within normal range.  CT read is pending at this time.  Clinical Course as of 09/16/23 1713  Thu Sep 16, 2023  1659 CTAP with biliary ductal dilation, patient has normal LFTs making biliary obstruction less likely, likely related to post cholecystectomy changes. Diverticulosis seen without diverticulitis or complication. Patient will be recommended to complete her antibiotics as prescribed and is stable for discharge home with outpatient follow up. [VK]    Clinical Course User Index [VK] Rexford Maus, DO      Rexford Maus, [place] 09/16/23 [phone]

## 2023-09-16 NOTE — ED Triage Notes (Signed)
 Pt bib spouse, c/o LLQ Abd pain x 3 days, dx with diverticulitis. Tx at [facility] yesterday for same, prescribed abx. Endorses constipation x 6 days, denies n/v. Pt requesting CT

## 2023-09-16 NOTE — ED Provider Notes (Signed)
 [place] EMERGENCY DEPARTMENT AT [facility] Provider Note   CSN: [id number] Arrival date & time: 09/16/23  1049     History  Chief Complaint  Patient presents with   Abdominal Pain    [name] is a 78 y.o. female.  Pt is a 78 yo female with pmhx significant for diverticulitis, IBS, hld, cad, migraines, arthritis, and hx bowel obstruction.  Pt said she has been having LLQ pain for the past few days.  She did go to [facility] yesterday and was dx's with clinical diverticulitis.  She was d/c with Augmentin, but nothing for pain.  Pt has been in a lot of pain.  She has also been constipated for 5 days.         Home Medications Prior to Admission medications   Medication Sig Start Date End Date Taking? Authorizing Provider  oxyCODONE (ROXICODONE) 5 MG immediate release tablet Take 1 tablet (5 mg total) by mouth every 6 (six) hours as needed. 09/16/23  Yes Theresia Lo, Victoria K, DO  psyllium (METAMUCIL SMOOTH TEXTURE) 58.6 % powder Take 1 packet by mouth 3 (three) times daily. 09/16/23  Yes Theresia Lo, Turkey K, DO  senna-docusate (SENOKOT-S) 8.6-50 MG tablet Take 1 tablet by mouth daily. 09/16/23  Yes Theresia Lo, Benetta Spar K, DO  ALPRAZolam Prudy Feeler) 0.5 MG tablet Take 1 tablet by mouth twice daily 09/06/23   Ardith Dark, MD  ezetimibe (ZETIA) 10 MG tablet Take 1 tablet by mouth once daily Patient taking differently: Take 10 mg by mouth 2 (two) times a week. 05/12/23   Ardith Dark, MD  fenofibrate (TRICOR) 48 MG tablet Take 1 tablet by mouth once daily Patient taking differently: Take 48 mg by mouth 2 (two) times a week. 09/02/23   Ardith Dark, MD  FLUoxetine (PROZAC) 20 MG capsule Take 1 capsule (20 mg total) by mouth daily. Take 60mg  total daily 09/09/23   Ardith Dark, MD  FLUoxetine (PROZAC) 40 MG capsule Take 1 capsule by mouth once daily Patient not taking: Reported on 09/09/2023 12/28/22   Ardith Dark, MD  mirabegron ER (MYRBETRIQ) 50 MG TB24 tablet Take 1  tablet (50 mg total) by mouth daily. Patient taking differently: Take 50 mg by mouth as needed. 11/03/22   Ardith Dark, MD  montelukast (SINGULAIR) 10 MG tablet Take 0.5 tablets (5 mg total) by mouth at bedtime. 06/03/23 07/03/23  Allwardt, Crist Infante, PA-C  Multiple Vitamin (MULTI-VITAMIN) tablet Take by mouth. 04/13/17   [provider]  traMADol (ULTRAM) 50 MG tablet Take by mouth. 10/22/21   [provider]  traZODone (DESYREL) 50 MG tablet TAKE 1 TABLET BY MOUTH AT BEDTIME 05/28/23   Ardith Dark, MD      Allergies    Shellfish-derived products, Lipitor [atorvastatin], Crestor [rosuvastatin calcium], Evolocumab, Hydrocodone-acetaminophen, Metoprolol, Pravastatin, Prednisone, Zetia [ezetimibe], and Codeine    Review of Systems   Review of Systems  Gastrointestinal:  Positive for abdominal pain and constipation.  All other systems reviewed and are negative.   Physical Exam Updated Vital Signs BP 102/77   Pulse 64   Temp 97.6 F (36.4 C)   Resp 16   SpO2 93%  Physical Exam Vitals and nursing note reviewed.  Constitutional:      Appearance: She is well-developed.  HENT:     Head: Normocephalic and atraumatic.     Mouth/Throat:     Mouth: Mucous membranes are moist.     Pharynx: Oropharynx is clear.  Eyes:  Extraocular Movements: Extraocular movements intact.     Pupils: Pupils are equal, round, and reactive to light.  Cardiovascular:     Rate and Rhythm: Normal rate and regular rhythm.     Heart sounds: Normal heart sounds.  Pulmonary:     Effort: Pulmonary effort is normal.     Breath sounds: Normal breath sounds.  Abdominal:     General: Abdomen is flat. Bowel sounds are normal.     Palpations: Abdomen is soft.     Tenderness: There is abdominal tenderness in the left lower quadrant.  Skin:    General: Skin is warm.     Capillary Refill: Capillary refill takes less than 2 seconds.  Neurological:     General: No focal deficit present.      Mental Status: She is alert and oriented to person, place, and time.  Psychiatric:        Mood and Affect: Mood normal.        Behavior: Behavior normal.     ED Results / Procedures / Treatments   Labs (all labs ordered are listed, but only abnormal results are displayed) Labs Reviewed  CBC WITH DIFFERENTIAL/PLATELET - Abnormal; Notable for the following components:      Result Value   Eosinophils Absolute 0.7 (*)    All other components within normal limits  COMPREHENSIVE METABOLIC PANEL - Abnormal; Notable for the following components:   Creatinine, Ser 1.06 (*)    Alkaline Phosphatase 19 (*)    GFR, Estimated 55 (*)    All other components within normal limits  LIPASE, BLOOD    EKG None  Radiology CT ABDOMEN PELVIS W CONTRAST Result Date: 09/16/2023 CLINICAL DATA:  Acute left lower quadrant abdominal pain for 3 days. EXAM: CT ABDOMEN AND PELVIS WITH CONTRAST TECHNIQUE: Multidetector CT imaging of the abdomen and pelvis was performed using the standard protocol following bolus administration of intravenous contrast. RADIATION DOSE REDUCTION: This exam was performed according to the departmental dose-optimization program which includes automated exposure control, adjustment of the mA and/or kV according to patient size and/or use of iterative reconstruction technique. CONTRAST:  75mL OMNIPAQUE IOHEXOL 300 MG/ML  SOLN COMPARISON:  May 13, 2007. FINDINGS: Lower chest: No acute abnormality. Hepatobiliary: Status post cholecystectomy. No hepatic abnormality. Moderate intrahepatic and extrahepatic biliary dilatation is noted without definite calculus. Pancreas: Unremarkable. No pancreatic ductal dilatation or surrounding inflammatory changes. Spleen: Normal in size without focal abnormality. Adrenals/Urinary Tract: Adrenal glands appear normal. Right renal cyst is noted. No hydronephrosis or renal obstruction is noted. Urinary bladder is unremarkable. Stomach/Bowel: Stomach is  unremarkable. Status post appendectomy. There is no evidence of bowel obstruction or inflammation. Diverticulosis of descending and sigmoid colon is noted without inflammation. Vascular/Lymphatic: Aortic atherosclerosis. No enlarged abdominal or pelvic lymph nodes. Reproductive: Uterus and bilateral adnexa are unremarkable. Other: No ascites or hernia is noted. Musculoskeletal: No acute or significant osseous findings. IMPRESSION: Moderate intrahepatic and extrahepatic biliary dilatation is noted which may be due to post cholecystectomy status, but obstruction cannot be excluded and correlation with liver function tests is recommended. MRCP may be performed for further evaluation is well. Diverticulosis of descending and sigmoid colon without inflammation. Aortic Atherosclerosis (ICD10-I70.0). Electronically Signed   By: Lupita Raider M.D.   On: 09/16/2023 16:47    Procedures Procedures    Medications Ordered in ED Medications  sodium chloride 0.9 % bolus 1,000 mL ( Intravenous Stopped 09/16/23 1543)  morphine (PF) 4 MG/ML injection 4 mg (4 mg Intravenous Given  09/16/23 1338)  ondansetron (ZOFRAN) injection 4 mg (4 mg Intravenous Given 09/16/23 1334)  iohexol (OMNIPAQUE) 300 MG/ML solution 100 mL (75 mLs Intravenous Contrast Given 09/16/23 1408)  HYDROmorphone (DILAUDID) injection 0.5 mg (0.5 mg Intravenous Given 09/16/23 1505)  promethazine (PHENERGAN) 12.5 mg in sodium chloride 0.9 % 50 mL IVPB (0 mg Intravenous Stopped 09/16/23 1532)  promethazine (PHENERGAN) 25 MG/ML injection (25 mg  Given 09/16/23 1509)    ED Course/ Medical Decision Making/ A&P Clinical Course as of 09/18/23 0706  Thu Sep 16, 2023  1659 CTAP with biliary ductal dilation, patient has normal LFTs making biliary obstruction less likely, likely related to post cholecystectomy changes. Diverticulosis seen without diverticulitis or complication. Patient will be recommended to complete her antibiotics as prescribed and is stable for  discharge home with outpatient follow up. [VK]    Clinical Course User Index [VK] Rexford Maus, DO                                 Medical Decision Making Amount and/or Complexity of Data Reviewed Labs: ordered. Radiology: ordered.  Risk OTC drugs. Prescription drug management.   This patient presents to the ED for concern of abd pain, this involves an extensive number of treatment options, and is a complaint that carries with it a high risk of complications and morbidity.  The differential diagnosis includes diverticulitis, uti, sbo   Co morbidities that complicate the patient evaluation  diverticulitis, IBS, hld, cad, migraines, arthritis, and hx bowel obstruction   Additional history obtained:  Additional history obtained from epic chart review External records from outside source obtained and reviewed including husband   Lab Tests:  I Ordered, and personally interpreted labs.  The pertinent results include:  cbc nl, cmp nl, lip nl   Imaging Studies ordered:  I ordered imaging studies including ct abd/pelvis  Pending at shift change   Cardiac Monitoring:  The patient was maintained on a cardiac monitor.  I personally viewed and interpreted the cardiac monitored which showed an underlying rhythm of: nsr   Medicines ordered and prescription drug management:  I ordered medication including ivfs/morphine/zofran  for sx  Reevaluation of the patient after these medicines showed that the patient improved I have reviewed the patients home medicines and have made adjustments as needed   Test Considered:  ct   Critical Interventions:  Pain control   Problem List / ED Course:  Abd pain:  CT pending at shift change   Reevaluation:  After the interventions noted above, I reevaluated the patient and found that they have :improved   Social Determinants of Health:  Lives at home   Dispostion:  Pending at shift change        Final  Clinical Impression(s) / ED Diagnoses Final diagnoses:  Diverticulitis    Rx / DC Orders ED Discharge Orders          Ordered    oxyCODONE (ROXICODONE) 5 MG immediate release tablet  Every 6 hours PRN        09/16/23 1715    senna-docusate (SENOKOT-S) 8.6-50 MG tablet  Daily        09/16/23 1715    psyllium (METAMUCIL SMOOTH TEXTURE) 58.6 % powder  3 times daily        09/16/23 1715              Jacalyn Lefevre, MD 09/18/23 [phone]

## 2023-09-16 NOTE — Discharge Instructions (Signed)
 You were seen in the emergency department for your abdominal pain.  Your blood work here was normal and your CAT scan did not show any active diverticulitis.  I still suspect that you likely had a flare of your diverticulitis and are already having some improvement with the antibiotics you have been taking.  You should complete your antibiotic prescription as prescribed.  You can take Tylenol every 6 hours as needed for pain and I have given you oxycodone for breakthrough pain.  This can make you drowsy so do not take it while driving, working or operating heavy machinery.  It can also worsen constipation so I have given you a stool softener to take while you are on the pain medication as well as a fiber supplement that you can continue to take daily to help prevent recurrent of diverticulitis.  You should return to the emergency department if you are having significantly worsening pain, fevers despite the antibiotics or any other new or concerning symptoms.

## 2023-09-17 ENCOUNTER — Telehealth: Payer: Self-pay

## 2023-09-17 NOTE — Transitions of Care (Post Inpatient/ED Visit) (Signed)
 09/17/2023  Name: [name] MRN: [id number] DOB: 03-Aug-1945  Today's TOC FU Call Status: Today's TOC FU Call Status:: Successful TOC FU Call Completed TOC FU Call Complete Date: 09/17/23 Patient's Name and Date of Birth confirmed.  Transition Care Management Follow-up Telephone Call Date of Discharge: 09/16/23 Discharge Facility: [facility] ([facility]) Type of Discharge: Emergency Department Reason for ED Visit: Other: (diverticulitis) How have you been since you were released from the hospital?: Better Any questions or concerns?: Yes Patient Questions/Concerns:: patient wants referral to GI, declined TOC appt  Items Reviewed: Did you receive and understand the discharge instructions provided?: Yes Medications obtained,verified, and reconciled?: Yes (Medications Reviewed) Any new allergies since your discharge?: No Dietary orders reviewed?: Yes Do you have support at home?: Yes People in Home: spouse  Medications Reviewed Today: Medications Reviewed Today     Reviewed by Karena Addison, LPN (Licensed Practical Nurse) on 09/17/23 at 1113  Med List Status: <None>   Medication Order Taking? Sig Documenting Provider Last Dose Status Informant  ALPRAZolam (XANAX) 0.5 MG tablet [id number] No Take 1 tablet by mouth twice daily Ardith Dark, MD Taking Active   denosumab [facility]) injection 60 mg [id number]   Carlus Pavlov, MD  Active   denosumab [facility]) injection 60 mg [id number]   Carlus Pavlov, MD  Active   ezetimibe (ZETIA) 10 MG tablet [id number] No Take 1 tablet by mouth once daily  Patient taking differently: Take 10 mg by mouth 2 (two) times a week.   Ardith Dark, MD Taking Active   fenofibrate (TRICOR) 48 MG tablet [id number] No Take 1 tablet by mouth once daily  Patient taking differently: Take 48 mg by mouth 2 (two) times a week.   Ardith Dark, MD Taking Active   FLUoxetine (PROZAC) 20 MG capsule [id number]  Take 1 capsule (20 mg total) by mouth  daily. Take 60mg  total daily Ardith Dark, MD  Active   FLUoxetine (PROZAC) 40 MG capsule [id number] No Take 1 capsule by mouth once daily  Patient not taking: Reported on 09/09/2023   Ardith Dark, MD Not Taking Active   mirabegron ER (MYRBETRIQ) 50 MG TB24 tablet [id number] No Take 1 tablet (50 mg total) by mouth daily.  Patient taking differently: Take 50 mg by mouth as needed.   Ardith Dark, MD Taking Active   montelukast (SINGULAIR) 10 MG tablet [id number] No Take 0.5 tablets (5 mg total) by mouth at bedtime. Allwardt, Crist Infante, PA-C Taking Expired 07/03/23 2359   Multiple Vitamin (MULTI-VITAMIN) tablet [id number] No Take by mouth. [provider] Taking Active   oxyCODONE (ROXICODONE) 5 MG immediate release tablet [id number]  Take 1 tablet (5 mg total) by mouth every 6 (six) hours as needed. Elayne Snare K, DO  Active   psyllium (METAMUCIL SMOOTH TEXTURE) 58.6 % powder [id number]  Take 1 packet by mouth 3 (three) times daily. Elayne Snare K, DO  Active   senna-docusate (SENOKOT-S) 8.6-50 MG tablet [id number]  Take 1 tablet by mouth daily. Elayne Snare K, DO  Active   traMADol (ULTRAM) 50 MG tablet [id number] No Take by mouth. [provider] Taking Active   traZODone (DESYREL) 50 MG tablet [id number] No TAKE 1 TABLET BY MOUTH AT BEDTIME Ardith Dark, MD Taking Active             Home Care and Equipment/Supplies: Were Home Health Services Ordered?: NA Any new equipment or medical supplies ordered?: NA  Functional Questionnaire: Do you need assistance with  bathing/showering or dressing?: No Do you need assistance with meal preparation?: No Do you need assistance with eating?: No Do you have difficulty maintaining continence: No Do you need assistance with getting out of bed/getting out of a chair/moving?: No Do you have difficulty managing or taking your medications?: No  Follow up appointments reviewed: PCP Follow-up appointment  confirmed?: No (declined appt) MD Provider Line Number:[phone] Given: No Specialist Hospital Follow-up appointment confirmed?: NA Do you need transportation to your follow-up appointment?: No Do you understand care options if your condition(s) worsen?: Yes-patient verbalized understanding    SIGNATURE Karena Addison, LPN [facility] Nurse Health Advisor Direct Dial [phone]

## 2023-09-24 ENCOUNTER — Inpatient Hospital Stay: Admitting: Family Medicine

## 2023-10-13 ENCOUNTER — Other Ambulatory Visit: Payer: Self-pay | Admitting: Family Medicine

## 2023-10-20 ENCOUNTER — Other Ambulatory Visit: Payer: Self-pay | Admitting: Family Medicine

## 2023-10-21 DIAGNOSIS — K5732 Diverticulitis of large intestine without perforation or abscess without bleeding: Secondary | ICD-10-CM | POA: Diagnosis not present

## 2023-10-21 DIAGNOSIS — R1032 Left lower quadrant pain: Secondary | ICD-10-CM | POA: Diagnosis not present

## 2023-10-22 ENCOUNTER — Encounter: Payer: Self-pay | Admitting: Family Medicine

## 2023-10-22 ENCOUNTER — Ambulatory Visit: Admitting: Family Medicine

## 2023-10-22 VITALS — BP 118/75 | HR 84 | Temp 97.0°F | Ht 61.0 in | Wt 127.0 lb

## 2023-10-22 DIAGNOSIS — K5790 Diverticulosis of intestine, part unspecified, without perforation or abscess without bleeding: Secondary | ICD-10-CM

## 2023-10-22 DIAGNOSIS — K5900 Constipation, unspecified: Secondary | ICD-10-CM

## 2023-10-22 MED ORDER — TRAMADOL HCL 50 MG PO TABS: 50.0000 mg | ORAL_TABLET | Freq: Three times a day (TID) | ORAL | 0 refills | Status: AC | PRN

## 2023-10-22 NOTE — Progress Notes (Signed)
 [name] is a 78 y.o. female who presents today for an office visit.  Assessment/Plan:  New/Acute Problems: Abdominal Pain  Consistent with diverticulitis flare. No red flag findings.  Mild tenderness on exam otherwise no signs of peritonitis.  She has already started on Augmentin yesterday.  Encouraged her to finish this to completion.  Will also treating her constipation as below.  Given her recurrence over the last couple of months would be reasonable for her to see GI at this point.  Will place referral.  She still having quite a bit of pain and she did ask for options to help manage this.  Recommended against oxycodone due to her constipation as below.  Will give small supply of tramadol.  She has done well with this in the past.  She is aware of potential side effects.    We also did discuss recent incidental finding of dilated biliary duct on CT scan in the ED.  Her LFTs were normal in the ED and symptoms do not match for retained stone however we will defer further workup to gastroenterology.  We discussed reasons to return to care and seek emergent care.  Chronic Problems Addressed Today: Diverticulosis She has had 2 flareups of diverticulitis within the last month or so.  As above she will continue her Augmentin and will refer to GI for further evaluation and management.  Constipation Likely triggering to her above diverticulitis issues.  She is already taking high-fiber diet.  Recommended daily MiraLAX with goal having 1-2 soft bowel movements daily.  Will defer further management to GI.     Subjective:  HPI:  See A/P for status of chronic conditions.  Patient is here today for follow-up.  She was in the ED about a month ago with left lower quadrant pain.  Had CT scan at that time which showed diverticulosis without diverticulitis.  She was started on antibiotics.  The remainder of her workup at that time including labs were negative.  CT scan did show biliary ductal  dilation which was thought to be related to being status post cholecystectomy. She did have a reaction after her CT scan including flushing, dry heaves, and shakiness. This resolved after she was given dilaudid and phenergan about 45 minutes later.   Over the last several weeks pain improved however over the last couple of days she has had worsening pain in her LLQ and she is worried about recurrence. No fevers or chills. No nausea or vomiting. No diarrhea. No melena or hematochezia.  She went to the walk in clinic yesterday and was started on Augmentin. She has not noticed much of a difference as of yet. She does have a history of diverticulitis with peritonitis several years ago.  She has had ongoing issues with constipation for several years.  Usually has a bowel movement every 3 to 4 days though can sometimes have 5 days in between bowel movements.  Usually uses MiraLAX as needed which does work well.       Objective:  Physical Exam: BP 118/75   Pulse 84   Temp (!) 97 F (36.1 C) (Temporal)   Ht 5\' 1"  (1.549 m)   Wt 127 lb (57.6 kg)   SpO2 100%   BMI 24.00 kg/m   Gen: No acute distress, resting comfortably CV: Regular rate and rhythm with no murmurs appreciated Pulm: Normal work of breathing, clear to auscultation bilaterally with no crackles, wheezes, or rhonchi Abdomen: No deformities.  Bowel sounds present.  Soft.  Tenderness palpation to left lower quadrant.  No distention.  No rebound or guarding. Neuro: Grossly normal, moves all extremities Psych: Normal affect and thought content  Time Spent: 45 minutes of total time was spent on the date of the encounter performing the following actions: chart review prior to seeing the patient including recent Emergency Department visit, obtaining history, performing a medically necessary exam, counseling on the treatment plan, placing orders, and documenting in our EHR.        Katina Degree. Jimmey Ralph, MD 10/22/2023 11:10 AM

## 2023-10-22 NOTE — Assessment & Plan Note (Signed)
 Likely triggering to her above diverticulitis issues.  She is already taking high-fiber diet.  Recommended daily MiraLAX with goal having 1-2 soft bowel movements daily.  Will defer further management to GI.

## 2023-10-22 NOTE — Patient Instructions (Signed)
 It was very nice to see you today!  Please continue the Augmentin.  Please take MiraLAX daily.  You should try to have 1-2 soft bowel movements daily.  We will refer you to see gastroenterology.  Return if symptoms worsen or fail to improve.   Take care, Dr Jimmey Ralph  PLEASE NOTE:  If you had any lab tests, please let us know if you have not heard back within a few days. You may see your results on mychart before we have a chance to review them but we will give you a call once they are reviewed by [place].   If we ordered any referrals today, please let us know if you have not heard from their office within the next week.   If you had any urgent prescriptions sent in today, please check with the pharmacy within an hour of our visit to make sure the prescription was transmitted appropriately.   Please try these tips to maintain a healthy lifestyle:  Eat at least 3 REAL meals and 1-2 snacks per day.  Aim for no more than 5 hours between eating.  If you eat breakfast, please do so within one hour of getting up.   Each meal should contain half fruits/vegetables, one quarter protein, and one quarter carbs (no bigger than a computer mouse)  Cut down on sweet beverages. This includes juice, soda, and sweet tea.   Drink at least 1 glass of water with each meal and aim for at least 8 glasses per day  Exercise at least 150 minutes every week.

## 2023-10-22 NOTE — Assessment & Plan Note (Signed)
 She has had 2 flareups of diverticulitis within the last month or so.  As above she will continue her Augmentin and will refer to GI for further evaluation and management.

## 2023-10-27 NOTE — Telephone Encounter (Signed)
 Prolia VOB initiated via [email or web address]  Next Prolia inj DUE: 11/26/23

## 2023-11-05 IMAGING — DX DG LUMBAR SPINE COMPLETE 4+V
5 series · 5 of 5 positions shown · non-contrast
Comparison: X-ray 02/12/2020; MRI 03/27/2020.

CLINICAL DATA: Chronic lower back, bilateral hip pain.

EXAM:
LUMBAR SPINE - COMPLETE 4+ VIEW

[l-spine ap]
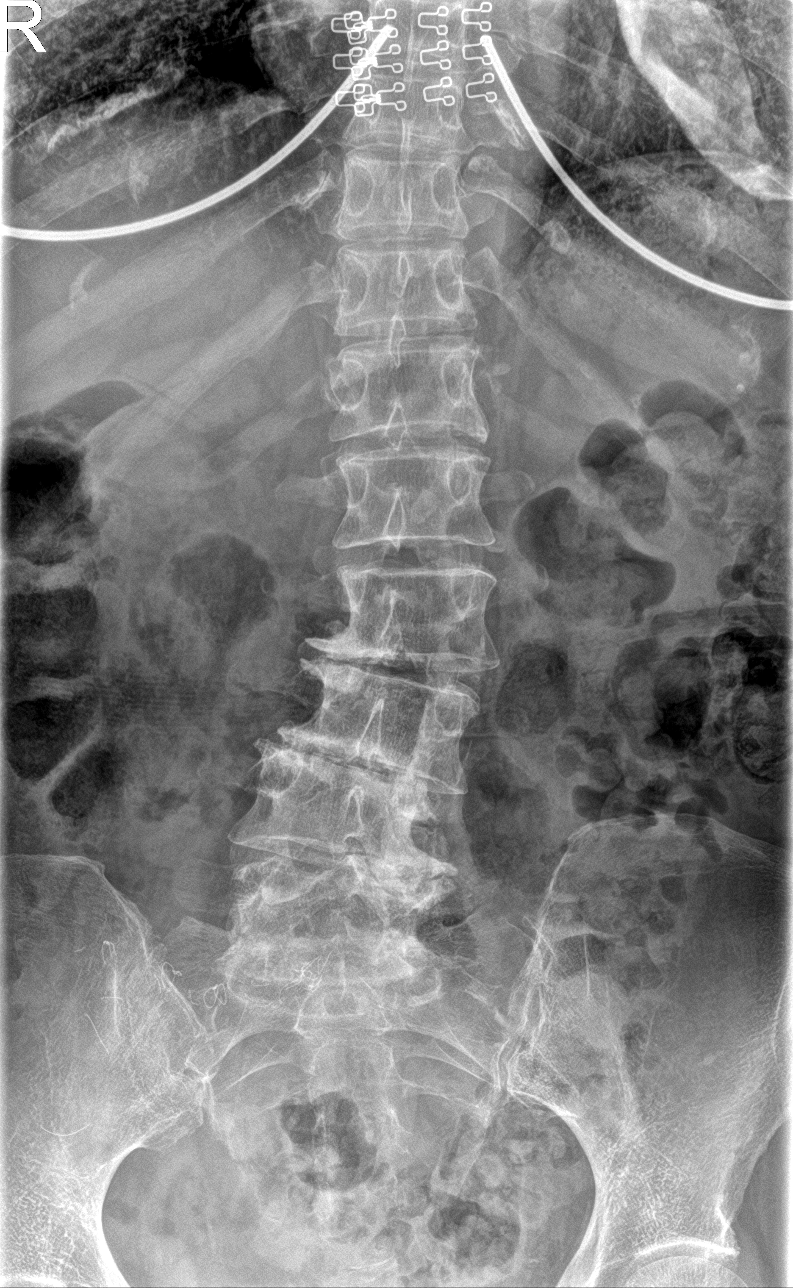

[l-spine obl (1 of 2)]
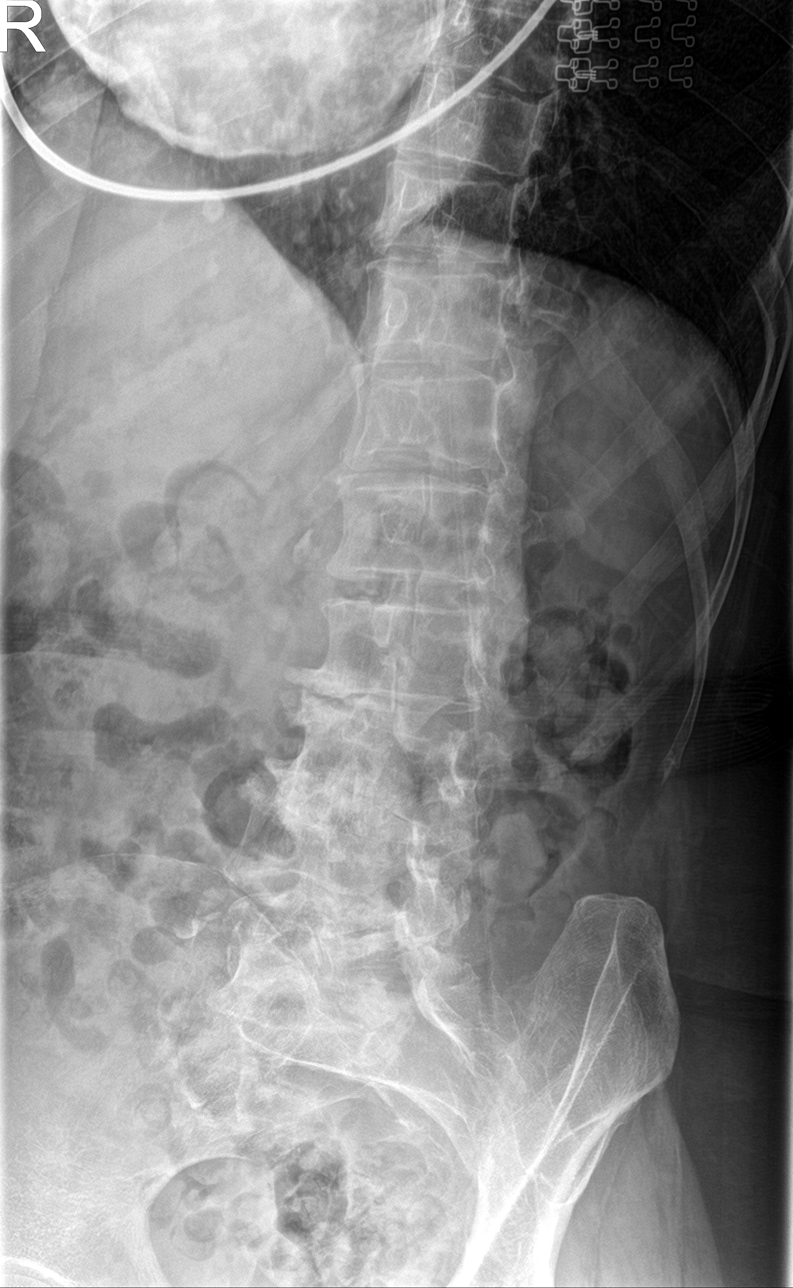

[l-spine obl (2 of 2)]
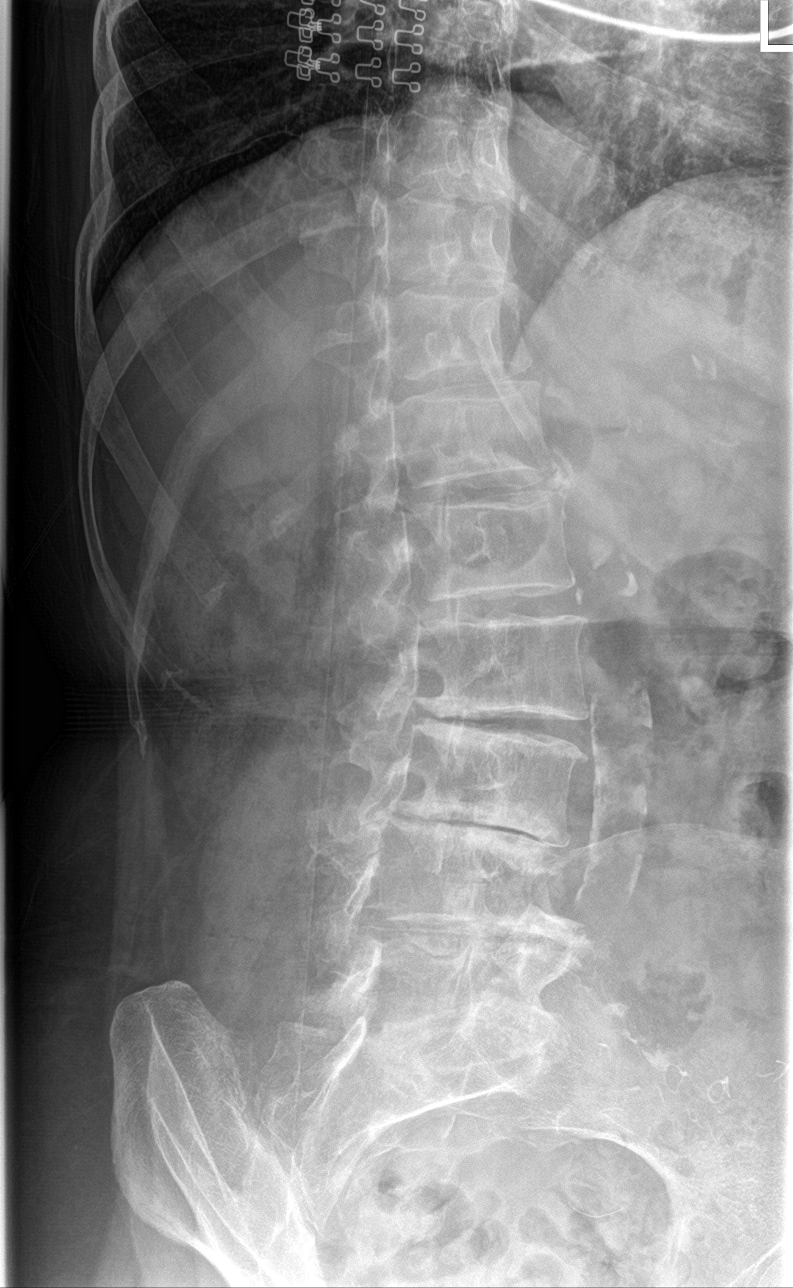

[l-spine lat]
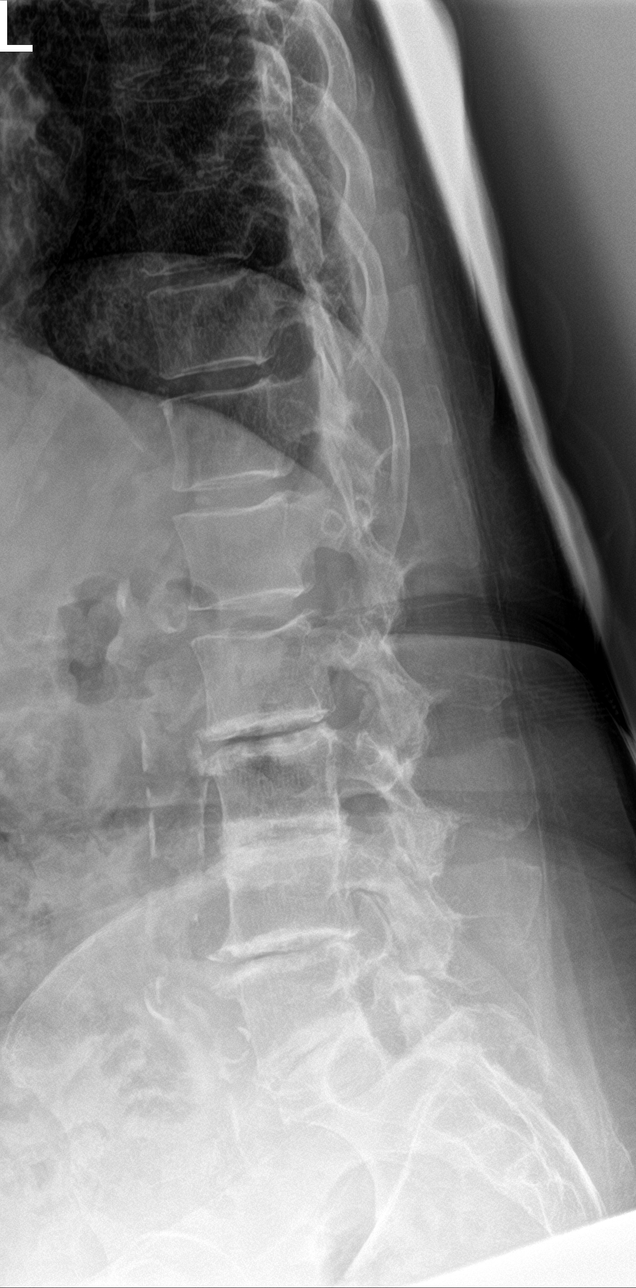

[l-spine spot]
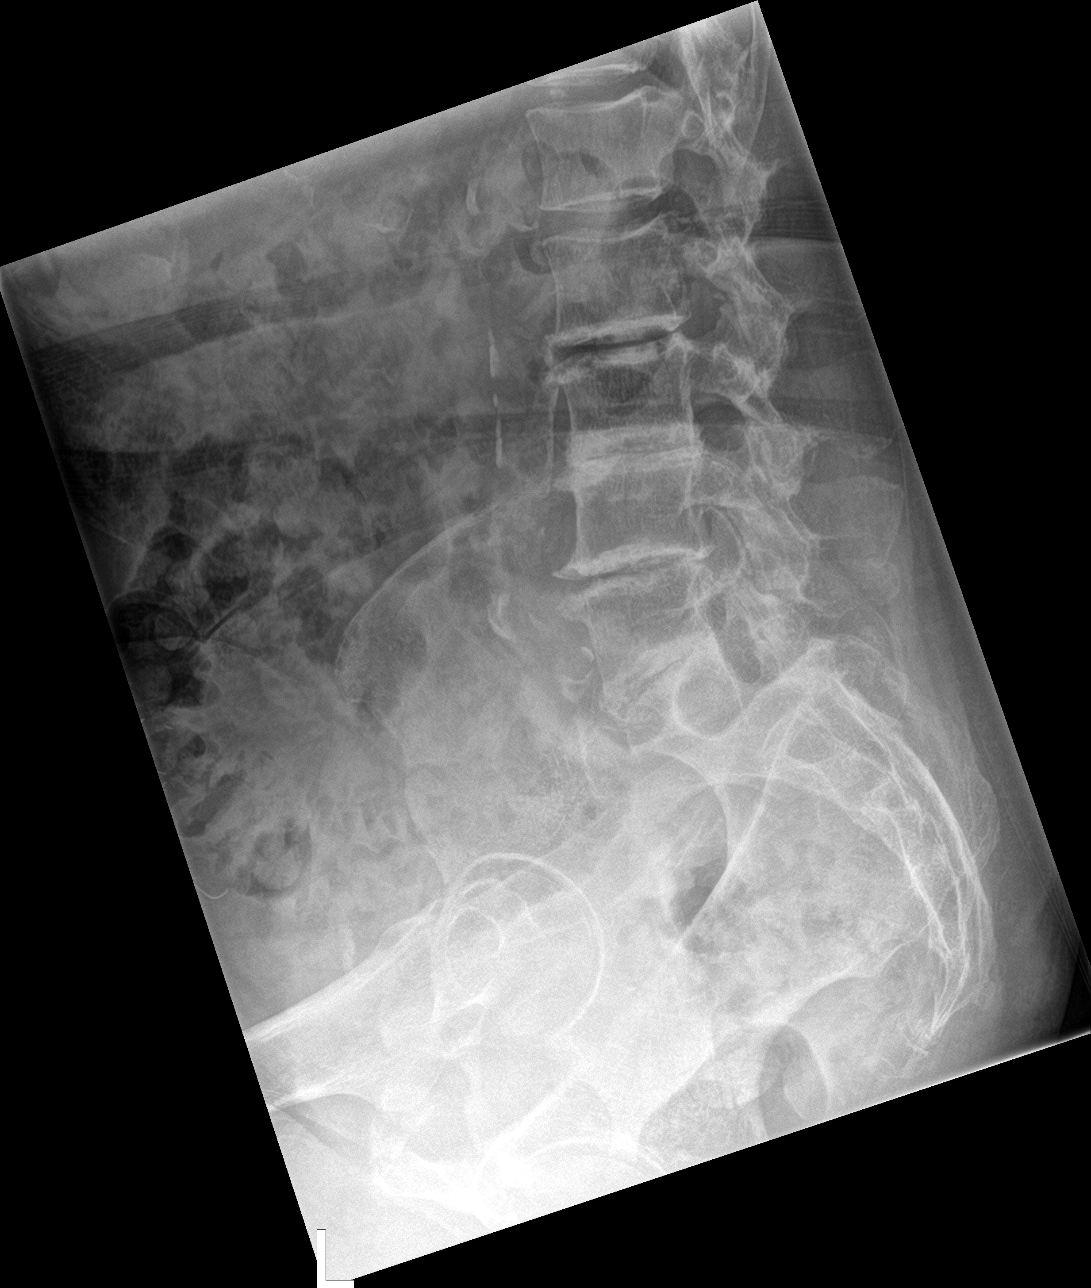

[5 of 5 positions shown; findings below may reference images not displayed]

FINDINGS: Five lumbar type vertebral bodies are well visualized. Vertebral
body height is well maintained. Osteophytic changes are noted as
well as facet hypertrophic change. No anterolisthesis is seen.
Multilevel disc space narrowing is noted accentuated by a scoliotic
curvature stable from the prior study. No soft tissue abnormality is
seen.
IMPRESSION: Degenerative change without acute abnormality.

## 2023-11-07 ENCOUNTER — Other Ambulatory Visit: Payer: Self-pay | Admitting: Family Medicine

## 2023-11-17 NOTE — Telephone Encounter (Signed)
 Medical Buy and Bill  Prior Authorization REQUIRED for [employer]   PA PROCESS DETAILS: PA is required. PA can be initiated by calling [phone] or online at  [email or web address].

## 2023-11-18 DIAGNOSIS — M9901 Segmental and somatic dysfunction of cervical region: Secondary | ICD-10-CM | POA: Diagnosis not present

## 2023-11-18 DIAGNOSIS — M9903 Segmental and somatic dysfunction of lumbar region: Secondary | ICD-10-CM | POA: Diagnosis not present

## 2023-11-18 DIAGNOSIS — M9905 Segmental and somatic dysfunction of pelvic region: Secondary | ICD-10-CM | POA: Diagnosis not present

## 2023-11-18 DIAGNOSIS — M9904 Segmental and somatic dysfunction of sacral region: Secondary | ICD-10-CM | POA: Diagnosis not present

## 2023-11-19 NOTE — Telephone Encounter (Signed)
 Prior Authorization initiated for PROLIA  via [email or web address] KEY: [id number]

## 2023-11-22 NOTE — Telephone Encounter (Signed)
 Medical Buy and Raenette Bumps  Prior Authorization for PROLIA  APPROVED PA# [id number] Valid: 11/06/19-07/12/24

## 2023-11-22 NOTE — Telephone Encounter (Signed)
 Patient is ready for scheduling on or after 11/26/23 BUY AND BILL  Out-of-pocket cost due at time of visit: $40  Primary: [employer] Adv SHP PPO Prolia  co-insurance: $40 Admin fee co-insurance: 0%  Deductible: does not apply  Prior Auth: APPROVED PA# [id number] Valid: 11/06/19-07/12/24  Secondary: N/A Prolia  co-insurance:  Admin fee co-insurance:  Deductible:  Prior Auth:  PA# Valid:   ** This summary of benefits is an estimation of the patient's out-of-pocket cost. Exact cost may vary based on individual plan coverage.

## 2023-11-25 NOTE — Telephone Encounter (Signed)
 LMx1 to schedule Prolia injection.

## 2023-11-26 NOTE — Telephone Encounter (Signed)
 LMx2 to schedule Prolia  injection and follow up with Dr. Aldona Amel.

## 2023-12-03 ENCOUNTER — Ambulatory Visit

## 2023-12-07 ENCOUNTER — Ambulatory Visit

## 2023-12-07 VITALS — BP 120/70 | HR 74 | Ht 61.0 in | Wt 129.0 lb

## 2023-12-07 DIAGNOSIS — M810 Age-related osteoporosis without current pathological fracture: Secondary | ICD-10-CM

## 2023-12-07 MED ORDER — DENOSUMAB 60 MG/ML ~~LOC~~ SOSY
60.0000 mg | PREFILLED_SYRINGE | SUBCUTANEOUS | Status: AC
  Administered 2024-06-23: 60 mg via SUBCUTANEOUS

## 2023-12-07 NOTE — Progress Notes (Signed)
 After obtaining consent, and per orders of Dr. Aldona Amel, injection of Prolia  60mg  given by Charvi Gammage L Adien Kimmel in left arm. Patient instructed to remain in clinic for 20 minutes afterwards, and to report any adverse reaction to me immediately.

## 2023-12-12 ENCOUNTER — Other Ambulatory Visit: Payer: Self-pay | Admitting: Family Medicine

## 2023-12-13 ENCOUNTER — Telehealth: Payer: Self-pay | Admitting: Family Medicine

## 2023-12-13 NOTE — Telephone Encounter (Signed)
 LVM to call back to schedule an appt with Dr Daneil Dunker

## 2023-12-13 NOTE — Telephone Encounter (Signed)
 Patient has an OV on 12/15/2023

## 2023-12-13 NOTE — Telephone Encounter (Unsigned)
 Copied from CRM [id number]. Topic: Clinical - Medication Refill >> Dec 13, 2023 11:28 AM [name] wrote: Medication: amoxicillin -clavulanate (AUGMENTIN ) 875-125 MG tablet  Has the patient contacted their pharmacy? Yes  This is the patient's preferred pharmacy:  [facility] [address], [place] - [address] [place] [address] Phone: [phone] Fax: [phone]  [facility] [address] [place] [address] Phone: [phone] Fax: [phone]  Is this the correct pharmacy for this prescription? Yes If no, delete pharmacy and type the correct one.   Has the prescription been filled recently? No  Is the patient out of the medication? Yes  Has the patient been seen for an appointment in the last year OR does the patient have an upcoming appointment? Yes  Can we respond through MyChart? No  Agent: Please be advised that Rx refills may take up to 3 business days. We ask that you follow-up with your pharmacy.

## 2023-12-13 NOTE — Telephone Encounter (Signed)
 Called patient to confirm which pharmacy she has requested medication refill request to go to. No answer, [facility]MTCB [phone]. Recommend to triage patient due to request is for augmentin  when patient returns call.

## 2023-12-15 ENCOUNTER — Encounter: Payer: Self-pay | Admitting: Family Medicine

## 2023-12-15 ENCOUNTER — Ambulatory Visit: Admitting: Family Medicine

## 2023-12-15 VITALS — BP 136/85 | HR 75 | Temp 97.0°F | Ht 61.0 in | Wt 128.4 lb

## 2023-12-15 DIAGNOSIS — J329 Chronic sinusitis, unspecified: Secondary | ICD-10-CM

## 2023-12-15 DIAGNOSIS — L659 Nonscarring hair loss, unspecified: Secondary | ICD-10-CM

## 2023-12-15 DIAGNOSIS — E538 Deficiency of other specified B group vitamins: Secondary | ICD-10-CM

## 2023-12-15 DIAGNOSIS — E7800 Pure hypercholesterolemia, unspecified: Secondary | ICD-10-CM | POA: Diagnosis not present

## 2023-12-15 LAB — VITAMIN D 25 HYDROXY (VIT D DEFICIENCY, FRACTURES): VITD: 33.03 ng/mL (ref 30.00–100.00)

## 2023-12-15 LAB — LIPID PANEL
Cholesterol: 189 mg/dL (ref 0–200)
HDL: 63.5 mg/dL (ref >39.00)
LDL Cholesterol: 109 mg/dL — ABNORMAL HIGH (ref 0–99)
NonHDL: 125.59
Total CHOL/HDL Ratio: 3
Triglycerides: 83 mg/dL (ref 0.0–149.0)
VLDL: 16.6 mg/dL (ref 0.0–40.0)

## 2023-12-15 LAB — VITAMIN B12: Vitamin B-12: 1367 pg/mL — ABNORMAL HIGH (ref 211–911)

## 2023-12-15 NOTE — Assessment & Plan Note (Signed)
 Check B12 level today per patient request.

## 2023-12-15 NOTE — Assessment & Plan Note (Signed)
 She has noticed more hide also recently.  She is concerned about potential hormonal changes.  Will refer to dermatology for further management.

## 2023-12-15 NOTE — Patient Instructions (Signed)
 It was very nice to see you today!  You have a sinus infection.  Start the Augmentin .  I will refer you to see ENT as well.  We will check blood work today.  I will refer you to see the dermatologist.  Return if symptoms worsen or fail to improve.   Take care, Dr Daneil Dunker  PLEASE NOTE:  If you had any lab tests, please let us  know if you have not heard back within a few days. You may see your results on mychart before we have a chance to review them but we will give you a call once they are reviewed by us .   If we ordered any referrals today, please let us  know if you have not heard from their office within the next week.   If you had any urgent prescriptions sent in today, please check with the pharmacy within an hour of our visit to make sure the prescription was transmitted appropriately.   Please try these tips to maintain a healthy lifestyle:  Eat at least 3 REAL meals and 1-2 snacks per day.  Aim for no more than 5 hours between eating.  If you eat breakfast, please do so within one hour of getting up.   Each meal should contain half fruits/vegetables, one quarter protein, and one quarter carbs (no bigger than a computer mouse)  Cut down on sweet beverages. This includes juice, soda, and sweet tea.   Drink at least 1 glass of water with each meal and aim for at least 8 glasses per day  Exercise at least 150 minutes every week.

## 2023-12-15 NOTE — Assessment & Plan Note (Signed)
 Patient with persistent sinusitis for the last several months.  She has had multiple sinus infections over that time requiring treatment for antibiotics.  Will give another round of Augmentin  today.  She has done well with this previously however will place referral to ENT for further management evaluation.  Potentially has underlying deviated septum though also has a history of nasal sinus polyps in the past which may be contributing to her recurrent sinusitis.  We discussed reasons to return to care ago will defer further management to ENT.

## 2023-12-15 NOTE — Assessment & Plan Note (Signed)
 Patient is longer on Repatha .  She is taking Zetia  and fenofibrate .  Will check labs today.  Depending on results may need to refer her back to cardiology to restart Repatha .

## 2023-12-15 NOTE — Progress Notes (Signed)
   [name] is a 78 y.o. female who presents today for an office visit.  Assessment/Plan:  Chronic Problems Addressed Today: Recurrent sinusitis Patient with persistent sinusitis for the last several months.  She has had multiple sinus infections over that time requiring treatment for antibiotics.  Will give another round of Augmentin  today.  She has done well with this previously however will place referral to ENT for further management evaluation.  Potentially has underlying deviated septum though also has a history of nasal sinus polyps in the past which may be contributing to her recurrent sinusitis.  We discussed reasons to return to care ago will defer further management to ENT.  Hyperlipidemia Patient is longer on Repatha .  She is taking Zetia  and fenofibrate .  Will check labs today.  Depending on results may need to refer her back to cardiology to restart Repatha .  Hair loss She has noticed more hide also recently.  She is concerned about potential hormonal changes.  Will refer to dermatology for further management.  B12 deficiency Check B12 level today per patient request.     Subjective:  HPI:  See assessment / plan for status of chronic conditions. Patient is here today with sinus congestion. This has been going on for several months. We have treated her Augmentin  a few months ago which did temporally help with her symptoms. She has had issues with sinus polyps in the past. She has had some malaise. A lot of facial pressure and difficulty breathing through her nose.        Objective:  Physical Exam: BP 136/85   Pulse 75   Temp (!) 97 F (36.1 C) (Temporal)   Ht 5\' 1"  (1.549 m)   Wt 128 lb 6.4 oz (58.2 kg)   SpO2 97%   BMI 24.26 kg/m   Gen: No acute distress, resting comfortably HEENT: TMs with clear effusion bilaterally.  Nasal mucosa erythematous and boggy bilaterally. CV: Regular rate and rhythm with no murmurs appreciated Pulm: Normal work of breathing,  clear to auscultation bilaterally with no crackles, wheezes, or rhonchi Neuro: Grossly normal, moves all extremities Psych: Normal affect and thought content      [name] M. Daneil Dunker, MD 12/15/2023 10:19 AM

## 2023-12-15 NOTE — Addendum Note (Signed)
 Addended by: Alverda Joe on: 12/15/2023 10:27 AM   Modules accepted: Orders

## 2023-12-16 ENCOUNTER — Telehealth: Payer: Self-pay | Admitting: *Deleted

## 2023-12-16 DIAGNOSIS — M546 Pain in thoracic spine: Secondary | ICD-10-CM | POA: Diagnosis not present

## 2023-12-16 DIAGNOSIS — M545 Low back pain, unspecified: Secondary | ICD-10-CM | POA: Diagnosis not present

## 2023-12-16 DIAGNOSIS — M542 Cervicalgia: Secondary | ICD-10-CM | POA: Diagnosis not present

## 2023-12-16 MED ORDER — AMOXICILLIN-POT CLAVULANATE 875-125 MG PO TABS: 1.0000 | ORAL_TABLET | Freq: Two times a day (BID) | ORAL | 0 refills | Status: AC

## 2023-12-16 NOTE — Telephone Encounter (Signed)
 Copied from CRM [id number]. Topic: Clinical - Prescription Issue >> Dec 16, 2023 10:17 AM Freya Jesus wrote: Reason for CRM: Patient said she has a sinus infection and saw Dr. Daneil Dunker yesterday and he said he would send her a prescription of Augmentin  to the pharmacy.Patient is requesting a call back once it's completed.   Please advise  Madex Seals,RMA

## 2023-12-16 NOTE — Telephone Encounter (Signed)
 Copied from CRM [id number]. Topic: Clinical - Medication Question >> Dec 15, 2023  2:46 PM Adonis Hoot wrote: Reason for CRM: Patient was suppose to have Augmentin  sent to pharmacy for her sinus infection,However the pharmacy doesn't have it for her? Patient would like status of this.   Please advise  Emery Binz,RMA

## 2023-12-17 ENCOUNTER — Ambulatory Visit: Payer: Self-pay | Admitting: Family Medicine

## 2023-12-17 NOTE — Progress Notes (Signed)
 Her LDL is a little bit elevated.  It would be a good idea for her to restart Repatha .  Recommend she call to schedule appointment with cardiology to discuss this further.  The rest of her labs are at goal.  Do not need to make any other changes to her treatment plan at this time.

## 2023-12-21 ENCOUNTER — Other Ambulatory Visit: Payer: Self-pay | Admitting: *Deleted

## 2023-12-21 ENCOUNTER — Ambulatory Visit: Admitting: Family Medicine

## 2023-12-21 DIAGNOSIS — N289 Disorder of kidney and ureter, unspecified: Secondary | ICD-10-CM

## 2023-12-21 NOTE — Telephone Encounter (Signed)
 It is fine for her to use OTC vitamin D  supplements. Recommend starting with 5000 IU daily and we can recheck in 3-6 months.  Ok to place referral to nephrology.  [name]. Daneil Dunker, MD 12/21/2023 7:34 AM

## 2023-12-21 NOTE — Telephone Encounter (Signed)
**Note De-identified  [place] Obfuscation** Please advise 

## 2023-12-28 DIAGNOSIS — M542 Cervicalgia: Secondary | ICD-10-CM | POA: Diagnosis not present

## 2023-12-28 DIAGNOSIS — M545 Low back pain, unspecified: Secondary | ICD-10-CM | POA: Diagnosis not present

## 2023-12-28 DIAGNOSIS — M546 Pain in thoracic spine: Secondary | ICD-10-CM | POA: Diagnosis not present

## 2023-12-29 ENCOUNTER — Encounter (INDEPENDENT_AMBULATORY_CARE_PROVIDER_SITE_OTHER): Payer: Self-pay

## 2023-12-29 NOTE — Telephone Encounter (Signed)
 Last Prolia  inj 12/07/23 Next Prolia  inj due 06/09/24

## 2023-12-30 DIAGNOSIS — M546 Pain in thoracic spine: Secondary | ICD-10-CM | POA: Diagnosis not present

## 2023-12-30 DIAGNOSIS — M542 Cervicalgia: Secondary | ICD-10-CM | POA: Diagnosis not present

## 2023-12-30 DIAGNOSIS — M545 Low back pain, unspecified: Secondary | ICD-10-CM | POA: Diagnosis not present

## 2024-01-09 ENCOUNTER — Other Ambulatory Visit: Payer: Self-pay | Admitting: Family Medicine

## 2024-01-14 DIAGNOSIS — M7918 Myalgia, other site: Secondary | ICD-10-CM | POA: Diagnosis not present

## 2024-01-14 DIAGNOSIS — M545 Low back pain, unspecified: Secondary | ICD-10-CM | POA: Diagnosis not present

## 2024-01-14 DIAGNOSIS — J019 Acute sinusitis, unspecified: Secondary | ICD-10-CM | POA: Diagnosis not present

## 2024-01-26 ENCOUNTER — Ambulatory Visit: Payer: Self-pay | Admitting: *Deleted

## 2024-01-26 NOTE — Telephone Encounter (Signed)
 FYI Only or Action Required?: Action required by provider: clinical question for provider and if sample needed for testing .  Patient was last seen in primary care on 12/15/2023 by Kennyth Worth HERO, MD.  Called Nurse Triage reporting Hematuria.  Symptoms began several days ago.  Interventions attempted: Nothing.  Symptoms are: gradually worsening.  Triage Disposition: See Physician Within 24 Hours  Patient/caregiver understands and will follow disposition?: No, wishes to speak with PCP             Copied from CRM [id number]. Topic: Clinical - Red Word Triage >> Jan 26, 2024 11:04 AM Chiquita SQUIBB wrote: Red Word that prompted transfer to Nurse Triage: Patient is calling in stating she possibly has blood in her urine, she sees the blood when she is wiping but is unsure where it is coming from but would like to check her urine. Reason for Disposition  Age > 50 years  Answer Assessment - Initial Assessment Questions Recommended OV . Patient declined. Patient would like to know if she needs to come by and submit sample. Patient is also calling [facility] specialist. Please advise and can send message via my chart .       1. SEVERITY: How bad is the pain?  (e.g., Scale 1-10; mild, moderate, or severe)     No pain but reports blood noted when wiping after urination. Small dot noted multiple times. 2. FREQUENCY: How many times have you had painful urination today?      None  3. PATTERN: Is pain present every time you urinate or just sometimes?      na 4. ONSET: When did the painful urination start?      Sx started several days ago  5. FEVER: Do you have a fever? If Yes, ask: What is your temperature, how was it measured, and when did it start?     na 6. PAST UTI: Have you had a urine infection before? If Yes, ask: When was the last time? and What happened that time?      yes 7. CAUSE: What do you think is causing the painful urination?  (e.g., UTI,  scratch, Herpes sore)     Not sure concerned due to husband has bladder cancer and never any problems  8. OTHER SYMPTOMS: Do you have any other symptoms? (e.g., blood in urine, flank pain, genital sores, urgency, vaginal discharge)     Blood noted after wiping after urinating. No pain dark colored urine noted  9. PREGNANCY: Is there any chance you are pregnant? When was your last menstrual period?     na  Protocols used: Urination Pain - Female-A-AH

## 2024-02-04 ENCOUNTER — Ambulatory Visit: Admitting: Family Medicine

## 2024-02-04 ENCOUNTER — Encounter: Payer: Self-pay | Admitting: Family Medicine

## 2024-02-04 VITALS — BP 134/80 | HR 79 | Temp 97.3°F | Ht 61.0 in | Wt 127.2 lb

## 2024-02-04 DIAGNOSIS — R82998 Other abnormal findings in urine: Secondary | ICD-10-CM

## 2024-02-04 DIAGNOSIS — M810 Age-related osteoporosis without current pathological fracture: Secondary | ICD-10-CM

## 2024-02-04 DIAGNOSIS — N183 Chronic kidney disease, stage 3 unspecified: Secondary | ICD-10-CM

## 2024-02-04 DIAGNOSIS — F320 Major depressive disorder, single episode, mild: Secondary | ICD-10-CM | POA: Diagnosis not present

## 2024-02-04 LAB — URINALYSIS, ROUTINE W REFLEX MICROSCOPIC
Bilirubin Urine: NEGATIVE
Hgb urine dipstick: NEGATIVE
Ketones, ur: NEGATIVE
Leukocytes,Ua: NEGATIVE
Nitrite: NEGATIVE
Specific Gravity, Urine: 1.025 (ref 1.000–1.030)
Total Protein, Urine: NEGATIVE
Urine Glucose: NEGATIVE
Urobilinogen, UA: 0.2 (ref 0.0–1.0)
pH: 6 (ref 5.0–8.0)

## 2024-02-04 LAB — POCT URINALYSIS DIPSTICK
Bilirubin, UA: NEGATIVE
Blood, UA: NEGATIVE
Glucose, UA: NEGATIVE
Ketones, UA: NEGATIVE
Leukocytes, UA: NEGATIVE
Nitrite, UA: NEGATIVE
Protein, UA: NEGATIVE
Spec Grav, UA: >=1.03 — AB (ref 1.010–1.025)
Urobilinogen, UA: 0.2 U/dL
pH, UA: 5.5 (ref 5.0–8.0)

## 2024-02-04 NOTE — Progress Notes (Signed)
   [name] is a 78 y.o. female who presents today for an office visit.  Assessment/Plan:  New/Acute Problems: Dark urine Her point-of-care urinalysis showed mildly increased specific gravity but otherwise no significant abnormalities.  She is not having any other symptoms.  No signs of UTI.  Will check urine culture and send out urinalysis for further evaluation.  We did discuss importance of staying well hydrated.  We discussed reasons to return to care.  Chronic Problems Addressed Today: Depression Patient has been under quite a bit more stress recently due to her husband's recent bladder cancer diagnosis.  He is now on hospice.  Things are currently challenging.  She does have good support with her family, church community, and hospice.  She is currently on Prozac  60 mg daily.  She will let us  know if she needs any further help with this.  Stage 3 chronic kidney disease We discussed most recent labs which showed GFR 55.  Per chart review she has had fluctuating GFR between 45 and 60 for the last 10 to 15 years.  Discussed with patient that this is commonly seen with age and that we should continue to monitor going forward however do not need to do any further workup at this point.  Osteoporosis On Prolia  per endocrinology.  Due for DEXA.  Will place this order today.     Subjective:  HPI:  Patient here with dark urine.  She is not having any other symptoms.  No dysuria.  No fevers or chills.  She has been under quite a bit of stress recently due to her husband's recent diagnosis of bladder cancer which is metastatic and he is now on hospice care.  She has not noticed any hematuria.  No back pain.       Objective:  Physical Exam: BP 134/80   Pulse 79   Temp (!) 97.3 F (36.3 C) (Temporal)   Ht 5' 1 (1.549 m)   Wt 127 lb 3.2 oz (57.7 kg)   SpO2 95%   BMI 24.03 kg/m   Gen: No acute distress, resting comfortably Neuro: Grossly normal, moves all extremities Psych:  Normal affect and thought content      Kashayla Ungerer M. Kennyth, MD 02/04/2024 12:21 PM

## 2024-02-04 NOTE — Assessment & Plan Note (Signed)
 Patient has been under quite a bit more stress recently due to her husband's recent bladder cancer diagnosis.  He is now on hospice.  Things are currently challenging.  She does have good support with her family, church community, and hospice.  She is currently on Prozac  60 mg daily.  She will let us  know if she needs any further help with this.

## 2024-02-04 NOTE — Assessment & Plan Note (Signed)
 We discussed most recent labs which showed GFR 55.  Per chart review she has had fluctuating GFR between 45 and 60 for the last 10 to 15 years.  Discussed with patient that this is commonly seen with age and that we should continue to monitor going forward however do not need to do any further workup at this point.

## 2024-02-04 NOTE — Patient Instructions (Addendum)
 It was very nice to see you today!  Your urine sample shows that you may be slightly dehydrated.  We are going to check a couple of extra test to make sure there is nothing else that is going on.  Will also order your bone density scan today.  Return if symptoms worsen or fail to improve.   Take care, Dr Kennyth  PLEASE NOTE:  If you had any lab tests, please let us  know if you have not heard back within a few days. You may see your results on mychart before we have a chance to review them but we will give you a call once they are reviewed by us .   If we ordered any referrals today, please let us  know if you have not heard from their office within the next week.   If you had any urgent prescriptions sent in today, please check with the pharmacy within an hour of our visit to make sure the prescription was transmitted appropriately.   Please try these tips to maintain a healthy lifestyle:  Eat at least 3 REAL meals and 1-2 snacks per day.  Aim for no more than 5 hours between eating.  If you eat breakfast, please do so within one hour of getting up.   Each meal should contain half fruits/vegetables, one quarter protein, and one quarter carbs (no bigger than a computer mouse)  Cut down on sweet beverages. This includes juice, soda, and sweet tea.   Drink at least 1 glass of water with each meal and aim for at least 8 glasses per day  Exercise at least 150 minutes every week.

## 2024-02-04 NOTE — Assessment & Plan Note (Signed)
 On Prolia  per endocrinology.  Due for DEXA.  Will place this order today.

## 2024-02-05 LAB — URINE CULTURE
MICRO NUMBER:: 16746845
Result:: NO GROWTH
SPECIMEN QUALITY:: ADEQUATE

## 2024-02-07 ENCOUNTER — Ambulatory Visit: Payer: Self-pay | Admitting: Family Medicine

## 2024-02-07 NOTE — Progress Notes (Signed)
 Urine was negative.  No signs of UTI.

## 2024-02-08 ENCOUNTER — Other Ambulatory Visit: Payer: Self-pay | Admitting: Family Medicine

## 2024-03-01 ENCOUNTER — Ambulatory Visit (INDEPENDENT_AMBULATORY_CARE_PROVIDER_SITE_OTHER): Admitting: Otolaryngology

## 2024-03-01 ENCOUNTER — Encounter (INDEPENDENT_AMBULATORY_CARE_PROVIDER_SITE_OTHER): Payer: Self-pay | Admitting: Otolaryngology

## 2024-03-01 VITALS — BP 130/84 | HR 80

## 2024-03-01 DIAGNOSIS — J342 Deviated nasal septum: Secondary | ICD-10-CM | POA: Diagnosis not present

## 2024-03-01 DIAGNOSIS — R0981 Nasal congestion: Secondary | ICD-10-CM

## 2024-03-01 DIAGNOSIS — Z87891 Personal history of nicotine dependence: Secondary | ICD-10-CM | POA: Diagnosis not present

## 2024-03-01 DIAGNOSIS — J343 Hypertrophy of nasal turbinates: Secondary | ICD-10-CM

## 2024-03-01 DIAGNOSIS — J310 Chronic rhinitis: Secondary | ICD-10-CM

## 2024-03-01 DIAGNOSIS — J324 Chronic pansinusitis: Secondary | ICD-10-CM

## 2024-03-01 NOTE — Progress Notes (Unsigned)
 CC: Recurrent sinusitis, chronic nasal congestion  HPI:  [name] is a 78 y.o. female who presents today complaining of recurrent sinusitis for more than 1 year.  She was treated with 4 courses of antibiotics.  Her last antibiotic was 1 month ago.  Her symptoms include facial pain and pressure, nasal congestion, and thick nasal drainage.  She previously underwent sinus surgery to remove her sinonasal polyps 40 years ago.  She has a history of environmental allergies.  She is currently on Flonase, Allegra, and Mucinex.  Due to her chronic nasal obstruction, she is a habitual mouth breather.  Past Medical History:  Diagnosis Date   Anxiety    Arthritis    back, neck (03/08/2017)   Asthma in child    Bowel obstruction (HCC)    CAD (coronary artery disease), native coronary artery    Chronic lower back pain    Chronic neck pain    Closed left tibial fracture    Colon polyps    Complication of anesthesia    I was awake during one of my surgeries reports that she felt them start the incision and heard them talking reports happened ~ 35 years ago     Depression    Diverticulosis    Hyperlipidemia    Irritable bowel syndrome    Migraine    used to be severe; now maybe 1/year or 2 (03/08/2017)   Pneumonia    H!N!    Past Surgical History:  Procedure Laterality Date   ANKLE HARDWARE REMOVAL Left 07/27/2017   Hardware removal left ankle, takedown non-union with allograft bone graft, Intramedullary Nail (Left)   APPENDECTOMY     AUGMENTATION MAMMAPLASTY Bilateral    BRACHIOPLASTY Bilateral    Arm Lift    CARDIAC CATHETERIZATION     CHOLECYSTECTOMY OPEN     COLON SURGERY     EXTERNAL FIXATION LEG Left 03/08/2017   Procedure: INTERNAL FIXATION LEFT ANKLE;  Surgeon: Harden Jerona GAILS, MD;  Location: [facility] OR;  Service: Orthopedics;  Laterality: Left;   EYE MUSCLE SURGERY Bilateral X 2   lazy eye; OR twice on each eye; Dr. Neysa   FRACTURE SURGERY     HARDWARE REMOVAL Left  07/27/2017   Procedure: HARDWARE REMOVAL;  Surgeon: Vernetta Lonni GRADE, MD;  Location: [facility] OR;  Service: Orthopedics;  Laterality: Left;   LEFT HEART CATH AND CORONARY ANGIOGRAPHY N/A 10/27/2016   Procedure: Left Heart Cath and Coronary Angiography;  Surgeon: Peter M Swaziland, MD;  Location: [facility] INVASIVE CV LAB;  Service: Cardiovascular;  Laterality: N/A;   LEFT HEART CATHETERIZATION WITH CORONARY ANGIOGRAM N/A 08/16/2014   Procedure: LEFT HEART CATHETERIZATION WITH CORONARY ANGIOGRAM;  Surgeon: Victory LELON Claudene DOUGLAS, MD;  Location: [facility] CATH LAB;  Service: Cardiovascular;  Laterality: N/A;   NASAL SINUS SURGERY     ORIF ANKLE FRACTURE Left 03/08/2017   ORIF ANKLE FRACTURE Left 07/27/2017   Procedure: Hardware removal left ankle, takedown non-union with allograft bone graft, Intramedullary Nail;  Surgeon: Vernetta Lonni GRADE, MD;  Location: [facility] OR;  Service: Orthopedics;  Laterality: Left;   PARTIAL COLECTOMY  1970s   TONSILLECTOMY      Family History  Problem Relation Age of Onset   Heart disease Mother        CHF   Coronary artery disease Mother    Hypertension Mother    Hyperlipidemia Mother    Heart disease Father        CHF   Coronary artery disease Father  Diabetes Sister        with all the complications   Coronary artery disease Sister    Stroke Sister 44       CVA   Peripheral vascular disease Sister    Colon cancer Maternal Grandfather    Healthy Son    Liver disease Son        liver failure    Cancer Neg Hx        breast or colon    Social History:  reports that she quit smoking about 14 years ago. Her smoking use included cigarettes. She started smoking about 55 years ago. She has a 41 pack-year smoking history. She has never used smokeless tobacco. She reports that she does not drink alcohol and does not use drugs.  Allergies:  Allergies  Allergen Reactions   Shellfish-Derived Products Anaphylaxis, Shortness Of Breath and Swelling    Throat swells; Iodine is ok    Lipitor [Atorvastatin ] Nausea Only and Other (See Comments)    Fatigue and Lethargy. Leg, back,Joint, and muscle pain. Per patient My stool was a muddy clay color and I having diarrhea pt states I never want to take another statin again   Crestor [Rosuvastatin Calcium ]     UNSPECIFIED REACTION    Evolocumab  Other (See Comments)    Other reaction(s): rash   Hydrocodone -Acetaminophen  Other (See Comments)   Metoprolol      Other reaction(s): very tired, blurred vision   Pravastatin      fatigue   Prednisone  Other (See Comments)    Other reaction(s): insomnia, shakes   Zetia  [Ezetimibe ]     fatigue   Codeine Nausea And Vomiting    Prior to Admission medications   Medication Sig Start Date End Date Taking? Authorizing Provider  ALPRAZolam  (XANAX ) 0.5 MG tablet Take 1 tablet by mouth twice daily 02/09/24  Yes Parker, Caleb M, MD  ezetimibe  (ZETIA ) 10 MG tablet Take 1 tablet by mouth once daily Patient taking differently: Take 10 mg by mouth 2 (two) times a week. 05/12/23  Yes Kennyth Worth HERO, MD  fenofibrate  (TRICOR ) 48 MG tablet Take 1 tablet by mouth once daily Patient taking differently: Take 48 mg by mouth 2 (two) times a week. 09/02/23  Yes Kennyth Worth HERO, MD  FLUoxetine  (PROZAC ) 20 MG capsule Take 1 capsule (20 mg total) by mouth daily. Take 60mg  total daily 09/09/23  Yes Kennyth Worth HERO, MD  FLUoxetine  (PROZAC ) 40 MG capsule Take 1 capsule by mouth once daily 01/10/24  Yes Parker, Caleb M, MD  mirabegron  ER (MYRBETRIQ ) 50 MG TB24 tablet Take 1 tablet (50 mg total) by mouth daily. Patient taking differently: Take 50 mg by mouth as needed. 11/03/22  Yes Kennyth Worth HERO, MD  montelukast  (SINGULAIR ) 10 MG tablet Take 0.5 tablets (5 mg total) by mouth at bedtime. 06/03/23 03/01/24 Yes Allwardt, Alyssa M, PA-C  Multiple Vitamin (MULTI-VITAMIN) tablet Take by mouth. 04/13/17  Yes [provider]  psyllium (METAMUCIL SMOOTH TEXTURE) 58.6 % powder Take 1 packet by mouth 3 (three) times  daily. 09/16/23  Yes Ellouise, Victoria K, DO  senna-docusate (SENOKOT-S) 8.6-50 MG tablet Take 1 tablet by mouth daily. 09/16/23  Yes Ellouise, Victoria K, DO  traMADol  (ULTRAM ) 50 MG tablet Take by mouth. 10/22/21  Yes [provider]  traZODone  (DESYREL ) 50 MG tablet TAKE 1 TABLET BY MOUTH AT BEDTIME 10/21/23  Yes Kennyth Worth HERO, MD    Blood pressure 130/84, pulse 80, SpO2 94%. Exam: General: Communicates without difficulty, well nourished,  no acute distress. Head: Normocephalic, no evidence injury, no tenderness, facial buttresses intact without stepoff. Face/sinus: No tenderness to palpation and percussion. Facial movement is normal and symmetric. Eyes: PERRL, EOMI. No scleral icterus, conjunctivae clear. Neuro: CN II exam reveals vision grossly intact.  No nystagmus at any point of gaze. Ears: Auricles well formed without lesions.  Ear canals are intact without mass or lesion.  No erythema or edema is appreciated.  The TMs are intact without fluid. Nose: External evaluation reveals normal support and skin without lesions.  Dorsum is intact.  Anterior rhinoscopy reveals congested mucosa over anterior aspect of inferior turbinates and deviated septum.  No purulence noted. Oral:  Oral cavity and oropharynx are intact, symmetric, without erythema or edema.  Mucosa is moist without lesions. Neck: Full range of motion without pain.  There is no significant lymphadenopathy.  No masses palpable.  Thyroid  bed within normal limits to palpation.  Parotid glands and submandibular glands equal bilaterally without mass.  Trachea is midline. Neuro:  CN 2-12 grossly intact.   Procedure:  Flexible Nasal Endoscopy: Description: Risks, benefits, and alternatives of flexible endoscopy were explained to the patient.  Specific mention was made of the risk of throat numbness with difficulty swallowing, possible bleeding from the nose and mouth, and pain from the procedure.  The patient gave oral consent to proceed.   The flexible scope was inserted into the right nasal cavity.  Endoscopy of the interior nasal cavity, superior, inferior, and middle meatus was performed. The sphenoid-ethmoid recess was examined. Edematous mucosa was noted.  No polyp, mass, or lesion was appreciated. Nasal septal deviation noted. Olfactory cleft was clear.  Nasopharynx was clear.  Turbinates were hypertrophied but without mass.  The procedure was repeated on the contralateral side with similar findings.  The patient tolerated the procedure well.   Assessment: 1.  Recurrent rhinosinusitis, with nasal mucosal congestion, nasal septal deviation, and bilateral inferior turbinate hypertrophy. 2.  More than 95% of her nasal passageways are obstructed bilaterally.  Plan: 1.  The physical exam and nasal endoscopy findings are reviewed with the patient. 2.  Continue with Flonase, Mucinex, and Allegra daily.  Nasal saline irrigation is encouraged. 3.  Sinus CT scan to evaluate for chronic rhinosinusitis. 4.  The patient will return for reevaluation after her sinus CT scan.  Timoteo Carreiro W Jamarion [place] 03/01/2024, 3:16 PM

## 2024-03-03 DIAGNOSIS — J342 Deviated nasal septum: Secondary | ICD-10-CM | POA: Insufficient documentation

## 2024-03-03 DIAGNOSIS — J343 Hypertrophy of nasal turbinates: Secondary | ICD-10-CM | POA: Insufficient documentation

## 2024-03-03 DIAGNOSIS — J324 Chronic pansinusitis: Secondary | ICD-10-CM | POA: Insufficient documentation

## 2024-03-15 ENCOUNTER — Other Ambulatory Visit: Payer: Self-pay | Admitting: Family Medicine

## 2024-03-16 DIAGNOSIS — M545 Low back pain, unspecified: Secondary | ICD-10-CM | POA: Diagnosis not present

## 2024-03-16 DIAGNOSIS — M62830 Muscle spasm of back: Secondary | ICD-10-CM | POA: Diagnosis not present

## 2024-03-17 ENCOUNTER — Ambulatory Visit (HOSPITAL_COMMUNITY)
Admission: RE | Admit: 2024-03-17 | Discharge: 2024-03-17 | Disposition: A | Discharge status: Home / Self Care | Source: Ambulatory Visit | Attending: Otolaryngology | Admitting: Otolaryngology

## 2024-03-17 DIAGNOSIS — J324 Chronic pansinusitis: Secondary | ICD-10-CM | POA: Diagnosis not present

## 2024-03-17 DIAGNOSIS — J320 Chronic maxillary sinusitis: Secondary | ICD-10-CM | POA: Diagnosis not present

## 2024-03-30 ENCOUNTER — Ambulatory Visit (INDEPENDENT_AMBULATORY_CARE_PROVIDER_SITE_OTHER): Admitting: Otolaryngology

## 2024-03-30 ENCOUNTER — Encounter (INDEPENDENT_AMBULATORY_CARE_PROVIDER_SITE_OTHER): Payer: Self-pay | Admitting: Otolaryngology

## 2024-03-30 VITALS — BP 137/81 | HR 82 | Temp 97.7°F

## 2024-03-30 DIAGNOSIS — J343 Hypertrophy of nasal turbinates: Secondary | ICD-10-CM | POA: Diagnosis not present

## 2024-03-30 DIAGNOSIS — J329 Chronic sinusitis, unspecified: Secondary | ICD-10-CM

## 2024-03-30 DIAGNOSIS — J342 Deviated nasal septum: Secondary | ICD-10-CM

## 2024-03-30 DIAGNOSIS — R0981 Nasal congestion: Secondary | ICD-10-CM

## 2024-03-30 DIAGNOSIS — J310 Chronic rhinitis: Secondary | ICD-10-CM

## 2024-03-30 MED ORDER — FLUTICASONE PROPIONATE 50 MCG/ACT NA SUSP: 2.0000 | Freq: Every day | NASAL | 10 refills | Status: AC

## 2024-03-30 MED ORDER — FEXOFENADINE-PSEUDOEPHED ER 180-240 MG PO TB24: 1.0000 | ORAL_TABLET | Freq: Every day | ORAL | 2 refills | Status: AC

## 2024-04-02 NOTE — Progress Notes (Signed)
 Patient ID: [name], female   DOB: Oct 28, 1945, 78 y.o.   MRN: [id number]  Follow-up: Chronic nasal congestion, recurrent sinusitis  HPI: The patient is a 78 year old female who returns today for her follow-up evaluation.  She was last seen 1 month ago.  At that time, she was complaining of chronic nasal congestion and recurrent sinusitis.  She was treated with 4 courses of antibiotics.  At her last visit, she was noted to have nasal mucosal congestion, nasal septal deviation, and bilateral inferior turbinate hypertrophy.  She was treated with Flonase , Mucinex, Allegra, and nasal saline irrigation.  She also underwent a sinus CT scan.  The CT showed no significant acute or chronic sinusitis.  The patient returns today complaining of persistent nasal congestion.  Currently she denies any facial pain, fever, or visual change.  Exam: General: Communicates without difficulty, well nourished, no acute distress. Head: Normocephalic, no evidence injury, no tenderness, facial buttresses intact without stepoff. Face/sinus: No tenderness to palpation and percussion. Facial movement is normal and symmetric. Eyes: PERRL, EOMI. No scleral icterus, conjunctivae clear. Neuro: CN II exam reveals vision grossly intact.  No nystagmus at any point of gaze. Ears: Auricles well formed without lesions.  Ear canals are intact without mass or lesion.  No erythema or edema is appreciated.  The TMs are intact without fluid. Nose: External evaluation reveals normal support and skin without lesions.  Dorsum is intact.  Anterior rhinoscopy reveals congested mucosa over anterior aspect of inferior turbinates and deviated septum.  No purulence noted. Oral:  Oral cavity and oropharynx are intact, symmetric, without erythema or edema.  Mucosa is moist without lesions. Neck: Full range of motion without pain.  There is no significant lymphadenopathy.  No masses palpable.  Thyroid  bed within normal limits to palpation.  Parotid glands  and submandibular glands equal bilaterally without mass.  Trachea is midline. Neuro:  CN 2-12 grossly intact.   Assessment: 1.  Chronic rhinitis with nasal mucosal congestion, nasal septal deviation, and bilateral inferior turbinate hypertrophy. 2.  History of recurrent rhinosinusitis.  However, no significant acute or chronic sinusitis was noted on her recent CT scan.  Plan: 1.  The physical exam findings and the CT images are extensively discussed with the patient. 2.  The patient is reassured that no significant acute or chronic sinusitis is noted today. 3.  Continue with Flonase , Mucinex, Allegra-D, and nasal saline irrigation. 4.  The treatment options are reviewed.  The options include continuing medical therapy versus surgical intervention with septoplasty and bilateral turbinate reduction.  The risk, benefits, and details of the procedure are discussed. 5.  The patient will return for reevaluation in 4 months, sooner if needed.

## 2024-04-07 ENCOUNTER — Other Ambulatory Visit: Payer: Self-pay | Admitting: Family Medicine

## 2024-05-07 ENCOUNTER — Other Ambulatory Visit: Payer: Self-pay | Admitting: Family Medicine

## 2024-05-08 NOTE — Telephone Encounter (Signed)
 Last visit 02/04/2024 Last refill 03/19/2024    #60 with 0 Refills

## 2024-05-15 NOTE — Telephone Encounter (Signed)
 Prolia  VOB initiated via [email or web address]  Next Prolia  inj DUE: 06/09/24

## 2024-05-29 NOTE — Telephone Encounter (Signed)
 [employer] - Prior Authorization REQUIRED for [employer]

## 2024-05-31 NOTE — Telephone Encounter (Signed)
 [employer]  Prior Authorization on file and valid PA# [id number] Valid: 11/06/19-07/12/24       Prior Auth renewal initiated via [email or web address] KEY: [id number]

## 2024-06-07 NOTE — Telephone Encounter (Signed)
 [employer]  Patient is ready for scheduling on or after 06/09/24  Out-of-pocket cost due at time of visit: $40  Primary: [employer]vantage  [place] [employer] PPO Prolia  co-insurance: $40 Admin fee co-insurance: 0%  Deductible: does not apply  Prior Auth: APPROVED PA# [id number] Valid: 11/06/19-07/12/24  Secondary: N/A Prolia  co-insurance:  Admin fee co-insurance:  Deductible:  Prior Auth:  PA# Valid:   ** This summary of benefits is an estimation of the patient's out-of-pocket cost. Exact cost may vary based on individual plan coverage.

## 2024-06-23 ENCOUNTER — Ambulatory Visit

## 2024-06-23 VITALS — BP 126/80 | HR 78 | Resp 16 | Ht 61.0 in | Wt 128.0 lb

## 2024-06-23 DIAGNOSIS — M810 Age-related osteoporosis without current pathological fracture: Secondary | ICD-10-CM | POA: Diagnosis not present

## 2024-06-23 MED ORDER — DENOSUMAB 60 MG/ML ~~LOC~~ SOSY: 60.0000 mg | PREFILLED_SYRINGE | Freq: Once | SUBCUTANEOUS | Status: AC

## 2024-06-23 NOTE — Progress Notes (Signed)
 After obtaining consent, and per orders of Dr. Elvera Lennox, injection of Prolia given by Tera Partridge. Patient instructed to remain in clinic for 20 minutes afterwards, and to report any adverse reaction to me immediately.

## 2024-06-27 ENCOUNTER — Other Ambulatory Visit: Payer: Self-pay | Admitting: Family Medicine

## 2024-06-29 ENCOUNTER — Other Ambulatory Visit: Payer: Self-pay | Admitting: Family Medicine

## 2024-07-05 ENCOUNTER — Other Ambulatory Visit: Payer: Self-pay | Admitting: Family Medicine

## 2024-07-07 NOTE — Telephone Encounter (Signed)
 Last Prolia  inj 06/23/24 Next Prolia  inj due 12/23/24
